# Patient Record
Sex: Male | Born: 1940
Health system: Southern US, Community
[De-identification: ages and names within clinical notes are randomized; demographics above are authoritative.]

## PROBLEM LIST (undated history)

## (undated) DIAGNOSIS — J189 Pneumonia, unspecified organism: Secondary | ICD-10-CM

## (undated) DIAGNOSIS — R270 Ataxia, unspecified: Secondary | ICD-10-CM

## (undated) DIAGNOSIS — K635 Polyp of colon: Secondary | ICD-10-CM

## (undated) DIAGNOSIS — M1990 Unspecified osteoarthritis, unspecified site: Secondary | ICD-10-CM

## (undated) DIAGNOSIS — R42 Dizziness and giddiness: Secondary | ICD-10-CM

## (undated) DIAGNOSIS — M1120 Other chondrocalcinosis, unspecified site: Secondary | ICD-10-CM

## (undated) DIAGNOSIS — K219 Gastro-esophageal reflux disease without esophagitis: Secondary | ICD-10-CM

## (undated) DIAGNOSIS — I2510 Atherosclerotic heart disease of native coronary artery without angina pectoris: Secondary | ICD-10-CM

## (undated) DIAGNOSIS — I10 Essential (primary) hypertension: Secondary | ICD-10-CM

## (undated) DIAGNOSIS — E7800 Pure hypercholesterolemia, unspecified: Secondary | ICD-10-CM

## (undated) DIAGNOSIS — R251 Tremor, unspecified: Secondary | ICD-10-CM

## (undated) DIAGNOSIS — G629 Polyneuropathy, unspecified: Secondary | ICD-10-CM

## (undated) DIAGNOSIS — I208 Other forms of angina pectoris: Secondary | ICD-10-CM

## (undated) DIAGNOSIS — C4491 Basal cell carcinoma of skin, unspecified: Secondary | ICD-10-CM

## (undated) DIAGNOSIS — G4730 Sleep apnea, unspecified: Secondary | ICD-10-CM

## (undated) DIAGNOSIS — I209 Angina pectoris, unspecified: Secondary | ICD-10-CM

## (undated) DIAGNOSIS — Z9289 Personal history of other medical treatment: Secondary | ICD-10-CM

## (undated) DIAGNOSIS — Z8719 Personal history of other diseases of the digestive system: Secondary | ICD-10-CM

## (undated) DIAGNOSIS — E119 Type 2 diabetes mellitus without complications: Secondary | ICD-10-CM

## (undated) HISTORY — PX: BACK SURGERY: SHX140

## (undated) HISTORY — DX: Personal history of other medical treatment: Z92.89

## (undated) HISTORY — PX: ELBOW SURGERY: SHX618

## (undated) HISTORY — PX: CATARACT EXTRACTION W/ INTRAOCULAR LENS IMPLANT: SHX1309

## (undated) HISTORY — PX: TRIGGER FINGER RELEASE: SHX641

## (undated) HISTORY — DX: Dizziness and giddiness: R42

## (undated) HISTORY — PX: CARPAL TUNNEL RELEASE: SHX101

## (undated) HISTORY — PX: EYE SURGERY: SHX253

## (undated) HISTORY — PX: BASAL CELL CARCINOMA EXCISION: SHX1214

## (undated) HISTORY — DX: Ataxia, unspecified: R27.0

## (undated) HISTORY — PX: COLONOSCOPY W/ POLYPECTOMY: SHX1380

---

## 1962-08-09 HISTORY — PX: POSTERIOR LUMBAR FUSION: SHX6036

## 1977-08-09 HISTORY — PX: NASAL SEPTUM SURGERY: SHX37

## 1998-06-23 ENCOUNTER — Ambulatory Visit (HOSPITAL_COMMUNITY): Admission: RE | Admit: 1998-06-23 | Discharge: 1998-06-23 | Payer: Self-pay | Admitting: Gastroenterology

## 1998-10-06 ENCOUNTER — Encounter: Admission: RE | Admit: 1998-10-06 | Discharge: 1999-01-04 | Payer: Self-pay | Admitting: Internal Medicine

## 1999-01-29 ENCOUNTER — Encounter: Admission: RE | Admit: 1999-01-29 | Discharge: 1999-04-29 | Payer: Self-pay | Admitting: Internal Medicine

## 2000-05-31 ENCOUNTER — Encounter: Admission: RE | Admit: 2000-05-31 | Discharge: 2000-07-07 | Payer: Self-pay | Admitting: Orthopedic Surgery

## 2002-08-15 ENCOUNTER — Encounter (HOSPITAL_BASED_OUTPATIENT_CLINIC_OR_DEPARTMENT_OTHER): Admission: RE | Admit: 2002-08-15 | Discharge: 2002-11-13 | Payer: Self-pay | Admitting: Internal Medicine

## 2003-09-04 ENCOUNTER — Ambulatory Visit (HOSPITAL_COMMUNITY): Admission: RE | Admit: 2003-09-04 | Discharge: 2003-09-04 | Payer: Self-pay | Admitting: Gastroenterology

## 2003-09-04 ENCOUNTER — Encounter (INDEPENDENT_AMBULATORY_CARE_PROVIDER_SITE_OTHER): Payer: Self-pay | Admitting: *Deleted

## 2005-02-06 HISTORY — PX: CARDIAC CATHETERIZATION: SHX172

## 2005-03-03 ENCOUNTER — Ambulatory Visit (HOSPITAL_COMMUNITY): Admission: RE | Admit: 2005-03-03 | Discharge: 2005-03-03 | Payer: Self-pay | Admitting: *Deleted

## 2005-06-14 ENCOUNTER — Encounter: Admission: RE | Admit: 2005-06-14 | Discharge: 2005-06-14 | Payer: Self-pay | Admitting: Internal Medicine

## 2006-07-13 ENCOUNTER — Ambulatory Visit: Payer: Self-pay | Admitting: Oncology

## 2006-07-15 ENCOUNTER — Ambulatory Visit: Payer: Self-pay | Admitting: Oncology

## 2006-07-18 LAB — MORPHOLOGY - CHCC SATELLITE: PLT EST ~~LOC~~: ADEQUATE

## 2006-07-18 LAB — CBC WITH DIFFERENTIAL/PLATELET
BASO%: 0.4 % (ref 0.0–2.0)
Basophils Absolute: 0 10*3/uL (ref 0.0–0.1)
EOS%: 1.3 % (ref 0.0–7.0)
Eosinophils Absolute: 0.1 10*3/uL (ref 0.0–0.5)
HCT: 54.3 % — ABNORMAL HIGH (ref 38.7–49.9)
HGB: 17.9 g/dL — ABNORMAL HIGH (ref 13.0–17.1)
LYMPH%: 22.4 % (ref 14.0–48.0)
MCH: 26.8 pg — ABNORMAL LOW (ref 28.0–33.4)
MCHC: 32.9 g/dL (ref 32.0–35.9)
MCV: 81.6 fL (ref 81.6–98.0)
MONO#: 0.5 10*3/uL (ref 0.1–0.9)
MONO%: 8.2 % (ref 0.0–13.0)
NEUT#: 4.4 10*3/uL (ref 1.5–6.5)
NEUT%: 67.7 % (ref 40.0–75.0)
Platelets: 194 10*3/uL (ref 145–400)
RBC: 6.65 10*6/uL — ABNORMAL HIGH (ref 4.20–5.71)
RDW: 17.2 % — ABNORMAL HIGH (ref 11.2–14.6)
WBC: 6.5 10*3/uL (ref 4.0–10.0)
lymph#: 1.4 10*3/uL (ref 0.9–3.3)

## 2006-07-21 ENCOUNTER — Ambulatory Visit (HOSPITAL_COMMUNITY): Admission: RE | Admit: 2006-07-21 | Discharge: 2006-07-21 | Payer: Self-pay | Admitting: Oncology

## 2006-07-22 LAB — COMPREHENSIVE METABOLIC PANEL
ALT: 28 U/L (ref 0–53)
AST: 32 U/L (ref 0–37)
Albumin: 4.7 g/dL (ref 3.5–5.2)
Alkaline Phosphatase: 58 U/L (ref 39–117)
BUN: 15 mg/dL (ref 6–23)
CO2: 21 mEq/L (ref 19–32)
Calcium: 9.6 mg/dL (ref 8.4–10.5)
Chloride: 97 mEq/L (ref 96–112)
Creatinine, Ser: 1.07 mg/dL (ref 0.40–1.50)
Glucose, Bld: 176 mg/dL — ABNORMAL HIGH (ref 70–99)
Potassium: 4.3 mEq/L (ref 3.5–5.3)
Sodium: 134 mEq/L — ABNORMAL LOW (ref 135–145)
Total Bilirubin: 0.8 mg/dL (ref 0.3–1.2)
Total Protein: 8.4 g/dL — ABNORMAL HIGH (ref 6.0–8.3)

## 2006-07-22 LAB — FERRITIN: Ferritin: 23 ng/mL (ref 22–322)

## 2006-07-22 LAB — IRON AND TIBC
%SAT: 20 % (ref 20–55)
Iron: 100 ug/dL (ref 42–165)
TIBC: 504 ug/dL — ABNORMAL HIGH (ref 215–435)
UIBC: 404 ug/dL

## 2006-07-22 LAB — RETICULOCYTES (CHCC)
ABS Retic: 60.5 10*3/uL (ref 19.0–186.0)
RBC.: 6.72 MIL/uL — ABNORMAL HIGH (ref 4.20–5.50)
Retic Ct Pct: 0.9 % (ref 0.4–3.1)

## 2006-07-22 LAB — HEMOCHROMATOSIS DNA-PCR(C282Y,H63D)

## 2006-07-22 LAB — LACTATE DEHYDROGENASE: LDH: 216 U/L (ref 94–250)

## 2006-07-22 LAB — VITAMIN B12: Vitamin B-12: 353 pg/mL (ref 211–911)

## 2006-07-22 LAB — ERYTHROPOIETIN: Erythropoietin: 7.9 m[IU]/mL (ref 2.6–34.0)

## 2006-07-22 LAB — LEUKOCYTE ALKALINE PHOS: Leukocyte Alkaline  Phos Stain: 127 — ABNORMAL HIGH (ref 22–124)

## 2006-07-28 LAB — CBC WITH DIFFERENTIAL (CANCER CENTER ONLY)
BASO#: 0 10*3/uL (ref 0.0–0.2)
BASO%: 0.7 % (ref 0.0–2.0)
EOS%: 2.1 % (ref 0.0–7.0)
Eosinophils Absolute: 0.1 10*3/uL (ref 0.0–0.5)
HCT: 52.9 % — ABNORMAL HIGH (ref 38.7–49.9)
HGB: 17.4 g/dL — ABNORMAL HIGH (ref 13.0–17.1)
LYMPH#: 1.8 10*3/uL (ref 0.9–3.3)
LYMPH%: 31.6 % (ref 14.0–48.0)
MCH: 27.2 pg — ABNORMAL LOW (ref 28.0–33.4)
MCHC: 32.8 g/dL (ref 32.0–35.9)
MCV: 83 fL (ref 82–98)
MONO#: 0.4 10*3/uL (ref 0.1–0.9)
MONO%: 7.2 % (ref 0.0–13.0)
NEUT#: 3.3 10*3/uL (ref 1.5–6.5)
NEUT%: 58.4 % (ref 40.0–80.0)
Platelets: 196 10*3/uL (ref 145–400)
RBC: 6.37 10*6/uL — ABNORMAL HIGH (ref 4.20–5.70)
RDW: 14.5 % (ref 10.5–14.6)
WBC: 5.7 10*3/uL (ref 4.0–10.0)

## 2006-07-29 LAB — HEMOGLOBINOPATHY EVALUATION
Hemoglobin Other: 0 % (ref 0.0–0.0)
Hgb A2 Quant: 2.4 % (ref 2.2–3.2)
Hgb A: 97.6 % (ref 96.8–97.8)
Hgb F Quant: 0 % (ref 0.0–0.5)
Hgb S Quant: 0 % (ref 0.0–0.0)

## 2006-08-30 ENCOUNTER — Ambulatory Visit (HOSPITAL_COMMUNITY): Admission: RE | Admit: 2006-08-30 | Discharge: 2006-08-30 | Payer: Self-pay | Admitting: Oncology

## 2006-08-30 ENCOUNTER — Encounter (INDEPENDENT_AMBULATORY_CARE_PROVIDER_SITE_OTHER): Payer: Self-pay | Admitting: Specialist

## 2006-08-31 ENCOUNTER — Ambulatory Visit: Payer: Self-pay | Admitting: Oncology

## 2006-09-01 LAB — CBC WITH DIFFERENTIAL (CANCER CENTER ONLY)
BASO#: 0.1 10*3/uL (ref 0.0–0.2)
BASO%: 0.9 % (ref 0.0–2.0)
EOS%: 2 % (ref 0.0–7.0)
Eosinophils Absolute: 0.1 10*3/uL (ref 0.0–0.5)
HCT: 50.6 % — ABNORMAL HIGH (ref 38.7–49.9)
HGB: 16.6 g/dL (ref 13.0–17.1)
LYMPH#: 1.7 10*3/uL (ref 0.9–3.3)
LYMPH%: 32.4 % (ref 14.0–48.0)
MCH: 27.5 pg — ABNORMAL LOW (ref 28.0–33.4)
MCHC: 32.7 g/dL (ref 32.0–35.9)
MCV: 84 fL (ref 82–98)
MONO#: 0.4 10*3/uL (ref 0.1–0.9)
MONO%: 8.1 % (ref 0.0–13.0)
NEUT#: 2.9 10*3/uL (ref 1.5–6.5)
NEUT%: 56.6 % (ref 40.0–80.0)
Platelets: 142 10*3/uL — ABNORMAL LOW (ref 145–400)
RBC: 6.02 10*6/uL — ABNORMAL HIGH (ref 4.20–5.70)
RDW: 15.5 % — ABNORMAL HIGH (ref 10.5–14.6)
WBC: 5.2 10*3/uL (ref 4.0–10.0)

## 2006-10-06 LAB — CBC WITH DIFFERENTIAL (CANCER CENTER ONLY)
BASO#: 0 10*3/uL (ref 0.0–0.2)
BASO%: 0.5 % (ref 0.0–2.0)
EOS%: 3.1 % (ref 0.0–7.0)
Eosinophils Absolute: 0.2 10*3/uL (ref 0.0–0.5)
HCT: 49.6 % (ref 38.7–49.9)
HGB: 16.2 g/dL (ref 13.0–17.1)
LYMPH#: 1.6 10*3/uL (ref 0.9–3.3)
LYMPH%: 27.7 % (ref 14.0–48.0)
MCH: 27.3 pg — ABNORMAL LOW (ref 28.0–33.4)
MCHC: 32.7 g/dL (ref 32.0–35.9)
MCV: 83 fL (ref 82–98)
MONO#: 0.4 10*3/uL (ref 0.1–0.9)
MONO%: 7 % (ref 0.0–13.0)
NEUT#: 3.5 10*3/uL (ref 1.5–6.5)
NEUT%: 61.7 % (ref 40.0–80.0)
Platelets: 193 10*3/uL (ref 145–400)
RBC: 5.95 10*6/uL — ABNORMAL HIGH (ref 4.20–5.70)
RDW: 15.5 % — ABNORMAL HIGH (ref 10.5–14.6)
WBC: 5.7 10*3/uL (ref 4.0–10.0)

## 2006-10-06 LAB — IRON AND TIBC
%SAT: 18 % — ABNORMAL LOW (ref 20–55)
Iron: 78 ug/dL (ref 42–165)
TIBC: 438 ug/dL — ABNORMAL HIGH (ref 215–435)
UIBC: 360 ug/dL

## 2006-10-06 LAB — FERRITIN: Ferritin: 104 ng/mL (ref 22–322)

## 2006-11-01 ENCOUNTER — Ambulatory Visit: Payer: Self-pay | Admitting: Oncology

## 2006-11-01 LAB — CBC WITH DIFFERENTIAL (CANCER CENTER ONLY)
BASO#: 0 10*3/uL (ref 0.0–0.2)
BASO%: 0.7 % (ref 0.0–2.0)
EOS%: 2.7 % (ref 0.0–7.0)
Eosinophils Absolute: 0.2 10*3/uL (ref 0.0–0.5)
HCT: 43.2 % (ref 38.7–49.9)
HGB: 14.2 g/dL (ref 13.0–17.1)
LYMPH#: 1.6 10*3/uL (ref 0.9–3.3)
LYMPH%: 25.5 % (ref 14.0–48.0)
MCH: 27.7 pg — ABNORMAL LOW (ref 28.0–33.4)
MCHC: 32.9 g/dL (ref 32.0–35.9)
MCV: 84 fL (ref 82–98)
MONO#: 0.5 10*3/uL (ref 0.1–0.9)
MONO%: 8 % (ref 0.0–13.0)
NEUT#: 3.9 10*3/uL (ref 1.5–6.5)
NEUT%: 63.1 % (ref 40.0–80.0)
Platelets: 210 10*3/uL (ref 145–400)
RBC: 5.12 10*6/uL (ref 4.20–5.70)
RDW: 15.8 % — ABNORMAL HIGH (ref 10.5–14.6)
WBC: 6.1 10*3/uL (ref 4.0–10.0)

## 2006-11-29 LAB — CBC WITH DIFFERENTIAL (CANCER CENTER ONLY)
BASO#: 0 10*3/uL (ref 0.0–0.2)
BASO%: 0.7 % (ref 0.0–2.0)
EOS%: 2.4 % (ref 0.0–7.0)
Eosinophils Absolute: 0.1 10*3/uL (ref 0.0–0.5)
HCT: 49.3 % (ref 38.7–49.9)
HGB: 16.1 g/dL (ref 13.0–17.1)
LYMPH#: 1.6 10*3/uL (ref 0.9–3.3)
LYMPH%: 28.1 % (ref 14.0–48.0)
MCH: 27.4 pg — ABNORMAL LOW (ref 28.0–33.4)
MCHC: 32.6 g/dL (ref 32.0–35.9)
MCV: 84 fL (ref 82–98)
MONO#: 0.5 10*3/uL (ref 0.1–0.9)
MONO%: 8.1 % (ref 0.0–13.0)
NEUT#: 3.4 10*3/uL (ref 1.5–6.5)
NEUT%: 60.7 % (ref 40.0–80.0)
Platelets: 250 10*3/uL (ref 145–400)
RBC: 5.87 10*6/uL — ABNORMAL HIGH (ref 4.20–5.70)
RDW: 14.1 % (ref 10.5–14.6)
WBC: 5.6 10*3/uL (ref 4.0–10.0)

## 2007-01-23 ENCOUNTER — Ambulatory Visit: Payer: Self-pay | Admitting: Oncology

## 2007-01-24 LAB — CBC WITH DIFFERENTIAL (CANCER CENTER ONLY)
BASO#: 0.1 10*3/uL (ref 0.0–0.2)
BASO%: 0.9 % (ref 0.0–2.0)
EOS%: 2.3 % (ref 0.0–7.0)
Eosinophils Absolute: 0.1 10*3/uL (ref 0.0–0.5)
HCT: 48.2 % (ref 38.7–49.9)
HGB: 15.9 g/dL (ref 13.0–17.1)
LYMPH#: 1.5 10*3/uL (ref 0.9–3.3)
LYMPH%: 27.4 % (ref 14.0–48.0)
MCH: 26.3 pg — ABNORMAL LOW (ref 28.0–33.4)
MCHC: 33 g/dL (ref 32.0–35.9)
MCV: 80 fL — ABNORMAL LOW (ref 82–98)
MONO#: 0.5 10*3/uL (ref 0.1–0.9)
MONO%: 8.5 % (ref 0.0–13.0)
NEUT#: 3.4 10*3/uL (ref 1.5–6.5)
NEUT%: 60.9 % (ref 40.0–80.0)
Platelets: 254 10*3/uL (ref 145–400)
RBC: 6.05 10*6/uL — ABNORMAL HIGH (ref 4.20–5.70)
RDW: 13.6 % (ref 10.5–14.6)
WBC: 5.6 10*3/uL (ref 4.0–10.0)

## 2007-01-24 LAB — IRON AND TIBC
%SAT: 14 % — ABNORMAL LOW (ref 20–55)
Iron: 66 ug/dL (ref 42–165)
TIBC: 481 ug/dL — ABNORMAL HIGH (ref 215–435)
UIBC: 415 ug/dL

## 2007-01-24 LAB — FERRITIN: Ferritin: 42 ng/mL (ref 22–322)

## 2007-04-17 ENCOUNTER — Ambulatory Visit: Payer: Self-pay | Admitting: Oncology

## 2007-11-12 ENCOUNTER — Encounter: Admission: RE | Admit: 2007-11-12 | Discharge: 2007-11-12 | Payer: Self-pay | Admitting: Internal Medicine

## 2008-10-14 ENCOUNTER — Encounter (INDEPENDENT_AMBULATORY_CARE_PROVIDER_SITE_OTHER): Payer: Self-pay | Admitting: Internal Medicine

## 2008-10-16 ENCOUNTER — Ambulatory Visit: Payer: Self-pay | Admitting: *Deleted

## 2008-10-16 DIAGNOSIS — IMO0001 Reserved for inherently not codable concepts without codable children: Secondary | ICD-10-CM | POA: Insufficient documentation

## 2008-10-16 DIAGNOSIS — Z794 Long term (current) use of insulin: Secondary | ICD-10-CM

## 2008-10-16 DIAGNOSIS — E119 Type 2 diabetes mellitus without complications: Secondary | ICD-10-CM

## 2008-10-16 LAB — CONVERTED CEMR LAB: Blood Glucose, Home Monitor: 2 mg/dL

## 2008-10-23 ENCOUNTER — Telehealth (INDEPENDENT_AMBULATORY_CARE_PROVIDER_SITE_OTHER): Payer: Self-pay | Admitting: *Deleted

## 2008-11-07 ENCOUNTER — Ambulatory Visit: Payer: Self-pay | Admitting: Internal Medicine

## 2008-11-27 ENCOUNTER — Telehealth (INDEPENDENT_AMBULATORY_CARE_PROVIDER_SITE_OTHER): Payer: Self-pay | Admitting: *Deleted

## 2008-12-05 ENCOUNTER — Telehealth (INDEPENDENT_AMBULATORY_CARE_PROVIDER_SITE_OTHER): Payer: Self-pay | Admitting: *Deleted

## 2009-01-15 ENCOUNTER — Ambulatory Visit: Payer: Self-pay | Admitting: Internal Medicine

## 2009-01-28 ENCOUNTER — Telehealth (INDEPENDENT_AMBULATORY_CARE_PROVIDER_SITE_OTHER): Payer: Self-pay | Admitting: *Deleted

## 2009-02-18 ENCOUNTER — Telehealth (INDEPENDENT_AMBULATORY_CARE_PROVIDER_SITE_OTHER): Payer: Self-pay | Admitting: *Deleted

## 2009-03-12 ENCOUNTER — Telehealth (INDEPENDENT_AMBULATORY_CARE_PROVIDER_SITE_OTHER): Payer: Self-pay | Admitting: *Deleted

## 2009-03-19 ENCOUNTER — Encounter: Payer: Self-pay | Admitting: Internal Medicine

## 2009-03-19 ENCOUNTER — Ambulatory Visit: Payer: Self-pay | Admitting: Internal Medicine

## 2009-03-24 ENCOUNTER — Telehealth (INDEPENDENT_AMBULATORY_CARE_PROVIDER_SITE_OTHER): Payer: Self-pay | Admitting: *Deleted

## 2009-04-21 ENCOUNTER — Telehealth (INDEPENDENT_AMBULATORY_CARE_PROVIDER_SITE_OTHER): Payer: Self-pay | Admitting: *Deleted

## 2009-05-01 ENCOUNTER — Encounter: Payer: Self-pay | Admitting: Internal Medicine

## 2009-09-18 ENCOUNTER — Encounter: Payer: Self-pay | Admitting: Cardiology

## 2009-10-07 ENCOUNTER — Ambulatory Visit (HOSPITAL_BASED_OUTPATIENT_CLINIC_OR_DEPARTMENT_OTHER): Admission: RE | Admit: 2009-10-07 | Discharge: 2009-10-07 | Payer: Self-pay | Admitting: Orthopedic Surgery

## 2009-10-07 HISTORY — PX: CARPAL TUNNEL RELEASE: SHX101

## 2010-10-28 LAB — BASIC METABOLIC PANEL
BUN: 11 mg/dL (ref 6–23)
CO2: 28 mEq/L (ref 19–32)
Calcium: 10.3 mg/dL (ref 8.4–10.5)
Chloride: 100 mEq/L (ref 96–112)
Creatinine, Ser: 1.04 mg/dL (ref 0.4–1.5)
GFR calc Af Amer: >60 mL/min (ref >60)
GFR calc non Af Amer: >60 mL/min (ref >60)
Glucose, Bld: 126 mg/dL — ABNORMAL HIGH (ref 70–99)
Potassium: 5.3 mEq/L — ABNORMAL HIGH (ref 3.5–5.1)
Sodium: 135 mEq/L (ref 135–145)

## 2010-11-02 LAB — GLUCOSE, CAPILLARY
Glucose-Capillary: 115 mg/dL — ABNORMAL HIGH (ref 70–99)
Glucose-Capillary: 138 mg/dL — ABNORMAL HIGH (ref 70–99)

## 2010-11-02 LAB — POCT HEMOGLOBIN-HEMACUE: Hemoglobin: 15.9 g/dL (ref 13.0–17.0)

## 2010-12-25 NOTE — Cardiovascular Report (Signed)
NAMEREASON, HELZER NO.:  [id number]   MEDICAL RECORD NO.:  [id number]          PATIENT TYPE:  OIB   LOCATION:  2899                         FACILITY:  [facility]   PHYSICIAN:  Elmore Guise., M.D.DATE OF BIRTH:  1940-11-24   DATE OF PROCEDURE:  03/03/2005  DATE OF DISCHARGE:                              CARDIAC CATHETERIZATION   INDICATIONS FOR PROCEDURE:  Abnormal stress test.   DESCRIPTION OF PROCEDURE:  The patient was brought to the cardiac cath lab  after appropriate informed consent. He is prepped and draped in sterile  fashion. Approximately 15 mL of 1% lidocaine was used for local anesthesia.  A 6-French sheath was placed in the right femoral artery without difficulty.  Coronary angiography, LV angiography and limited right femoral angiography  were then performed. The patient tolerated procedure well. He did have  appropriate arteriotomy site for AngioSeal closure device. AngioSeal closure  device was then deployed successfully. There was no bleeding.   FINDINGS:  1.  Left main: Normal.  2.  LAD: Proximal 20% stenosis with diffuse mild mid and distal luminal      irregularities.  3.  Left circumflex: Dominant with no significant disease.  4.  High OM-1: Has mild luminal irregularities moderate-to-large size      vessel.  5.  OM-2: Small vessel with mild luminal irregularities.  6.  OM-3, OM-4: Moderate size vessels with no significant disease.  7.  RCA: Nondominant high branching small vessel with moderate to diffuse      disease.  8.  LV: EF 55-60%, no wall motion abnormalities. LVEDP was 10 mmHg.  9.  Limited right femoral angiogram showed no significant femoral disease,      appropriate arteriotomy site for AngioSeal deployment. AngioSeal closure      device was deployed successfully.   IMPRESSION:  1.  Nonobstructive coronary arteries as described above.  2.  Preserved left ventricular systolic function with an ejection fraction      of  55-60%. No wall motion abnormalities.   PLAN:  Aggressive risk factor modification including blood pressure, lipid,  and diabetic control.       TWK/MEDQ  D:  03/03/2005  T:  03/03/2005  Job:  [id number]   cc:   Cassell Clement, M.D.  [address], [address]  [place]  [place] [address]  Fax: [phone]   Geoffry Paradise, M.D.  [address]  [place]  [place] [address]  Fax: [phone]

## 2010-12-25 NOTE — H&P (Signed)
NAMEALUCARD, FEARNOW NO.:  [id number]   MEDICAL RECORD NO.:  [id number]         PATIENT TYPE:  COIB   LOCATION:                               FACILITY:  [facility]   PHYSICIAN:  Elmore Guise., M.D.DATE OF BIRTH:  1941/04/03   DATE OF ADMISSION:  03/03/2005  DATE OF DISCHARGE:                                HISTORY & PHYSICAL   PRIMARY CARE PHYSICIAN:  Dr. Geoffry Paradise   HISTORY OF PRESENT ILLNESS:  The patient is very a pleasant 70 year old  male, past medical history of diabetes mellitus, dyslipidemia, hypertension,  and family history of heart disease who presents for evaluation of abnormal  stress Cardiolite.  Patient has no significant complaints.  He reports  occasional gas-like sensation in his lower chest area that comes and goes,  but no significant angina.  He stays very active with exercising four to  five days per week with walking and swimming.  He denies any significant  shortness of breath or changes with his exertional tolerance.  He underwent  stress Cardiolite on February 25, 2005 exercising via Bruce protocol for 6  minutes and 30 seconds achieving a peak heart rate of 156 beats per minute  corresponding to 91% of age-predicted maximum.  He did have hypertensive  blood pressure response was systolic blood pressure increasing to 210 and  had moderate dyspnea at peak stress.  He had nonspecific ST-T wave changes,  but no definite ischemic changes.  His EF was 48% and he had mild transient  ischemic make dilatation noted between stress and rest images.  There was no  significant evidence of focal ischemia noted.  However, due to the transient  ischemic dilatation and drop in EF from 57% down to 48% patient now presents  for discussion of invasive workup.  Review of systems is positive for mild  weight gain and occasional palpitations that come and go.  He was recently  stopped from his lisinopril secondary to a lightheaded feeling when he would  stand up.  This is now resolved.  He has no orthopnea, no PND, no  presyncope, and no significant lower extremity edema.  All other review of  systems are negative.   CURRENT MEDICATIONS:  1.  Glucophage 2 g b.i.d.  2.  Lantus 30 units q.h.s.  3.  Lipitor 10 mg daily.  4.  Celebrex 200 mg daily.  5.  Avandia 4 mg daily.  6.  Byetta 10 mg daily.  7.  Lumigan one drip daily.  8.  Azopt eye drops one drip b.i.d.  9.  Testosterone injections q.3 weeks.   ALLERGIES:  MORPHINE causing itching and NAPROSYN causing tremors and  hallucination.   FAMILY HISTORY:  Positive for heart disease in his father at age 47.  His  mother had diabetes.  He has four brothers and five sisters all with  hypertension and diabetes.   SOCIAL HISTORY:  He is married.  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  Exercises  four to five times per week either walking on a treadmill or swimming.  He  smoked off-and-on  for 10 years and quit more than 15 years ago.  No alcohol  and approximately two caffeinated beverages daily.   PAST SURGICAL HISTORY:  Back surgery in 1964, right wrist carpal tunnel  surgery, and a nose surgery back 20-25 years ago.   PHYSICAL EXAMINATION:  VITAL SIGNS:  Weight is 271 pounds, blood pressure is  156/90, heart rate is 80 and regular.  GENERAL:  He is a very pleasant, middle-aged male, alert and oriented x4 in  no acute distress.  HEENT:  Normal.  NECK:  Supple.  No lymphadenopathy.  2+ carotids.  No JVD and no bruits.  LUNGS:  Clear.  HEART:  Regular with a normal S1-S2.  No S3 or S4 noted.  ABDOMEN:  Soft, nontender, nondistended.  EXTREMITIES:  Warm with 2+ pulses and no edema.  He has 2+ pulses in his  carotid, brachial, radial, femoral, pedal.   He his EKG done February 25, 2005 showed normal sinus rhythm, normal axis normal  intervals with no significant ST or T-wave changes   IMPRESSION:  1.  Abnormal stress test with transient ischemic dilatation and hypertensive      blood  pressure response.  2.  Multiple cardiac risk factors including hypertension, dyslipidemia,      diabetes.   PLAN:  We discussed plus/minuses of invasive work-up at length.  Patient  agrees to proceed.  We will check CBC, BMP, and coag panel prior to the  procedure.  I have asked him to stop his Glucophage starting 24 hours prior  to the procedure and then holding for 48 hours after the procedure.  He is  to half his Lantus dose the day prior to the procedure.  Procedure was  scheduled for March 04, 2005 as catheterization with possible intervention if  needed.       TWK/MEDQ  D:  03/01/2005  T:  03/01/2005  Job:  [id number]   cc:   Geoffry Paradise, M.D.  [address]  [place]  [place] [address]  Fax: [phone]

## 2010-12-25 NOTE — Consult Note (Signed)
[facility]  Patient:    Seth Young, Seth Young                       MRN: [id number] Adm. Date:  [id number] Attending:  Garlan Fillers CC:         Richard A. Jacky Kindle, M.D.   Consultation Report  REFERRAL SOURCE:  Self.  PRIMARY PHYSICIAN:  Dr. Geoffry Paradise.  HISTORY OF PRESENT ILLNESS:  The patient is a 70 year old black male who was self-referred for evaluation of occasional numbness in the plantar and lateral surfaces of both feet.  He denies a history of foot ulceration.  He has a history of diabetes mellitus type 2 since 1981, which has not been complicated by retinopathy, peripheral neuropathy, peripheral vascular disease, or coronary artery disease.  He also has a history of glaucoma and plantar fasciitis, for which he wears heel cups.  PAST SURGICAL HISTORY: 1. In 1964, lumbar spine disk surgery. 2. Bilateral carpal tunnel release.  ALLERGIES:  NAPROSYN and MORPHINE SULFATE.  MEDICATIONS PRIOR TO ADMISSION: 1. Glucophage 1000 mg p.o. b.i.d. 2. Zestril 5 mg 1/2 tablet p.o. q.a.m. 3. Vioxx 25 mg p.o. q.d. 4. Advil p.r.n. pain. 5. Lantus 30-35 units subcutaneous q.h.s. 6. Xalatan 1 drop in each eye at bedtime. 7. Viagra 100 mg p.o. q.d. p.r.n. 8. Avandia 1 tablet once daily.  PHYSICAL EXAMINATION:  The temperatures of the feet are 87.3 and 87.9 degrees Fahrenheit along the dorsal aspects of the right and left feet respectively. Temperatures are 83.7 and 83.0 degrees along the plantar aspects of the right and left feet respectively.  The pedal pulses are normal bilaterally.  In addition, the overall shape of both feet is normal.  He has intact sensation to 5.07 weight monofilament pressure.  There is no evidence of interdigital pain in his feet.  In addition, there is no evidence of heavy callus formation or ulceration.  IMPRESSION: 1. Bilateral foot numbness without clear evidence of diabetic peripheral    neuropathy. 2. Diabetes  mellitus type 2, controlled. 3. Glaucoma. 4. Plantar fasciitis. 5. Lichen planus. 6. Degenerative disk disease in the lumbar spine.  PLAN: 1. He was given a prescription to obtain custom inserts for both feet to    better distribute pressure and prevent callus formation.  This may also    help with the numbness that he describes. 2. He was advised to continue regular exercise. 3. He was given videotape and written instructions regarding proper diabetic    foot hygiene. 4. He was advised to apply bag balm to both feet daily to maintain good skin    moisturization. 5. He was advised to call for a follow-up appointment in the foot clinic as    needed in the future. DD:  07/06/00 TD:  07/06/00 Job: [id number]

## 2010-12-25 NOTE — Op Note (Signed)
NAME:  Seth Young, Seth Young                          ACCOUNT NO.:  [id number]   MEDICAL RECORD NO.:  [id number]                   PATIENT TYPE:  AMB   LOCATION:  ENDO                                 FACILITY:  [facility]   PHYSICIAN:  Petra Kuba, M.D.                 DATE OF BIRTH:  [birth year]   DATE OF PROCEDURE:  09/04/2003  DATE OF DISCHARGE:                                 OPERATIVE REPORT   PROCEDURE:  Colonoscopy with hot biopsy.   INDICATIONS FOR PROCEDURE:  Patient with a history of adenomatous polyps due  for repeat screening.  Consent was signed after risks, benefits, methods,  and options were thoroughly discussed in the office.   MEDICATIONS USED:  Demerol 70, Versed 7.   PROCEDURE:  Rectal inspection was pertinent for small external hemorrhoids.  Digital exam was negative.  The video colonoscope was inserted and easily  advanced to the proximal level of the splenic flexure.  At that point, a  sessile polyp along the fold was seen.  We did try to snare it but it kept  sliding off, so we elected to advance to the cecum which did require some  abdominal pressure but no position changes and no other abnormality was seen  on insertion.  The cecum was identified by the appendiceal orifice and the  ileocecal valve.  The prep was adequate.  There was some liquid stool that  required washing and suctioning.  The prep on the left was better than the  right and adequate visualization was had with washing and suctioning.  On  slow withdrawal through the colon, the cecum and the ascending were normal.  In the mid transverse, a tiny polyp was seen which was hot biopsied.  The  scope was slowly withdrawn around the left side of the colon. Initially, we  did not find the polyp that was seen on insertion but when we withdrew back  to about 20 cm, we readvanced up the left side and thought we saw a similar  looking trauma that seemed to have a little trauma on it from probably the  snare so we  were comfortable that this was the same polyp.  Three hot  biopsies were obtained and put in a separate container.  The scope was  readvanced to the proximal level of the hepatic flexure.  No additional  findings were seen in the transverse.  The scope was again withdrawn back to  the rectum.  Once back in the rectum, anorectal pull through and  retroflexion confirmed some hemorrhoids.  No additional findings were seen.  The scope was reinserted a short ways up the left side of the colon, air was  suctioned, the scope was removed.  The patient tolerated the procedure well.  There was no obvious complications.   ENDOSCOPIC DIAGNOSIS:  1. Internal and external hemorrhoids.  2. Small splenic flexure sessile polyp unable to  snare on insertion, hot     biopsied on withdrawal.  3. Tiny mid transverse polyp hot biopsied.  4. Otherwise, within normal limits to the cecum.   PLAN:  Await pathology to determine future colonic screening.  Happy to see  back p.r.n..  Otherwise, return to the care of Dr. Fredia Beets for customary  health care maintenance to include yearly rectals and guaiacs.                                               Petra Kuba, M.D.    MEM/MEDQ  D:  09/04/2003  T:  09/04/2003  Job:  [id number]   cc:   Gerlene Burdock A. Eulis Canner.D.

## 2011-05-10 HISTORY — PX: KNEE ARTHROSCOPY: SHX127

## 2011-10-08 ENCOUNTER — Other Ambulatory Visit: Payer: Self-pay | Admitting: Otolaryngology

## 2011-10-11 ENCOUNTER — Encounter (HOSPITAL_COMMUNITY): Payer: Self-pay | Admitting: Pharmacy Technician

## 2011-10-12 ENCOUNTER — Encounter (HOSPITAL_COMMUNITY): Payer: Self-pay

## 2011-10-12 ENCOUNTER — Encounter (HOSPITAL_COMMUNITY)
Admission: RE | Admit: 2011-10-12 | Discharge: 2011-10-12 | Disposition: A | Discharge status: Home / Self Care | Payer: Medicare Other | Source: Ambulatory Visit | Attending: Specialist | Admitting: Specialist

## 2011-10-12 DIAGNOSIS — K635 Polyp of colon: Secondary | ICD-10-CM

## 2011-10-12 HISTORY — DX: Polyp of colon: K63.5

## 2011-10-12 HISTORY — DX: Sleep apnea, unspecified: G47.30

## 2011-10-12 HISTORY — DX: Unspecified osteoarthritis, unspecified site: M19.90

## 2011-10-12 HISTORY — DX: Personal history of other diseases of the digestive system: Z87.19

## 2011-10-12 HISTORY — DX: Essential (primary) hypertension: I10

## 2011-10-12 LAB — BASIC METABOLIC PANEL
CO2: 26 mEq/L (ref 19–32)
Calcium: 10.3 mg/dL (ref 8.4–10.5)
Glucose, Bld: 142 mg/dL — ABNORMAL HIGH (ref 70–99)
Potassium: 4.5 mEq/L (ref 3.5–5.1)
Sodium: 133 mEq/L — ABNORMAL LOW (ref 135–145)

## 2011-10-12 LAB — CBC
HCT: 45.9 % (ref 39.0–52.0)
Hemoglobin: 15.1 g/dL (ref 13.0–17.0)
MCH: 28.8 pg (ref 26.0–34.0)
RBC: 5.24 MIL/uL (ref 4.22–5.81)

## 2011-10-12 LAB — SURGICAL PCR SCREEN: MRSA, PCR: NEGATIVE

## 2011-10-12 MED ORDER — CHLORHEXIDINE GLUCONATE 4 % EX LIQD
60.0000 mL | Freq: Once | CUTANEOUS | Status: DC
  Filled 2011-10-12: qty 60

## 2011-10-12 NOTE — Pre-Procedure Instructions (Signed)
10-12-11- EKG/ CXR 10'12 reports with chart.

## 2011-10-12 NOTE — Patient Instructions (Signed)
20 DEMARKO ZEIMET  10/12/2011   Your procedure is scheduled on: 10-14-11  Report to [facility] at 0900       AM.  Call this number if you have problems the morning of surgery: [phone]   Remember:   Do not eat food:After Midnight.  May have clear liquids:up to 6 Hours before arrival. Nothing after : 0430 AM, unless drop in blood sugar(use 2-4 ounces of clear liquid with tablespoon of sugar)-report in to Short Stay unit.  Clear liquids include soda, tea, black coffee, apple or grape juice, broth.  Take these medicines the morning of surgery with A SIP OF WATER: Amlodipine, Cymbalta, Lipitor. Bringabd use Cosopt eye drops. No Diabetic meds or insulin Am of .-Lantus (1/2) night time dose -25 units Subcutaneous  10-13-11 , then none AM of.   Do not wear jewelry, make-up or nail polish.  Do not wear lotions, powders, or perfumes. You may wear deodorant.  Do not shave 48 hours prior to surgery.(may shave face and neck0  Do not bring valuables to the hospital.  Contacts, dentures or bridgework may not be worn into surgery.  Leave suitcase in the car. After surgery it may be brought to your room.  For patients admitted to the hospital, checkout time is 11:00 AM the day of discharge.   Patients discharged the day of surgery will not be allowed to drive home.  Name and phone number of your driver: [name]-spouse  Special Instructions: CHG Shower Use Special Wash: 1/2 bottle night before surgery and 1/2 bottle morning of surgery.(avoid face and genitals)   Please read over the following fact sheets that you were given: MRSA Information, Incentive Spirometry Instruction.

## 2011-10-14 ENCOUNTER — Encounter (HOSPITAL_COMMUNITY): Payer: Self-pay | Admitting: Anesthesiology

## 2011-10-14 ENCOUNTER — Encounter (HOSPITAL_COMMUNITY): Payer: Self-pay | Admitting: *Deleted

## 2011-10-14 ENCOUNTER — Encounter (HOSPITAL_COMMUNITY)
Admission: RE | Disposition: A | Discharge status: Home / Self Care | Payer: Self-pay | Source: Ambulatory Visit | Attending: Specialist

## 2011-10-14 ENCOUNTER — Ambulatory Visit (HOSPITAL_COMMUNITY): Payer: Medicare Other | Admitting: Anesthesiology

## 2011-10-14 ENCOUNTER — Observation Stay (HOSPITAL_COMMUNITY)
Admission: RE | Admit: 2011-10-14 | Discharge: 2011-10-17 | Disposition: A | Discharge status: Home / Self Care | Payer: Medicare Other | Source: Ambulatory Visit | Attending: Specialist | Admitting: Specialist

## 2011-10-14 DIAGNOSIS — K449 Diaphragmatic hernia without obstruction or gangrene: Secondary | ICD-10-CM | POA: Insufficient documentation

## 2011-10-14 DIAGNOSIS — S43429A Sprain of unspecified rotator cuff capsule, initial encounter: Principal | ICD-10-CM | POA: Insufficient documentation

## 2011-10-14 DIAGNOSIS — X58XXXA Exposure to other specified factors, initial encounter: Secondary | ICD-10-CM | POA: Insufficient documentation

## 2011-10-14 DIAGNOSIS — G4730 Sleep apnea, unspecified: Secondary | ICD-10-CM | POA: Insufficient documentation

## 2011-10-14 DIAGNOSIS — M75100 Unspecified rotator cuff tear or rupture of unspecified shoulder, not specified as traumatic: Secondary | ICD-10-CM

## 2011-10-14 DIAGNOSIS — M25419 Effusion, unspecified shoulder: Secondary | ICD-10-CM | POA: Insufficient documentation

## 2011-10-14 DIAGNOSIS — I10 Essential (primary) hypertension: Secondary | ICD-10-CM | POA: Insufficient documentation

## 2011-10-14 DIAGNOSIS — Z01812 Encounter for preprocedural laboratory examination: Secondary | ICD-10-CM | POA: Insufficient documentation

## 2011-10-14 DIAGNOSIS — E119 Type 2 diabetes mellitus without complications: Secondary | ICD-10-CM | POA: Insufficient documentation

## 2011-10-14 HISTORY — PX: SUBACROMIAL DECOMPRESSION: SHX5174

## 2011-10-14 HISTORY — PX: SHOULDER OPEN ROTATOR CUFF REPAIR: SHX2407

## 2011-10-14 LAB — GLUCOSE, CAPILLARY
Glucose-Capillary: 148 mg/dL — ABNORMAL HIGH (ref 70–99)
Glucose-Capillary: 156 mg/dL — ABNORMAL HIGH (ref 70–99)

## 2011-10-14 SURGERY — REPAIR, ROTATOR CUFF, OPEN
Anesthesia: General | Site: Shoulder | Laterality: Right | Wound class: Clean

## 2011-10-14 MED ORDER — DOCUSATE SODIUM 100 MG PO CAPS
100.0000 mg | ORAL_CAPSULE | Freq: Two times a day (BID) | ORAL | Status: DC
  Administered 2011-10-14 – 2011-10-17 (×6): 100 mg via ORAL
  Filled 2011-10-14 (×5): qty 1

## 2011-10-14 MED ORDER — TOBRAMYCIN 0.3 % OP SOLN
1.0000 [drp] | Freq: Four times a day (QID) | OPHTHALMIC | Status: DC
  Filled 2011-10-14: qty 5

## 2011-10-14 MED ORDER — BUPIVACAINE-EPINEPHRINE 0.5% -1:200000 IJ SOLN
INTRAMUSCULAR | Status: DC | PRN
  Administered 2011-10-14: 25 mL

## 2011-10-14 MED ORDER — L-METHYLFOLATE-B6-B12 3-35-2 MG PO TABS
1.0000 | ORAL_TABLET | Freq: Every day | ORAL | Status: DC
  Administered 2011-10-15 – 2011-10-17 (×3): 1 via ORAL
  Filled 2011-10-14 (×3): qty 1

## 2011-10-14 MED ORDER — ONDANSETRON HCL 4 MG/2ML IJ SOLN
4.0000 mg | Freq: Four times a day (QID) | INTRAMUSCULAR | Status: DC | PRN
  Filled 2011-10-14: qty 2

## 2011-10-14 MED ORDER — FENTANYL CITRATE 0.05 MG/ML IJ SOLN: 25.0000 ug | INTRAMUSCULAR | Status: DC | PRN

## 2011-10-14 MED ORDER — MEPERIDINE HCL 50 MG/ML IJ SOLN: 6.2500 mg | INTRAMUSCULAR | Status: DC | PRN

## 2011-10-14 MED ORDER — ACETAMINOPHEN 10 MG/ML IV SOLN
INTRAVENOUS | Status: AC
  Filled 2011-10-14: qty 100

## 2011-10-14 MED ORDER — ONDANSETRON HCL 4 MG/2ML IJ SOLN
INTRAMUSCULAR | Status: DC | PRN
  Administered 2011-10-14: 4 mg via INTRAVENOUS

## 2011-10-14 MED ORDER — AMLODIPINE BESYLATE 5 MG PO TABS
5.0000 mg | ORAL_TABLET | Freq: Every day | ORAL | Status: DC
  Administered 2011-10-16 – 2011-10-17 (×2): 5 mg via ORAL
  Filled 2011-10-14 (×3): qty 1

## 2011-10-14 MED ORDER — INSULIN ASPART 100 UNIT/ML ~~LOC~~ SOLN: 0.0000 [IU] | Freq: Every day | SUBCUTANEOUS | Status: DC

## 2011-10-14 MED ORDER — ACETAMINOPHEN 10 MG/ML IV SOLN
INTRAVENOUS | Status: DC | PRN
  Administered 2011-10-14: 1000 mg via INTRAVENOUS

## 2011-10-14 MED ORDER — HYDROMORPHONE HCL PF 1 MG/ML IJ SOLN
0.5000 mg | INTRAMUSCULAR | Status: DC | PRN
  Administered 2011-10-14 – 2011-10-15 (×3): 1 mg via INTRAVENOUS
  Administered 2011-10-15: 0.5 mg via INTRAVENOUS
  Administered 2011-10-15: 1 mg via INTRAVENOUS
  Filled 2011-10-14 (×5): qty 1

## 2011-10-14 MED ORDER — MIDAZOLAM HCL 5 MG/5ML IJ SOLN
INTRAMUSCULAR | Status: DC | PRN
  Administered 2011-10-14: 2 mg via INTRAVENOUS

## 2011-10-14 MED ORDER — SODIUM CHLORIDE 0.9 % IV SOLN
INTRAVENOUS | Status: DC
  Administered 2011-10-14 – 2011-10-15 (×2): via INTRAVENOUS

## 2011-10-14 MED ORDER — DULOXETINE HCL 30 MG PO CPEP
30.0000 mg | ORAL_CAPSULE | Freq: Every day | ORAL | Status: DC
  Administered 2011-10-15 – 2011-10-17 (×3): 30 mg via ORAL
  Filled 2011-10-14 (×3): qty 1

## 2011-10-14 MED ORDER — SODIUM CHLORIDE 0.9 % IR SOLN
Status: DC | PRN
  Administered 2011-10-14: 11:00:00

## 2011-10-14 MED ORDER — INSULIN LISPRO 100 UNIT/ML ~~LOC~~ SOLN: 5.0000 [IU] | Freq: Three times a day (TID) | SUBCUTANEOUS | Status: DC

## 2011-10-14 MED ORDER — METFORMIN HCL 500 MG PO TABS
1000.0000 mg | ORAL_TABLET | Freq: Two times a day (BID) | ORAL | Status: DC
  Administered 2011-10-15 – 2011-10-17 (×5): 1000 mg via ORAL
  Filled 2011-10-14 (×5): qty 2

## 2011-10-14 MED ORDER — PHENYLEPHRINE HCL 10 MG/ML IJ SOLN
10.0000 mg | INTRAVENOUS | Status: DC | PRN
  Administered 2011-10-14: 50 ug/min via INTRAVENOUS

## 2011-10-14 MED ORDER — METOCLOPRAMIDE HCL 10 MG PO TABS
5.0000 mg | ORAL_TABLET | Freq: Three times a day (TID) | ORAL | Status: DC | PRN
  Administered 2011-10-16: 10 mg via ORAL
  Filled 2011-10-14: qty 1

## 2011-10-14 MED ORDER — PHENOL 1.4 % MT LIQD
1.0000 | OROMUCOSAL | Status: DC | PRN
  Filled 2011-10-14: qty 177

## 2011-10-14 MED ORDER — GLYCOPYRROLATE 0.2 MG/ML IJ SOLN
INTRAMUSCULAR | Status: DC | PRN
  Administered 2011-10-14: .7 mg via INTRAVENOUS

## 2011-10-14 MED ORDER — PROPOFOL 10 MG/ML IV EMUL
INTRAVENOUS | Status: DC | PRN
  Administered 2011-10-14: 150 mg via INTRAVENOUS

## 2011-10-14 MED ORDER — LACTATED RINGERS IV SOLN
INTRAVENOUS | Status: DC
  Administered 2011-10-14: 13:00:00 via INTRAVENOUS

## 2011-10-14 MED ORDER — PROMETHAZINE HCL 25 MG/ML IJ SOLN: 6.2500 mg | INTRAMUSCULAR | Status: DC | PRN

## 2011-10-14 MED ORDER — METHOCARBAMOL 100 MG/ML IJ SOLN
500.0000 mg | Freq: Four times a day (QID) | INTRAMUSCULAR | Status: DC | PRN
  Filled 2011-10-14: qty 5

## 2011-10-14 MED ORDER — LIRAGLUTIDE 18 MG/3ML ~~LOC~~ SOLN
1.8000 mg | Freq: Every day | SUBCUTANEOUS | Status: DC
  Administered 2011-10-15 – 2011-10-17 (×3): 1.8 mg via SUBCUTANEOUS

## 2011-10-14 MED ORDER — PREGABALIN 50 MG PO CAPS
50.0000 mg | ORAL_CAPSULE | Freq: Every day | ORAL | Status: DC
  Administered 2011-10-14 – 2011-10-16 (×3): 50 mg via ORAL
  Filled 2011-10-14 (×3): qty 1

## 2011-10-14 MED ORDER — MENTHOL 3 MG MT LOZG
1.0000 | LOZENGE | OROMUCOSAL | Status: DC | PRN
Start: 2011-10-14 — End: 2011-10-17
  Filled 2011-10-14: qty 9

## 2011-10-14 MED ORDER — NEOSTIGMINE METHYLSULFATE 1 MG/ML IJ SOLN
INTRAMUSCULAR | Status: DC | PRN
  Administered 2011-10-14: 5 mg via INTRAVENOUS

## 2011-10-14 MED ORDER — SUCCINYLCHOLINE CHLORIDE 20 MG/ML IJ SOLN
INTRAMUSCULAR | Status: DC | PRN
  Administered 2011-10-14: 100 mg via INTRAVENOUS

## 2011-10-14 MED ORDER — CHLORTHALIDONE 25 MG PO TABS
25.0000 mg | ORAL_TABLET | ORAL | Status: DC
  Administered 2011-10-14 – 2011-10-16 (×2): 25 mg via ORAL
  Filled 2011-10-14 (×2): qty 1

## 2011-10-14 MED ORDER — CEFAZOLIN SODIUM-DEXTROSE 2-3 GM-% IV SOLR
2.0000 g | INTRAVENOUS | Status: AC
  Administered 2011-10-14: 2 g via INTRAVENOUS

## 2011-10-14 MED ORDER — INSULIN ASPART 100 UNIT/ML ~~LOC~~ SOLN
0.0000 [IU] | Freq: Three times a day (TID) | SUBCUTANEOUS | Status: DC
  Administered 2011-10-15: 2 [IU] via SUBCUTANEOUS
  Administered 2011-10-15: 5 [IU] via SUBCUTANEOUS
  Administered 2011-10-15 – 2011-10-16 (×2): 3 [IU] via SUBCUTANEOUS
  Administered 2011-10-16 (×2): 2 [IU] via SUBCUTANEOUS
  Administered 2011-10-17 (×2): 3 [IU] via SUBCUTANEOUS
  Filled 2011-10-14: qty 3

## 2011-10-14 MED ORDER — EPHEDRINE SULFATE 50 MG/ML IJ SOLN
INTRAMUSCULAR | Status: DC | PRN
  Administered 2011-10-14 (×2): 5 mg via INTRAVENOUS

## 2011-10-14 MED ORDER — CEFAZOLIN SODIUM-DEXTROSE 2-3 GM-% IV SOLR
2.0000 g | Freq: Four times a day (QID) | INTRAVENOUS | Status: AC
  Administered 2011-10-14 – 2011-10-15 (×3): 2 g via INTRAVENOUS
  Filled 2011-10-14 (×3): qty 50

## 2011-10-14 MED ORDER — LIDOCAINE HCL (CARDIAC) 20 MG/ML IV SOLN
INTRAVENOUS | Status: DC | PRN
  Administered 2011-10-14: 100 mg via INTRAVENOUS

## 2011-10-14 MED ORDER — INSULIN GLARGINE 100 UNIT/ML ~~LOC~~ SOLN
50.0000 [IU] | Freq: Every day | SUBCUTANEOUS | Status: DC
  Administered 2011-10-14 – 2011-10-16 (×3): 50 [IU] via SUBCUTANEOUS
  Filled 2011-10-14: qty 3

## 2011-10-14 MED ORDER — DORZOLAMIDE HCL-TIMOLOL MAL 2-0.5 % OP SOLN
1.0000 [drp] | Freq: Two times a day (BID) | OPHTHALMIC | Status: DC
  Administered 2011-10-14 – 2011-10-17 (×6): 1 [drp] via OPHTHALMIC
  Filled 2011-10-14: qty 10

## 2011-10-14 MED ORDER — ATORVASTATIN CALCIUM 10 MG PO TABS
10.0000 mg | ORAL_TABLET | Freq: Every day | ORAL | Status: DC
  Administered 2011-10-15 – 2011-10-17 (×3): 10 mg via ORAL
  Filled 2011-10-14 (×3): qty 1

## 2011-10-14 MED ORDER — FENTANYL CITRATE 0.05 MG/ML IJ SOLN
INTRAMUSCULAR | Status: DC | PRN
  Administered 2011-10-14 (×2): 50 ug via INTRAVENOUS

## 2011-10-14 MED ORDER — LISINOPRIL 40 MG PO TABS
40.0000 mg | ORAL_TABLET | Freq: Every day | ORAL | Status: DC
  Administered 2011-10-15 – 2011-10-17 (×3): 40 mg via ORAL
  Filled 2011-10-14 (×3): qty 1

## 2011-10-14 MED ORDER — ACETAMINOPHEN 650 MG RE SUPP: 650.0000 mg | Freq: Four times a day (QID) | RECTAL | Status: DC | PRN

## 2011-10-14 MED ORDER — ONDANSETRON HCL 4 MG PO TABS: 4.0000 mg | ORAL_TABLET | Freq: Four times a day (QID) | ORAL | Status: DC | PRN

## 2011-10-14 MED ORDER — ACETAMINOPHEN 325 MG PO TABS: 650.0000 mg | ORAL_TABLET | Freq: Four times a day (QID) | ORAL | Status: DC | PRN

## 2011-10-14 MED ORDER — HYDROCODONE-ACETAMINOPHEN 7.5-325 MG PO TABS
1.0000 | ORAL_TABLET | ORAL | Status: DC | PRN
  Administered 2011-10-14: 1 via ORAL
  Administered 2011-10-15 – 2011-10-16 (×7): 2 via ORAL
  Administered 2011-10-16: 1 via ORAL
  Administered 2011-10-17 (×2): 2 via ORAL
  Filled 2011-10-14 (×7): qty 2
  Filled 2011-10-14: qty 1
  Filled 2011-10-14 (×3): qty 2

## 2011-10-14 MED ORDER — LACTATED RINGERS IV SOLN
INTRAVENOUS | Status: DC
  Administered 2011-10-14: 10:00:00 via INTRAVENOUS

## 2011-10-14 MED ORDER — ROCURONIUM BROMIDE 100 MG/10ML IV SOLN
INTRAVENOUS | Status: DC | PRN
  Administered 2011-10-14: 10 mg via INTRAVENOUS
  Administered 2011-10-14: 30 mg via INTRAVENOUS

## 2011-10-14 MED ORDER — DIPHENHYDRAMINE HCL 12.5 MG/5ML PO ELIX: 12.5000 mg | ORAL_SOLUTION | ORAL | Status: DC | PRN

## 2011-10-14 MED ORDER — BUPIVACAINE-EPINEPHRINE 0.5% -1:200000 IJ SOLN
INTRAMUSCULAR | Status: AC
  Filled 2011-10-14: qty 1

## 2011-10-14 MED ORDER — METHOCARBAMOL 500 MG PO TABS
500.0000 mg | ORAL_TABLET | Freq: Four times a day (QID) | ORAL | Status: DC | PRN
  Administered 2011-10-14 – 2011-10-17 (×5): 500 mg via ORAL
  Filled 2011-10-14 (×5): qty 1

## 2011-10-14 MED ORDER — INSULIN ASPART 100 UNIT/ML ~~LOC~~ SOLN: 5.0000 [IU] | Freq: Three times a day (TID) | SUBCUTANEOUS | Status: DC

## 2011-10-14 MED ORDER — METOCLOPRAMIDE HCL 5 MG/ML IJ SOLN: 5.0000 mg | Freq: Three times a day (TID) | INTRAMUSCULAR | Status: DC | PRN

## 2011-10-14 MED ORDER — CEFAZOLIN SODIUM-DEXTROSE 2-3 GM-% IV SOLR
INTRAVENOUS | Status: AC
  Filled 2011-10-14: qty 50

## 2011-10-14 SURGICAL SUPPLY — 55 items
ANCH SUT 2 CP-2 ROTR CUF (Anchor) ×2 IMPLANT
ANCH SUT PUSHLCK 24X4.5 STRL (Orthopedic Implant) ×1 IMPLANT
ANCHOR ROTATOR CUFF #2 (Anchor) ×4 IMPLANT
APL SKNCLS STERI-STRIP NONHPOA (GAUZE/BANDAGES/DRESSINGS) ×1
BAG SPEC THK2 15X12 ZIP CLS (MISCELLANEOUS) ×1
BAG ZIPLOCK 12X15 (MISCELLANEOUS) ×2 IMPLANT
BENZOIN TINCTURE PRP APPL 2/3 (GAUZE/BANDAGES/DRESSINGS) ×2 IMPLANT
BLADE OSCILLATING/SAGITTAL (BLADE) ×2
BLADE SW THK.38XMED LNG THN (BLADE) ×1 IMPLANT
BNDG ADH 5X4 AIR PERM ELC (GAUZE/BANDAGES/DRESSINGS) ×1
BNDG COHESIVE 4X5 WHT NS (GAUZE/BANDAGES/DRESSINGS) ×2 IMPLANT
BUR OVAL CARBIDE 4.0 (BURR) ×2 IMPLANT
CHLORAPREP W/TINT 26ML (MISCELLANEOUS) IMPLANT
CLEANER TIP ELECTROSURG 2X2 (MISCELLANEOUS) ×2 IMPLANT
CLOSURE STERI STRIP 1/2 X4 (GAUZE/BANDAGES/DRESSINGS) ×2 IMPLANT
CLOTH BEACON ORANGE TIMEOUT ST (SAFETY) ×2 IMPLANT
DECANTER SPIKE VIAL GLASS SM (MISCELLANEOUS) ×2 IMPLANT
DRAPE ORTHO SPLIT 77X108 STRL (DRAPES) ×2
DRAPE POUCH INSTRU U-SHP 10X18 (DRAPES) ×2 IMPLANT
DRAPE SURG ORHT 6 SPLT 77X108 (DRAPES) ×1 IMPLANT
DRSG EMULSION OIL 3X3 NADH (GAUZE/BANDAGES/DRESSINGS) ×2 IMPLANT
DRSG TELFA 4X5 ISLAND ADH (GAUZE/BANDAGES/DRESSINGS) ×2 IMPLANT
DURAPREP 26ML APPLICATOR (WOUND CARE) ×2 IMPLANT
ELECT NEEDLE TIP 2.8 STRL (NEEDLE) ×2 IMPLANT
ELECT REM PT RETURN 9FT ADLT (ELECTROSURGICAL) ×2
ELECTRODE REM PT RTRN 9FT ADLT (ELECTROSURGICAL) ×1 IMPLANT
GLOVE BIOGEL PI IND STRL 8 (GLOVE) ×1 IMPLANT
GLOVE BIOGEL PI INDICATOR 8 (GLOVE) ×1
GLOVE ECLIPSE 6.5 STRL STRAW (GLOVE) ×2 IMPLANT
GLOVE INDICATOR 6.5 STRL GRN (GLOVE) ×2 IMPLANT
GLOVE SURG SS PI 8.0 STRL IVOR (GLOVE) ×4 IMPLANT
GOWN PREVENTION PLUS LG XLONG (DISPOSABLE) ×2 IMPLANT
GOWN STRL REIN XL XLG (GOWN DISPOSABLE) ×4 IMPLANT
KIT BASIN OR (CUSTOM PROCEDURE TRAY) ×2 IMPLANT
MANIFOLD NEPTUNE II (INSTRUMENTS) ×2 IMPLANT
NEEDLE MA TROC 1/2 (NEEDLE) IMPLANT
NEEDLE MA TROC 1/2 CIR (NEEDLE) IMPLANT
PACK SHOULDER CUSTOM OPM052 (CUSTOM PROCEDURE TRAY) ×2 IMPLANT
POSITIONER SURGICAL ARM (MISCELLANEOUS) ×2 IMPLANT
PUSHLOCK PEEK 4.5X24 (Orthopedic Implant) ×2 IMPLANT
SLING ARM IMMOBILIZER LRG (SOFTGOODS) ×2 IMPLANT
SLING ULTRA II L (ORTHOPEDIC SUPPLIES) ×2 IMPLANT
SLING ULTRA II S (ORTHOPEDIC SUPPLIES) ×2 IMPLANT
SPONGE LAP 4X18 X RAY DECT (DISPOSABLE) ×2 IMPLANT
STRIP CLOSURE SKIN 1/2X4 (GAUZE/BANDAGES/DRESSINGS) ×2 IMPLANT
SUT BONE WAX W31G (SUTURE) ×2 IMPLANT
SUT ETHIBOND 0 (SUTURE) ×4 IMPLANT
SUT ETHIBOND 2 OS 4 DA (SUTURE) ×2 IMPLANT
SUT PROLENE 3 0 PS 2 (SUTURE) ×2 IMPLANT
SUT VIC AB 1 CT1 27 (SUTURE) ×4
SUT VIC AB 1 CT1 27XBRD ANTBC (SUTURE) ×2 IMPLANT
SUT VIC AB 1-0 CT2 27 (SUTURE) ×4 IMPLANT
SUT VIC AB 2-0 CT2 27 (SUTURE) ×2 IMPLANT
SUT VICRYL 0 UR6 27IN ABS (SUTURE) ×4 IMPLANT
SUT VICRYL 0-0 OS 2 NEEDLE (SUTURE) IMPLANT

## 2011-10-14 NOTE — Op Note (Signed)
NAMEZYMIER, RODGERS NO.:  [id number]  MEDICAL RECORD NO.:  [id number]  LOCATION:  1614                         FACILITY:  [facility]  PHYSICIAN:  Jene Every, M.D.    DATE OF BIRTH:  08/13/1940  DATE OF PROCEDURE:  10/14/2011 DATE OF DISCHARGE:                              OPERATIVE REPORT   PREOPERATIVE DIAGNOSIS:  Rotator cuff tear, massive, retracted, right shoulder.  POSTOPERATIVE DIAGNOSIS:  Rotator cuff tear, massive, retracted, right shoulder.  PROCEDURE PERFORMED: 1. Mini-open rotator cuff repair. 2. Subacromial decompression. 3. Acromioplasty. 4. Utilization of Mitek suture anchors.  ANESTHESIA:  General.  ASSISTANT:  Roma Schanz, P.A.  HISTORY:  This is a 71 year old, retracted tear of rotator cuff indicated for repair.  Risks and benefits discussed including bleeding, infection, re-tear and need for revision, DVT, PE, anesthetic complications, etc.  TECHNIQUE:  The patient in supine beach chair position after induction of adequate general anesthesia and 2 g Kefzol, the right shoulder and upper extremity were prepped and draped in usual sterile fashion.  Small incision over the anterolateral aspect of the acromion was made, 2 cm in length.  Subcutaneous tissue was dissected by cautery to achieve hemostasis.  Raphe between the anterolateral heads was identified, divided in line with the skin incision, released the CA ligament. Subacromial space entered, the joint fluid evacuated, full-thickness tear supraspinatus, portion of the infraspinatus subscap was intact. Biceps was intact.  We lavaged the glenohumeral joint.  I performed acromioplasty with 3-mm Kerrison and mobilized the supraspinatus posteriorly and the infraspraspinatus, created a bed lateral to the articular surfaces.  We mobilized the supraspinatus.  Initially felt it was unrepairable, but after careful mobilization, we advanced the tear of the infraspinatus to the lateral  portion of the greater tuberosity. I placed Mitek suture anchor in the bed, everted through the infraspinatus tear, advanced it and sutured it with a good surgical knot.  I then pulled that over the greater tuberosity and placed a double anchor double row fixation configuration with a push lock, after installation of an awl, and insertion of the push lock in the appropriate fashion.  Good fixation was noted of the infraspinatus portion posteriorly.  Then with the supraspinatus, advanced that over to the subscap.  There was multiple tearing noted throughout the tendon.  I placed 2 Mitek suture anchor in the anterior footprint of the supraspinatus into the bone with good resistance to pullout.  Threaded it through the supraspinatus and subscap and repaired it with good surgical knot.  Then over sewed with 0 Vicryl interrupted figure-of- eight sutures and the residual, the Ethibond from the Mitek.  We were able to achieve full coverage.  Again in the center portion supraspinatus, it was fairly frayed.  We over sewed it to the residual stump of the greater tuberosity as well.  We had excised the bursa as well.  It was copiously irrigated.  The portion of proximal and central, there was fairly thin and attenuated.  Therefore, copiously irrigated the wound and then repaired the raphe with #1 Vicryl interrupted figure- of-eight sutures, subcu with 2-0 Vicryl simple sutures.  Skin was reapproximated with 4-0 subcuticular Prolene.  Wound reinforced with  Steri-Strips.  Sterile dressing applied, placed in an abduction pillow, extubated without difficulty, and transported to the recovery room in satisfactory condition.  The patient tolerated the procedure well.  No complications.  Minimal blood loss.     Jene Every, M.D.     Cordelia Pen  D:  10/14/2011  T:  10/14/2011  Job:  [id number]

## 2011-10-14 NOTE — Plan of Care (Signed)
Problem: Consults Goal: Skin Care Protocol Initiated - if indicated If consults are not indicated, leave blank or document N/A Right rotator cuff

## 2011-10-14 NOTE — Anesthesia Postprocedure Evaluation (Signed)
  Anesthesia Post-op Note  Patient: Seth Young  Procedure(s) Performed: Procedure(s) (LRB): ROTATOR CUFF REPAIR SHOULDER OPEN (Right) SUBACROMIAL DECOMPRESSION (Right)  Patient Location: PACU  Anesthesia Type: General  Level of Consciousness: awake and alert   Airway and Oxygen Therapy: Patient Spontanous Breathing  Post-op Pain: mild  Post-op Assessment: Post-op Vital signs reviewed, Patient's Cardiovascular Status Stable, Respiratory Function Stable, Patent Airway and No signs of Nausea or vomiting  Post-op Vital Signs: stable  Complications: No apparent anesthesia complications

## 2011-10-14 NOTE — H&P (Signed)
Seth Young is an 71 y.o. male.   Chief Complaint: right shoulder pain HPI: right RCT  Past Medical History  Diagnosis Date  . Diabetes mellitus 10-12-11    diabetes since age 72-Insulin started 10 yrs ago  . Hypertension   . H/O hiatal hernia 10-12-11    no problems  . Cancer 10-12-11    basal cell carcinoma -left temporal area-no residual  . Arthritis 10-12-11    generalized. Rt. 4th(ring finger) -has developed ?trigger finger"  . Neuromuscular disorder 10-12-11    right rotator cuff tear-surgery planned.  . Colon polyps 10-12-11    past hx.  . Cataract 10-12-11    bilateral-not surgical yet  . Sleep apnea 10-12-11    dx. no cpap used    Past Surgical History  Procedure Date  . Back surgery 10-12-11    '64-lumbar lam.  . Nasal septum surgery 10-12-11    '79  . Carpal tunnel release 10-12-11    2 yrs ago -right/ 6 yrs ago -left  . Knee arthroscopy 10-12-11    10'12-left knee  . Eye surgery 10-12-11    right eye retinal vein occlusion-1 yr ago  . Cardiac catheterization 10-12-11    Dr. Halford Decamp yrs ago-negative.    No family history on file. Social History:  reports that he quit smoking about 23 years ago. His smoking use included Cigarettes. He does not have any smokeless tobacco history on file. He reports that he does not drink alcohol or use illicit drugs.  Allergies:  Allergies  Allergen Reactions  . Morphine And Related Itching  . Naproxen Other (See Comments)    Tremors, hallucinates     No current facility-administered medications on file as of .   No current outpatient prescriptions on file as of .    Results for orders placed during the hospital encounter of 10/12/11 (from the past 48 hour(s))  SURGICAL PCR SCREEN     Status: Normal   Collection Time   10/12/11 12:49 PM      Component Value Range Comment   MRSA, PCR NEGATIVE  NEGATIVE     Staphylococcus aureus NEGATIVE  NEGATIVE    CBC     Status: Normal   Collection Time   10/12/11 12:55 PM      Component Value  Range Comment   WBC 6.6  4.0 - 10.5 (K/uL)    RBC 5.24  4.22 - 5.81 (MIL/uL)    Hemoglobin 15.1  13.0 - 17.0 (g/dL)    HCT 16.1  09.6 - 04.5 (%)    MCV 87.6  78.0 - 100.0 (fL)    MCH 28.8  26.0 - 34.0 (pg)    MCHC 32.9  30.0 - 36.0 (g/dL)    RDW 40.9  81.1 - 91.4 (%)    Platelets 277  150 - 400 (K/uL)   BASIC METABOLIC PANEL     Status: Abnormal   Collection Time   10/12/11 12:55 PM      Component Value Range Comment   Sodium 133 (*) 135 - 145 (mEq/L)    Potassium 4.5  3.5 - 5.1 (mEq/L)    Chloride 98  96 - 112 (mEq/L)    CO2 26  19 - 32 (mEq/L)    Glucose, Bld 142 (*) 70 - 99 (mg/dL)    BUN 16  6 - 23 (mg/dL)    Creatinine, Ser 7.82  0.50 - 1.35 (mg/dL)    Calcium 95.6  8.4 - 10.5 (mg/dL)  GFR calc non Af Amer 69 (*) >90 (mL/min)    GFR calc Af Amer 80 (*) >90 (mL/min)    No results found.  Review of Systems  Constitutional: Negative.   Musculoskeletal: Positive for joint pain.  All other systems reviewed and are negative.    There were no vitals taken for this visit. Physical Exam  Constitutional: He is oriented to person, place, and time. He appears well-developed.  HENT:  Head: Normocephalic.  Eyes: Pupils are equal, round, and reactive to light.  Neck: Normal range of motion.  Cardiovascular: Normal rate.   Respiratory: Effort normal.  GI: Soft.  Musculoskeletal: He exhibits tenderness.       Pain with ROM right shoulder. Weak ER  Neurological: He is alert and oriented to person, place, and time.     Assessment/Plan Right RCT. Plan Right RC repair. Risks Discussed.  Geran Haithcock C 10/14/2011, 7:19 AM

## 2011-10-14 NOTE — Transfer of Care (Signed)
Immediate Anesthesia Transfer of Care Note  Patient: Seth Young  Procedure(s) Performed: Procedure(s) (LRB): ROTATOR CUFF REPAIR SHOULDER OPEN (Right) SUBACROMIAL DECOMPRESSION (Right)  Patient Location: PACU  Anesthesia Type: General  Level of Consciousness: sedated, patient cooperative and responds to stimulaton  Airway & Oxygen Therapy: Patient Spontanous Breathing and Patient connected to face mask oxgen  Post-op Assessment: Report given to PACU RN and Post -op Vital signs reviewed and stable  Post vital signs: Reviewed and stable  Complications: No apparent anesthesia complications

## 2011-10-14 NOTE — Brief Op Note (Signed)
10/14/2011  12:09 PM  PATIENT:  Seth Young  71 y.o. male  PRE-OPERATIVE DIAGNOSIS:  Rotator Cuff Tear on the Right  POST-OPERATIVE DIAGNOSIS:  Rotator Cuff Tear on the Right  PROCEDURE:  Procedure(s) (LRB): ROTATOR CUFF REPAIR SHOULDER OPEN (Right) SUBACROMIAL DECOMPRESSION (Right)  SURGEON:  Surgeon(s) and Role:    * Javier Docker, MD - Primary  PHYSICIAN ASSISTANT:   ASSISTANTS: strader   ANESTHESIA:   general  EBL:     BLOOD ADMINISTERED:none  DRAINS: none   LOCAL MEDICATIONS USED:  MARCAINE     SPECIMEN:  No Specimen  DISPOSITION OF SPECIMEN:  N/A  COUNTS:  YES  TOURNIQUET:  * No tourniquets in log *  DICTATION: .Other Dictation: Dictation Number [id number]  PLAN OF CARE: Admit to inpatient   PATIENT DISPOSITION:  PACU - hemodynamically stable.   Delay start of Pharmacological VTE agent (>24hrs) due to surgical blood loss or risk of bleeding: yes

## 2011-10-14 NOTE — Anesthesia Preprocedure Evaluation (Addendum)
Anesthesia Evaluation  Patient identified by MRN, date of birth, ID band Patient awake    Reviewed: Allergy & Precautions, H&P , NPO status , Patient's Chart, lab work & pertinent test results  Airway Mallampati: II TM Distance: >3 FB Neck ROM: Full    Dental No notable dental hx. (+) Edentulous Upper and Edentulous Lower   Pulmonary neg pulmonary ROS, sleep apnea ,  breath sounds clear to auscultation  Pulmonary exam normal       Cardiovascular hypertension, Pt. on medications negative cardio ROS  Rhythm:Regular Rate:Normal     Neuro/Psych negative neurological ROS  negative psych ROS   GI/Hepatic negative GI ROS, Neg liver ROS, hiatal hernia,   Endo/Other  negative endocrine ROSDiabetes mellitus-  Renal/GU negative Renal ROS  negative genitourinary   Musculoskeletal negative musculoskeletal ROS (+)   Abdominal   Peds negative pediatric ROS (+)  Hematology negative hematology ROS (+)   Anesthesia Other Findings   Reproductive/Obstetrics negative OB ROS                          Anesthesia Physical Anesthesia Plan  ASA: II  Anesthesia Plan: General   Post-op Pain Management:    Induction: Intravenous  Airway Management Planned:   Additional Equipment:   Intra-op Plan:   Post-operative Plan: Extubation in OR  Informed Consent: I have reviewed the patients History and Physical, chart, labs and discussed the procedure including the risks, benefits and alternatives for the proposed anesthesia with the patient or authorized representative who has indicated his/her understanding and acceptance.   Dental advisory given  Plan Discussed with: CRNA  Anesthesia Plan Comments:         Anesthesia Quick Evaluation

## 2011-10-15 DIAGNOSIS — M75100 Unspecified rotator cuff tear or rupture of unspecified shoulder, not specified as traumatic: Secondary | ICD-10-CM

## 2011-10-15 LAB — GLUCOSE, CAPILLARY: Glucose-Capillary: 139 mg/dL — ABNORMAL HIGH (ref 70–99)

## 2011-10-15 MED ORDER — METHOCARBAMOL 500 MG PO TABS: 500.0000 mg | ORAL_TABLET | Freq: Four times a day (QID) | ORAL | Status: AC | PRN

## 2011-10-15 MED ORDER — VITAMINS A & D EX OINT
TOPICAL_OINTMENT | CUTANEOUS | Status: AC
  Administered 2011-10-15: 19:00:00
  Filled 2011-10-15: qty 5

## 2011-10-15 MED ORDER — HYDROCODONE-ACETAMINOPHEN 7.5-325 MG PO TABS: 1.0000 | ORAL_TABLET | ORAL | Status: AC | PRN

## 2011-10-15 NOTE — Progress Notes (Signed)
Subjective: 1 Day Post-Op Procedure(s) (LRB): ROTATOR CUFF REPAIR SHOULDER OPEN (Right) SUBACROMIAL DECOMPRESSION (Right) Patient reports pain as moderate.   Denies CP or SOB.  Voiding without difficulty. Positive flatus. Objective: Vital signs in last 24 hours: Temp:  [97.4 F (36.3 C)-99 F (37.2 C)] 98.2 F (36.8 C) (03/08 0733) Pulse Rate:  [77-104] 80  (03/08 0733) Resp:  [10-18] 16  (03/08 0733) BP: (118-180)/(65-98) 138/74 mmHg (03/08 0733) SpO2:  [97 %-100 %] 98 % (03/08 0733) Weight:  [112.946 kg (249 lb)] 112.946 kg (249 lb) (03/07 1425)  Intake/Output from previous day: 03/07 0701 - 03/08 0700 In: 4072.5 [P.O.:1800; I.V.:2222.5; IV Piggyback:50] Out: 2175 [Urine:2175] Intake/Output this shift:     Basename 10/12/11 1255  HGB 15.1    Basename 10/12/11 1255  WBC 6.6  RBC 5.24  HCT 45.9  PLT 277    Basename 10/12/11 1255  NA 133*  K 4.5  CL 98  CO2 26  BUN 16  CREATININE 1.06  GLUCOSE 142*  CALCIUM 10.3   No results found for this basename: LABPT:2,INR:2 in the last 72 hours  Neurologically intact Neurovascular intact Sensation intact distally Incision: dressing C/D/I Compartment soft  Assessment/Plan: 1 Day Post-Op Procedure(s) (LRB): ROTATOR CUFF REPAIR SHOULDER OPEN (Right) SUBACROMIAL DECOMPRESSION (Right) Advance diet Up with therapy Plan for discharge tomorrow  Morgen Linebaugh R. 10/15/2011, 7:41 AM

## 2011-10-15 NOTE — Progress Notes (Signed)
OT Note Eval completed and filed in shadow chart. All education completed. Pt will have necessary level of A upon d/c. Pt presents with no further OT needs in acute. Progress rehab of the shoulder as ordered by MD post f/u appt.  Garrel Ridgel, OTR/L  Pager [phone] 10/15/2011

## 2011-10-15 NOTE — Progress Notes (Signed)
CARE MANAGEMENT NOTE 10/15/2011  Patient:  Seth Young   Account Number:  [id number]  Date Initiated:  10/15/2011  Documentation initiated by:  Colleen Can  Subjective/Objective Assessment:   dx repair of rotator cuff tear     Action/Plan:   cm spoke with patient and spouse.plans are for discharge to home where spouse will be caregiver. there are no hh or dme needs noted   Anticipated DC Date:  10/16/2011   Anticipated DC Plan:  HOME/SELF CARE  In-house referral  NA      DC Planning Services  CM consult      PAC Choice  NA   Choice offered to / List presented to:  NA   DME arranged  NA      DME agency  NA     HH arranged  NA      Status of service:  Completed, signed off Medicare Important Message given?  NA - LOS <3 / Initial given by admissions (If response is "NO", the following Medicare IM given date fields will be blank)

## 2011-10-16 LAB — GLUCOSE, CAPILLARY

## 2011-10-16 MED ORDER — SENNA 8.6 MG PO TABS
2.0000 | ORAL_TABLET | Freq: Two times a day (BID) | ORAL | Status: DC
  Administered 2011-10-16 – 2011-10-17 (×3): 17.2 mg via ORAL
  Filled 2011-10-16: qty 1
  Filled 2011-10-16 (×2): qty 2
  Filled 2011-10-16: qty 1

## 2011-10-16 MED ORDER — MAGNESIUM CITRATE PO SOLN
0.5000 | Freq: Once | ORAL | Status: AC
  Administered 2011-10-16: 13:00:00 via ORAL

## 2011-10-16 MED ORDER — VITAMINS A & D EX OINT
TOPICAL_OINTMENT | CUTANEOUS | Status: AC
  Filled 2011-10-16: qty 5

## 2011-10-16 NOTE — Progress Notes (Signed)
Subjective: 2 Days Post-Op Procedure(s) (LRB): ROTATOR CUFF REPAIR SHOULDER OPEN (Right) SUBACROMIAL DECOMPRESSION (Right) Patient reports pain as mild.  Pt c/o lower rib and upper abdominal pain when coughing.  Denies SOB or CP.  Has not had a BM since day of surgery.  Also c/o R knee pain and swelling.  Hard to walk on when getting out of bed.  Known h/o arthritis of knee but no recent injury.  Denies h/o gout.  Also c/o R arm itching from immobilizer.  Objective: Vital signs in last 24 hours: Temp:  [97.7 F (36.5 C)-98.4 F (36.9 C)] 98.4 F (36.9 C) (03/09 0548) Pulse Rate:  [83-94] 94  (03/09 0548) Resp:  [14-16] 16  (03/09 0548) BP: (138-150)/(72-83) 148/78 mmHg (03/09 0548) SpO2:  [93 %-98 %] 97 % (03/09 0548)  Intake/Output from previous day: 03/08 0701 - 03/09 0700 In: 1320 [P.O.:1320] Out: 325 [Urine:325] Intake/Output this shift:    No results found for this basename: HGB:5 in the last 72 hours No results found for this basename: WBC:2,RBC:2,HCT:2,PLT:2 in the last 72 hours No results found for this basename: NA:2,K:2,CL:2,CO2:2,BUN:2,CREATININE:2,GLUCOSE:2,CALCIUM:2 in the last 72 hours No results found for this basename: LABPT:2,INR:2 in the last 72 hours  PE:  wn wd male in nad.  Resp unlabored.  Abdomen distended and tympanitic.  NTTP at lower ribs.  R knee with large effusion but no erythema.  Slightly warm.  Not particularly tender.  R arm without any skin lesions.  No calf tenderness on either side.  Assessment/Plan: 2 Days Post-Op Procedure(s) (LRB): ROTATOR CUFF REPAIR SHOULDER OPEN (Right) SUBACROMIAL DECOMPRESSION (Right) Plan for discharge tomorrow  At this point, I think the pt's rib / abdominal pain is due to constipation.  He needs to have a BM, so we'll add a laxative and consider mag citrate.  I don't think he's showing signs of PE, though that's certainly a consideration. He's a diabetic, so steriod injection is not advisable for his knee pain.   Aspiration would be temporary.  We'll observe for now. [place]ette for R UE. Hopefully discharge to home tomorrow.  Toni Arthurs 10/16/2011, 8:53 AM

## 2011-10-17 LAB — GLUCOSE, CAPILLARY
Glucose-Capillary: 182 mg/dL — ABNORMAL HIGH (ref 70–99)
Glucose-Capillary: 160 mg/dL — ABNORMAL HIGH (ref 70–99)

## 2011-10-17 MED ORDER — LIDOCAINE HCL (PF) 1 % IJ SOLN
30.0000 mL | Freq: Once | INTRAMUSCULAR | Status: DC
  Filled 2011-10-17: qty 30

## 2011-10-17 MED ORDER — BISACODYL 10 MG RE SUPP
10.0000 mg | Freq: Every day | RECTAL | Status: DC | PRN
  Administered 2011-10-17: 10 mg via RECTAL
  Filled 2011-10-17: qty 1

## 2011-10-17 MED ORDER — FLEET ENEMA 7-19 GM/118ML RE ENEM: 1.0000 | ENEMA | Freq: Once | RECTAL | Status: DC | PRN

## 2011-10-17 MED ORDER — METHYLPREDNISOLONE ACETATE PF 40 MG/ML IJ SUSP
40.0000 mg | Freq: Once | INTRAMUSCULAR | Status: DC
  Filled 2011-10-17: qty 1

## 2011-10-17 NOTE — Progress Notes (Signed)
Discharged To [facility] via w/c. Pt tolerating weight bearing RLE " much better" since MD injected this am. Assessment unchanged except for improved pain R knee.

## 2011-10-17 NOTE — Progress Notes (Signed)
Subjective: 3 Days Post-Op Procedure(s) (LRB): ROTATOR CUFF REPAIR SHOULDER OPEN (Right) SUBACROMIAL DECOMPRESSION (Right)   Patient reports pain as mild. Mild pain of the shoulder, significant pain of the right knee. Previous hx of pseudogout. Pt ready to be discharged home.  Objective:   VITALS:   Filed Vitals:   10/17/11 0530  BP: 133/83  Pulse: 97  Temp: 99.2 F (37.3 C)  Resp: 16    Neurovascular intact Dorsiflexion/Plantar flexion intact Incision: dressing C/D/I No cellulitis present Compartment soft  LABS No new labs  Assessment/Plan: 3 Days Post-Op Procedure(s) (LRB): ROTATOR CUFF REPAIR SHOULDER OPEN (Right) SUBACROMIAL DECOMPRESSION (Right)   Pt right knee aspirated, obtained 60cc normal serous fluid with inflammatory reaction, consistent with history of pseudogout The right knee was then injected with 40cc of depo medrol. Pt knows that this may bump his sugars but wanted to use it to take care of his knee. He has had the same issue in the past and knows to monitor his blood sugars. Discharge home with home health   Anastasio Auerbach. Rooney Gladwin   PAC  10/17/2011, 10:10 AM

## 2011-10-18 NOTE — Discharge Summary (Signed)
Patient ID: Seth Young MRN: [id number] DOB/AGE: 71-Apr-1942 71 y.o.  Admit date: 10/14/2011 Discharge date: 10/18/2011  Admission Diagnoses:  Active Problems:  Rotator cuff tear  Past Medical History  Diagnosis Date  . Diabetes mellitus 10-12-11    diabetes since age 58-Insulin started 10 yrs ago  . Hypertension   . H/O hiatal hernia 10-12-11    no problems  . Cancer 10-12-11    basal cell carcinoma -left temporal area-no residual  . Arthritis 10-12-11    generalized. Rt. 4th(ring finger) -has developed ?trigger finger"  . Neuromuscular disorder 10-12-11    right rotator cuff tear-surgery planned.  . Colon polyps 10-12-11    past hx.  . Cataract 10-12-11    bilateral-not surgical yet  . Sleep apnea 10-12-11    dx. no cpap used   Discharge Diagnoses:  Same s/p RCR Pseudogout right knee    Surgeries: Procedure(s): ROTATOR CUFF REPAIR SHOULDER OPEN SUBACROMIAL DECOMPRESSION on 10/14/2011   Consultants:  PT/OT  Discharged Condition: Improved  Hospital Course: Seth Young is an 71 y.o. male who was admitted 10/14/2011 for operative treatment of rotator cuff tear. Patient has severe unremitting pain that affects sleep, daily activities, and work/hobbies. After pre-op clearance the patient was taken to the operating room on 10/14/2011 and underwent  Procedure(s): ROTATOR CUFF REPAIR SHOULDER OPEN SUBACROMIAL DECOMPRESSION.    Patient was given perioperative antibiotics:  Anti-infectives     Start     Dose/Rate Route Frequency Ordered Stop   10/14/11 1700   ceFAZolin (ANCEF) IVPB 2 g/50 mL premix        2 g 100 mL/hr over 30 Minutes Intravenous Every 6 hours 10/14/11 1446 10/15/11 0630   10/14/11 1114   polymyxin B 500,000 Units, bacitracin 50,000 Units in sodium chloride irrigation 0.9 % 500 mL irrigation  Status:  Discontinued          As needed 10/14/11 1114 10/14/11 1218   10/14/11 0900   ceFAZolin (ANCEF) IVPB 2 g/50 mL premix        2 g 100 mL/hr over 30 Minutes Intravenous 60  min pre-op 10/14/11 0859 10/14/11 1054           Patient was given sequential compression devices, early ambulation, and chemoprophylaxis to prevent DVT. POD #2 he was much better in regard to his shoulder but had developed severe knee pain.  He was treatment with aspiration and injection by Freddie Breech PA.  Patient has previous history of pseudogout.    Patient benefited maximally from hospital stay and there were no complications.     Discharge Medications:   Medication List  As of 10/18/2011 11:51 AM   STOP taking these medications         HYDROcodone-acetaminophen 5-325 MG per tablet         TAKE these medications         amLODipine 5 MG tablet   Commonly known as: NORVASC   Take 5 mg by mouth daily before breakfast.      atorvastatin 10 MG tablet   Commonly known as: LIPITOR   Take 10 mg by mouth daily before breakfast.      AVASTIN IV   Place into the left eye every 30 (thirty) days. Pt is followed by Dr. Allyne Gee [phone]      celecoxib 400 MG capsule   Commonly known as: CELEBREX   Take 400 mg by mouth daily.      chlorthalidone 25 MG tablet  Commonly known as: HYGROTON   Take 25 mg by mouth every other day.      desoximetasone 0.25 % cream   Commonly known as: TOPICORT   Apply 1 application topically daily as needed. Rash      diphenhydrAMINE 25 MG tablet   Commonly known as: BENADRYL   Take 25 mg by mouth at bedtime as needed. Sleep      dorzolamide-timolol 22.3-6.8 MG/ML ophthalmic solution   Commonly known as: COSOPT   Place 1 drop into both eyes 2 (two) times daily.      DULoxetine 30 MG capsule   Commonly known as: CYMBALTA   Take 30 mg by mouth daily before breakfast.      HYDROcodone-acetaminophen 7.5-325 MG per tablet   Commonly known as: NORCO   Take 1-2 tablets by mouth every 4 (four) hours as needed.      insulin glargine 100 UNIT/ML injection   Commonly known as: LANTUS   Inject 50 Units into the skin at bedtime.      insulin  lispro 100 UNIT/ML injection   Commonly known as: HUMALOG   Inject 5-20 Units into the skin 3 (three) times daily before meals. Sliding scale      l-methylfolate-B6-B12 3-35-2 MG Tabs   Commonly known as: METANX   Take 1 tablet by mouth daily.      lisinopril 40 MG tablet   Commonly known as: PRINIVIL,ZESTRIL   Take 40 mg by mouth daily before breakfast.      metFORMIN 1000 MG tablet   Commonly known as: GLUCOPHAGE   Take 1,000 mg by mouth 2 (two) times daily with a meal.      methocarbamol 500 MG tablet   Commonly known as: ROBAXIN   Take 1 tablet (500 mg total) by mouth every 6 (six) hours as needed.      pregabalin 50 MG capsule   Commonly known as: LYRICA   Take 50 mg by mouth every evening.      PRESCRIPTION MEDICATION   Apply 1 application topically daily. TMC 0.1% CR/ Cetaphil cr 1:4 clay - park      tobramycin 0.3 % ophthalmic solution   Commonly known as: TOBREX   Place 1 drop into the left eye 4 (four) times daily. Take 2 days before procedure and 1 day after procedure      VICTOZA 18 MG/3ML Soln   Generic drug: Liraglutide   Inject 1.8 mg into the skin daily before breakfast.            Diagnostic Studies: No results found.  Disposition: 01-Home or Self Care  Discharge Orders    Future Orders Please Complete By Expires   Diet - low sodium heart healthy      Call MD / Call 911      Comments:   If you experience chest pain or shortness of breath, CALL 911 and be transported to the hospital emergency room.  If you develope a fever above 101 F, pus (white drainage) or increased drainage or redness at the wound, or calf pain, call your surgeon's office.   Constipation Prevention      Comments:   Drink plenty of fluids.  Prune juice may be helpful.  You may use a stool softener, such as Colace (over the counter) 100 mg twice a day.  Use MiraLax (over the counter) for constipation as needed.   Increase activity slowly as tolerated      Weight Bearing as  taught in  Physical Therapy      Comments:   Use a walker or crutches as instructed.   Discharge instructions      Comments:   Use sling at all times except when exercising OK to move hand, wrist, and elbow Keep arm elevated on pillow when out of sling Change dressing daily Ok to shower in 72 hours No lifting No driving 2-4 weeks Pendulum exercises as reviewed with PT      Follow-up Information    Follow up with BEANE,JEFFREY C, MD in 2 weeks.   Contact information:   [facility] [address], [address] [place] [address] [phone]           Signed: Liam Graham. 10/18/2011, 11:51 AM

## 2011-11-02 ENCOUNTER — Ambulatory Visit: Payer: Medicare Other | Attending: Specialist

## 2011-11-02 DIAGNOSIS — M25519 Pain in unspecified shoulder: Secondary | ICD-10-CM | POA: Insufficient documentation

## 2011-11-02 DIAGNOSIS — M6281 Muscle weakness (generalized): Secondary | ICD-10-CM | POA: Insufficient documentation

## 2011-11-02 DIAGNOSIS — IMO0001 Reserved for inherently not codable concepts without codable children: Secondary | ICD-10-CM | POA: Insufficient documentation

## 2011-11-03 ENCOUNTER — Encounter (HOSPITAL_COMMUNITY): Payer: Self-pay | Admitting: Specialist

## 2011-11-04 ENCOUNTER — Ambulatory Visit: Payer: Medicare Other

## 2011-11-09 ENCOUNTER — Ambulatory Visit: Payer: Medicare Other | Attending: Specialist

## 2011-11-09 DIAGNOSIS — M6281 Muscle weakness (generalized): Secondary | ICD-10-CM | POA: Insufficient documentation

## 2011-11-09 DIAGNOSIS — M25519 Pain in unspecified shoulder: Secondary | ICD-10-CM | POA: Insufficient documentation

## 2011-11-09 DIAGNOSIS — IMO0001 Reserved for inherently not codable concepts without codable children: Secondary | ICD-10-CM | POA: Insufficient documentation

## 2011-11-10 ENCOUNTER — Ambulatory Visit: Payer: Medicare Other | Admitting: Physical Therapy

## 2011-11-15 ENCOUNTER — Ambulatory Visit: Payer: Medicare Other

## 2011-11-17 ENCOUNTER — Ambulatory Visit: Payer: Medicare Other

## 2011-11-22 ENCOUNTER — Ambulatory Visit: Payer: Medicare Other | Admitting: Physical Therapy

## 2011-11-24 ENCOUNTER — Ambulatory Visit: Payer: Medicare Other | Admitting: Physical Therapy

## 2011-11-29 ENCOUNTER — Ambulatory Visit: Payer: Medicare Other

## 2011-12-01 ENCOUNTER — Encounter: Payer: Medicare Other | Admitting: Physical Therapy

## 2011-12-02 ENCOUNTER — Ambulatory Visit: Payer: Medicare Other | Admitting: Physical Therapy

## 2011-12-06 ENCOUNTER — Ambulatory Visit: Payer: Medicare Other

## 2011-12-08 ENCOUNTER — Ambulatory Visit: Payer: Medicare Other | Attending: Specialist | Admitting: Physical Therapy

## 2011-12-08 DIAGNOSIS — M25519 Pain in unspecified shoulder: Secondary | ICD-10-CM | POA: Insufficient documentation

## 2011-12-08 DIAGNOSIS — IMO0001 Reserved for inherently not codable concepts without codable children: Secondary | ICD-10-CM | POA: Insufficient documentation

## 2011-12-08 DIAGNOSIS — M6281 Muscle weakness (generalized): Secondary | ICD-10-CM | POA: Insufficient documentation

## 2011-12-13 ENCOUNTER — Ambulatory Visit: Payer: Medicare Other

## 2011-12-15 ENCOUNTER — Ambulatory Visit: Payer: Medicare Other

## 2011-12-20 ENCOUNTER — Ambulatory Visit: Payer: Medicare Other | Admitting: Physical Therapy

## 2012-01-10 ENCOUNTER — Encounter: Payer: Medicare Other | Admitting: Physical Therapy

## 2012-01-13 ENCOUNTER — Ambulatory Visit: Payer: Medicare Other | Attending: Specialist | Admitting: Physical Therapy

## 2012-01-13 DIAGNOSIS — M25519 Pain in unspecified shoulder: Secondary | ICD-10-CM | POA: Insufficient documentation

## 2012-01-13 DIAGNOSIS — M6281 Muscle weakness (generalized): Secondary | ICD-10-CM | POA: Insufficient documentation

## 2012-01-13 DIAGNOSIS — IMO0001 Reserved for inherently not codable concepts without codable children: Secondary | ICD-10-CM | POA: Insufficient documentation

## 2012-01-17 ENCOUNTER — Ambulatory Visit: Payer: Medicare Other | Admitting: Physical Therapy

## 2012-01-19 ENCOUNTER — Ambulatory Visit: Payer: Medicare Other | Admitting: Physical Therapy

## 2012-01-26 ENCOUNTER — Ambulatory Visit: Payer: Medicare Other

## 2012-01-31 ENCOUNTER — Ambulatory Visit: Payer: Medicare Other

## 2012-02-02 ENCOUNTER — Encounter: Payer: Medicare Other | Admitting: Physical Therapy

## 2012-02-03 ENCOUNTER — Ambulatory Visit: Payer: Medicare Other | Admitting: Physical Therapy

## 2013-05-07 ENCOUNTER — Encounter: Payer: Self-pay | Admitting: Cardiology

## 2013-05-15 ENCOUNTER — Other Ambulatory Visit: Payer: Self-pay | Admitting: Gastroenterology

## 2013-05-22 ENCOUNTER — Encounter: Payer: Self-pay | Admitting: Cardiology

## 2013-08-09 HISTORY — PX: CATARACT EXTRACTION W/ INTRAOCULAR LENS IMPLANT: SHX1309

## 2013-11-27 ENCOUNTER — Other Ambulatory Visit: Payer: Self-pay | Admitting: Gastroenterology

## 2013-11-27 DIAGNOSIS — R112 Nausea with vomiting, unspecified: Secondary | ICD-10-CM

## 2013-11-30 ENCOUNTER — Ambulatory Visit
Admission: RE | Admit: 2013-11-30 | Discharge: 2013-11-30 | Disposition: A | Discharge status: Home / Self Care | Payer: Medicare Other | Source: Ambulatory Visit | Attending: Gastroenterology | Admitting: Gastroenterology

## 2013-11-30 ENCOUNTER — Encounter (INDEPENDENT_AMBULATORY_CARE_PROVIDER_SITE_OTHER): Payer: Self-pay

## 2013-11-30 DIAGNOSIS — R112 Nausea with vomiting, unspecified: Secondary | ICD-10-CM

## 2014-06-21 ENCOUNTER — Encounter: Payer: Self-pay | Admitting: Cardiology

## 2014-06-21 ENCOUNTER — Ambulatory Visit (INDEPENDENT_AMBULATORY_CARE_PROVIDER_SITE_OTHER): Payer: Medicare Other | Admitting: Cardiology

## 2014-06-21 VITALS — BP 136/70 | HR 84 | Ht 71.5 in | Wt 249.4 lb

## 2014-06-21 DIAGNOSIS — E0842 Diabetes mellitus due to underlying condition with diabetic polyneuropathy: Secondary | ICD-10-CM

## 2014-06-21 DIAGNOSIS — R0789 Other chest pain: Secondary | ICD-10-CM

## 2014-06-21 DIAGNOSIS — E7800 Pure hypercholesterolemia, unspecified: Secondary | ICD-10-CM | POA: Insufficient documentation

## 2014-06-21 DIAGNOSIS — I10 Essential (primary) hypertension: Secondary | ICD-10-CM

## 2014-06-21 DIAGNOSIS — E1140 Type 2 diabetes mellitus with diabetic neuropathy, unspecified: Secondary | ICD-10-CM | POA: Insufficient documentation

## 2014-06-21 DIAGNOSIS — R079 Chest pain, unspecified: Secondary | ICD-10-CM | POA: Insufficient documentation

## 2014-06-21 NOTE — Patient Instructions (Signed)
Your physician recommends that you continue on your current medications as directed. Please refer to the Current Medication list given to you today.  Your physician has requested that you have a lexiscan myoview. For further information please visit [email or web address]. Please follow instruction sheet, as given. WALKING LEXISCAN  FOLLOW UP AS NEEDED  

## 2014-06-21 NOTE — Progress Notes (Signed)
Seth Young Date of Birth:  1940-11-25 [facility] [address] [place], [place]  [address] [phone]        Fax   [phone]   History of Present Illness: This pleasant 73 year old gentleman is seen after a long absence.  He is seen at request of Dr. [name].  He is seen because of some recent chest discomfort.  The patient initially felt that the chest discomfort was related to indigestion.  He has seen Dr. Watt Climes.endoscopy was unremarkable.  He also had a normal gallbladder ultrasound.  The patient does not have any known history of heart disease.  He had a normal heart catheterization many years ago.  Patient has multiple risk factors for coronary disease.  He has been a diabetic for 30 years.  He has elevated blood pressure.  He has a history of high cholesterol.  He is sedentary and overweight. His family history reveals that his father died of a massive heart attack at age 65 without antecedent coronary symptoms His mother died at age 56 of diabetes and old age. The patient himself quit smoking and drinking about 30 years ago. Additional medical history reveals the fact that the patient has arthritis of his knees and feet.  He has had previous arthroscopic surgery of his right knee by Dr. Theda Sers.  He has had surgery on his right shoulder for rotator cuff tear.  He has bilateral carpal tunnel syndrome awaiting surgery.  Current Outpatient Prescriptions  Medication Sig Dispense Refill  . Aflibercept (EYLEA IO) Inject into the eye daily.    Marland Kitchen amLODipine (NORVASC) 5 MG tablet Take 5 mg by mouth daily before breakfast.    . atorvastatin (LIPITOR) 10 MG tablet Take 10 mg by mouth daily before breakfast.    . Canagliflozin (INVOKANA PO) Take 1 tablet by mouth daily.    . celecoxib (CELEBREX) 400 MG capsule Take 400 mg by mouth daily.    . chlorthalidone (HYGROTON) 25 MG tablet Take 25 mg by mouth every other day.    . colchicine 0.6 MG tablet Take 0.6 mg by  mouth as needed (gout).    Marland Kitchen desoximetasone (TOPICORT) 0.25 % cream Apply 1 application topically as needed (licanplantusa).    . dicyclomine (BENTYL) 10 MG capsule Take 10 mg by mouth 2 (two) times daily.    . insulin glargine (LANTUS) 100 UNIT/ML injection Inject 70 Units into the skin at bedtime.     . insulin lispro (HUMALOG) 100 UNIT/ML injection Inject into the skin as needed for high blood sugar (sliding scale). Sliding scale      . lisinopril (PRINIVIL,ZESTRIL) 40 MG tablet Take 40 mg by mouth daily before breakfast.    . metFORMIN (GLUCOPHAGE) 1000 MG tablet Take 1,000 mg by mouth 2 (two) times daily with a meal.    . metoCLOPramide (REGLAN) 10 MG/10ML SOLN Take 10 mg by mouth daily after supper.    . NON FORMULARY once a week. Bydureon  shot once a week..    . omeprazole (PRILOSEC) 40 MG capsule Take 40 mg by mouth 2 (two) times daily.    . pregabalin (LYRICA) 50 MG capsule Take 50 mg by mouth every evening.     No current facility-administered medications for this visit.    Allergies  Allergen Reactions  . Morphine And Related Itching    Itching   . Naproxen Other (See Comments)    Tremors, hallucinates     Patient Active Problem List  Diagnosis Date Noted  . Chest pain of uncertain etiology 48/27/0786  . Diabetic neuropathy 06/21/2014  . Essential hypertension 06/21/2014  . Hypercholesterolemia 06/21/2014  . Rotator cuff tear 10/15/2011  . DM 10/16/2008    History  Smoking status  . Former Smoker  . Types: Cigarettes  . Quit date: 10/11/1988  Smokeless tobacco  . Not on file    History  Alcohol Use No    Comment: Quit -20 yrs ago    History reviewed. No pertinent family history.  Review of Systems: Constitutional: no fever chills diaphoresis or fatigue or change in weight.  Head and neck: no hearing loss, no epistaxis, no photophobia or visual disturbance. Respiratory: No cough, shortness of breath or wheezing. Cardiovascular: No  peripheral  edema, palpitations.Positive for chest pain which is atypical Gastrointestinal: No abdominal distention, no abdominal pain, no change in bowel habits hematochezia or melena. Genitourinary: No dysuria, no frequency, no urgency, no nocturia. Musculoskeletal:No arthralgias, no back pain, no gait disturbance or myalgias.positive for arthritis of the knees and for bilateral carpal tunnel syndrome. Neurological: No dizziness, no headaches, no numbness, no seizures, no syncope, no weakness, no tremors. Hematologic: No lymphadenopathy, no easy bruising. Psychiatric: No confusion, no hallucinations, no sleep disturbance.    Physical Exam: Filed Vitals:   06/21/14 1452  BP: 136/70  Pulse: 84  The patient appears to be in no distress.  Head and neck exam reveals that the pupils are equal and reactive.  The extraocular movements are full.  There is no scleral icterus.  Mouth and pharynx are benign.  No lymphadenopathy.  No carotid bruits.  The jugular venous pressure is normal.  Thyroid is not enlarged or tender.  Chest is clear to percussion and auscultation.  No rales or rhonchi.  Expansion of the chest is symmetrical.  Heart reveals no abnormal lift or heave.  First and second heart sounds are normal.  There is no murmur gallop rub or click.  The abdomen is soft and nontender.  Bowel sounds are normoactive.  There is no hepatosplenomegaly or mass.  There are no abdominal bruits.  Extremities reveal no phlebitis or edema.  Pedal pulses are good.  There is no cyanosis or clubbing.  Neurologic exam is normal strength and no lateralizing weakness.  No sensory deficits.  Integument reveals no rash  EKG today shows normal sinus rhythm.  When compared to previous tracing of 09/16/09, the T wave in aVL is now slightly inverted.  Assessment / Plan: 1.atypical chest pain.  Recent GI workup unremarkable. 2.  Hypercholesterolemia 3.  Diabetes mellitus with diabetic neuropathy 4.  Essential hypertension  without heart failure 5.  Osteoarthritis of knees 6.  Family history of ischemic heart disease  Disposition: We will have the patient return for a walking Lexiscan Myoview stress test. Many thanks for the opportunity to see this pleasant gentleman with you.  I will be in touch regarding the results of his stress test.  Return here as necessary

## 2014-06-25 ENCOUNTER — Ambulatory Visit (INDEPENDENT_AMBULATORY_CARE_PROVIDER_SITE_OTHER): Payer: Medicare Other | Admitting: Cardiovascular Disease

## 2014-06-25 ENCOUNTER — Encounter: Payer: Self-pay | Admitting: *Deleted

## 2014-06-25 ENCOUNTER — Ambulatory Visit (HOSPITAL_BASED_OUTPATIENT_CLINIC_OR_DEPARTMENT_OTHER): Payer: Medicare Other | Admitting: Radiology

## 2014-06-25 VITALS — BP 150/70 | HR 88 | Resp 12

## 2014-06-25 DIAGNOSIS — E780 Pure hypercholesterolemia: Secondary | ICD-10-CM | POA: Diagnosis not present

## 2014-06-25 DIAGNOSIS — R079 Chest pain, unspecified: Secondary | ICD-10-CM

## 2014-06-25 DIAGNOSIS — I10 Essential (primary) hypertension: Secondary | ICD-10-CM | POA: Diagnosis not present

## 2014-06-25 DIAGNOSIS — Z01812 Encounter for preprocedural laboratory examination: Secondary | ICD-10-CM

## 2014-06-25 DIAGNOSIS — E1169 Type 2 diabetes mellitus with other specified complication: Secondary | ICD-10-CM | POA: Diagnosis not present

## 2014-06-25 DIAGNOSIS — E119 Type 2 diabetes mellitus without complications: Secondary | ICD-10-CM

## 2014-06-25 DIAGNOSIS — I25119 Atherosclerotic heart disease of native coronary artery with unspecified angina pectoris: Secondary | ICD-10-CM | POA: Diagnosis not present

## 2014-06-25 DIAGNOSIS — Z794 Long term (current) use of insulin: Secondary | ICD-10-CM

## 2014-06-25 DIAGNOSIS — R0789 Other chest pain: Secondary | ICD-10-CM

## 2014-06-25 DIAGNOSIS — R9439 Abnormal result of other cardiovascular function study: Secondary | ICD-10-CM | POA: Diagnosis present

## 2014-06-25 DIAGNOSIS — I209 Angina pectoris, unspecified: Secondary | ICD-10-CM | POA: Diagnosis present

## 2014-06-25 LAB — CBC WITH DIFFERENTIAL/PLATELET
BASOS PCT: 0.3 % (ref 0.0–3.0)
Basophils Absolute: 0 10*3/uL (ref 0.0–0.1)
EOS PCT: 3.6 % (ref 0.0–5.0)
Eosinophils Absolute: 0.2 10*3/uL (ref 0.0–0.7)
HEMATOCRIT: 49.3 % (ref 39.0–52.0)
HEMOGLOBIN: 16 g/dL (ref 13.0–17.0)
Lymphocytes Relative: 19.2 % (ref 12.0–46.0)
Lymphs Abs: 1.3 10*3/uL (ref 0.7–4.0)
MCHC: 32.4 g/dL (ref 30.0–36.0)
MCV: 88.2 fl (ref 78.0–100.0)
MONO ABS: 0.5 10*3/uL (ref 0.1–1.0)
Monocytes Relative: 7.5 % (ref 3.0–12.0)
NEUTROS ABS: 4.5 10*3/uL (ref 1.4–7.7)
NEUTROS PCT: 69.4 % (ref 43.0–77.0)
Platelets: 204 10*3/uL (ref 150.0–400.0)
RBC: 5.6 Mil/uL (ref 4.22–5.81)
RDW: 15.9 % — ABNORMAL HIGH (ref 11.5–15.5)
WBC: 6.5 10*3/uL (ref 4.0–10.5)

## 2014-06-25 LAB — PROTIME-INR
INR: 1.1 ratio — ABNORMAL HIGH (ref 0.8–1.0)
Prothrombin Time: 12.5 s (ref 9.6–13.1)

## 2014-06-25 MED ORDER — TECHNETIUM TC 99M SESTAMIBI GENERIC - CARDIOLITE
33.0000 | Freq: Once | INTRAVENOUS | Status: AC | PRN
  Administered 2014-06-25: 33 via INTRAVENOUS

## 2014-06-25 MED ORDER — TECHNETIUM TC 99M SESTAMIBI GENERIC - CARDIOLITE
11.0000 | Freq: Once | INTRAVENOUS | Status: AC | PRN
  Administered 2014-06-25: 11 via INTRAVENOUS

## 2014-06-25 MED ORDER — REGADENOSON 0.4 MG/5ML IV SOLN
0.4000 mg | Freq: Once | INTRAVENOUS | Status: AC
  Administered 2014-06-25: 0.4 mg via INTRAVENOUS

## 2014-06-25 NOTE — Assessment & Plan Note (Signed)
Take 1/2 normal lantus dose tonight hold metformin

## 2014-06-25 NOTE — Progress Notes (Addendum)
[facility] [place], [place] [address] [phone]    Cardiology Nuclear Med Study  Seth Young is a 73 y.o. male     MRN : [id number]     DOB: Nov 06, 1940  Procedure Date: 06/25/2014  Nuclear Med Background Indication for Stress Test:  Evaluation for Ischemia History:  09/2009 MPI: EF: 65% NL  Cardiac Risk Factors: Hypertension, Lipids and IDDM  Symptoms:  Chest Pain and SOB   Nuclear Pre-Procedure Caffeine/Decaff Intake:  7:30pm NPO After: 7:30pm   Lungs:  clear O2 Sat: 95% on room air. IV 0.9% NS with Angio Cath:  22g  IV Site: R Hand  IV Started by:  Matilde Haymaker, RN  Chest Size (in):  52 Cup Size: n/a  Height: 5\' 11"  (1.803 m)  Weight:  242 lb (109.77 kg)  BMI:  Body mass index is 33.77 kg/(m^2). Tech Comments:  n/a    Nuclear Med Study 1 or 2 day study: 1 day  Stress Test Type:  Treadmill/Lexiscan  Reading MD: n/a  Order Authorizing Provider:  Henrene Dodge  Resting Radionuclide: Technetium 42m Sestamibi  Resting Radionuclide Dose: 11.0 mCi   Stress Radionuclide:  Technetium 64m Sestamibi  Stress Radionuclide Dose: 33.0 mCi           Stress Protocol Rest HR: 88 Stress HR: 122  Rest BP: 129/65 Stress BP: 160/72  Exercise Time (min): n/a METS: n/a   Predicted Max HR: 147 bpm % Max HR: 82.99 bpm Rate Pressure Product: 19520   Dose of Adenosine (mg):  n/a Dose of Lexiscan: 0.4 mg  Dose of Atropine (mg): n/a Dose of Dobutamine: n/a mcg/kg/min (at max HR)  Stress Test Technologist: Perrin Maltese, EMT-P  Nuclear Technologist:  Earl Many, CNMT     Rest Procedure:  Myocardial perfusion imaging was performed at rest 45 minutes following the intravenous administration of Technetium 42m Sestamibi. Rest ECG: NSR - Normal EKG  Stress Procedure:  The patient received IV Lexiscan 0.4 mg over 15-seconds.  Technetium 67m Sestamibi injected at 30-seconds.This patient had chest pressure with the Lexiscan  injection. Quantitative spect images were obtained after a 45 minute delay. Stress ECG: There was 13mm of horizontal ST segment depression in the inferior leads and 70mm of ST segment elevation in aVR  QPS Raw Data Images:  Diaphragmatic attenuation is noted; normal heart/lung ratio. Stress Images:  Large sized, moderate in intesity defect in the entire inferolateral wall and anterolateral wall. Rest Images:  Medium sized, moderate intensity defect in the mid and basal anterolateral wall Subtraction (SDS):  Large sized, moderate intensity, partially reversible defect in the anterolateral wall and reversible defect in the inferolateral wall. Transient Ischemic Dilatation (Normal <1.22):  1.01 Lung/Heart Ratio (Normal <0.45):  0.29  Quantitative Gated Spect Images QGS EDV:  104 ml QGS ESV:  47 ml  Impression Exercise Capacity:  Low level Lexiscan. BP Response:  Normal blood pressure response. Clinical Symptoms:  Chest pain was noted. ECG Impression:  55mm of horizontal ST segment depression in the inferior leads and 32mm of ST elevation in aVR. Comparison with Prior Nuclear Study: No images to compare  Overall Impression:  High risk stress nuclear study with a large sized, moderate in intensity defect involving the entire inferolateral and anterolateral walls.  This is consistent with inducible ischemia.  LV Ejection Fraction: 55%.  LV Wall Motion:  NL LV Function; NL Wall Motion    Signed: Fransico Him, MD [facility] 06/25/2014

## 2014-06-25 NOTE — Assessment & Plan Note (Signed)
Cholesterol is at goal.  Continue current dose of statin and diet Rx.  No myalgias or side effects.  F/U  LFT's in 6 months. No results found for: [facility]         Labs with Dr [name]

## 2014-06-25 NOTE — Assessment & Plan Note (Signed)
Well controlled.  Continue current medications and low sodium Dash type diet.    

## 2014-06-25 NOTE — Patient Instructions (Signed)
Your physician has requested that you have a cardiac catheterization. Cardiac catheterization is used to diagnose and/or treat various heart conditions. Doctors may recommend this procedure for a number of different reasons. The most common reason is to evaluate chest pain. Chest pain can be a symptom of coronary artery disease (CAD), and cardiac catheterization can show whether plaque is narrowing or blocking your heart's arteries. This procedure is also used to evaluate the valves, as well as measure the blood flow and oxygen levels in different parts of your heart. For further information please visit [email or web address]. Please follow instruction sheet, as given.   Your physician recommends that you return for lab work in: TODAY   BMET  CBC  INR

## 2014-06-25 NOTE — Assessment & Plan Note (Signed)
Markedly positive myovue with anterolateral ischemia apex to base EF 55%  Cath arranged with [facility] tomorrow  Orders written , labs to be done lab called Also discussed with wife

## 2014-06-25 NOTE — Progress Notes (Signed)
Patient ID: Seth Young, male   DOB: [birth year], 73 y.o.   MRN: [id number]  73 yo patient seen by Dr Mare Ferrari 4 days ago  Primary [name].  Had ? Atypical SSCP with negative GI w/u Pain is epigastric worse in recumbancy.  But also has exertional epigastric pains.  CRF HTN, DM and elevated lipids on Rx.  Had myovue today Lexiscan with chest pain  ST depression with infusion .  Images show marked anterolateral wall ischemia from apex to base.  EF 55%  Discussed findings with patient  Recommended cath in am  SL nitro to be called in.  Risks including MI, stroke , bleeding and CABG discussed willing to proceed.     ROS: Denies fever, malais, weight loss, blurry vision, decreased visual acuity, cough, sputum, SOB, hemoptysis, pleuritic pain, palpitaitons, heartburn, abdominal pain, melena, lower extremity edema, claudication, or rash.  All other systems reviewed and negative  General: Affect appropriate Healthy:  appears stated age 29: normal Neck supple with no adenopathy JVP normal no bruits no thyromegaly Lungs clear with no wheezing and good diaphragmatic motion Heart:  S1/S2 no murmur, no rub, gallop or click PMI normal Abdomen: benighn, BS positve, no tenderness, no AAA no bruit.  No HSM or HJR Distal pulses intact with no bruits No edema Neuro non-focal Skin warm and dry No muscular weakness   Current Outpatient Prescriptions  Medication Sig Dispense Refill  . Aflibercept (EYLEA IO) Inject into the eye daily.    Marland Kitchen amLODipine (NORVASC) 5 MG tablet Take 5 mg by mouth daily before breakfast.    . atorvastatin (LIPITOR) 10 MG tablet Take 10 mg by mouth daily before breakfast.    . Canagliflozin (INVOKANA PO) Take 1 tablet by mouth daily.    . celecoxib (CELEBREX) 400 MG capsule Take 400 mg by mouth daily.    . chlorthalidone (HYGROTON) 25 MG tablet Take 25 mg by mouth every other day.    . colchicine 0.6 MG tablet Take 0.6 mg by mouth as needed (gout).    Marland Kitchen desoximetasone  (TOPICORT) 0.25 % cream Apply 1 application topically as needed (licanplantusa).    . dicyclomine (BENTYL) 10 MG capsule Take 10 mg by mouth 2 (two) times daily.    . insulin glargine (LANTUS) 100 UNIT/ML injection Inject 70 Units into the skin at bedtime.     . insulin lispro (HUMALOG) 100 UNIT/ML injection Inject into the skin as needed for high blood sugar (sliding scale). Sliding scale      . lisinopril (PRINIVIL,ZESTRIL) 40 MG tablet Take 40 mg by mouth daily before breakfast.    . metFORMIN (GLUCOPHAGE) 1000 MG tablet Take 1,000 mg by mouth 2 (two) times daily with a meal.    . metoCLOPramide (REGLAN) 10 MG/10ML SOLN Take 10 mg by mouth daily after supper.    . NON FORMULARY once a week. Bydureon  shot once a week..    . omeprazole (PRILOSEC) 40 MG capsule Take 40 mg by mouth 2 (two) times daily.    . pregabalin (LYRICA) 50 MG capsule Take 50 mg by mouth every evening.     No current facility-administered medications for this visit.    Allergies  Morphine and related and Naproxen  Electrocardiogram:  NSR normal during lexiscan ST depression laterally  Assessment and Plan

## 2014-06-26 ENCOUNTER — Encounter (HOSPITAL_COMMUNITY)
Admission: RE | Disposition: A | Discharge status: Home / Self Care | Payer: Medicare Other | Source: Ambulatory Visit | Attending: Cardiovascular Disease

## 2014-06-26 ENCOUNTER — Encounter (HOSPITAL_COMMUNITY): Payer: Self-pay | Admitting: Cardiovascular Disease

## 2014-06-26 ENCOUNTER — Ambulatory Visit (HOSPITAL_COMMUNITY)
Admission: RE | Admit: 2014-06-26 | Discharge: 2014-06-27 | Disposition: A | Discharge status: Home / Self Care | Payer: Medicare Other | Source: Ambulatory Visit | Attending: Cardiovascular Disease | Admitting: Cardiovascular Disease

## 2014-06-26 DIAGNOSIS — E119 Type 2 diabetes mellitus without complications: Secondary | ICD-10-CM

## 2014-06-26 DIAGNOSIS — R9439 Abnormal result of other cardiovascular function study: Secondary | ICD-10-CM | POA: Diagnosis not present

## 2014-06-26 DIAGNOSIS — I2089 Other forms of angina pectoris: Secondary | ICD-10-CM

## 2014-06-26 DIAGNOSIS — I25119 Atherosclerotic heart disease of native coronary artery with unspecified angina pectoris: Secondary | ICD-10-CM | POA: Insufficient documentation

## 2014-06-26 DIAGNOSIS — I10 Essential (primary) hypertension: Secondary | ICD-10-CM | POA: Diagnosis not present

## 2014-06-26 DIAGNOSIS — I208 Other forms of angina pectoris: Secondary | ICD-10-CM | POA: Diagnosis present

## 2014-06-26 DIAGNOSIS — I25118 Atherosclerotic heart disease of native coronary artery with other forms of angina pectoris: Secondary | ICD-10-CM

## 2014-06-26 DIAGNOSIS — E1169 Type 2 diabetes mellitus with other specified complication: Secondary | ICD-10-CM | POA: Insufficient documentation

## 2014-06-26 DIAGNOSIS — IMO0001 Reserved for inherently not codable concepts without codable children: Secondary | ICD-10-CM

## 2014-06-26 DIAGNOSIS — Z955 Presence of coronary angioplasty implant and graft: Secondary | ICD-10-CM

## 2014-06-26 DIAGNOSIS — Z794 Long term (current) use of insulin: Secondary | ICD-10-CM | POA: Insufficient documentation

## 2014-06-26 DIAGNOSIS — E7800 Pure hypercholesterolemia, unspecified: Secondary | ICD-10-CM

## 2014-06-26 HISTORY — DX: Pure hypercholesterolemia, unspecified: E78.00

## 2014-06-26 HISTORY — PX: CORONARY ANGIOPLASTY WITH STENT PLACEMENT: SHX49

## 2014-06-26 HISTORY — PX: LEFT HEART CATHETERIZATION WITH CORONARY ANGIOGRAM: SHX5451

## 2014-06-26 HISTORY — DX: Pneumonia, unspecified organism: J18.9

## 2014-06-26 HISTORY — DX: Type 2 diabetes mellitus without complications: E11.9

## 2014-06-26 HISTORY — DX: Other forms of angina pectoris: I20.8

## 2014-06-26 HISTORY — DX: Basal cell carcinoma of skin, unspecified: C44.91

## 2014-06-26 HISTORY — DX: Gastro-esophageal reflux disease without esophagitis: K21.9

## 2014-06-26 HISTORY — DX: Other forms of angina pectoris: I20.89

## 2014-06-26 HISTORY — DX: Other chondrocalcinosis, unspecified site: M11.20

## 2014-06-26 LAB — GLUCOSE, CAPILLARY
GLUCOSE-CAPILLARY: 163 mg/dL — AB (ref 70–99)
GLUCOSE-CAPILLARY: 134 mg/dL — AB (ref 70–99)
GLUCOSE-CAPILLARY: 189 mg/dL — AB (ref 70–99)
Glucose-Capillary: 185 mg/dL — ABNORMAL HIGH (ref 70–99)

## 2014-06-26 LAB — CK TOTAL AND CKMB (NOT AT ARMC)
CK, MB: 5.2 ng/mL — ABNORMAL HIGH (ref 0.3–4.0)
Relative Index: 2.1 (ref 0.0–2.5)
Total CK: 253 U/L — ABNORMAL HIGH (ref 7–232)

## 2014-06-26 LAB — BASIC METABOLIC PANEL
BUN: 17 mg/dL (ref 6–23)
CHLORIDE: 101 meq/L (ref 96–112)
CO2: 26 mEq/L (ref 19–32)
CREATININE: 1.3 mg/dL (ref 0.4–1.5)
Calcium: 9.6 mg/dL (ref 8.4–10.5)
GFR: 72.71 mL/min (ref >60.00)
GLUCOSE: 205 mg/dL — AB (ref 70–99)
POTASSIUM: 4.7 meq/L (ref 3.5–5.1)
Sodium: 134 mEq/L — ABNORMAL LOW (ref 135–145)

## 2014-06-26 LAB — POCT ACTIVATED CLOTTING TIME: Activated Clotting Time: 495 seconds

## 2014-06-26 SURGERY — LEFT HEART CATHETERIZATION WITH CORONARY ANGIOGRAM
Anesthesia: LOCAL

## 2014-06-26 MED ORDER — ASPIRIN 81 MG PO CHEW
81.0000 mg | CHEWABLE_TABLET | Freq: Every day | ORAL | Status: DC
  Administered 2014-06-27: 81 mg via ORAL
  Filled 2014-06-26: qty 1

## 2014-06-26 MED ORDER — SODIUM CHLORIDE 0.9 % IJ SOLN: 3.0000 mL | Freq: Two times a day (BID) | INTRAMUSCULAR | Status: DC

## 2014-06-26 MED ORDER — ASPIRIN 81 MG PO CHEW
CHEWABLE_TABLET | ORAL | Status: AC
  Filled 2014-06-26: qty 1

## 2014-06-26 MED ORDER — METOCLOPRAMIDE HCL 10 MG PO TABS
10.0000 mg | ORAL_TABLET | Freq: Every day | ORAL | Status: DC
  Filled 2014-06-26: qty 1

## 2014-06-26 MED ORDER — SODIUM CHLORIDE 0.9 % IV SOLN: 1.0000 mL/kg/h | INTRAVENOUS | Status: AC

## 2014-06-26 MED ORDER — SODIUM CHLORIDE 0.9 % IJ SOLN: 3.0000 mL | INTRAMUSCULAR | Status: DC | PRN

## 2014-06-26 MED ORDER — MIDAZOLAM HCL 2 MG/2ML IJ SOLN
INTRAMUSCULAR | Status: AC
  Filled 2014-06-26: qty 2

## 2014-06-26 MED ORDER — FENTANYL CITRATE 0.05 MG/ML IJ SOLN
INTRAMUSCULAR | Status: AC
  Filled 2014-06-26: qty 2

## 2014-06-26 MED ORDER — VERAPAMIL HCL 2.5 MG/ML IV SOLN
INTRAVENOUS | Status: AC
  Filled 2014-06-26: qty 2

## 2014-06-26 MED ORDER — PRASUGREL HCL 10 MG PO TABS
ORAL_TABLET | ORAL | Status: AC
  Filled 2014-06-26: qty 6

## 2014-06-26 MED ORDER — AMLODIPINE BESYLATE 5 MG PO TABS
5.0000 mg | ORAL_TABLET | Freq: Every day | ORAL | Status: DC
  Administered 2014-06-27: 5 mg via ORAL
  Filled 2014-06-26 (×2): qty 1

## 2014-06-26 MED ORDER — DIPHENHYDRAMINE HCL 25 MG PO CAPS
25.0000 mg | ORAL_CAPSULE | Freq: Every evening | ORAL | Status: DC | PRN
  Administered 2014-06-26: 22:00:00 25 mg via ORAL
  Filled 2014-06-26: qty 1

## 2014-06-26 MED ORDER — HEPARIN SODIUM (PORCINE) 1000 UNIT/ML IJ SOLN
INTRAMUSCULAR | Status: AC
  Filled 2014-06-26: qty 1

## 2014-06-26 MED ORDER — PANTOPRAZOLE SODIUM 40 MG PO TBEC
40.0000 mg | DELAYED_RELEASE_TABLET | Freq: Every day | ORAL | Status: DC
  Administered 2014-06-27: 40 mg via ORAL
  Filled 2014-06-26: qty 1

## 2014-06-26 MED ORDER — ZOLPIDEM TARTRATE 5 MG PO TABS: 5.0000 mg | ORAL_TABLET | Freq: Every evening | ORAL | Status: DC | PRN

## 2014-06-26 MED ORDER — INSULIN GLARGINE 100 UNIT/ML ~~LOC~~ SOLN
70.0000 [IU] | Freq: Every day | SUBCUTANEOUS | Status: DC
  Administered 2014-06-26: 22:00:00 70 [IU] via SUBCUTANEOUS
  Filled 2014-06-26 (×2): qty 0.7

## 2014-06-26 MED ORDER — ATORVASTATIN CALCIUM 10 MG PO TABS
10.0000 mg | ORAL_TABLET | Freq: Every day | ORAL | Status: DC
  Administered 2014-06-27: 07:00:00 10 mg via ORAL
  Filled 2014-06-26 (×2): qty 1

## 2014-06-26 MED ORDER — LIDOCAINE HCL (PF) 1 % IJ SOLN
INTRAMUSCULAR | Status: AC
  Filled 2014-06-26: qty 30

## 2014-06-26 MED ORDER — BIVALIRUDIN 250 MG IV SOLR
INTRAVENOUS | Status: AC
  Filled 2014-06-26: qty 250

## 2014-06-26 MED ORDER — SODIUM CHLORIDE 0.9 % IV SOLN: 250.0000 mL | INTRAVENOUS | Status: DC | PRN

## 2014-06-26 MED ORDER — PRASUGREL HCL 10 MG PO TABS
10.0000 mg | ORAL_TABLET | Freq: Every day | ORAL | Status: DC
  Administered 2014-06-27: 10 mg via ORAL
  Filled 2014-06-26: qty 1

## 2014-06-26 MED ORDER — COLCHICINE 0.6 MG PO TABS
0.6000 mg | ORAL_TABLET | ORAL | Status: DC | PRN
  Filled 2014-06-26: qty 1

## 2014-06-26 MED ORDER — PREGABALIN 25 MG PO CAPS
50.0000 mg | ORAL_CAPSULE | Freq: Every evening | ORAL | Status: DC
  Administered 2014-06-26: 18:00:00 50 mg via ORAL
  Filled 2014-06-26: qty 2

## 2014-06-26 MED ORDER — CHLORTHALIDONE 25 MG PO TABS
25.0000 mg | ORAL_TABLET | Freq: Every day | ORAL | Status: DC
  Administered 2014-06-27: 09:00:00 25 mg via ORAL
  Filled 2014-06-26: qty 1

## 2014-06-26 MED ORDER — ASPIRIN 81 MG PO CHEW
81.0000 mg | CHEWABLE_TABLET | ORAL | Status: AC
  Administered 2014-06-26: 81 mg via ORAL

## 2014-06-26 MED ORDER — LISINOPRIL 40 MG PO TABS
40.0000 mg | ORAL_TABLET | Freq: Every day | ORAL | Status: DC
  Administered 2014-06-27: 40 mg via ORAL
  Filled 2014-06-26 (×2): qty 1

## 2014-06-26 MED ORDER — ACETAMINOPHEN 325 MG PO TABS
650.0000 mg | ORAL_TABLET | ORAL | Status: DC | PRN
  Administered 2014-06-26: 22:00:00 650 mg via ORAL
  Filled 2014-06-26: qty 2

## 2014-06-26 MED ORDER — HEPARIN (PORCINE) IN NACL 2-0.9 UNIT/ML-% IJ SOLN
INTRAMUSCULAR | Status: AC
  Filled 2014-06-26: qty 1500

## 2014-06-26 MED ORDER — DIPHENHYDRAMINE HCL 25 MG PO CAPS: 25.0000 mg | ORAL_CAPSULE | Freq: Every evening | ORAL | Status: DC | PRN

## 2014-06-26 MED ORDER — CANAGLIFLOZIN 100 MG PO TABS
100.0000 mg | ORAL_TABLET | Freq: Every day | ORAL | Status: DC
  Administered 2014-06-27: 100 mg via ORAL
  Filled 2014-06-26: qty 1

## 2014-06-26 MED ORDER — DICYCLOMINE HCL 10 MG PO CAPS
10.0000 mg | ORAL_CAPSULE | Freq: Two times a day (BID) | ORAL | Status: DC
  Filled 2014-06-26 (×3): qty 1

## 2014-06-26 MED ORDER — ONDANSETRON HCL 4 MG/2ML IJ SOLN: 4.0000 mg | Freq: Four times a day (QID) | INTRAMUSCULAR | Status: DC | PRN

## 2014-06-26 MED ORDER — SODIUM CHLORIDE 0.9 % IV SOLN
INTRAVENOUS | Status: DC
  Administered 2014-06-26: 10:00:00 via INTRAVENOUS

## 2014-06-26 MED ORDER — NITROGLYCERIN 1 MG/10 ML FOR IR/CATH LAB
INTRA_ARTERIAL | Status: AC
  Filled 2014-06-26: qty 10

## 2014-06-26 NOTE — H&P (View-Only) (Signed)
Patient ID: Seth Young, male   DOB: 1941/04/10, 73 y.o.   MRN: [id number]  73 yo patient seen by Dr Mare Ferrari 4 days ago  Primary Seth Young.  Had ? Atypical SSCP with negative GI w/u Pain is epigastric worse in recumbancy.  But also has exertional epigastric pains.  CRF HTN, DM and elevated lipids on Rx.  Had myovue today Lexiscan with chest pain  ST depression with infusion .  Images show marked anterolateral wall ischemia from apex to base.  EF 55%  Discussed findings with patient  Recommended cath in am  SL nitro to be called in.  Risks including MI, stroke , bleeding and CABG discussed willing to proceed.     ROS: Denies fever, malais, weight loss, blurry vision, decreased visual acuity, cough, sputum, SOB, hemoptysis, pleuritic pain, palpitaitons, heartburn, abdominal pain, melena, lower extremity edema, claudication, or rash.  All other systems reviewed and negative  General: Affect appropriate Healthy:  appears stated age 12: normal Neck supple with no adenopathy JVP normal no bruits no thyromegaly Lungs clear with no wheezing and good diaphragmatic motion Heart:  S1/S2 no murmur, no rub, gallop or click PMI normal Abdomen: benighn, BS positve, no tenderness, no AAA no bruit.  No HSM or HJR Distal pulses intact with no bruits No edema Neuro non-focal Skin warm and dry No muscular weakness   Current Outpatient Prescriptions  Medication Sig Dispense Refill  . Aflibercept (EYLEA IO) Inject into the eye daily.    Marland Kitchen amLODipine (NORVASC) 5 MG tablet Take 5 mg by mouth daily before breakfast.    . atorvastatin (LIPITOR) 10 MG tablet Take 10 mg by mouth daily before breakfast.    . Canagliflozin (INVOKANA PO) Take 1 tablet by mouth daily.    . celecoxib (CELEBREX) 400 MG capsule Take 400 mg by mouth daily.    . chlorthalidone (HYGROTON) 25 MG tablet Take 25 mg by mouth every other day.    . colchicine 0.6 MG tablet Take 0.6 mg by mouth as needed (gout).    Marland Kitchen desoximetasone  (TOPICORT) 0.25 % cream Apply 1 application topically as needed (licanplantusa).    . dicyclomine (BENTYL) 10 MG capsule Take 10 mg by mouth 2 (two) times daily.    . insulin glargine (LANTUS) 100 UNIT/ML injection Inject 70 Units into the skin at bedtime.     . insulin lispro (HUMALOG) 100 UNIT/ML injection Inject into the skin as needed for high blood sugar (sliding scale). Sliding scale      . lisinopril (PRINIVIL,ZESTRIL) 40 MG tablet Take 40 mg by mouth daily before breakfast.    . metFORMIN (GLUCOPHAGE) 1000 MG tablet Take 1,000 mg by mouth 2 (two) times daily with a meal.    . metoCLOPramide (REGLAN) 10 MG/10ML SOLN Take 10 mg by mouth daily after supper.    . NON FORMULARY once a week. Bydureon  shot once a week..    . omeprazole (PRILOSEC) 40 MG capsule Take 40 mg by mouth 2 (two) times daily.    . pregabalin (LYRICA) 50 MG capsule Take 50 mg by mouth every evening.     No current facility-administered medications for this visit.    Allergies  Morphine and related and Naproxen  Electrocardiogram:  NSR normal during lexiscan ST depression laterally  Assessment and Plan

## 2014-06-26 NOTE — CV Procedure (Signed)
Cardiac Catheterization Procedure Note  Name: Seth Young MRN: [id number] DOB: [birth year]  Procedure: Left Heart Cath, Selective Coronary Angiography, PTCA and stenting of the first OM  Indication: Progressive now CCS Class 3-4 angina, High-risk Myoview. The patient was consented to the Bioflow Study (Orsirus bioabsorbable DES polymer versus Xience DES).   Procedural Details:  The right wrist was prepped, draped, and anesthetized with 1% lidocaine. Using the modified Seldinger technique, a 5/6 French Slender sheath was introduced into the right radial artery. 3 mg of verapamil was administered through the sheath, weight-based unfractionated heparin was administered intravenously. Standard Judkins catheters were used for selective coronary angiography, LV pressure was recorded with a pigtail catheter. Catheter exchanges were performed over an exchange length guidewire.  PROCEDURAL FINDINGS Hemodynamics: AO 108/59 LV 112/11   Coronary angiography: Coronary dominance: right  Left mainstem: The left main is widely patent without obstruction. It divides into the LAD and left circumflex.  Left anterior descending (LAD): The LAD has diffuse plaquing throughout the proximal, mid, and distal vessel. There is 50% proximal LAD stenosis, 25-30% mid LAD stenosis, and 50-60% diffuse apical LAD stenosis. The first diagonal branch is severely diseased in diffuse fashion with 80% ostial and 70% mid vessel stenosis.  Left circumflex (LCx): The left circumflex is diffusely diseased. It is a large caliber vessel. The proximal circumflex has 50% stenosis. The mid circumflex has scattered 30% stenoses. The first obtuse marginal is subtotally occluded with 99% stenosis. The second obtuse marginal is very small with 70% ostial stenosis. The left posterior lateral branches are large in caliber without significant disease.  Right coronary artery (RCA): The right coronary artery is dominant. The vessel has  very severe distal stenosis in a diabetic pattern with diffuse disease noted distally. The proximal vessel has diffuse irregularity. The entire mid vessel has 80% stenosis. The PDA branch has 95% stenosis and it is extremely small in caliber, visually less than 1 mm.  Left ventriculography: Deferred, normal LV function by Nuclear Scan (LVEF 55%)  PCI Note:  Following the diagnostic procedure, the decision was made to proceed with PCI of the OM1. The patient has severe diffuse CAD in a distal vessel pattern. Without significant proximal LAD stenosis, and with very poor targets for CABG, I felt he would be best treated with culprit vessel PCI. He was loaded with Effient 60 mg in the setting of diabetes and elevated CKMB. Weight-based bivalirudin was given for anticoagulation. Once a therapeutic ACT was achieved, a 6 [place] EBU guide catheter was inserted.  A Whisper coronary guidewire was used to cross the lesion.  The lesion was predilated with a 2.0x15 mm balloon.  The lesion was then stented with a 3.0x18 mm Study Stent (Orsirus).  The stent was postdilated with a 3.25 mm noncompliant balloon to 14 atm.  Following PCI, there was 0% residual stenosis and TIMI-3 flow. Final angiography confirmed an excellent result. The patient tolerated the procedure well. There were no immediate procedural complications. A TR band was used for radial hemostasis. The patient was transferred to the post catheterization recovery area for further monitoring.  PCI Data: Vessel - OM1/Segment - mid Percent Stenosis (pre)  99 TIMI-flow 3 Stent 3.0x18 mm Orsirus DES (study stent) Percent Stenosis (post) 0 TIMI-flow (post) 3  Estimated Blood Loss: minimal  Final Conclusions:   1. Severe 3 vessel CAD in a diffuse, distal vessel, diabetic pattern 2. Severe OM1 stenosis (likely culprit lesion), treated successfully with PCI using a single DES per  the Bioflow protocol 3. Known normal LV function    Recommendations:    ASA/Effient x 12 months, then consider long-term DAPT considering diabetes and severe diffuse CAD pattern. Pt has very poor targets for grafting, but could consider surgical referral down the road if he develops proximal LAD stenosis. Will need aggressive med management for risk reduction and treatment of angina.   Sherren Mocha MD, [facility] 06/26/2014, 2:32 PM

## 2014-06-26 NOTE — Progress Notes (Signed)
TR BAND REMOVAL  LOCATION:    right radial  DEFLATED PER PROTOCOL:    Yes.    TIME BAND OFF / DRESSING APPLIED:    1700   SITE UPON ARRIVAL:    Level 0  SITE AFTER BAND REMOVAL:    Level 0  REVERSE ALLEN'S TEST:     positive  CIRCULATION SENSATION AND MOVEMENT:    Within Normal Limits   Yes.    COMMENTS:   Tolerated procedure well 

## 2014-06-26 NOTE — Interval H&P Note (Signed)
History and Physical Interval Note:  06/26/2014 11:50 AM  Seth Young  has presented today for surgery, with the diagnosis of abnormal mioview  The various methods of treatment have been discussed with the patient and family. After consideration of risks, benefits and other options for treatment, the patient has consented to  Procedure(s): LEFT HEART CATHETERIZATION WITH CORONARY ANGIOGRAM (N/A) as a surgical intervention .  The patient's history has been reviewed, patient examined, no change in status, stable for surgery.  I have reviewed the patient's chart and labs.  Questions were answered to the patient's satisfaction.    Cath Lab Visit (complete for each Cath Lab visit)  Clinical Evaluation Leading to the Procedure:   ACS: No.  Non-ACS:    Anginal Classification: CCS III  Anti-ischemic medical therapy: Minimal Therapy (1 class of medications)  Non-Invasive Test Results: High-risk stress test findings: cardiac mortality >3%/year  Prior CABG: No previous CABG       Seth Young

## 2014-06-26 NOTE — Research (Signed)
BIOFLOW-V Research Study Informed Consent   Subject Name: Seth Young  Subject met inclusion and exclusion criteria.  The informed consent form, study requirements and expectations were reviewed with the subject and questions and concerns were addressed prior to the signing of the consent form.  The subject verbalized understanding of the trail requirements.  The subject agreed to participate in the BIOFLOW-V trial and signed the informed consent at 1140 on 06/26/2014.  The informed consent was obtained prior to performance of any protocol-specific procedures for the subject.  A copy of the signed informed consent was given to the subject and a copy was placed in the subject's medical record.  Blossom Hoops 06/26/2014, 3:24 PM

## 2014-06-27 ENCOUNTER — Encounter (HOSPITAL_COMMUNITY): Payer: Self-pay | Admitting: Physician Assistant

## 2014-06-27 DIAGNOSIS — I208 Other forms of angina pectoris: Secondary | ICD-10-CM

## 2014-06-27 DIAGNOSIS — R9439 Abnormal result of other cardiovascular function study: Secondary | ICD-10-CM | POA: Diagnosis not present

## 2014-06-27 DIAGNOSIS — I25119 Atherosclerotic heart disease of native coronary artery with unspecified angina pectoris: Secondary | ICD-10-CM | POA: Diagnosis not present

## 2014-06-27 DIAGNOSIS — I10 Essential (primary) hypertension: Secondary | ICD-10-CM | POA: Diagnosis not present

## 2014-06-27 DIAGNOSIS — E1169 Type 2 diabetes mellitus with other specified complication: Secondary | ICD-10-CM | POA: Diagnosis not present

## 2014-06-27 LAB — CBC
HCT: 45.3 % (ref 39.0–52.0)
Hemoglobin: 14.7 g/dL (ref 13.0–17.0)
MCH: 28.4 pg (ref 26.0–34.0)
MCHC: 32.5 g/dL (ref 30.0–36.0)
MCV: 87.6 fL (ref 78.0–100.0)
Platelets: 184 10*3/uL (ref 150–400)
RBC: 5.17 MIL/uL (ref 4.22–5.81)
RDW: 15.3 % (ref 11.5–15.5)
WBC: 5.5 10*3/uL (ref 4.0–10.5)

## 2014-06-27 LAB — BASIC METABOLIC PANEL
Anion gap: 15 (ref 5–15)
BUN: 17 mg/dL (ref 6–23)
CALCIUM: 9.5 mg/dL (ref 8.4–10.5)
CHLORIDE: 101 meq/L (ref 96–112)
CO2: 21 mEq/L (ref 19–32)
Creatinine, Ser: 1.14 mg/dL (ref 0.50–1.35)
GFR calc Af Amer: 72 mL/min — ABNORMAL LOW (ref >90)
GFR calc non Af Amer: 62 mL/min — ABNORMAL LOW (ref >90)
Glucose, Bld: 141 mg/dL — ABNORMAL HIGH (ref 70–99)
Potassium: 4.2 mEq/L (ref 3.7–5.3)
Sodium: 137 mEq/L (ref 137–147)

## 2014-06-27 LAB — CK TOTAL AND CKMB (NOT AT ARMC)
CK, MB: 4.9 ng/mL — ABNORMAL HIGH (ref 0.3–4.0)
RELATIVE INDEX: 2.3 (ref 0.0–2.5)
Total CK: 215 U/L (ref 7–232)

## 2014-06-27 LAB — GLUCOSE, CAPILLARY: Glucose-Capillary: 149 mg/dL — ABNORMAL HIGH (ref 70–99)

## 2014-06-27 MED ORDER — PRASUGREL HCL 10 MG PO TABS: 10.0000 mg | ORAL_TABLET | Freq: Every day | ORAL | Status: DC

## 2014-06-27 MED ORDER — METOPROLOL TARTRATE 25 MG PO TABS: 25.0000 mg | ORAL_TABLET | Freq: Two times a day (BID) | ORAL | Status: DC

## 2014-06-27 MED ORDER — METFORMIN HCL 1000 MG PO TABS: 1000.0000 mg | ORAL_TABLET | Freq: Two times a day (BID) | ORAL | Status: DC

## 2014-06-27 MED ORDER — ASPIRIN 81 MG PO TABS: 81.0000 mg | ORAL_TABLET | Freq: Every day | ORAL | Status: AC

## 2014-06-27 MED ORDER — ATORVASTATIN CALCIUM 40 MG PO TABS: 40.0000 mg | ORAL_TABLET | Freq: Every day | ORAL | Status: DC

## 2014-06-27 MED FILL — Sodium Chloride IV Soln 0.9%: INTRAVENOUS | Qty: 50 | Status: AC

## 2014-06-27 NOTE — Care Management Note (Signed)
    Page 1 of 1   06/27/2014     10:02:45 AM CARE MANAGEMENT NOTE 06/27/2014  Patient:  Seth Young, Seth Young   Account Number:  [id number]  Date Initiated:  06/27/2014  Documentation initiated by:  Damire Remedios  Subjective/Objective Assessment:   Pt s/p PCI with DES on 06/26/14.  PTA, pt independent, lives with spouse.     Action/Plan:   Pt started on Effient.  Will check coverage for med.  Pt given Effient booklet and 30 day free trial card.   Anticipated DC Date:  06/27/2014   Anticipated DC Plan:  [place]  CM consult  Medication Assistance      Choice offered to / List presented to:             Status of service:  Completed, signed off Medicare Important Message given?  NA - LOS <3 / Initial given by admissions (If response is "NO", the following Medicare IM given date fields will be blank) Date Medicare IM given:   Medicare IM given by:   Date Additional Medicare IM given:   Additional Medicare IM given by:    Discharge Disposition:  HOME/SELF CARE  Per UR Regulation:  Reviewed for med. necessity/level of care/duration of stay  If discussed at [place] of Stay Meetings, dates discussed:    Comments:  06/27/14 Ellan Lambert, RN, BSN [phone]  PT COPAY WILL BE $6.60- [place] IS NOT REQUIRED

## 2014-06-27 NOTE — Discharge Summary (Signed)
CARDIOLOGY DISCHARGE SUMMARY   Patient ID: Seth Young MRN: [id number] DOB/AGE: 1941/07/01 73 y.o.  Admit date: 06/26/2014 Discharge date: 06/27/2014  PCP: Geoffery Lyons, MD Primary Cardiologist: Dr. Mare Ferrari  Primary Discharge Diagnosis: Exertional angina Secondary Discharge Diagnosis:    IDDM (insulin dependent diabetes mellitus)   Essential hypertension   Hypercholesterolemia  Procedures: Left Heart Cath, Selective Coronary Angiography, PTCA and stenting of the first OM - Bioflow Study (Orsirus bioabsorbable DES polymer versus Xience DES).    Hospital Course: Seth Young is a 73 y.o. male with no history of CAD. He had chest pain was evaluated with stress test which was abnormal. Cardiac catheterization was recommended and he came to the hospital on 11/18 for the procedure.  Cardiac catheterization results are below. He had diffuse severe diabetic disease, but the OM1 was felt to be the culprit vessel and was treated with the DES per the Bio flow protocol. His EF is preserved. He tolerated the procedure well.  On 11/19, he was seen by Dr. Burt Knack and all data were reviewed. He was placed on Effient and baby aspirin for dual antiplatelet therapy. Dr. Burt Knack recommended a beta blocker and 1 was added to his medication regimen. Because of the severity of his disease, his statin was increased to 40 mg of Lipitor daily.  He was seen by cardiac rehabilitation and educated on stat restrictions, exercise guidelines and heart-healthy lifestyle modifications. He is encouraged to follow-up with cardiac rehabilitation as an outpatient.  Seth Young was ambulating without chest pain or shortness of breath and considered stable for discharge, to follow up as an outpatient  Labs:   Lab Results  Component Value Date   WBC 5.5 06/27/2014   HGB 14.7 06/27/2014   HCT 45.3 06/27/2014   MCV 87.6 06/27/2014   PLT 184 06/27/2014     Recent Labs Lab 06/27/14 0406  NA 137  K  4.2  CL 101  CO2 21  BUN 17  CREATININE 1.14  CALCIUM 9.5  GLUCOSE 141*    Recent Labs  06/26/14 0950 06/27/14 0406  CKTOTAL 253* 215  CKMB 5.2* 4.9*    Recent Labs  06/25/14 1257  INR 1.1*    Cardiac Cath: 06/26/2014 Left mainstem: The left main is widely patent without obstruction. It divides into the LAD and left circumflex. Left anterior descending (LAD): The LAD has diffuse plaquing throughout the proximal, mid, and distal vessel. There is 50% proximal LAD stenosis, 25-30% mid LAD stenosis, and 50-60% diffuse apical LAD stenosis. The first diagonal branch is severely diseased in diffuse fashion with 80% ostial and 70% mid vessel stenosis. Left circumflex (LCx): The left circumflex is diffusely diseased. It is a large caliber vessel. The proximal circumflex has 50% stenosis. The mid circumflex has scattered 30% stenoses. The first obtuse marginal is subtotally occluded with 99% stenosis. The second obtuse marginal is very small with 70% ostial stenosis. The left posterior lateral branches are large in caliber without significant disease. Right coronary artery (RCA): The right coronary artery is dominant. The vessel has very severe distal stenosis in a diabetic pattern with diffuse disease noted distally. The proximal vessel has diffuse irregularity. The entire mid vessel has 80% stenosis. The PDA branch has 95% stenosis and it is extremely small in caliber, visually less than 1 mm. Left ventriculography: Deferred, normal LV function by Nuclear Scan (LVEF 55%) PCI Data: Vessel - OM1/Segment - mid Percent Stenosis (pre) 99 TIMI-flow 3 Stent 3.0x18 mm Orsirus DES (study  stent) Percent Stenosis (post) 0 TIMI-flow (post) 3 Estimated Blood Loss: minimal Final Conclusions:  1. Severe 3 vessel CAD in a diffuse, distal vessel, diabetic pattern 2. Severe OM1 stenosis (likely culprit lesion), treated successfully with PCI using a single DES per the Bioflow protocol 3. Known normal  LV function   Recommendations:  ASA/Effient x 12 months, then consider long-term DAPT considering diabetes and severe diffuse CAD pattern. Pt has very poor targets for grafting, but could consider surgical referral down the road if he develops proximal LAD stenosis. Will need aggressive med management for risk reduction and treatment of angina.   EKG: SR, no acute ischemic changes  FOLLOW UP PLANS AND APPOINTMENTS Allergies  Allergen Reactions  . Morphine And Related Itching    Itching   . Naproxen Other (See Comments)    Tremors, hallucinates      Medication List    TAKE these medications        amLODipine 5 MG tablet  Commonly known as:  NORVASC  Take 5 mg by mouth daily before breakfast.     aspirin 81 MG tablet  Take 1 tablet (81 mg total) by mouth daily.  Notes to Patient:  81 mg taken today     atorvastatin 40 MG tablet  Commonly known as:  LIPITOR  Take 1 tablet (40 mg total) by mouth daily before breakfast.  Notes to Patient:  NEW MED     celecoxib 400 MG capsule  Commonly known as:  CELEBREX  Take 400 mg by mouth daily.     chlorthalidone 25 MG tablet  Commonly known as:  HYGROTON  Take 25 mg by mouth daily.     colchicine 0.6 MG tablet  Take 0.6 mg by mouth as needed (gout).     desoximetasone 0.25 % cream  Commonly known as:  TOPICORT  Apply 1 application topically as needed (licanplantusa).     dicyclomine 10 MG capsule  Commonly known as:  BENTYL  Take 10 mg by mouth 2 (two) times daily.     EYLEA IO  Inject into the eye every 5 (five) weeks.     insulin glargine 100 UNIT/ML injection  Commonly known as:  LANTUS  Inject 70 Units into the skin at bedtime.     insulin lispro 100 UNIT/ML injection  Commonly known as:  HUMALOG  - Inject 30 Units into the skin as needed for high blood sugar (sliding scale). Sliding scale  -   -      INVOKANA PO  Take 100 mg by mouth daily.     lisinopril 40 MG tablet  Commonly known as:   PRINIVIL,ZESTRIL  Take 40 mg by mouth daily before breakfast.     metFORMIN 1000 MG tablet  Commonly known as:  GLUCOPHAGE  Take 1 tablet (1,000 mg total) by mouth 2 (two) times daily with a meal. HOLD for 48 hours, restart on 11/21.     metoCLOPramide 10 MG tablet  Commonly known as:  REGLAN  Take 10 mg by mouth daily before supper.     metoprolol tartrate 25 MG tablet  Commonly known as:  LOPRESSOR  Take 1 tablet (25 mg total) by mouth 2 (two) times daily.  Notes to Patient:  NEW MED     NON FORMULARY  - once a week. Bydureon  -  shot once a week.Marland Kitchen     omeprazole 40 MG capsule  Commonly known as:  PRILOSEC  Take 40 mg by mouth 2 (two) times  daily.     prasugrel 10 MG Tabs tablet  Commonly known as:  EFFIENT  Take 1 tablet (10 mg total) by mouth daily.  Notes to Patient:  NEW MED     pregabalin 50 MG capsule  Commonly known as:  LYRICA  Take 50 mg by mouth every evening.        Discharge Instructions    Amb Referral to Cardiac Rehabilitation    Complete by:  As directed      Diet - low sodium heart healthy    Complete by:  As directed      Diet Carb Modified    Complete by:  As directed      Increase activity slowly    Complete by:  As directed           Follow-up Information    Follow up with Darlin Coco, MD.   Specialty:  Cardiology   Why:  The office will call   Contact information:   [address] [place] [address] [phone]       Follow up with Geoffery Lyons, MD.   Specialty:  Internal Medicine   Contact information:   [address] [place] [address] [phone]       BRING [place]  Time spent with patient to include physician time: 43 min Signed: Rosaria Ferries, PA-C 06/27/2014, 3:55 PM Co-Sign MD

## 2014-06-27 NOTE — Progress Notes (Signed)
CARDIAC REHAB PHASE I   PRE:  Rate/Rhythm: 100 SR  BP:  Supine: 131/64  Sitting:   Standing:    SaO2:   MODE:  Ambulation: 700 ft   POST:  Rate/Rhythm: 111 ST  BP:  Supine:   Sitting: 148/66  Standing:    SaO2:  2111-7356 Pt walked 700 ft with steady gait. No CP. Tolerated well. Education completed with pt who voiced understanding. Discussed CRP 2 and pt gave permission for referral to [facility] program. Has effient booklet and stressed importance of taking with DES. Gave pt heart healthy and diabetic diets and reviewed carb counting. Discussed NTG use.   Graylon Good, RN BSN  06/27/2014 8:49 AM

## 2014-06-27 NOTE — Progress Notes (Signed)
Patient Name: Seth Young Date of Encounter: 06/27/2014  Principal Problem:   Exertional angina Active Problems:   IDDM (insulin dependent diabetes mellitus)   Essential hypertension   Hypercholesterolemia   Primary Cardiologist:  Dr. Mare Ferrari Patient Profile: 73 yo male w/ no previous hx CAD was seen for chest pain, abnl MV, cath 11/18 w/ DES OM.  SUBJECTIVE: No chest pain, no shortness of breath overnight. Wants to make sure it's okay for him to travel on Thanksgiving day, 5 hours by car but he does not have to do all the driving.  OBJECTIVE Filed Vitals:   06/26/14 1401 06/26/14 1700 06/26/14 1935 06/27/14 0001  BP: 151/68 123/75 122/75 139/67  Pulse: 98 85  86  Temp: 97.7 F (36.5 C) 98 F (36.7 C) 98.5 F (36.9 C) 97.6 F (36.4 C)  TempSrc: Oral Oral Oral Oral  Resp: 18 18 20 20   Height:      Weight:    244 lb 14.9 oz (111.1 kg)  SpO2: 98% 98%  98%    Intake/Output Summary (Last 24 hours) at 06/27/14 0631 Last data filed at 06/27/14 0200  Gross per 24 hour  Intake 959.63 ml  Output   2500 ml  Net -1540.37 ml   Filed Weights   06/26/14 0923 06/27/14 0001  Weight: 244 lb (110.678 kg) 244 lb 14.9 oz (111.1 kg)    PHYSICAL EXAM General: Well developed, well nourished, male in no acute distress. Head: Normocephalic, atraumatic.  Neck: Supple without bruits, JVD not elevated. Lungs:  Resp regular and unlabored, CTA. Heart: RRR, S1, S2, no S3, S4, or murmur; no rub. Abdomen: Soft, non-tender, non-distended, BS + x 4.  Extremities: No clubbing, cyanosis, edema. Right radial cath site without ecchymosis or hematoma  Neuro: Alert and oriented X 3. Moves all extremities spontaneously. Psych: Normal affect.  LABS: CBC:  Recent Labs  06/25/14 1257 06/27/14 0406  WBC 6.5 5.5  NEUTROABS 4.5  --   HGB 16.0 14.7  HCT 49.3 45.3  MCV 88.2 87.6  PLT 204.0 184   INR:  Recent Labs  06/25/14 1257  INR 1.1*   Basic Metabolic Panel:  Recent Labs  06/25/14 1257 06/27/14 0406  NA 134* 137  K 4.7 4.2  CL 101 101  CO2 26 21  GLUCOSE 205* 141*  BUN 17 17  CREATININE 1.3 1.14  CALCIUM 9.6 9.5   Cardiac Enzymes:  Recent Labs  06/26/14 0950 06/27/14 0406  CKTOTAL 253* 215  CKMB 5.2* 4.9*    TELE: Sinus rhythm, no significant ectopy        Current Medications:  . amLODipine  5 mg Oral QAC breakfast  . aspirin  81 mg Oral Daily  . atorvastatin  10 mg Oral QAC breakfast  . canagliflozin  100 mg Oral Daily  . chlorthalidone  25 mg Oral Daily  . dicyclomine  10 mg Oral BID  . insulin glargine  70 Units Subcutaneous QHS  . lisinopril  40 mg Oral QAC breakfast  . metoCLOPramide  10 mg Oral QAC supper  . pantoprazole  40 mg Oral Daily  . prasugrel  10 mg Oral Daily  . pregabalin  50 mg Oral QPM  . sodium chloride  3 mL Intravenous Q12H      ASSESSMENT AND PLAN: Principal Problem:   Exertional angina: s/p cath with: Final Conclusions:  1. Severe 3 vessel CAD in a diffuse, distal vessel, diabetic pattern 2. Severe OM1 stenosis (likely culprit lesion), treated  successfully with PCI using a single DES per the Bioflow protocol 3. Known normal LV function   Recommendations:  ASA/Effient x 12 months, then consider long-term DAPT considering diabetes and severe diffuse CAD pattern. Pt has very poor targets for grafting, but could consider surgical referral down the road if he develops proximal LAD stenosis. Will need aggressive med management for risk reduction and treatment of angina.   CKMB slightly elevated, index is normal. On ASA, statin, ACE, Effient. M.D. advise on adding a beta blocker.    essential Hypertension: Systolic blood pressure 233I through 170s since admission, heart rate 66 are better. On amlodipine 5 mg daily and lisinopril 40 mg daily. Consider beta blocker  Hyperlipidemia: He is on his home dose of Lipitor, 10 mg daily.  Lipid profile recently checked by Dr. Joya Salm, results not currently available.    Diabetes: He is on his home dose of insulin but not currently on sliding scale.   plan: Cardiac rehabilitation to see. Discharge this a.m. Continue to hold Glucophage for 48 hours. Follow up as outpatient.  Jonetta Speak , PA-C 6:31 AM 06/27/2014

## 2014-06-27 NOTE — Discharge Instructions (Signed)
PLEASE REMEMBER TO BRING ALL OF YOUR MEDICATIONS TO EACH OF YOUR FOLLOW-UP OFFICE VISITS. ° °PLEASE ATTEND ALL SCHEDULED FOLLOW-UP APPOINTMENTS.  ° °Activity: Increase activity slowly as tolerated. You may shower, but no soaking baths (or swimming) for 1 week. No driving for 2 days. No lifting over 5 lbs for 1 week. No sexual activity for 1 week.  ° °You May Return to Work: in 1 week (if applicable) ° °Wound Care: You may wash cath site gently with soap and water. Keep cath site clean and dry. If you notice pain, swelling, bleeding or pus at your cath site, please call [phone]. ° ° ° °Cardiac Cath Site Care °Refer to this sheet in the next few weeks. These instructions provide you with information on caring for yourself after your procedure. Your caregiver may also give you more specific instructions. Your treatment has been planned according to current medical practices, but problems sometimes occur. Call your caregiver if you have any problems or questions after your procedure. °HOME CARE INSTRUCTIONS °· You may shower 24 hours after the procedure. Remove the bandage (dressing) and gently wash the site with plain soap and water. Gently pat the site dry.  °· Do not apply powder or lotion to the site.  °· Do not sit in a bathtub, swimming pool, or whirlpool for 5 to 7 days.  °· No bending, squatting, or lifting anything over 10 pounds (4.5 kg) as directed by your caregiver.  °· Inspect the site at least twice daily.  °· Do not drive home if you are discharged the same day of the procedure. Have someone else drive you.  °· You may drive 24 hours after the procedure unless otherwise instructed by your caregiver.  °What to expect: °· Any bruising will usually fade within 1 to 2 weeks.  °· Blood that collects in the tissue (hematoma) may be painful to the touch. It should usually decrease in size and tenderness within 1 to 2 weeks.  °SEEK IMMEDIATE MEDICAL CARE IF: °· You have unusual pain at the site or down the  affected limb.  °· You have redness, warmth, swelling, or pain at the site.  °· You have drainage (other than a small amount of blood on the dressing).  °· You have chills.  °· You have a fever or persistent symptoms for more than 72 hours.  °· You have a fever and your symptoms suddenly get worse.  °· Your leg becomes pale, cool, tingly, or numb.  °· You have heavy bleeding from the site. Hold pressure on the site.  °Document Released: 08/28/2010 Document Revised: 07/15/2011 Document Reviewed: 08/28/2010 °ExitCare® Patient Information ©2012 [employer]. ° °

## 2014-07-10 ENCOUNTER — Telehealth: Payer: Self-pay | Admitting: *Deleted

## 2014-07-10 MED ORDER — NITROGLYCERIN 0.4 MG SL SUBL: 0.4000 mg | SUBLINGUAL_TABLET | SUBLINGUAL | Status: DC | PRN

## 2014-07-10 NOTE — Telephone Encounter (Signed)
New Message  Pt had stint placed with Dr. Burt Knack last month and After experiencing similar symptoms today Pt wondered if an appt before 12/4 was necessary, please call and discuss

## 2014-07-10 NOTE — Telephone Encounter (Signed)
Spoke with patient and he is having stomach pain after eating yesterday like he had prior to recent stent.  Patient denies chest pain Exercised yesterday without any pain Discussed with  Dr. Mare Ferrari and ok to try NTG and keep appointment Friday Advised patient, verbalized understanding

## 2014-07-12 ENCOUNTER — Ambulatory Visit (INDEPENDENT_AMBULATORY_CARE_PROVIDER_SITE_OTHER): Payer: Medicare Other | Admitting: Cardiology

## 2014-07-12 ENCOUNTER — Encounter: Payer: Self-pay | Admitting: Cardiology

## 2014-07-12 VITALS — BP 122/68 | HR 84 | Ht 71.5 in | Wt 246.1 lb

## 2014-07-12 DIAGNOSIS — I10 Essential (primary) hypertension: Secondary | ICD-10-CM

## 2014-07-12 DIAGNOSIS — R079 Chest pain, unspecified: Secondary | ICD-10-CM

## 2014-07-12 DIAGNOSIS — Z01812 Encounter for preprocedural laboratory examination: Secondary | ICD-10-CM

## 2014-07-12 NOTE — Patient Instructions (Signed)
Your physician recommends that you continue on your current medications as directed. Please refer to the Current Medication list given to you today.   Your physician recommends that you schedule a follow-up appointment in:  WITH DR BRACKBILL IN 3 MONTHS     GOAL FOR LDL TO BE LESS THAN 70   GOAL FOR HGAIC TO BE LESS THAN 7   GOAL BLOOD PRESSURE RANGES TO BE  130 /80 'S

## 2014-07-12 NOTE — Progress Notes (Signed)
  07/12/2014 Seth Young   04/24/1941  [id number]  Primary Physician: ARONSON,RICHARD A, MD Primary Cardiologist: Dr. Brackbill  HPI:  The patient is a 73-year-old male, followed by Dr. Brackbill, with a prior history of insulin-dependent diabetes mellitus, hypertension and hyperlipidemia and newly diagnosed CAD, who presents to clinic for post hospital follow-up.   He was admitted to [facility] on 06/26/2014 to undergo selective left heart catheterization in the setting of recent chest pain and an abnormal nuclear stress test. He was found to have diffuse severe diabetic disease, with 50% proximal LAD stenosis, 25-30% mid LAD stenosis, and 50-60% diffuse apical LAD stenosis. The first diagonal branch is severely diseased in diffuse fashion with 80% ostial and 70% mid vessel stenosis. The left circumflex is diffusely diseased. It is a large caliber vessel. The proximal circumflex has 50% stenosis. The mid circumflex has scattered 30% stenoses. The first obtuse marginal was observed to be subtotally occluded with 99% stenosis. The second obtuse marginal is very small with 70% ostial stenosis. The left posterior lateral branches are large in caliber without significant disease. The right coronary artery is dominant. The vessel has very severe distal stenosis in a diabetic pattern with diffuse disease noted distally. The proximal vessel has diffuse irregularity. The entire mid vessel has 80% stenosis. The PDA branch has 95% stenosis and it is extremely small in caliber, visually less than 1 mm. Left ventriculography revealed normal LV function with EF of 55%. The culprit vessel was felt to be the OM1. This was successfully treated with PCI utilizing a drug-eluting stent. He tolerated the procedure well and was placed on dual antiplatelet therapy with aspirin plus Effient. He was also placed on a beta blocker and his Lipitor was increased to 40 mg daily.  Today in clinic, he reports that he has  been doing well. He denies any recurrent anginal symptoms. Since discharge, he has restarted routine exercise. He uses an exercise bike, on average 30-60 minutes at a time. He has had no exertional chest discomfort. He reports full medication compliance with dual antiplatelet therapy as well as medication compliance with his antihypertensives, diabetes medications as well as statin therapy. He denies any abnormal bleeding with dual antiplatelet therapy. No dyspnea, orthopnea, PND or lower extremity edema.   Current Outpatient Prescriptions  Medication Sig Dispense Refill  . Aflibercept (EYLEA IO) Inject into the eye every 5 (five) weeks.     . amLODipine (NORVASC) 5 MG tablet Take 5 mg by mouth daily before breakfast.    . aspirin 81 MG tablet Take 1 tablet (81 mg total) by mouth daily.    . atorvastatin (LIPITOR) 40 MG tablet Take 1 tablet (40 mg total) by mouth daily before breakfast. 30 tablet 11  . Canagliflozin (INVOKANA PO) Take 100 mg by mouth daily.     . celecoxib (CELEBREX) 400 MG capsule Take 400 mg by mouth daily.    . chlorthalidone (HYGROTON) 25 MG tablet Take 25 mg by mouth daily.     . colchicine 0.6 MG tablet Take 0.6 mg by mouth as needed (gout).    . desoximetasone (TOPICORT) 0.25 % cream Apply 1 application topically as needed (licanplantusa).    . dicyclomine (BENTYL) 10 MG capsule Take 10 mg by mouth 2 (two) times daily.    . insulin glargine (LANTUS) 100 UNIT/ML injection Inject 70 Units into the skin at bedtime.     . insulin lispro (HUMALOG) 100 UNIT/ML injection Inject 30 Units into the skin   as needed for high blood sugar (sliding scale). Sliding scale      . lisinopril (PRINIVIL,ZESTRIL) 40 MG tablet Take 40 mg by mouth daily before breakfast.    . metFORMIN (GLUCOPHAGE) 1000 MG tablet Take 1 tablet (1,000 mg total) by mouth 2 (two) times daily with a meal. HOLD for 48 hours, restart on 11/21.    . metoCLOPramide (REGLAN) 10 MG tablet Take 10 mg by mouth daily before  supper.    . metoprolol tartrate (LOPRESSOR) 25 MG tablet Take 1 tablet (25 mg total) by mouth 2 (two) times daily. 60 tablet 11  . nitroGLYCERIN (NITROSTAT) 0.4 MG SL tablet Place 1 tablet (0.4 mg total) under the tongue every 5 (five) minutes as needed for chest pain. 25 tablet 3  . omeprazole (PRILOSEC) 40 MG capsule Take 40 mg by mouth 2 (two) times daily.    . prasugrel (EFFIENT) 10 MG TABS tablet Take 1 tablet (10 mg total) by mouth daily. 30 tablet 11  . pregabalin (LYRICA) 50 MG capsule Take 50 mg by mouth every evening.     No current facility-administered medications for this visit.    Allergies  Allergen Reactions  . Morphine And Related Itching    Itching   . Naproxen Other (See Comments)    Tremors, hallucinates     History   Social History  . Marital Status: Married    Spouse Name: N/A    Number of Children: N/A  . Years of Education: N/A   Occupational History  . Not on file.   Social History Main Topics  . Smoking status: Former Smoker -- 0.50 packs/day for 20 years    Types: Cigarettes    Quit date: 10/11/1988  . Smokeless tobacco: Never Used  . Alcohol Use: Yes     Comment: Quit drinking in 1990  . Drug Use: No  . Sexual Activity: No   Other Topics Concern  . Not on file   Social History Narrative     Review of Systems: General: negative for chills, fever, night sweats or weight changes.  Cardiovascular: negative for chest pain, dyspnea on exertion, edema, orthopnea, palpitations, paroxysmal nocturnal dyspnea or shortness of breath Dermatological: negative for rash Respiratory: negative for cough or wheezing Urologic: negative for hematuria Abdominal: negative for nausea, vomiting, diarrhea, bright red blood per rectum, melena, or hematemesis Neurologic: negative for visual changes, syncope, or dizziness All other systems reviewed and are otherwise negative except as noted above.    Blood pressure 122/68, pulse 84, height 5' 11.5" (1.816 m),  weight 246 lb 1.9 oz (111.639 kg).  General appearance: alert, cooperative and no distress Neck: no carotid bruit and no JVD Lungs: clear to auscultation bilaterally Heart: regular rate and rhythm, S1, S2 normal, no murmur, click, rub or gallop Extremities: no LEE Pulses: 2+ and symmetric Skin: warm and dry Neurologic: Grossly normal  EKG sinus rhythm with first-degree AV block. Otherwise normal EKG without ischemic changes. Heart rate 84 bpm.  ASSESSMENT AND PLAN:   1. CAD: Pt with newly diagnosed CAD with diffuse severe diabetic disease. Status post PCI plus drug-eluting stenting to the OM1 vessel. He has had no recurrent anginal symptoms. His EKG is without any ischemic abnormalities. Continue dual antiplatelet therapy with aspirin plus Effient as well as Metroprolol and Lipitor. Given drug-eluting stent, he will need to continue dual antiplatelet therapy for a minimum of 12 months. It was noted in his cath report that he has very poor targets for grafting, but   he could be considered for surgical referral down the road if he develops proximal LAD stenosis (now at 50%). We will continue to follow him on a routine basis.  2. Post cardiac catheterization: Status post right radial cath. He denies any complications. He has a 2+ radial pulse on exam.   3. Hypertension: Well-controlled at 122/68. His goal in the setting of diabetes is less than 130/80. Continue current medical regimen.  4. Hyperlipidemia: Continue statin therapy with Lipitor. His PCP will follow this. Goal LDL <70.   5. Insulin-dependent diabetes mellitus: Followed and managed by PCP    PLAN  Pt appears to be doing well from a cardiac standpoint. As outlined in detail above, continue dual antiplatelet therapy for a minimum of 1 year as well as aggressive medical management for risk reduction. Patient instructed to follow-up with Dr. Brackbill in 3 months for reevaluation.  Oluwadamilare Tobler, BRITTAINYPA-C 07/12/2014 8:51 AM  

## 2014-07-18 ENCOUNTER — Encounter (HOSPITAL_COMMUNITY): Payer: Self-pay | Admitting: Cardiovascular Disease

## 2014-07-22 ENCOUNTER — Telehealth: Payer: Self-pay | Admitting: *Deleted

## 2014-07-22 DIAGNOSIS — R079 Chest pain, unspecified: Secondary | ICD-10-CM

## 2014-07-22 NOTE — Telephone Encounter (Signed)
Dr. Mare Ferrari spoke with Dr [name] today regarding patient Patient continues to have same chest discomfort as he did prior to having his stent Will arrange another outpatient myoview Left message to call back

## 2014-07-23 NOTE — Telephone Encounter (Signed)
Spoke with patient and he advised Sent to scheduling

## 2014-07-23 NOTE — Telephone Encounter (Signed)
F/U   Returning call for [facility].

## 2014-07-23 NOTE — Telephone Encounter (Signed)
Follow up ° ° ° ° °Returning Melinda's call °

## 2014-07-30 ENCOUNTER — Ambulatory Visit (HOSPITAL_COMMUNITY): Payer: Medicare Other | Attending: Cardiology | Admitting: Radiology

## 2014-07-30 ENCOUNTER — Encounter: Payer: Self-pay | Admitting: Cardiovascular Disease

## 2014-07-30 DIAGNOSIS — Z794 Long term (current) use of insulin: Secondary | ICD-10-CM | POA: Diagnosis not present

## 2014-07-30 DIAGNOSIS — I10 Essential (primary) hypertension: Secondary | ICD-10-CM | POA: Insufficient documentation

## 2014-07-30 DIAGNOSIS — R079 Chest pain, unspecified: Secondary | ICD-10-CM | POA: Insufficient documentation

## 2014-07-30 DIAGNOSIS — R0609 Other forms of dyspnea: Secondary | ICD-10-CM | POA: Insufficient documentation

## 2014-07-30 DIAGNOSIS — R42 Dizziness and giddiness: Secondary | ICD-10-CM | POA: Diagnosis not present

## 2014-07-30 DIAGNOSIS — E109 Type 1 diabetes mellitus without complications: Secondary | ICD-10-CM | POA: Insufficient documentation

## 2014-07-30 DIAGNOSIS — E119 Type 2 diabetes mellitus without complications: Secondary | ICD-10-CM

## 2014-07-30 DIAGNOSIS — R002 Palpitations: Secondary | ICD-10-CM | POA: Diagnosis not present

## 2014-07-30 DIAGNOSIS — E785 Hyperlipidemia, unspecified: Secondary | ICD-10-CM | POA: Diagnosis not present

## 2014-07-30 MED ORDER — TECHNETIUM TC 99M SESTAMIBI GENERIC - CARDIOLITE
30.0000 | Freq: Once | INTRAVENOUS | Status: AC | PRN
  Administered 2014-07-30: 30 via INTRAVENOUS

## 2014-07-30 MED ORDER — REGADENOSON 0.4 MG/5ML IV SOLN
0.4000 mg | Freq: Once | INTRAVENOUS | Status: AC
  Administered 2014-07-30: 0.4 mg via INTRAVENOUS

## 2014-07-30 MED ORDER — TECHNETIUM TC 99M SESTAMIBI GENERIC - CARDIOLITE
10.0000 | Freq: Once | INTRAVENOUS | Status: AC | PRN
  Administered 2014-07-30: 10 via INTRAVENOUS

## 2014-07-30 NOTE — Progress Notes (Signed)
[facility] [address] [place], [place] [address] [phone]    Cardiology Nuclear Med Study  Seth Young is a 73 y.o. male     MRN : [id number]     DOB: Apr 21, 1941  Procedure Date: 07/30/2014  Nuclear Med Background Indication for Stress Test:  Evaluation for Ischemia and Stent Patency History:  CAD-Stent, 06/25/14 MPI: Ischemia EF: 55% Cardiac Risk Factors: Hypertension, IDDM Type 1 and Lipids  Symptoms:  Chest Pain, Dizziness, DOE and Palpitations   Nuclear Pre-Procedure Caffeine/Decaff Intake:  None NPO After: 7:00pm   Lungs:  clear O2 Sat: 98% on room air. IV 0.9% NS with Angio Cath:  20g  IV Site: R Antecubital  IV Started by:  Perrin Maltese, EMT-P  Chest Size (in):  48 Cup Size: n/a  Height: 6' (1.829 m)  Weight:  239 lb (108.41 kg)  BMI:  Body mass index is 32.41 kg/(m^2). Tech Comments:  CBG 114 mg/dl No diabetic Rx this am    Nuclear Med Study 1 or 2 day study: 1 day  Stress Test Type:  Treadmill/Lexiscan  Reading MD: Jenkins Rouge, MD  Order Authorizing Provider:  T.Brackbill MD  Resting Radionuclide: Technetium 25m Sestamibi  Resting Radionuclide Dose: 11.0 mCi   Stress Radionuclide:  Technetium 35m Sestamibi  Stress Radionuclide Dose: 33.0 mCi           Stress Protocol Rest HR: 78 Stress HR: 107  Rest BP: 130/72 Stress BP: 170/72  Exercise Time (min): n/a METS: n/a   Predicted Max HR: 147 bpm % Max HR: 72.79 bpm Rate Pressure Product: 18297   Dose of Adenosine (mg):  n/a Dose of Lexiscan: 0.4 mg  Dose of Atropine (mg): n/a Dose of Dobutamine: n/a mcg/kg/min (at max HR)  Stress Test Technologist: Glade Lloyd, BS-ES  Nuclear Technologist:  Earl Many, CNMT     Rest Procedure:  Myocardial perfusion imaging was performed at rest 45 minutes following the intravenous administration of Technetium 1m Sestamibi. Rest ECG: NSR - Normal EKG  Stress Procedure:  The patient received IV Lexiscan 0.4 mg over  15-seconds with concurrent low level exercise and then Technetium 11m Sestamibi was injected at 30-seconds while the patient continued walking one more minute.  Quantitative spect images were obtained after a 45-minute delay.  During the infusion of Lexiscan the patient complained of SOB and chest pressure that resolved in recovery.  Stress ECG: No significant change from baseline ECG  QPS Raw Data Images:  Normal; no motion artifact; normal heart/lung ratio. Stress Images:  Abnormal Rest Images:  Abnormal Subtraction (SDS):  Multivessel disease Transient Ischemic Dilatation (Normal <1.22):  0.96 Lung/Heart Ratio (Normal <0.45):  0.35  Quantitative Gated Spect Images QGS EDV:  102 ml QGS ESV:  44 ml  Impression Exercise Capacity:  Lexiscan with low level exercise. BP Response:  Normal blood pressure response. Clinical Symptoms:  There is dyspnea. ECG Impression:  No significant ST segment change suggestive of ischemia. Comparison with Prior Nuclear Study: No images to compare  Overall Impression:  High risk stress nuclear study Moderate area of anterior and apical wall ischemiaas well as inferior wall ischemia from apex to base.  LV Ejection Fraction: 56%.  LV Wall Motion:  NL LV Function; NL Wall Motion  Jenkins Rouge

## 2014-07-31 NOTE — Telephone Encounter (Signed)
Spoke with pt, he is looking for the results of his stress test done yesterday. Made aware those results are not available yet. Patient voiced understanding

## 2014-07-31 NOTE — Telephone Encounter (Signed)
New message  ° ° °Patient calling for test results.   °

## 2014-08-01 ENCOUNTER — Telehealth: Payer: Self-pay | Admitting: Cardiology

## 2014-08-01 NOTE — Telephone Encounter (Signed)
New message  Pt wants to speak with Rn about Stress test results. Please call back and discuss.

## 2014-08-01 NOTE — Telephone Encounter (Signed)
Calling requesting results of stress test done earlier this week.  Advised that Dr. Mare Ferrari hasn't reviewed.  States he was leaving to go out of town today but will wait until he hears results.  Advised that would send message to Dr. Mare Ferrari for him to review on Monday and will call with results.

## 2014-08-05 ENCOUNTER — Other Ambulatory Visit: Payer: Self-pay | Admitting: Cardiology

## 2014-08-05 ENCOUNTER — Encounter: Payer: Self-pay | Admitting: *Deleted

## 2014-08-05 ENCOUNTER — Telehealth: Payer: Self-pay | Admitting: *Deleted

## 2014-08-05 ENCOUNTER — Other Ambulatory Visit: Payer: Self-pay | Admitting: *Deleted

## 2014-08-05 DIAGNOSIS — R079 Chest pain, unspecified: Secondary | ICD-10-CM

## 2014-08-05 DIAGNOSIS — Z01812 Encounter for preprocedural laboratory examination: Secondary | ICD-10-CM

## 2014-08-05 DIAGNOSIS — I209 Angina pectoris, unspecified: Secondary | ICD-10-CM

## 2014-08-05 LAB — CBC WITH DIFFERENTIAL/PLATELET
BASOS: 0 %
Basophils Absolute: 0 10*3/uL (ref 0.0–0.2)
Eos: 4 %
Eosinophils Absolute: 0.2 10*3/uL (ref 0.0–0.4)
HCT: 45.6 % (ref 37.5–51.0)
HEMOGLOBIN: 15.6 g/dL (ref 12.6–17.7)
LYMPHS ABS: 1.4 10*3/uL (ref 0.7–3.1)
Lymphs: 23 %
MCH: 29.3 pg (ref 26.6–33.0)
MCHC: 34.2 g/dL (ref 31.5–35.7)
MCV: 86 fL (ref 79–97)
MONOCYTES: 11 %
Monocytes Absolute: 0.6 10*3/uL (ref 0.1–0.9)
NEUTROS ABS: 3.8 10*3/uL (ref 1.4–7.0)
NEUTROS PCT: 62 %
RBC: 5.33 x10E6/uL (ref 4.14–5.80)
RDW: 15.4 % (ref 12.3–15.4)
WBC: 6 10*3/uL (ref 3.4–10.8)

## 2014-08-05 LAB — BASIC METABOLIC PANEL
BUN / CREAT RATIO: 19 (ref 10–22)
BUN: 28 mg/dL — AB (ref 8–27)
CO2: 25 mmol/L (ref 18–29)
Calcium: 10.3 mg/dL — ABNORMAL HIGH (ref 8.6–10.2)
Chloride: 98 mmol/L (ref 96–108)
Creatinine, Ser: 1.44 mg/dL — ABNORMAL HIGH (ref 0.76–1.27)
GFR calc Af Amer: 55 mL/min/{1.73_m2} — ABNORMAL LOW (ref >59)
GFR calc non Af Amer: 48 mL/min/{1.73_m2} — ABNORMAL LOW (ref >59)
GLUCOSE: 118 mg/dL — AB (ref 65–99)
Potassium: 5.2 mmol/L (ref 3.5–5.2)
Sodium: 136 mmol/L (ref 134–144)

## 2014-08-05 LAB — PROTIME-INR
INR: 1.1 (ref 0.8–1.2)
Prothrombin Time: 11.4 s (ref 9.1–12.0)

## 2014-08-05 LAB — APTT: aPTT: 29 s (ref 24–33)

## 2014-08-05 NOTE — Telephone Encounter (Signed)
Dr. Mare Ferrari reviewed labs for upcomming cardiac cath, recommendations as follows:  Hold Lisinopril tomorrow and Wednesday  Do not take Metformin in the morning as originally instructed (will hold after tonights dose until 48 hours after cath)  Ok to take Chlorthalidone tomorrow but none on Wednesday   Increase your water intake  Advised patient, verbalized understanding

## 2014-08-05 NOTE — Addendum Note (Signed)
Addended by: Alvina Filbert B on: 08/05/2014 10:36 AM   Modules accepted: Orders

## 2014-08-07 ENCOUNTER — Encounter (HOSPITAL_COMMUNITY)
Admission: RE | Disposition: A | Discharge status: Home / Self Care | Payer: Self-pay | Source: Ambulatory Visit | Attending: Cardiovascular Disease

## 2014-08-07 ENCOUNTER — Encounter (HOSPITAL_COMMUNITY): Payer: Self-pay | Admitting: Cardiovascular Disease

## 2014-08-07 ENCOUNTER — Ambulatory Visit (HOSPITAL_COMMUNITY)
Admission: RE | Admit: 2014-08-07 | Discharge: 2014-08-07 | Disposition: A | Discharge status: Home / Self Care | Payer: Medicare Other | Source: Ambulatory Visit | Attending: Cardiovascular Disease | Admitting: Cardiovascular Disease

## 2014-08-07 DIAGNOSIS — I10 Essential (primary) hypertension: Secondary | ICD-10-CM | POA: Insufficient documentation

## 2014-08-07 DIAGNOSIS — Z7982 Long term (current) use of aspirin: Secondary | ICD-10-CM | POA: Insufficient documentation

## 2014-08-07 DIAGNOSIS — Z87891 Personal history of nicotine dependence: Secondary | ICD-10-CM | POA: Diagnosis not present

## 2014-08-07 DIAGNOSIS — I209 Angina pectoris, unspecified: Secondary | ICD-10-CM

## 2014-08-07 DIAGNOSIS — I25118 Atherosclerotic heart disease of native coronary artery with other forms of angina pectoris: Secondary | ICD-10-CM

## 2014-08-07 DIAGNOSIS — Z79899 Other long term (current) drug therapy: Secondary | ICD-10-CM | POA: Diagnosis not present

## 2014-08-07 DIAGNOSIS — E785 Hyperlipidemia, unspecified: Secondary | ICD-10-CM | POA: Diagnosis not present

## 2014-08-07 DIAGNOSIS — E119 Type 2 diabetes mellitus without complications: Secondary | ICD-10-CM | POA: Insufficient documentation

## 2014-08-07 DIAGNOSIS — I25119 Atherosclerotic heart disease of native coronary artery with unspecified angina pectoris: Secondary | ICD-10-CM | POA: Insufficient documentation

## 2014-08-07 DIAGNOSIS — Z794 Long term (current) use of insulin: Secondary | ICD-10-CM | POA: Insufficient documentation

## 2014-08-07 HISTORY — DX: Angina pectoris, unspecified: I20.9

## 2014-08-07 HISTORY — PX: LEFT HEART CATHETERIZATION WITH CORONARY ANGIOGRAM: SHX5451

## 2014-08-07 LAB — BASIC METABOLIC PANEL
Anion gap: 10 (ref 5–15)
BUN: 26 mg/dL — ABNORMAL HIGH (ref 6–23)
CALCIUM: 9.4 mg/dL (ref 8.4–10.5)
CHLORIDE: 99 meq/L (ref 96–112)
CO2: 22 mmol/L (ref 19–32)
Creatinine, Ser: 1.47 mg/dL — ABNORMAL HIGH (ref 0.50–1.35)
GFR calc Af Amer: 53 mL/min — ABNORMAL LOW (ref >90)
GFR calc non Af Amer: 46 mL/min — ABNORMAL LOW (ref >90)
GLUCOSE: 181 mg/dL — AB (ref 70–99)
Potassium: 4.2 mmol/L (ref 3.5–5.1)
Sodium: 131 mmol/L — ABNORMAL LOW (ref 135–145)

## 2014-08-07 LAB — GLUCOSE, CAPILLARY
GLUCOSE-CAPILLARY: 196 mg/dL — AB (ref 70–99)
Glucose-Capillary: 93 mg/dL (ref 70–99)

## 2014-08-07 LAB — PLATELET COUNT: Platelets: 220 10*3/uL (ref 150–400)

## 2014-08-07 SURGERY — LEFT HEART CATHETERIZATION WITH CORONARY ANGIOGRAM
Anesthesia: LOCAL

## 2014-08-07 MED ORDER — SODIUM CHLORIDE 0.9 % IJ SOLN: 3.0000 mL | INTRAMUSCULAR | Status: DC | PRN

## 2014-08-07 MED ORDER — ONDANSETRON HCL 4 MG/2ML IJ SOLN: 4.0000 mg | Freq: Four times a day (QID) | INTRAMUSCULAR | Status: DC | PRN

## 2014-08-07 MED ORDER — MIDAZOLAM HCL 2 MG/2ML IJ SOLN
INTRAMUSCULAR | Status: AC
  Filled 2014-08-07: qty 2

## 2014-08-07 MED ORDER — ISOSORBIDE MONONITRATE ER 30 MG PO TB24: 15.0000 mg | ORAL_TABLET | Freq: Every day | ORAL | Status: DC

## 2014-08-07 MED ORDER — ACETAMINOPHEN 325 MG PO TABS: 650.0000 mg | ORAL_TABLET | ORAL | Status: DC | PRN

## 2014-08-07 MED ORDER — FENTANYL CITRATE 0.05 MG/ML IJ SOLN
INTRAMUSCULAR | Status: AC
  Filled 2014-08-07: qty 2

## 2014-08-07 MED ORDER — SODIUM CHLORIDE 0.9 % IJ SOLN: 3.0000 mL | Freq: Two times a day (BID) | INTRAMUSCULAR | Status: DC

## 2014-08-07 MED ORDER — SODIUM CHLORIDE 0.9 % IV SOLN: INTRAVENOUS | Status: DC

## 2014-08-07 MED ORDER — SODIUM CHLORIDE 0.9 % IV SOLN: 1.0000 mL/kg/h | INTRAVENOUS | Status: DC

## 2014-08-07 MED ORDER — ASPIRIN 81 MG PO CHEW: 81.0000 mg | CHEWABLE_TABLET | ORAL | Status: DC

## 2014-08-07 MED ORDER — HEPARIN (PORCINE) IN NACL 2-0.9 UNIT/ML-% IJ SOLN
INTRAMUSCULAR | Status: AC
  Filled 2014-08-07: qty 1500

## 2014-08-07 MED ORDER — HEPARIN SODIUM (PORCINE) 1000 UNIT/ML IJ SOLN
INTRAMUSCULAR | Status: AC
  Filled 2014-08-07: qty 1

## 2014-08-07 MED ORDER — LIDOCAINE HCL (PF) 1 % IJ SOLN
INTRAMUSCULAR | Status: AC
  Filled 2014-08-07: qty 30

## 2014-08-07 MED ORDER — SODIUM CHLORIDE 0.9 % IV SOLN: 250.0000 mL | INTRAVENOUS | Status: DC | PRN

## 2014-08-07 MED ORDER — NITROGLYCERIN 1 MG/10 ML FOR IR/CATH LAB
INTRA_ARTERIAL | Status: AC
  Filled 2014-08-07: qty 10

## 2014-08-07 NOTE — CV Procedure (Signed)
    Cardiac Catheterization Procedure Note  Name: Seth Young MRN: [id number] DOB: 03-Nov-1940  Procedure: Catheter placement for coronary angiography, Selective Coronary Angiography  Indication: CCS Class 2 angina, abnormal (high-risk) Myoview scan with anteroapical and inferior ischemia.   Procedural Details: The right wrist was prepped, draped, and anesthetized with 1% lidocaine. Using the modified Seldinger technique, a 5/6 French Slender sheath was introduced into the right radial artery. 3 mg of verapamil was administered through the sheath, weight-based unfractionated heparin was administered intravenously. Standard Judkins catheters were used for selective coronary angiography. Intracoronary NTG was administered. Catheter exchanges were performed over an exchange length guidewire. There were no immediate procedural complications. A TR band was used for radial hemostasis at the completion of the procedure.  The patient was transferred to the post catheterization recovery area for further monitoring.  Procedural Findings: Hemodynamics: AO 111/61  Coronary angiography: Coronary dominance: right  Left mainstem: The left main is patent with no obstruction. It divides into the LAD and left circumflex.  Left anterior descending (LAD): The LAD has diffuse atherosclerosis. There is diffuse 20-30% proximal stenosis. At the junction of the proximal to mid vessel, there is 40-50% stenosis at most. The mid and distal vessel are patent with diffuse irregularity noted. The LAD wraps around the LV apex. The first of a perforator is widely patent. The first diagonal has 30-40% ostial stenosis and 60-70% mid vessel stenosis.  Left circumflex (LCx): The left circumflex is patent. There is a large first obtuse marginal branch with a widely patent stent. There is no significant in-stent restenosis noted. This vessel divides into 20 branches. The AV circumflex has diffuse nonobstructive stenosis. There  are scattered 40-50% stenoses at the ostium and proximal vessel. The mid vessel is dilated and somewhat ectatic. The posterolateral branches are widely patent without significant stenosis.  Right coronary artery (RCA): The RCA is a dominant vessel. The proximal vessel has 50% stenosis the mid vessel is diffusely tapered with 70-80% stenosis. The distal vessel and the PDA has subtotal occlusion with 95% tandem stenoses leading into a tiny PDA branch with to and fro flow. The acute marginal branch is patent with mild to moderate diffuse stenosis of about 50-60%.  Left ventriculography: Defered. LV function normal by nuclear scan.  Estimated Blood Loss: minimal  Final Conclusions:   1. Widely patent OM stent 2. Diffuse nonobstructive LAD and Left Circumflex stenoses 3. Severe RCA stenosis involving the distal vessel and PDA branches  Recommendations: The patient's CAD is stable. His recently implanted stent is widely patent. His LAD stenosis is actually improved from the previous study (likely because of IC NTG). I do not think his right PDA is graftable or treatable with PCI and would favor ongoing medical therapy. Will add low-dose isosorbide and arrange follow-up with Dr Mare Ferrari.  Sherren Mocha MD, [facility] 08/07/2014, 1:56 PM

## 2014-08-07 NOTE — Discharge Instructions (Signed)
Transradial Angiography Radial Site Care Refer to this sheet in the next few weeks. These instructions provide you with information on caring for yourself after your procedure. Your caregiver may also give you more specific instructions. Your treatment has been planned according to current medical practices, but problems sometimes occur. Call your caregiver if you have any problems or questions after your procedure. HOME CARE INSTRUCTIONS  You may shower the day after the procedure.Remove the bandage (dressing) and gently wash the site with plain soap and water.Gently pat the site dry.  Do not apply powder or lotion to the site.  Do not submerge the affected site in water for 3 to 5 days.  Inspect the site at least twice daily.  Do not flex or bend the affected arm for 24 hours.  No lifting over 5 pounds (2.3 kg) for 5 days after your procedure.  Do not drive home if you are discharged the same day of the procedure. Have someone else drive you.  You may drive 24 hours after the procedure unless otherwise instructed by your caregiver.  Do not operate machinery or power tools for 24 hours.  A responsible adult should be with you for the first 24 hours after you arrive home. What to expect:  Any bruising will usually fade within 1 to 2 weeks.  Blood that collects in the tissue (hematoma) may be painful to the touch. It should usually decrease in size and tenderness within 1 to 2 weeks. SEEK IMMEDIATE MEDICAL CARE IF:  You have unusual pain at the radial site.  You have redness, warmth, swelling, or pain at the radial site.  You have drainage (other than a small amount of blood on the dressing).  You have chills.  You have a fever or persistent symptoms for more than 72 hours.  You have a fever and your symptoms suddenly get worse.  Your arm becomes pale, cool, tingly, or numb.  You have heavy bleeding from the site. Hold pressure on the site. Call 911 Document Released:  08/28/2010 Document Revised: 10/18/2011 Document Reviewed: 08/28/2010 [facility] Patient Information 2015 [place], [place]. This information is not intended to replace advice given to you by your health care provider. Make sure you discuss any questions you have with your health care provider.

## 2014-08-07 NOTE — H&P (View-Only) (Signed)
07/12/2014 Seth Young   [birth year]  [id number]  Primary Physician: Geoffery Lyons, MD Primary Cardiologist: Dr. Mare Ferrari  HPI:  The patient is a 73 year old male, followed by Dr. Mare Ferrari, with a prior history of insulin-dependent diabetes mellitus, hypertension and hyperlipidemia and newly diagnosed CAD, who presents to clinic for post hospital follow-up.   He was admitted to [facility] on 06/26/2014 to undergo selective left heart catheterization in the setting of recent chest pain and an abnormal nuclear stress test. He was found to have diffuse severe diabetic disease, with 50% proximal LAD stenosis, 25-30% mid LAD stenosis, and 50-60% diffuse apical LAD stenosis. The first diagonal branch is severely diseased in diffuse fashion with 80% ostial and 70% mid vessel stenosis. The left circumflex is diffusely diseased. It is a large caliber vessel. The proximal circumflex has 50% stenosis. The mid circumflex has scattered 30% stenoses. The first obtuse marginal was observed to be subtotally occluded with 99% stenosis. The second obtuse marginal is very small with 70% ostial stenosis. The left posterior lateral branches are large in caliber without significant disease. The right coronary artery is dominant. The vessel has very severe distal stenosis in a diabetic pattern with diffuse disease noted distally. The proximal vessel has diffuse irregularity. The entire mid vessel has 80% stenosis. The PDA branch has 95% stenosis and it is extremely small in caliber, visually less than 1 mm. Left ventriculography revealed normal LV function with EF of 55%. The culprit vessel was felt to be the OM1. This was successfully treated with PCI utilizing a drug-eluting stent. He tolerated the procedure well and was placed on dual antiplatelet therapy with aspirin plus Effient. He was also placed on a beta blocker and his Lipitor was increased to 40 mg daily.  Today in clinic, he reports that he has  been doing well. He denies any recurrent anginal symptoms. Since discharge, he has restarted routine exercise. He uses an exercise bike, on average 30-60 minutes at a time. He has had no exertional chest discomfort. He reports full medication compliance with dual antiplatelet therapy as well as medication compliance with his antihypertensives, diabetes medications as well as statin therapy. He denies any abnormal bleeding with dual antiplatelet therapy. No dyspnea, orthopnea, PND or lower extremity edema.   Current Outpatient Prescriptions  Medication Sig Dispense Refill  . Aflibercept (EYLEA IO) Inject into the eye every 5 (five) weeks.     Marland Kitchen amLODipine (NORVASC) 5 MG tablet Take 5 mg by mouth daily before breakfast.    . aspirin 81 MG tablet Take 1 tablet (81 mg total) by mouth daily.    Marland Kitchen atorvastatin (LIPITOR) 40 MG tablet Take 1 tablet (40 mg total) by mouth daily before breakfast. 30 tablet 11  . Canagliflozin (INVOKANA PO) Take 100 mg by mouth daily.     . celecoxib (CELEBREX) 400 MG capsule Take 400 mg by mouth daily.    . chlorthalidone (HYGROTON) 25 MG tablet Take 25 mg by mouth daily.     . colchicine 0.6 MG tablet Take 0.6 mg by mouth as needed (gout).    Marland Kitchen desoximetasone (TOPICORT) 0.25 % cream Apply 1 application topically as needed (licanplantusa).    . dicyclomine (BENTYL) 10 MG capsule Take 10 mg by mouth 2 (two) times daily.    . insulin glargine (LANTUS) 100 UNIT/ML injection Inject 70 Units into the skin at bedtime.     . insulin lispro (HUMALOG) 100 UNIT/ML injection Inject 30 Units into the skin  as needed for high blood sugar (sliding scale). Sliding scale      . lisinopril (PRINIVIL,ZESTRIL) 40 MG tablet Take 40 mg by mouth daily before breakfast.    . metFORMIN (GLUCOPHAGE) 1000 MG tablet Take 1 tablet (1,000 mg total) by mouth 2 (two) times daily with a meal. HOLD for 48 hours, restart on 11/21.    Marland Kitchen metoCLOPramide (REGLAN) 10 MG tablet Take 10 mg by mouth daily before  supper.    . metoprolol tartrate (LOPRESSOR) 25 MG tablet Take 1 tablet (25 mg total) by mouth 2 (two) times daily. 60 tablet 11  . nitroGLYCERIN (NITROSTAT) 0.4 MG SL tablet Place 1 tablet (0.4 mg total) under the tongue every 5 (five) minutes as needed for chest pain. 25 tablet 3  . omeprazole (PRILOSEC) 40 MG capsule Take 40 mg by mouth 2 (two) times daily.    . prasugrel (EFFIENT) 10 MG TABS tablet Take 1 tablet (10 mg total) by mouth daily. 30 tablet 11  . pregabalin (LYRICA) 50 MG capsule Take 50 mg by mouth every evening.     No current facility-administered medications for this visit.    Allergies  Allergen Reactions  . Morphine And Related Itching    Itching   . Naproxen Other (See Comments)    Tremors, hallucinates     History   Social History  . Marital Status: Married    Spouse Name: N/A    Number of Children: N/A  . Years of Education: N/A   Occupational History  . Not on file.   Social History Main Topics  . Smoking status: Former Smoker -- 0.50 packs/day for 20 years    Types: Cigarettes    Quit date: 10/11/1988  . Smokeless tobacco: Never Used  . Alcohol Use: Yes     Comment: Quit drinking in 1990  . Drug Use: No  . Sexual Activity: No   Other Topics Concern  . Not on file   Social History Narrative     Review of Systems: General: negative for chills, fever, night sweats or weight changes.  Cardiovascular: negative for chest pain, dyspnea on exertion, edema, orthopnea, palpitations, paroxysmal nocturnal dyspnea or shortness of breath Dermatological: negative for rash Respiratory: negative for cough or wheezing Urologic: negative for hematuria Abdominal: negative for nausea, vomiting, diarrhea, bright red blood per rectum, melena, or hematemesis Neurologic: negative for visual changes, syncope, or dizziness All other systems reviewed and are otherwise negative except as noted above.    Blood pressure 122/68, pulse 84, height 5' 11.5" (1.816 m),  weight 246 lb 1.9 oz (111.639 kg).  General appearance: alert, cooperative and no distress Neck: no carotid bruit and no JVD Lungs: clear to auscultation bilaterally Heart: regular rate and rhythm, S1, S2 normal, no murmur, click, rub or gallop Extremities: no LEE Pulses: 2+ and symmetric Skin: warm and dry Neurologic: Grossly normal  EKG sinus rhythm with first-degree AV block. Otherwise normal EKG without ischemic changes. Heart rate 84 bpm.  ASSESSMENT AND PLAN:   1. CAD: Pt with newly diagnosed CAD with diffuse severe diabetic disease. Status post PCI plus drug-eluting stenting to the OM1 vessel. He has had no recurrent anginal symptoms. His EKG is without any ischemic abnormalities. Continue dual antiplatelet therapy with aspirin plus Effient as well as Metroprolol and Lipitor. Given drug-eluting stent, he will need to continue dual antiplatelet therapy for a minimum of 12 months. It was noted in his cath report that he has very poor targets for grafting, but  he could be considered for surgical referral down the road if he develops proximal LAD stenosis (now at 50%). We will continue to follow him on a routine basis.  2. Post cardiac catheterization: Status post right radial cath. He denies any complications. He has a 2+ radial pulse on exam.   3. Hypertension: Well-controlled at 122/68. His goal in the setting of diabetes is less than 130/80. Continue current medical regimen.  4. Hyperlipidemia: Continue statin therapy with Lipitor. His PCP will follow this. Goal LDL <70.   5. Insulin-dependent diabetes mellitus: Followed and managed by PCP    PLAN  Pt appears to be doing well from a cardiac standpoint. As outlined in detail above, continue dual antiplatelet therapy for a minimum of 1 year as well as aggressive medical management for risk reduction. Patient instructed to follow-up with Dr. Mare Ferrari in 3 months for reevaluation.  Ruffus Kamaka, BRITTAINYPA-C 07/12/2014 8:51 AM

## 2014-08-07 NOTE — Interval H&P Note (Signed)
History and Physical Interval Note:  08/07/2014 1:15 PM  Seth Young  has presented today for surgery, with the diagnosis of angina  The various methods of treatment have been discussed with the patient and family. After consideration of risks, benefits and other options for treatment, the patient has consented to  Procedure(s): LEFT HEART CATHETERIZATION WITH CORONARY ANGIOGRAM (N/A) as a surgical intervention .  The patient's history has been reviewed, patient examined, no change in status, stable for surgery.  I have reviewed the patient's chart and labs.  Questions were answered to the patient's satisfaction.    Cath Lab Visit (complete for each Cath Lab visit)  Clinical Evaluation Leading to the Procedure:   ACS: No.  Non-ACS:    Anginal Classification: CCS II  Anti-ischemic medical therapy: Minimal Therapy (1 class of medications)  Non-Invasive Test Results: High-risk stress test findings: cardiac mortality >3%/year  Prior CABG: No previous CABG       Sherren Mocha

## 2014-08-12 ENCOUNTER — Telehealth: Payer: Self-pay | Admitting: Cardiology

## 2014-08-12 NOTE — Telephone Encounter (Signed)
Agree with plan 

## 2014-08-12 NOTE — Telephone Encounter (Signed)
New message    Pt C/O of Chest Pain: STAT if CP now or developed within 24 hours  1. Are you having CP right now? When sitting straight up  .discomfort .  . around right side of heart.  At the office now  2. Are you experiencing any other symptoms (ex. SOB, nausea, vomiting, sweating)? no  3. How long have you been experiencing CP? Discomfort starting today   4. Is your CP continuous or coming and going? Started today   5. Have you taken Nitroglycerin? No, new medication isosorbide 30 mg  ?

## 2014-08-12 NOTE — Telephone Encounter (Signed)
Patient reports cath done  last week showed that his stent was working fine. Had some slight right sided chest discomfort when standing up today. Likens it to a "gas bubble". Not the same pain as before the cath. He is going to try an antacid to see if that helps. Taking Isosorbide, so doesn't think he needs Nitro.

## 2014-08-12 NOTE — Telephone Encounter (Signed)
Left message to call back  

## 2014-10-08 ENCOUNTER — Ambulatory Visit (INDEPENDENT_AMBULATORY_CARE_PROVIDER_SITE_OTHER): Payer: Medicare Other | Admitting: Cardiology

## 2014-10-08 ENCOUNTER — Encounter: Payer: Self-pay | Admitting: Cardiology

## 2014-10-08 VITALS — BP 114/56 | HR 73 | Ht 71.0 in | Wt 236.0 lb

## 2014-10-08 DIAGNOSIS — I209 Angina pectoris, unspecified: Secondary | ICD-10-CM

## 2014-10-08 DIAGNOSIS — I10 Essential (primary) hypertension: Secondary | ICD-10-CM

## 2014-10-08 DIAGNOSIS — E0842 Diabetes mellitus due to underlying condition with diabetic polyneuropathy: Secondary | ICD-10-CM

## 2014-10-08 MED ORDER — LISINOPRIL 10 MG PO TABS: 10.0000 mg | ORAL_TABLET | Freq: Every day | ORAL | Status: DC

## 2014-10-08 NOTE — Progress Notes (Signed)
Cardiology Office Note   Date:  10/08/2014   ID:  Seth Young, DOB 15-May-1941, MRN [id number]  PCP:  Geoffery Lyons, MD  Cardiologist:   Darlin Coco, MD   No chief complaint on file.     History of Present Illness: Seth Young is a 74 y.o. male who presents for 3 month follow-up office visit  HPI: The patient is a 74 year old male with a prior history of insulin-dependent diabetes mellitus, hypertension and hyperlipidemia and newly diagnosed CAD, who is seen for a scheduled follow-up office visit.  He was admitted to [facility] on 06/26/2014 to undergo selective left heart catheterization in the setting of recent chest pain and an abnormal nuclear stress test. He was found to have diffuse severe diabetic disease, with 50% proximal LAD stenosis, 25-30% mid LAD stenosis, and 50-60% diffuse apical LAD stenosis. The first diagonal branch is severely diseased in diffuse fashion with 80% ostial and 70% mid vessel stenosis. The left circumflex is diffusely diseased. It is a large caliber vessel. The proximal circumflex has 50% stenosis. The mid circumflex has scattered 30% stenoses. The first obtuse marginal was observed to be subtotally occluded with 99% stenosis. The second obtuse marginal is very small with 70% ostial stenosis. The left posterior lateral branches are large in caliber without significant disease. The right coronary artery is dominant. The vessel has very severe distal stenosis in a diabetic pattern with diffuse disease noted distally. The proximal vessel has diffuse irregularity. The entire mid vessel has 80% stenosis. The PDA branch has 95% stenosis and it is extremely small in caliber, visually less than 1 mm. Left ventriculography revealed Seth LV function with EF of 55%. The culprit vessel was felt to be the OM1. This was successfully treated with PCI utilizing a drug-eluting stent. He tolerated the procedure well and was placed on dual antiplatelet  therapy with aspirin plus Effient. He was also placed on a beta blocker and his Lipitor was increased to 40 mg daily. Since last visit he has been doing well.  He has lost 10 pounds intentionally.  He has not been having any angina pectoris.  Since he has lost weight and his blood pressure has been running low Seth and at times he feels slightly lightheaded and dizzy.  He has not had syncope.  He remains on dual antiplatelet therapy.  He has not had any evidence of GI bleeding clinically.    Past Medical History  Diagnosis Date  . Hypertension   . Colon polyps 10-12-11    past hx.  . Exertional angina 06/26/2014    Cath with DES to the OM, Bioflow protocol   . High cholesterol   . Pneumonia 1940's X 2  . Sleep apnea     no cpap used  . Type II diabetes mellitus dx'd 1982    nsulin started ~ 2000  . H/O hiatal hernia     no problems  . GERD (gastroesophageal reflux disease)   . Arthritis     "knees, toes, hands" (06/26/2014)  . Pseudogout     "it moves around"  . Basal cell carcinoma     left temporal area-no residual  . Ischemic chest pain 08/07/2014    Past Surgical History  Procedure Laterality Date  . Back surgery    . Nasal septum surgery  1979  . Carpal tunnel release Left 10/2009  . Knee arthroscopy Left 05/2011    10'12-left knee  . Shoulder open rotator cuff repair  10/14/2011    Procedure: ROTATOR CUFF REPAIR SHOULDER OPEN;  Surgeon: Johnn Hai, MD;  Location: [facility];  Service: Orthopedics;  Laterality: Right;  . Subacromial decompression  10/14/2011    Procedure: SUBACROMIAL DECOMPRESSION;  Surgeon: Johnn Hai, MD;  Location: [facility];  Service: Orthopedics;  Laterality: Right;  . Cardiac catheterization  02/2005    Dr. Mare Ferrari; negative.  . Coronary angioplasty with stent placement  06/26/2014    "1"  . Posterior lumbar fusion  1964  . Carpal tunnel release Right ~ 2013  . Cataract extraction w/ intraocular lens implant Left 2000's  . Cataract extraction  w/ intraocular lens implant Right 2015  . Elbow surgery Bilateral ~ 2014    "for blockages; Dr. Amedeo Plenty"  . Trigger finger release Right ~ 2013-2014    "3rd & 4th digits"  . Basal cell carcinoma excision Left ~ 2012    face  . Coronary angioplasty with stent placement  06/26/2014    pLAD 50%, d LAD 60%, oD1 80%,  mD1  70%, CFX 50%, OM 2 70%,  RCA 80%, PDA 95% (<82mm), OM1 99%-0% with Bio flow stent       . Left heart catheterization with coronary angiogram N/A 06/26/2014    Procedure: LEFT HEART CATHETERIZATION WITH CORONARY ANGIOGRAM;  Surgeon: Blane Ohara, MD;  Location: [facility] CATH LAB;  Service: Cardiovascular;  Laterality: N/A;  . Left heart catheterization with coronary angiogram N/A 08/07/2014    Procedure: LEFT HEART CATHETERIZATION WITH CORONARY ANGIOGRAM;  Surgeon: Blane Ohara, MD;  Location: [facility] CATH LAB;  Service: Cardiovascular;  Laterality: N/A;     Current Outpatient Prescriptions  Medication Sig Dispense Refill  . acetaminophen (TYLENOL) 325 MG tablet Take 650 mg by mouth at bedtime as needed (sleep).    . Aflibercept (EYLEA IO) Inject 1 each into the eye every 6 (six) weeks.     Marland Kitchen aspirin 81 MG tablet Take 1 tablet (81 mg total) by mouth daily.    Marland Kitchen atorvastatin (LIPITOR) 40 MG tablet Take 1 tablet (40 mg total) by mouth daily before breakfast. 30 tablet 11  . canagliflozin (INVOKANA) 100 MG TABS tablet Take 100 mg by mouth daily.    . colchicine 0.6 MG tablet Take 0.6 mg by mouth as needed (gout).    Marland Kitchen desoximetasone (TOPICORT) 0.25 % cream Apply 1 application topically as needed (licanplantusa).    . diphenhydrAMINE (BENADRYL) 25 MG tablet Take 25 mg by mouth at bedtime as needed for sleep.    . dorzolamide-timolol (COSOPT) 22.3-6.8 MG/ML ophthalmic solution Place 1 drop into both eyes 2 (two) times daily.    . insulin glargine (LANTUS) 100 UNIT/ML injection Inject 60 Units into the skin at bedtime.     . insulin lispro (HUMALOG) 100 UNIT/ML injection Inject 0-30  Units into the skin daily as needed for high blood sugar (sliding scale). Sliding scale      . isosorbide mononitrate (IMDUR) 30 MG 24 hr tablet Take 0.5 tablets (15 mg total) by mouth daily. 15 tablet 11  . lisinopril (PRINIVIL,ZESTRIL) 10 MG tablet Take 1 tablet (10 mg total) by mouth daily before breakfast. 90 tablet 1  . metFORMIN (GLUCOPHAGE) 1000 MG tablet Take 1 tablet (1,000 mg total) by mouth 2 (two) times daily with a meal. HOLD for 48 hours, restart on 11/21.    Marland Kitchen metoCLOPramide (REGLAN) 10 MG tablet Take 10 mg by mouth daily before supper.    . metoprolol tartrate (LOPRESSOR) 25 MG tablet Take  1 tablet (25 mg total) by mouth 2 (two) times daily. 60 tablet 11  . Multiple Vitamins-Minerals (ZINC PO) Take 1 tablet by mouth daily.    . nitroGLYCERIN (NITROSTAT) 0.4 MG SL tablet Place 1 tablet (0.4 mg total) under the tongue every 5 (five) minutes as needed for chest pain. 25 tablet 3  . Omega-3 Fatty Acids (FISH OIL PO) Take 1 capsule by mouth daily.    Marland Kitchen omeprazole (PRILOSEC) 40 MG capsule Take 40 mg by mouth 2 (two) times daily.    . prasugrel (EFFIENT) 10 MG TABS tablet Take 1 tablet (10 mg total) by mouth daily. 30 tablet 11  . pregabalin (LYRICA) 50 MG capsule Take 50 mg by mouth every evening.    . Probiotic Product (PROBIOTIC PO) Take 1 tablet by mouth daily.     No current facility-administered medications for this visit.    Allergies:   Morphine and related and Naproxen    Social History:  The patient  reports that he quit smoking about 26 years ago. His smoking use included Cigarettes. He has a 10 pack-year smoking history. He has never used smokeless tobacco. He reports that he drinks alcohol. He reports that he does not use illicit drugs.   Family History:  The patient's family history is not on file.    ROS:  Please see the history of present illness.   Otherwise, review of systems are positive for none.   All other systems are reviewed and negative.    PHYSICAL  EXAM: VS:  BP 114/56 mmHg  Pulse 73  Ht 5\' 11"  (1.803 m)  Wt 236 lb (107.049 kg)  BMI 32.93 kg/m2 , BMI Body mass index is 32.93 kg/(m^2). GEN: Well nourished, well developed, in no acute distress HEENT: Seth Neck: no JVD, carotid bruits, or masses Cardiac: RRR; no murmurs, rubs, or gallops,no edema  Respiratory:  clear to auscultation bilaterally, Seth work of breathing GI: soft, nontender, nondistended, + BS MS: no deformity or atrophy Skin: warm and dry, no rash Neuro:  Strength and sensation are intact Psych: euthymic mood, full affect   EKG:  EKG is not ordered today.    Recent Labs: 08/05/2014: Hemoglobin 15.6 08/07/2014: BUN 26*; Creatinine 1.47*; Platelets 220; Potassium 4.2; Sodium 131*    Lipid Panel No results found for: CHOL, TRIG, HDL, CHOLHDL, VLDL, LDLCALC, LDLDIRECT    Wt Readings from Last 3 Encounters:  10/08/14 236 lb (107.049 kg)  08/07/14 239 lb (108.41 kg)  07/30/14 239 lb (108.41 kg)        ASSESSMENT AND PLAN:  1. CAD: Pt with newly diagnosed CAD with diffuse severe diabetic disease. Status post PCI plus drug-eluting stenting to the OM1 vessel. He has had no recurrent anginal symptoms. Continue dual antiplatelet therapy with aspirin plus Effient as well as Metroprolol and Lipitor. Given drug-eluting stent, he will need to continue dual antiplatelet therapy for a minimum of 12 months. It was noted in his cath report that he has very poor targets for grafting, but he could be considered for surgical referral down the road if he develops proximal LAD stenosis (now at 50%). We will continue to follow him on a routine basis.    2. Hypertension: Well-controlled at 122/68.  Blood pressure is now running low 114/56 and he is feeling mildly lightheaded and dizzy.  He is presently on lisinopril 20 mg daily.  We will reduce his dose to 10 mg daily.  Continue current dose of beta blocker 4. Hyperlipidemia: Continue  statin therapy with Lipitor. His PCP  will follow this. Goal LDL <70.   5. Insulin-dependent diabetes mellitus: Followed and managed by PCP       Current medicines are reviewed at length with the patient today.  The patient has concerns regarding medicines.  He feels that he is on too much blood pressure medicine.  The following changes have been made:  Lisinopril was reduced to 10 mg daily   No orders of the defined types were placed in this encounter.     Disposition:   FU with Dr. Mare Ferrari in 3 months for office visit and EKG   Signed, Darlin Coco, MD  10/08/2014 1:08 PM    [facility] [place], [place],   [address] Phone: [phone]; Fax: [phone]

## 2014-10-08 NOTE — Patient Instructions (Signed)
DECREASE YOUR LISINOPRIL TO 10 MG DAILY  Your physician recommends that you schedule a follow-up appointment in: [place]

## 2014-11-05 ENCOUNTER — Telehealth: Payer: Self-pay | Admitting: Cardiology

## 2014-11-05 NOTE — Telephone Encounter (Signed)
Will forward to  Dr. Brackbill for review 

## 2014-11-05 NOTE — Telephone Encounter (Signed)
New message   1. What dental office are you calling from? [facility] [place] [facility]   2. What is your office phone and fax number? [phone] / fax [phone]  3. What type of procedure is the patient having performed? exrataction tooth / implament    4. What date is procedure scheduled?  4.5.2016 @ 9:45 am   5. What is your question (ex. Antibiotics prior to procedure, holding medication-we need to know how long dentist wants pt to hold med)? effient  & baby asa.

## 2014-11-06 NOTE — Telephone Encounter (Signed)
Faxed and confirmed received.

## 2014-11-06 NOTE — Telephone Encounter (Signed)
He does not need SBE prophylaxis. Keep taking ASA Ok to stop Effient briefly if dentist needs it stopped. Stop up to 5 days ahead, or less days if dentist allows. He had a DES in November 2015.

## 2015-01-08 ENCOUNTER — Encounter: Payer: Self-pay | Admitting: Cardiology

## 2015-01-08 ENCOUNTER — Ambulatory Visit (INDEPENDENT_AMBULATORY_CARE_PROVIDER_SITE_OTHER): Payer: Medicare Other | Admitting: Cardiology

## 2015-01-08 VITALS — BP 118/60 | HR 71 | Ht 71.5 in | Wt 234.2 lb

## 2015-01-08 DIAGNOSIS — I10 Essential (primary) hypertension: Secondary | ICD-10-CM | POA: Diagnosis not present

## 2015-01-08 DIAGNOSIS — E0842 Diabetes mellitus due to underlying condition with diabetic polyneuropathy: Secondary | ICD-10-CM | POA: Diagnosis not present

## 2015-01-08 DIAGNOSIS — R079 Chest pain, unspecified: Secondary | ICD-10-CM | POA: Diagnosis not present

## 2015-01-08 NOTE — Progress Notes (Signed)
Cardiology Office Note   Date:  01/08/2015   ID:  Seth Young, DOB May 10, 1941, MRN [id number]  PCP:  Geoffery Lyons, MD  Cardiologist: Darlin Coco MD  No chief complaint on file.     History of Present Illness: Seth Young is a 74 y.o. male who presents for cardiology follow-up HPI: The patient is a 74 year old male with a prior history of insulin-dependent diabetes mellitus, hypertension and hyperlipidemia and  CAD, who is seen for a scheduled follow-up office visit.  He was admitted to [facility] on 06/26/2014 to undergo selective left heart catheterization in the setting of recent chest pain and an abnormal nuclear stress test. He was found to have diffuse severe diabetic disease, with 50% proximal LAD stenosis, 25-30% mid LAD stenosis, and 50-60% diffuse apical LAD stenosis. The first diagonal branch is severely diseased in diffuse fashion with 80% ostial and 70% mid vessel stenosis. The left circumflex is diffusely diseased. It is a large caliber vessel. The proximal circumflex has 50% stenosis. The mid circumflex has scattered 30% stenoses. The first obtuse marginal was observed to be subtotally occluded with 99% stenosis. The second obtuse marginal is very small with 70% ostial stenosis. The left posterior lateral branches are large in caliber without significant disease. The right coronary artery is dominant. The vessel has very severe distal stenosis in a diabetic pattern with diffuse disease noted distally. The proximal vessel has diffuse irregularity. The entire mid vessel has 80% stenosis. The PDA branch has 95% stenosis and it is extremely small in caliber, visually less than 1 mm. Left ventriculography revealed normal LV function with EF of 55%. The culprit vessel was felt to be the OM1. This was successfully treated with PCI utilizing a drug-eluting stent. He tolerated the procedure well and was placed on dual antiplatelet therapy with aspirin plus Effient.  He was also placed on a beta blocker and his Lipitor was increased to 40 mg daily. Since last visit he has been doing well.He has had rare episodes of chest discomfort which have not required sublingual nitroglycerin.  He thinks that the discomfort may be related to gas from GI issues.  He remains on dual antiplatelet therapy. He has not had any evidence of GI bleeding clinically. He has a history of peripheral neuropathy which is quite painful at times.  He takes a combination of Lyrica, Benadryl, and Tylenol for pain relief.  Past Medical History  Diagnosis Date  . Hypertension   . Colon polyps 10-12-11    past hx.  . Exertional angina 06/26/2014    Cath with DES to the OM, Bioflow protocol   . High cholesterol   . Pneumonia 1940's X 2  . Sleep apnea     no cpap used  . Type II diabetes mellitus dx'd 1982    nsulin started ~ 2000  . H/O hiatal hernia     no problems  . GERD (gastroesophageal reflux disease)   . Arthritis     "knees, toes, hands" (06/26/2014)  . Pseudogout     "it moves around"  . Basal cell carcinoma     left temporal area-no residual  . Ischemic chest pain 08/07/2014    Past Surgical History  Procedure Laterality Date  . Back surgery    . Nasal septum surgery  1979  . Carpal tunnel release Left 10/2009  . Knee arthroscopy Left 05/2011    10'12-left knee  . Shoulder open rotator cuff repair  10/14/2011  Procedure: ROTATOR CUFF REPAIR SHOULDER OPEN;  Surgeon: Johnn Hai, MD;  Location: [facility];  Service: Orthopedics;  Laterality: Right;  . Subacromial decompression  10/14/2011    Procedure: SUBACROMIAL DECOMPRESSION;  Surgeon: Johnn Hai, MD;  Location: [facility];  Service: Orthopedics;  Laterality: Right;  . Cardiac catheterization  02/2005    Dr. Mare Ferrari; negative.  . Coronary angioplasty with stent placement  06/26/2014    "1"  . Posterior lumbar fusion  1964  . Carpal tunnel release Right ~ 2013  . Cataract extraction w/ intraocular lens implant  Left 2000's  . Cataract extraction w/ intraocular lens implant Right 2015  . Elbow surgery Bilateral ~ 2014    "for blockages; Dr. Amedeo Plenty"  . Trigger finger release Right ~ 2013-2014    "3rd & 4th digits"  . Basal cell carcinoma excision Left ~ 2012    face  . Coronary angioplasty with stent placement  06/26/2014    pLAD 50%, d LAD 60%, oD1 80%,  mD1  70%, CFX 50%, OM 2 70%,  RCA 80%, PDA 95% (<67mm), OM1 99%-0% with Bio flow stent       . Left heart catheterization with coronary angiogram N/A 06/26/2014    Procedure: LEFT HEART CATHETERIZATION WITH CORONARY ANGIOGRAM;  Surgeon: Blane Ohara, MD;  Location: [facility] CATH LAB;  Service: Cardiovascular;  Laterality: N/A;  . Left heart catheterization with coronary angiogram N/A 08/07/2014    Procedure: LEFT HEART CATHETERIZATION WITH CORONARY ANGIOGRAM;  Surgeon: Blane Ohara, MD;  Location: [facility] CATH LAB;  Service: Cardiovascular;  Laterality: N/A;     Current Outpatient Prescriptions  Medication Sig Dispense Refill  . acetaminophen (TYLENOL) 325 MG tablet Take 650 mg by mouth at bedtime as needed (sleep).    . Aflibercept (EYLEA IO) Inject 1 each into the eye every 6 (six) weeks. LEFT eye    . aspirin 81 MG tablet Take 1 tablet (81 mg total) by mouth daily.    Marland Kitchen atorvastatin (LIPITOR) 40 MG tablet Take 1 tablet (40 mg total) by mouth daily before breakfast. 30 tablet 11  . canagliflozin (INVOKANA) 100 MG TABS tablet Take 100 mg by mouth daily.    . colchicine 0.6 MG tablet Take 0.6 mg by mouth as needed (gout).    Marland Kitchen desoximetasone (TOPICORT) 0.25 % cream Apply 1 application topically as needed (licanplantusa).    . diphenhydrAMINE (BENADRYL) 25 MG tablet Take 25 mg by mouth at bedtime as needed for sleep.    . dorzolamide-timolol (COSOPT) 22.3-6.8 MG/ML ophthalmic solution Place 1 drop into both eyes 2 (two) times daily.    . insulin glargine (LANTUS) 100 UNIT/ML injection Inject 50 Units into the skin at bedtime.     . insulin lispro  (HUMALOG) 100 UNIT/ML injection Inject 0-30 Units into the skin daily as needed for high blood sugar (sliding scale). Sliding scale      . isosorbide mononitrate (IMDUR) 30 MG 24 hr tablet Take 0.5 tablets (15 mg total) by mouth daily. 15 tablet 11  . lisinopril (PRINIVIL,ZESTRIL) 10 MG tablet Take 1 tablet (10 mg total) by mouth daily before breakfast. 90 tablet 1  . metFORMIN (GLUCOPHAGE) 1000 MG tablet Take 1 tablet (1,000 mg total) by mouth 2 (two) times daily with a meal. HOLD for 48 hours, restart on 11/21.    Marland Kitchen metoCLOPramide (REGLAN) 10 MG tablet Take 10 mg by mouth daily before supper.    . metoprolol tartrate (LOPRESSOR) 25 MG tablet Take 1 tablet (25  mg total) by mouth 2 (two) times daily. 60 tablet 11  . Multiple Vitamins-Minerals (ZINC PO) Take 1 tablet by mouth daily.    . nitroGLYCERIN (NITROSTAT) 0.4 MG SL tablet Place 1 tablet (0.4 mg total) under the tongue every 5 (five) minutes as needed for chest pain. 25 tablet 3  . Omega-3 Fatty Acids (FISH OIL PO) Take 1 capsule by mouth daily.    Marland Kitchen omeprazole (PRILOSEC) 40 MG capsule Take 40 mg by mouth 2 (two) times daily.    . prasugrel (EFFIENT) 10 MG TABS tablet Take 1 tablet (10 mg total) by mouth daily. 30 tablet 11  . pregabalin (LYRICA) 50 MG capsule Take 50 mg by mouth every evening.    . Probiotic Product (PROBIOTIC PO) Take 1 tablet by mouth daily.     No current facility-administered medications for this visit.    Allergies:   Gabapentin; Morphine and related; and Naproxen    Social History:  The patient  reports that he quit smoking about 26 years ago. His smoking use included Cigarettes. He has a 10 pack-year smoking history. He has never used smokeless tobacco. He reports that he drinks alcohol. He reports that he does not use illicit drugs.   Family History:  The patient's family history includes Diabetes in his mother; Heart attack (age of onset: 98) in his father.    ROS:  Please see the history of present  illness.   Otherwise, review of systems are positive for none.   All other systems are reviewed and negative.    PHYSICAL EXAM: VS:  BP 118/60 mmHg  Pulse 71  Ht 5' 11.5" (1.816 m)  Wt 234 lb 3.2 oz (106.232 kg)  BMI 32.21 kg/m2 , BMI Body mass index is 32.21 kg/(m^2). GEN: Well nourished, well developed, in no acute distress HEENT: normal Neck: no JVD, carotid bruits, or masses Cardiac: RRR; no murmurs, rubs, or gallops,no edema  Respiratory:  clear to auscultation bilaterally, normal work of breathing GI: soft, nontender, nondistended, + BS MS: no deformity or atrophy Skin: warm and dry, no rash Neuro:  Strength and sensation are intact Psych: euthymic mood, full affect   EKG:  EKG is not ordered today.    Recent Labs: 08/05/2014: Hemoglobin 15.6 08/07/2014: BUN 26*; Creatinine 1.47*; Platelets 220; Potassium 4.2; Sodium 131*    Lipid Panel No results found for: CHOL, TRIG, HDL, CHOLHDL, VLDL, LDLCALC, LDLDIRECT    Wt Readings from Last 3 Encounters:  01/08/15 234 lb 3.2 oz (106.232 kg)  10/08/14 236 lb (107.049 kg)  08/07/14 239 lb (108.41 kg)        ASSESSMENT AND PLAN:   1. CAD: Pt with newly diagnosed CAD with diffuse severe diabetic disease. Status post PCI plus drug-eluting stenting to the OM1 vessel. He has had no recurrent anginal symptoms. Continue dual antiplatelet therapy with aspirin plus Effient as well as Metroprolol and Lipitor. Given drug-eluting stent, he will need to continue dual antiplatelet therapy for a minimum of 12 months. It was noted in his cath report that he has very poor targets for grafting, but he could be considered for surgical referral down the road if he develops proximal LAD stenosis (now at 50%). We will continue to follow him on a routine basis.    2. Hypertension: Well-controlled .  Continue isosorbide mononitrate and lisinopril 4. Hyperlipidemia: Continue statin therapy with Lipitor. His PCP will follow this. Goal LDL <70.   He is on atorvastatin  5. Insulin-dependent diabetes mellitus: Followed  and managed by PCP   Current medicines are reviewed at length with the patient today.  The patient does not have concerns regarding medicines.  The following changes have been made:  no change  Labs/ tests ordered today include:  No orders of the defined types were placed in this encounter.     Disposition:   Continue current cardiac medications.  Recheck in 4 months for office visit and EKG  Signed, Darlin Coco MD 01/08/2015 1:19 PM    [address], [place], [place]  [address] Phone: [phone]; Fax: [phone]

## 2015-01-08 NOTE — Patient Instructions (Signed)
Medication Instructions:  Your physician recommends that you continue on your current medications as directed. Please refer to the Current Medication list given to you today.  Labwork: NONE  Testing/Procedures: NONE  Follow-Up: Your physician wants you to follow-up in: [place] will receive a reminder letter in the mail two months in advance. If you don't receive a letter, please call our office to schedule the follow-up appointment.

## 2015-03-16 ENCOUNTER — Other Ambulatory Visit: Payer: Self-pay | Admitting: Physician Assistant

## 2015-03-19 LAB — HM DIABETES EYE EXAM

## 2015-03-31 ENCOUNTER — Other Ambulatory Visit: Payer: Self-pay | Admitting: *Deleted

## 2015-03-31 DIAGNOSIS — I6522 Occlusion and stenosis of left carotid artery: Secondary | ICD-10-CM

## 2015-04-08 ENCOUNTER — Encounter: Payer: Self-pay | Admitting: Vascular Surgery

## 2015-04-08 ENCOUNTER — Other Ambulatory Visit: Payer: Self-pay | Admitting: Cardiology

## 2015-04-09 ENCOUNTER — Encounter: Payer: Self-pay | Admitting: Vascular Surgery

## 2015-04-09 ENCOUNTER — Other Ambulatory Visit: Payer: Self-pay

## 2015-04-09 ENCOUNTER — Ambulatory Visit (HOSPITAL_COMMUNITY)
Admission: RE | Admit: 2015-04-09 | Discharge: 2015-04-09 | Disposition: A | Discharge status: Home / Self Care | Payer: Medicare Other | Source: Ambulatory Visit | Attending: Vascular Surgery | Admitting: Vascular Surgery

## 2015-04-09 ENCOUNTER — Ambulatory Visit (INDEPENDENT_AMBULATORY_CARE_PROVIDER_SITE_OTHER): Payer: Medicare Other | Admitting: Vascular Surgery

## 2015-04-09 VITALS — BP 125/73 | HR 74 | Temp 97.1°F | Ht 71.5 in | Wt 233.0 lb

## 2015-04-09 DIAGNOSIS — I6523 Occlusion and stenosis of bilateral carotid arteries: Secondary | ICD-10-CM | POA: Diagnosis not present

## 2015-04-09 DIAGNOSIS — E119 Type 2 diabetes mellitus without complications: Secondary | ICD-10-CM | POA: Diagnosis not present

## 2015-04-09 DIAGNOSIS — I779 Disorder of arteries and arterioles, unspecified: Secondary | ICD-10-CM | POA: Insufficient documentation

## 2015-04-09 DIAGNOSIS — I10 Essential (primary) hypertension: Secondary | ICD-10-CM | POA: Insufficient documentation

## 2015-04-09 DIAGNOSIS — I739 Peripheral vascular disease, unspecified: Secondary | ICD-10-CM

## 2015-04-09 DIAGNOSIS — I6522 Occlusion and stenosis of left carotid artery: Secondary | ICD-10-CM | POA: Insufficient documentation

## 2015-04-09 NOTE — Progress Notes (Signed)
New Carotid Patient  Referred by:  Burnard Bunting, MD [address] [place], [place] [address]  Reason for referral: abnormal left carotid stenosis  History of Present Illness  Seth Young is a 74 y.o. (December 02, 1940) male who presents with chief complaint: left visual disturbances.  Patient notes he was previously getting "injections in his left eye."  Recently he started developing central visual field loss, which his Opth. felt might be due to carotid disease.  He was evaluated by his PCP who ordered a B carotid duplex.  Previous carotid studies demonstrated: RICA <84% stenosis, LICA >53% stenosis.  The patient has never had amaurosis fugax or monocular blindness rather he had the central vision disturbance.  The patient has never had facial drooping or hemiplegia.  The patient has never had receptive or expressive aphasia.   The patient's previous neurologic deficits have mostly resolved.  The patient's risks factors for carotid disease include: HTN, IDDM, and prior smoking  Past Medical History  Diagnosis Date  . Hypertension   . Colon polyps 10-12-11    past hx.  . Exertional angina 06/26/2014    Cath with DES to the OM, Bioflow protocol   . High cholesterol   . Pneumonia 1940's X 2  . Sleep apnea     no cpap used  . Type II diabetes mellitus dx'd 1982    nsulin started ~ 2000  . H/O hiatal hernia     no problems  . GERD (gastroesophageal reflux disease)   . Arthritis     "knees, toes, hands" (06/26/2014)  . Pseudogout     "it moves around"  . Basal cell carcinoma     left temporal area-no residual  . Ischemic chest pain 08/07/2014    Past Surgical History  Procedure Laterality Date  . Back surgery    . Nasal septum surgery  1979  . Carpal tunnel release Left 10/2009  . Knee arthroscopy Left 05/2011    10'12-left knee  . Shoulder open rotator cuff repair  10/14/2011    Procedure: ROTATOR CUFF REPAIR SHOULDER OPEN;  Surgeon: Johnn Hai, MD;  Location: [facility];   Service: Orthopedics;  Laterality: Right;  . Subacromial decompression  10/14/2011    Procedure: SUBACROMIAL DECOMPRESSION;  Surgeon: Johnn Hai, MD;  Location: [facility];  Service: Orthopedics;  Laterality: Right;  . Cardiac catheterization  02/2005    Dr. Mare Ferrari; negative.  . Coronary angioplasty with stent placement  06/26/2014    "1"  . Posterior lumbar fusion  1964  . Carpal tunnel release Right ~ 2013  . Cataract extraction w/ intraocular lens implant Left 2000's  . Cataract extraction w/ intraocular lens implant Right 2015  . Elbow surgery Bilateral ~ 2014    "for blockages; Dr. Amedeo Plenty"  . Trigger finger release Right ~ 2013-2014    "3rd & 4th digits"  . Basal cell carcinoma excision Left ~ 2012    face  . Coronary angioplasty with stent placement  06/26/2014    pLAD 50%, d LAD 60%, oD1 80%,  mD1  70%, CFX 50%, OM 2 70%,  RCA 80%, PDA 95% (<69mm), OM1 99%-0% with Bio flow stent       . Left heart catheterization with coronary angiogram N/A 06/26/2014    Procedure: LEFT HEART CATHETERIZATION WITH CORONARY ANGIOGRAM;  Surgeon: Blane Ohara, MD;  Location: [facility] CATH LAB;  Service: Cardiovascular;  Laterality: N/A;  . Left heart catheterization with coronary angiogram N/A 08/07/2014  Procedure: LEFT HEART CATHETERIZATION WITH CORONARY ANGIOGRAM;  Surgeon: Blane Ohara, MD;  Location: [facility] CATH LAB;  Service: Cardiovascular;  Laterality: N/A;    Social History   Social History  . Marital Status: Married    Spouse Name: N/A  . Number of Children: N/A  . Years of Education: N/A   Occupational History  . Not on file.   Social History Main Topics  . Smoking status: Former Smoker -- 0.50 packs/day for 20 years    Types: Cigarettes    Quit date: 10/11/1988  . Smokeless tobacco: Never Used  . Alcohol Use: 0.0 oz/week    0 Standard drinks or equivalent per week     Comment: Quit drinking in 1990  . Drug Use: No  . Sexual Activity: No   Other Topics Concern  . Not on  file   Social History Narrative    Family History  Problem Relation Age of Onset  . Diabetes Mother   . Varicose Veins Mother   . Heart attack Father 56    "massive heart attack"  . Cancer Sister   . Diabetes Sister   . Hypertension Sister   . Varicose Veins Sister   . Diabetes Brother   . Hypertension Brother     Current Outpatient Prescriptions on File Prior to Visit  Medication Sig Dispense Refill  . acetaminophen (TYLENOL) 325 MG tablet Take 650 mg by mouth at bedtime as needed (sleep).    . Aflibercept (EYLEA IO) Inject 1 each into the eye every 6 (six) weeks. LEFT eye    . aspirin 81 MG tablet Take 1 tablet (81 mg total) by mouth daily.    Marland Kitchen atorvastatin (LIPITOR) 40 MG tablet Take 1 tablet (40 mg total) by mouth daily before breakfast. 30 tablet 11  . canagliflozin (INVOKANA) 100 MG TABS tablet Take 100 mg by mouth daily.    Marland Kitchen desoximetasone (TOPICORT) 0.25 % cream Apply 1 application topically as needed (licanplantusa).    . diphenhydrAMINE (BENADRYL) 25 MG tablet Take 25 mg by mouth at bedtime as needed for sleep.    . dorzolamide-timolol (COSOPT) 22.3-6.8 MG/ML ophthalmic solution Place 1 drop into both eyes 2 (two) times daily.    . insulin glargine (LANTUS) 100 UNIT/ML injection Inject 50 Units into the skin at bedtime.     . insulin lispro (HUMALOG) 100 UNIT/ML injection Inject 0-30 Units into the skin daily as needed for high blood sugar (sliding scale). Sliding scale      . isosorbide mononitrate (IMDUR) 30 MG 24 hr tablet Take 0.5 tablets (15 mg total) by mouth daily. 15 tablet 11  . lisinopril (PRINIVIL,ZESTRIL) 10 MG tablet TAKE ONE TABLET BY MOUTH ONCE DAILY BEFORE  BREAKFAST. 90 tablet 0  . metFORMIN (GLUCOPHAGE) 1000 MG tablet TAKE ONE TABLET BY MOUTH TWICE DAILY WITH A MEAL 60 tablet 0  . metoprolol tartrate (LOPRESSOR) 25 MG tablet Take 1 tablet (25 mg total) by mouth 2 (two) times daily. 60 tablet 11  . nitroGLYCERIN (NITROSTAT) 0.4 MG SL tablet Place 1  tablet (0.4 mg total) under the tongue every 5 (five) minutes as needed for chest pain. 25 tablet 3  . Omega-3 Fatty Acids (FISH OIL PO) Take 1 capsule by mouth daily.    Marland Kitchen omeprazole (PRILOSEC) 40 MG capsule Take 40 mg by mouth 2 (two) times daily.    . prasugrel (EFFIENT) 10 MG TABS tablet Take 1 tablet (10 mg total) by mouth daily. 30 tablet 11  . pregabalin (  LYRICA) 50 MG capsule Take 50 mg by mouth every evening.    . Probiotic Product (PROBIOTIC PO) Take 1 tablet by mouth daily.    . colchicine 0.6 MG tablet Take 0.6 mg by mouth as needed (gout).    Marland Kitchen metoCLOPramide (REGLAN) 10 MG tablet Take 10 mg by mouth daily before supper.    . Multiple Vitamins-Minerals (ZINC PO) Take 1 tablet by mouth daily.     No current facility-administered medications on file prior to visit.    Allergies  Allergen Reactions  . Gabapentin     Ataxia  . Morphine And Related Itching    Itching   . Naproxen Other (See Comments)    Tremors, hallucinates     REVIEW OF SYSTEMS:  (Positives checked otherwise negative)  CARDIOVASCULAR:  []  chest pain, []  chest pressure, []  palpitations, []  shortness of breath when laying flat, []  shortness of breath with exertion,  []  pain in feet when walking, []  pain in feet when laying flat, []  history of blood clot in veins (DVT), []  history of phlebitis, []  swelling in legs, []  varicose veins  PULMONARY:  []  productive cough, []  asthma, []  wheezing  NEUROLOGIC:  []  weakness in arms or legs, []  numbness in arms or legs, []  difficulty speaking or slurred speech, []  temporary loss of vision in one eye, []  dizziness  HEMATOLOGIC:  []  bleeding problems, []  problems with blood clotting too easily  MUSCULOSKEL:  []  joint pain, []  joint swelling  GASTROINTEST:  []  vomiting blood, []  blood in stool     GENITOURINARY:  []  burning with urination, []  blood in urine  PSYCHIATRIC:  []  history of major depression  INTEGUMENTARY:  []  rashes, []  ulcers  CONSTITUTIONAL:  []   fever, []  chills  For VQI Use Only  PRE-ADM LIVING: Home  AMB STATUS: Ambulatory  CAD Sx: History of MI, but no symptoms No MI within 6 months  PRIOR CHF: None  STRESS TEST: [x]  No, [ ]  Normal, [ ]  + ischemia, [ ]  + MI, [ ]  Both   Physical Examination  Filed Vitals:   04/09/15 1321 04/09/15 1324  BP: 122/69 125/73  Pulse: 74 74  Temp: 97.1 F (36.2 C)   Height: 5' 11.5" (1.816 m)   Weight: 233 lb (105.688 kg)   SpO2: 98%    Body mass index is 32.05 kg/(m^2).  General: A&O x 3, WDWN  Head: [place]/AT  Ear/Nose/Throat: Hearing grossly intact, nares w/o erythema or drainage, oropharynx w/o Erythema/Exudate, Mallampati score: 3  Eyes: PERRLA, EOMI  Neck: Supple, no nuchal rigidity, no palpable LAD  Pulmonary: Sym exp, good air movt, CTAB, no rales, rhonchi, & wheezing  Cardiac: RRR, Nl S1, S2, no Murmurs, rubs or gallops  Vascular: Vessel Right Left  Radial Palpable Palpable  Brachial Palpable Palpable  Carotid Palpable, without bruit Palpable, without bruit  Aorta  Not palpable N/A  Femoral Palpable Palpable  Popliteal Not palpable Not palpable  PT Palpable Palpable  DP Palpable Palpable   Gastrointestinal: soft, NTND, -G/R, - HSM, - masses, - CVAT B  Musculoskeletal: M/S 5/5 throughout , Extremities without ischemic changes , BLE CVI skin changes with varicosities  Neurologic: CN 2-12 intact , Pain and light touch intact in extremities , Motor exam as listed above  Psychiatric: Judgment intact, Mood & affect appropriate for pt's clinical situation  Dermatologic: See M/S exam for extremity exam, no rashes otherwise noted  Lymph : No Cervical, Axillary, or Inguinal lymphadenopathy    Non-Invasive Vascular Imaging  L CAROTID DUPLEX (Date: 04/09/2015):   R ICA stenosis: 80-99% (near occluded)  R VA: patent and antegrade   Outside Studies/Documentation 4 pages of outside documents were reviewed including: outpatient PCP chart, outside carotid  duplex.   Medical Decision Making  UNKNOWN SCHLEYER is a 74 y.o. male who presents with: sx L ICA stenosis >90%., asx R ICA stenosis <50%   Based on the patient's vascular studies and examination, I have offered the patient: L CEA. I discussed with the patient the risks, benefits, and alternatives to carotid endarterectomy.   The patient is may be a candidate for carotid artery stenting.  I discussed the procedural details of carotid endarterectomy with the patient.   The patient is aware that the risks of carotid endarterectomy include but are not limited to: bleeding, infection, stroke, myocardial infarction, death, cranial nerve injuries both temporary and permanent, neck hematoma, possible airway compromise, labile blood pressure post-operatively, cerebral hyperperfusion syndrome, and possible need for additional interventions in the future.  The patient is aware of the risks and agrees to proceed forward with the procedure.  Patient has significant cardiac history, but recently was seen by Dr. Mare Ferrari.  Will see if he clears the patient to proceed with L CEA.  I discussed in depth with the patient the nature of atherosclerosis, and emphasized the importance of maximal medical management including strict control of blood pressure, blood glucose, and lipid levels, obtaining regular exercise, antiplatelet agents, and cessation of smoking.   The patient is currently on a statin: Lipitor.                                                 The patient is currently on an anti-platelet: ASA..    The patient is aware that without maximal medical management the underlying atherosclerotic disease process will progress, limiting the benefit of any interventions.  Thank you for allowing [place] to participate in this patient's care.   Adele Barthel, MD [facility] Office: [phone] Pager: [phone]  04/09/2015, 5:19 PM

## 2015-04-10 ENCOUNTER — Telehealth: Payer: Self-pay | Admitting: Cardiology

## 2015-04-10 ENCOUNTER — Encounter: Payer: Self-pay | Admitting: Internal Medicine

## 2015-04-10 NOTE — Telephone Encounter (Signed)
Will forward to  Dr. Brackbill for review 

## 2015-04-10 NOTE — Telephone Encounter (Signed)
The patient is cleared for surgery.  We can use his last office visit for his surgical clearance.

## 2015-04-10 NOTE — Telephone Encounter (Signed)
Request for surgical clearance:  1. What type of surgery is being performed? Left CEA  2. When is this surgery scheduled? SEPT 13, 2016  3. Are there any medications that need to be held prior to surgery and how long?   NO  4. Name of physician performing surgery? Dr. Rondel Jumbo  5. What is your office phone and fax number? [place] wants to know if the last office visit is okay to use for Surgical Clearance?

## 2015-04-11 ENCOUNTER — Telehealth: Payer: Self-pay | Admitting: Cardiology

## 2015-04-11 NOTE — Telephone Encounter (Signed)
Will fax to Dr Lianne Moris office

## 2015-04-11 NOTE — Telephone Encounter (Signed)
He had a drug-eluting stent placed 06/26/14 so he is not yet at the one year mark, but he is close. For elective surgery it is preferable to wait a year before stopping Effient. For urgent surgery it is permissable to hold the Effient for one week while continuing the baby aspirin but he needs to understand that there is a risk of stent thrombosis while he is off the Effient.  The risk would be higher in the first 6 months following the stent.

## 2015-04-11 NOTE — Telephone Encounter (Signed)
Will forward to  Dr. Brackbill for review 

## 2015-04-11 NOTE — Telephone Encounter (Signed)
New message    Office got surgical clearance but they need to know if pt can stop effient 7 days prior to surgery Please call to discuss

## 2015-04-11 NOTE — Telephone Encounter (Signed)
Spoke with Colletta Maryland and she will discuss with Dr Bridgett Larsson and call back with plan

## 2015-04-15 ENCOUNTER — Other Ambulatory Visit: Payer: Self-pay

## 2015-04-15 DIAGNOSIS — Z955 Presence of coronary angioplasty implant and graft: Secondary | ICD-10-CM

## 2015-04-15 MED ORDER — CLOPIDOGREL BISULFATE 75 MG PO TABS: 75.0000 mg | ORAL_TABLET | Freq: Every day | ORAL | Status: DC

## 2015-04-15 NOTE — Telephone Encounter (Signed)
Okay 

## 2015-04-15 NOTE — Telephone Encounter (Signed)
Spoke with Colletta Maryland and Dr Lianne Moris instructions for patient are as follows:  Last dose of Effient today  Start Plavix tomorrow   Procedure date 04/23/15  Colletta Maryland spoke to patient today regarding medication changes  Did read instructions back to [place] to ensure correct

## 2015-04-17 NOTE — Pre-Procedure Instructions (Signed)
Seth Young  04/17/2015     Your procedure is scheduled on : Wednesday April 23, 2015 at 8:30 AM.  Report to [facility] Admitting at 6:30 A.M.  Call this number if you have problems the morning of surgery: [phone]    Remember:  Do not eat food or drink liquids after midnight.  Take these medicines the morning of surgery with A SIP OF WATER : Isosorbide (Imdur), Lyrica, Metoprolol (Lopressor), Omeprazole (Prilosec)   Please follow your physicians instructions regarding Plavix   Do NOT take any diabetic pills the morning of your surgery (NO Invokana, Metformin/Glucophage)   Stop taking any vitamins, herbal medications, Fish oil, Advil, Motrin, Aleve, etc   How to Manage Your Diabetes Before Surgery   Why is it important to control my blood sugar before and after surgery?   Improving blood sugar levels before and after surgery helps healing and can limit problems.  A way of improving blood sugar control is eating a healthy diet by:  - Eating less sugar and carbohydrates  - Increasing activity/exercise  - Talk with your doctor about reaching your blood sugar goals  High blood sugars (greater than 180 mg/dL) can raise your risk of infections and slow down your recovery so you will need to focus on controlling your diabetes during the weeks before surgery.  Make sure that the doctor who takes care of your diabetes knows about your planned surgery including the date and location.  How do I manage my blood sugars before surgery?   Check your blood sugar at least 4 times a day, 2 days before surgery to make sure that they are not too high or low.   Check your blood sugar the morning of your surgery when you wake up and every 2 hours until you get to the Short-Stay unit.  If your blood sugar is less than 70 mg/dL, you will need to treat for low blood sugar by:  Treat a low blood sugar (less than 70 mg/dL) with 1/2 cup of clear juice (cranberry or apple), 4  glucose tablets, OR glucose gel.  Recheck blood sugar in 15 minutes after treatment (to make sure it is greater than 70 mg/dL).  If blood sugar is not greater than 70 mg/dL on re-check, call [phone] for further instructions.   Report your blood sugar to the Short-Stay nurse when you get to Short-Stay.  References:  [facility], 2007 "How to Manage your Diabetes Before and After Surgery".  What do I do about my diabetes medications?   Do not take oral diabetes medicines (pills) the morning of surgery.   THE NIGHT BEFORE SURGERY, take 44 units of Lantus Insulin.    THE MORNING OF SURGERY, If your CBG is greater than 220 mg/dL, you may take 1/2 (half) of your sliding scale (correction) Humulog insulin. (0-15 Units)    Do not wear jewelry.  Do not wear lotions, powders, or cologne.    Men may shave face and neck.  Do not bring valuables to the hospital.  [facility] is not responsible for any belongings or valuables.  Contacts, dentures or bridgework may not be worn into surgery.  Leave your suitcase in the car.  After surgery it may be brought to your room.  For patients admitted to the hospital, discharge time will be determined by your treatment team.  Patients discharged the day of surgery will not be allowed to drive home.   Name and phone number  of your driver:    Special instructions:  Shower using CHG soap the night before and the morning of your surgery  Please read over the following fact sheets that you were given. Pain Booklet, Coughing and Deep Breathing, MRSA Information and Surgical Site Infection Prevention

## 2015-04-18 ENCOUNTER — Encounter (HOSPITAL_COMMUNITY)
Admission: RE | Admit: 2015-04-18 | Discharge: 2015-04-18 | Disposition: A | Discharge status: Home / Self Care | Payer: Medicare Other | Source: Ambulatory Visit | Attending: Vascular Surgery | Admitting: Vascular Surgery

## 2015-04-18 ENCOUNTER — Encounter (HOSPITAL_COMMUNITY): Payer: Self-pay

## 2015-04-18 DIAGNOSIS — Z0183 Encounter for blood typing: Secondary | ICD-10-CM | POA: Insufficient documentation

## 2015-04-18 DIAGNOSIS — Z01812 Encounter for preprocedural laboratory examination: Secondary | ICD-10-CM | POA: Diagnosis not present

## 2015-04-18 DIAGNOSIS — I6522 Occlusion and stenosis of left carotid artery: Secondary | ICD-10-CM | POA: Diagnosis not present

## 2015-04-18 HISTORY — DX: Tremor, unspecified: R25.1

## 2015-04-18 HISTORY — DX: Polyneuropathy, unspecified: G62.9

## 2015-04-18 LAB — COMPREHENSIVE METABOLIC PANEL
ALK PHOS: 69 U/L (ref 38–126)
ALT: 27 U/L (ref 17–63)
ANION GAP: 9 (ref 5–15)
AST: 32 U/L (ref 15–41)
Albumin: 4.2 g/dL (ref 3.5–5.0)
BUN: 11 mg/dL (ref 6–20)
CALCIUM: 9.4 mg/dL (ref 8.9–10.3)
CO2: 24 mmol/L (ref 22–32)
Chloride: 104 mmol/L (ref 101–111)
Creatinine, Ser: 1.17 mg/dL (ref 0.61–1.24)
GFR, EST NON AFRICAN AMERICAN: 60 mL/min — AB (ref >60)
Glucose, Bld: 147 mg/dL — ABNORMAL HIGH (ref 65–99)
Potassium: 4.7 mmol/L (ref 3.5–5.1)
SODIUM: 137 mmol/L (ref 135–145)
Total Bilirubin: 0.7 mg/dL (ref 0.3–1.2)
Total Protein: 7.6 g/dL (ref 6.5–8.1)

## 2015-04-18 LAB — URINALYSIS, ROUTINE W REFLEX MICROSCOPIC
Bilirubin Urine: NEGATIVE
HGB URINE DIPSTICK: NEGATIVE
Ketones, ur: NEGATIVE mg/dL
Leukocytes, UA: NEGATIVE
Nitrite: NEGATIVE
PH: 6.5 (ref 5.0–8.0)
PROTEIN: NEGATIVE mg/dL
SPECIFIC GRAVITY, URINE: 1.029 (ref 1.005–1.030)
Urobilinogen, UA: 0.2 mg/dL (ref 0.0–1.0)

## 2015-04-18 LAB — TYPE AND SCREEN
ABO/RH(D): O POS
Antibody Screen: NEGATIVE

## 2015-04-18 LAB — CBC
HCT: 48.5 % (ref 39.0–52.0)
HEMOGLOBIN: 15.3 g/dL (ref 13.0–17.0)
MCH: 25.9 pg — AB (ref 26.0–34.0)
MCHC: 31.5 g/dL (ref 30.0–36.0)
MCV: 82.1 fL (ref 78.0–100.0)
PLATELETS: 201 10*3/uL (ref 150–400)
RBC: 5.91 MIL/uL — ABNORMAL HIGH (ref 4.22–5.81)
RDW: 17.2 % — ABNORMAL HIGH (ref 11.5–15.5)
WBC: 5.7 10*3/uL (ref 4.0–10.5)

## 2015-04-18 LAB — GLUCOSE, CAPILLARY: Glucose-Capillary: 146 mg/dL — ABNORMAL HIGH (ref 65–99)

## 2015-04-18 LAB — URINE MICROSCOPIC-ADD ON

## 2015-04-18 LAB — SURGICAL PCR SCREEN
MRSA, PCR: NEGATIVE
Staphylococcus aureus: NEGATIVE

## 2015-04-18 LAB — ABO/RH: ABO/RH(D): O POS

## 2015-04-18 LAB — PROTIME-INR
INR: 1.25 (ref 0.00–1.49)
PROTHROMBIN TIME: 15.8 s — AB (ref 11.6–15.2)

## 2015-04-18 LAB — APTT: aPTT: 31 seconds (ref 24–37)

## 2015-04-18 NOTE — Progress Notes (Signed)
   04/18/15 0912  OBSTRUCTIVE SLEEP APNEA  Have you ever been diagnosed with sleep apnea through a sleep study? Yes  If yes, do you have and use a CPAP or BPAP machine every night? 0  Do you snore loudly (loud enough to be heard through closed doors)?  0  Do you often feel tired, fatigued, or sleepy during the daytime (such as falling asleep during driving or talking to someone)? 0  Has anyone observed you stop breathing during your sleep? 1  Do you have, or are you being treated for high blood pressure? 1  BMI more than 35 kg/m2? 0  Age > 50 (1-yes) 1  Neck circumference greater than:Male 16 inches or larger, Male 17inches or larger? 1  Male Gender (Yes=1) 1  Obstructive Sleep Apnea Score 5  Score 5 or greater  Results sent to PCP   This patient has screened at risk for sleep apnea using the STOP bang tool used during a pre-surgical visit. A score of 4 or greater is at risk for sleep apnea.

## 2015-04-18 NOTE — Progress Notes (Signed)
PCP is Kirkland Hun  Cardiologist is Ezzie Dural  Patient denied having any acute cardiac issues. Patient informed Nurse that he was instructed to stop taking Effient on 04/15/15, to start Plavix on 04/16/15, and to continue taking aspirin. During PAT visit patient asked Nurse if he should take Plavix DOS. Nurse called Colletta Maryland at [facility] and left a voicemail regarding patients concern. Colletta Maryland returned called at [phone] and stated that patient was to take Plavix DOS. Nurse instructed patient of this, and patient verbalized understanding.  Nurse inquired about blood glucose and patient stated the highest his blood sugar has been was 200 and the lowest was 55. Last A1C was 7 per patient. Blood glucose was 146 on arrival to PAT. Patient stated he only drank one glass of milk for breakfast.

## 2015-04-19 LAB — HEMOGLOBIN A1C
HEMOGLOBIN A1C: 8.3 % — AB (ref 4.8–5.6)
MEAN PLASMA GLUCOSE: 192 mg/dL

## 2015-04-23 ENCOUNTER — Inpatient Hospital Stay (HOSPITAL_COMMUNITY): Payer: Medicare Other | Admitting: Anesthesiology

## 2015-04-23 ENCOUNTER — Encounter (HOSPITAL_COMMUNITY)
Admission: RE | Disposition: A | Discharge status: Home / Self Care | Payer: Self-pay | Source: Ambulatory Visit | Attending: Vascular Surgery

## 2015-04-23 ENCOUNTER — Inpatient Hospital Stay (HOSPITAL_COMMUNITY)
Admission: RE | Admit: 2015-04-23 | Discharge: 2015-04-24 | DRG: 039 | Disposition: A | Discharge status: Home / Self Care | Payer: Medicare Other | Source: Ambulatory Visit | Attending: Vascular Surgery | Admitting: Vascular Surgery

## 2015-04-23 ENCOUNTER — Encounter (HOSPITAL_COMMUNITY): Payer: Self-pay | Admitting: *Deleted

## 2015-04-23 DIAGNOSIS — Z8249 Family history of ischemic heart disease and other diseases of the circulatory system: Secondary | ICD-10-CM

## 2015-04-23 DIAGNOSIS — Z833 Family history of diabetes mellitus: Secondary | ICD-10-CM

## 2015-04-23 DIAGNOSIS — I6522 Occlusion and stenosis of left carotid artery: Secondary | ICD-10-CM | POA: Diagnosis present

## 2015-04-23 DIAGNOSIS — G4730 Sleep apnea, unspecified: Secondary | ICD-10-CM | POA: Diagnosis present

## 2015-04-23 DIAGNOSIS — K219 Gastro-esophageal reflux disease without esophagitis: Secondary | ICD-10-CM | POA: Diagnosis present

## 2015-04-23 DIAGNOSIS — I779 Disorder of arteries and arterioles, unspecified: Secondary | ICD-10-CM | POA: Diagnosis present

## 2015-04-23 DIAGNOSIS — Z888 Allergy status to other drugs, medicaments and biological substances status: Secondary | ICD-10-CM

## 2015-04-23 DIAGNOSIS — Z886 Allergy status to analgesic agent status: Secondary | ICD-10-CM

## 2015-04-23 DIAGNOSIS — Z7982 Long term (current) use of aspirin: Secondary | ICD-10-CM | POA: Diagnosis not present

## 2015-04-23 DIAGNOSIS — Z885 Allergy status to narcotic agent status: Secondary | ICD-10-CM

## 2015-04-23 DIAGNOSIS — E780 Pure hypercholesterolemia: Secondary | ICD-10-CM | POA: Diagnosis present

## 2015-04-23 DIAGNOSIS — I10 Essential (primary) hypertension: Secondary | ICD-10-CM | POA: Diagnosis present

## 2015-04-23 DIAGNOSIS — Z794 Long term (current) use of insulin: Secondary | ICD-10-CM | POA: Diagnosis not present

## 2015-04-23 DIAGNOSIS — Z87891 Personal history of nicotine dependence: Secondary | ICD-10-CM | POA: Diagnosis not present

## 2015-04-23 DIAGNOSIS — Z955 Presence of coronary angioplasty implant and graft: Secondary | ICD-10-CM | POA: Diagnosis not present

## 2015-04-23 DIAGNOSIS — E1142 Type 2 diabetes mellitus with diabetic polyneuropathy: Secondary | ICD-10-CM | POA: Diagnosis present

## 2015-04-23 DIAGNOSIS — Z7902 Long term (current) use of antithrombotics/antiplatelets: Secondary | ICD-10-CM | POA: Diagnosis not present

## 2015-04-23 DIAGNOSIS — I739 Peripheral vascular disease, unspecified: Secondary | ICD-10-CM

## 2015-04-23 DIAGNOSIS — E1151 Type 2 diabetes mellitus with diabetic peripheral angiopathy without gangrene: Secondary | ICD-10-CM | POA: Diagnosis present

## 2015-04-23 HISTORY — PX: PATCH ANGIOPLASTY: SHX6230

## 2015-04-23 HISTORY — PX: ENDARTERECTOMY: SHX5162

## 2015-04-23 LAB — CBC
HCT: 43.9 % (ref 39.0–52.0)
Hemoglobin: 13.6 g/dL (ref 13.0–17.0)
MCH: 25.1 pg — ABNORMAL LOW (ref 26.0–34.0)
MCHC: 31 g/dL (ref 30.0–36.0)
MCV: 81.1 fL (ref 78.0–100.0)
PLATELETS: 171 10*3/uL (ref 150–400)
RBC: 5.41 MIL/uL (ref 4.22–5.81)
RDW: 17.2 % — AB (ref 11.5–15.5)
WBC: 8.4 10*3/uL (ref 4.0–10.5)

## 2015-04-23 LAB — GLUCOSE, CAPILLARY
GLUCOSE-CAPILLARY: 139 mg/dL — AB (ref 65–99)
Glucose-Capillary: 165 mg/dL — ABNORMAL HIGH (ref 65–99)
Glucose-Capillary: 147 mg/dL — ABNORMAL HIGH (ref 65–99)
Glucose-Capillary: 136 mg/dL — ABNORMAL HIGH (ref 65–99)

## 2015-04-23 LAB — CREATININE, SERUM
Creatinine, Ser: 1.14 mg/dL (ref 0.61–1.24)
GFR calc non Af Amer: >60 mL/min (ref >60)

## 2015-04-23 SURGERY — ENDARTERECTOMY, CAROTID
Anesthesia: General | Site: Neck | Laterality: Left

## 2015-04-23 MED ORDER — HYDROMORPHONE HCL 1 MG/ML IJ SOLN: 0.5000 mg | INTRAMUSCULAR | Status: DC | PRN

## 2015-04-23 MED ORDER — PROMETHAZINE HCL 25 MG/ML IJ SOLN: 6.2500 mg | INTRAMUSCULAR | Status: DC | PRN

## 2015-04-23 MED ORDER — LACTATED RINGERS IV SOLN
INTRAVENOUS | Status: DC | PRN
  Administered 2015-04-23 (×2): via INTRAVENOUS

## 2015-04-23 MED ORDER — INSULIN GLARGINE 100 UNIT/ML ~~LOC~~ SOLN
56.0000 [IU] | Freq: Every day | SUBCUTANEOUS | Status: DC
  Administered 2015-04-23: 56 [IU] via SUBCUTANEOUS
  Filled 2015-04-23 (×2): qty 0.56

## 2015-04-23 MED ORDER — PHENOL 1.4 % MT LIQD: 1.0000 | OROMUCOSAL | Status: DC | PRN

## 2015-04-23 MED ORDER — SODIUM CHLORIDE 0.9 % IJ SOLN
INTRAMUSCULAR | Status: AC
  Filled 2015-04-23: qty 10

## 2015-04-23 MED ORDER — DEXTROSE 5 % IV SOLN
20.0000 mg | INTRAVENOUS | Status: DC | PRN
  Administered 2015-04-23: 20 ug/min via INTRAVENOUS

## 2015-04-23 MED ORDER — ONDANSETRON HCL 4 MG/2ML IJ SOLN
INTRAMUSCULAR | Status: DC | PRN
Start: 2015-04-23 — End: 2015-04-23
  Administered 2015-04-23: 4 mg via INTRAVENOUS

## 2015-04-23 MED ORDER — ENOXAPARIN SODIUM 40 MG/0.4ML ~~LOC~~ SOLN
40.0000 mg | SUBCUTANEOUS | Status: DC
  Administered 2015-04-24: 40 mg via SUBCUTANEOUS
  Filled 2015-04-23 (×2): qty 0.4

## 2015-04-23 MED ORDER — ASPIRIN 81 MG PO TABS: 81.0000 mg | ORAL_TABLET | Freq: Every day | ORAL | Status: DC

## 2015-04-23 MED ORDER — HYDROMORPHONE HCL 1 MG/ML IJ SOLN
0.2500 mg | INTRAMUSCULAR | Status: DC | PRN
  Administered 2015-04-23 (×2): 0.5 mg via INTRAVENOUS

## 2015-04-23 MED ORDER — LIDOCAINE HCL (CARDIAC) 20 MG/ML IV SOLN
INTRAVENOUS | Status: AC
  Filled 2015-04-23: qty 5

## 2015-04-23 MED ORDER — LABETALOL HCL 5 MG/ML IV SOLN: 10.0000 mg | INTRAVENOUS | Status: DC | PRN

## 2015-04-23 MED ORDER — ATORVASTATIN CALCIUM 40 MG PO TABS
40.0000 mg | ORAL_TABLET | Freq: Every day | ORAL | Status: DC
  Administered 2015-04-24: 40 mg via ORAL
  Filled 2015-04-23 (×2): qty 1

## 2015-04-23 MED ORDER — FENTANYL CITRATE (PF) 250 MCG/5ML IJ SOLN
INTRAMUSCULAR | Status: DC | PRN
  Administered 2015-04-23: 100 ug via INTRAVENOUS

## 2015-04-23 MED ORDER — PREGABALIN 100 MG PO CAPS
100.0000 mg | ORAL_CAPSULE | ORAL | Status: DC
  Administered 2015-04-24: 100 mg via ORAL
  Filled 2015-04-23: qty 1

## 2015-04-23 MED ORDER — HEPARIN SODIUM (PORCINE) 5000 UNIT/ML IJ SOLN
INTRAMUSCULAR | Status: DC | PRN
  Administered 2015-04-23: 500 mL

## 2015-04-23 MED ORDER — BISACODYL 10 MG RE SUPP: 10.0000 mg | Freq: Every day | RECTAL | Status: DC | PRN

## 2015-04-23 MED ORDER — HEPARIN SODIUM (PORCINE) 1000 UNIT/ML IJ SOLN
INTRAMUSCULAR | Status: AC
  Filled 2015-04-23: qty 1

## 2015-04-23 MED ORDER — METFORMIN HCL 500 MG PO TABS
1000.0000 mg | ORAL_TABLET | Freq: Two times a day (BID) | ORAL | Status: DC
  Administered 2015-04-24: 1000 mg via ORAL
  Filled 2015-04-23 (×4): qty 2

## 2015-04-23 MED ORDER — HEPARIN SODIUM (PORCINE) 1000 UNIT/ML IJ SOLN
INTRAMUSCULAR | Status: DC | PRN
  Administered 2015-04-23: 11000 [IU] via INTRAVENOUS

## 2015-04-23 MED ORDER — NITROGLYCERIN 0.4 MG SL SUBL: 0.4000 mg | SUBLINGUAL_TABLET | SUBLINGUAL | Status: DC | PRN

## 2015-04-23 MED ORDER — CHLORHEXIDINE GLUCONATE CLOTH 2 % EX PADS: 6.0000 | MEDICATED_PAD | Freq: Once | CUTANEOUS | Status: DC

## 2015-04-23 MED ORDER — PHENYLEPHRINE 40 MCG/ML (10ML) SYRINGE FOR IV PUSH (FOR BLOOD PRESSURE SUPPORT)
PREFILLED_SYRINGE | INTRAVENOUS | Status: AC
  Filled 2015-04-23: qty 10

## 2015-04-23 MED ORDER — POTASSIUM CHLORIDE CRYS ER 20 MEQ PO TBCR: 20.0000 meq | EXTENDED_RELEASE_TABLET | Freq: Every day | ORAL | Status: DC | PRN

## 2015-04-23 MED ORDER — LIDOCAINE HCL (CARDIAC) 20 MG/ML IV SOLN
INTRAVENOUS | Status: DC | PRN
  Administered 2015-04-23 (×2): 60 mg via INTRAVENOUS

## 2015-04-23 MED ORDER — CLOPIDOGREL BISULFATE 75 MG PO TABS
75.0000 mg | ORAL_TABLET | Freq: Every day | ORAL | Status: DC
  Filled 2015-04-23: qty 1

## 2015-04-23 MED ORDER — THROMBIN 20000 UNITS EX SOLR
CUTANEOUS | Status: AC
  Filled 2015-04-23: qty 20000

## 2015-04-23 MED ORDER — SODIUM CHLORIDE 0.9 % IV SOLN: INTRAVENOUS | Status: DC

## 2015-04-23 MED ORDER — 0.9 % SODIUM CHLORIDE (POUR BTL) OPTIME
TOPICAL | Status: DC | PRN
  Administered 2015-04-23: 4000 mL

## 2015-04-23 MED ORDER — DEXTROSE 5 % IV SOLN
1.5000 g | INTRAVENOUS | Status: AC
  Administered 2015-04-23: 1.5 g via INTRAVENOUS

## 2015-04-23 MED ORDER — GUAIFENESIN-DM 100-10 MG/5ML PO SYRP: 15.0000 mL | ORAL_SOLUTION | ORAL | Status: DC | PRN

## 2015-04-23 MED ORDER — DOCUSATE SODIUM 100 MG PO CAPS
100.0000 mg | ORAL_CAPSULE | Freq: Every day | ORAL | Status: DC
  Administered 2015-04-24: 100 mg via ORAL
  Filled 2015-04-23: qty 1

## 2015-04-23 MED ORDER — ONDANSETRON HCL 4 MG/2ML IJ SOLN: 4.0000 mg | Freq: Four times a day (QID) | INTRAMUSCULAR | Status: DC | PRN

## 2015-04-23 MED ORDER — ACETAMINOPHEN 325 MG PO TABS
650.0000 mg | ORAL_TABLET | Freq: Every evening | ORAL | Status: DC | PRN
  Administered 2015-04-24: 650 mg via ORAL
  Filled 2015-04-23: qty 2

## 2015-04-23 MED ORDER — CANAGLIFLOZIN 100 MG PO TABS
100.0000 mg | ORAL_TABLET | Freq: Every day | ORAL | Status: DC
  Administered 2015-04-24: 100 mg via ORAL
  Filled 2015-04-23 (×2): qty 1

## 2015-04-23 MED ORDER — HYDROMORPHONE HCL 1 MG/ML IJ SOLN
INTRAMUSCULAR | Status: AC
  Filled 2015-04-23: qty 1

## 2015-04-23 MED ORDER — LIDOCAINE HCL (PF) 1 % IJ SOLN
INTRAMUSCULAR | Status: AC
  Filled 2015-04-23: qty 30

## 2015-04-23 MED ORDER — PANTOPRAZOLE SODIUM 40 MG PO TBEC
40.0000 mg | DELAYED_RELEASE_TABLET | Freq: Every day | ORAL | Status: DC
  Administered 2015-04-23 – 2015-04-24 (×2): 40 mg via ORAL
  Filled 2015-04-23 (×2): qty 1

## 2015-04-23 MED ORDER — PROTAMINE SULFATE 10 MG/ML IV SOLN
INTRAVENOUS | Status: DC | PRN
  Administered 2015-04-23 (×3): 10 mg via INTRAVENOUS

## 2015-04-23 MED ORDER — DEXTROSE 5 % IV SOLN
1.5000 g | Freq: Two times a day (BID) | INTRAVENOUS | Status: AC
  Administered 2015-04-23 – 2015-04-24 (×2): 1.5 g via INTRAVENOUS
  Filled 2015-04-23 (×2): qty 1.5

## 2015-04-23 MED ORDER — DEXTRAN 40 IN SALINE 10-0.9 % IV SOLN
INTRAVENOUS | Status: AC
  Filled 2015-04-23: qty 500

## 2015-04-23 MED ORDER — COLCHICINE 0.6 MG PO TABS
0.6000 mg | ORAL_TABLET | ORAL | Status: DC | PRN
  Filled 2015-04-23: qty 1

## 2015-04-23 MED ORDER — DORZOLAMIDE HCL-TIMOLOL MAL 2-0.5 % OP SOLN
1.0000 [drp] | Freq: Two times a day (BID) | OPHTHALMIC | Status: DC
  Administered 2015-04-23 – 2015-04-24 (×2): 1 [drp] via OPHTHALMIC
  Filled 2015-04-23: qty 10

## 2015-04-23 MED ORDER — SUCCINYLCHOLINE CHLORIDE 20 MG/ML IJ SOLN
INTRAMUSCULAR | Status: AC
  Filled 2015-04-23: qty 1

## 2015-04-23 MED ORDER — REMIFENTANIL HCL 1 MG IV SOLR
INTRAVENOUS | Status: DC | PRN
  Administered 2015-04-23: .2 ug/kg/min via INTRAVENOUS

## 2015-04-23 MED ORDER — FENTANYL CITRATE (PF) 250 MCG/5ML IJ SOLN
INTRAMUSCULAR | Status: AC
  Filled 2015-04-23: qty 5

## 2015-04-23 MED ORDER — ASPIRIN EC 81 MG PO TBEC
81.0000 mg | DELAYED_RELEASE_TABLET | Freq: Every day | ORAL | Status: DC
  Administered 2015-04-24: 81 mg via ORAL
  Filled 2015-04-23: qty 1

## 2015-04-23 MED ORDER — METOPROLOL TARTRATE 25 MG PO TABS
25.0000 mg | ORAL_TABLET | Freq: Two times a day (BID) | ORAL | Status: DC
  Administered 2015-04-24: 25 mg via ORAL
  Filled 2015-04-23 (×3): qty 1

## 2015-04-23 MED ORDER — LISINOPRIL 10 MG PO TABS
10.0000 mg | ORAL_TABLET | Freq: Every day | ORAL | Status: DC
  Administered 2015-04-24: 10 mg via ORAL
  Filled 2015-04-23 (×2): qty 1

## 2015-04-23 MED ORDER — ISOSORBIDE MONONITRATE 15 MG HALF TABLET
15.0000 mg | ORAL_TABLET | Freq: Every day | ORAL | Status: DC
  Administered 2015-04-23 – 2015-04-24 (×2): 15 mg via ORAL
  Filled 2015-04-23 (×2): qty 1

## 2015-04-23 MED ORDER — ROCURONIUM BROMIDE 100 MG/10ML IV SOLN
INTRAVENOUS | Status: DC | PRN
  Administered 2015-04-23: 50 mg via INTRAVENOUS

## 2015-04-23 MED ORDER — ALUM & MAG HYDROXIDE-SIMETH 200-200-20 MG/5ML PO SUSP
15.0000 mL | ORAL | Status: DC | PRN
Start: 2015-04-23 — End: 2015-04-24

## 2015-04-23 MED ORDER — DIPHENHYDRAMINE HCL 25 MG PO CAPS
25.0000 mg | ORAL_CAPSULE | Freq: Every evening | ORAL | Status: DC | PRN
  Administered 2015-04-24: 25 mg via ORAL
  Filled 2015-04-23 (×3): qty 1

## 2015-04-23 MED ORDER — SODIUM CHLORIDE 0.9 % IV SOLN
INTRAVENOUS | Status: DC
  Administered 2015-04-23 (×2): via INTRAVENOUS

## 2015-04-23 MED ORDER — METOPROLOL TARTRATE 1 MG/ML IV SOLN: 2.0000 mg | INTRAVENOUS | Status: DC | PRN

## 2015-04-23 MED ORDER — MAGNESIUM SULFATE 2 GM/50ML IV SOLN: 2.0000 g | Freq: Every day | INTRAVENOUS | Status: DC | PRN

## 2015-04-23 MED ORDER — INSULIN ASPART 100 UNIT/ML ~~LOC~~ SOLN
0.0000 [IU] | Freq: Three times a day (TID) | SUBCUTANEOUS | Status: DC
  Administered 2015-04-24: 2 [IU] via SUBCUTANEOUS

## 2015-04-23 MED ORDER — DEXTROSE 5 % IV SOLN
INTRAVENOUS | Status: AC
  Filled 2015-04-23: qty 1.5

## 2015-04-23 MED ORDER — OXYCODONE-ACETAMINOPHEN 5-325 MG PO TABS
1.0000 | ORAL_TABLET | ORAL | Status: DC | PRN
  Administered 2015-04-23 (×2): 2 via ORAL
  Filled 2015-04-23 (×2): qty 2

## 2015-04-23 MED ORDER — SENNOSIDES-DOCUSATE SODIUM 8.6-50 MG PO TABS
1.0000 | ORAL_TABLET | Freq: Every evening | ORAL | Status: DC | PRN
  Filled 2015-04-23: qty 1

## 2015-04-23 MED ORDER — ONDANSETRON HCL 4 MG/2ML IJ SOLN
INTRAMUSCULAR | Status: AC
  Filled 2015-04-23: qty 2

## 2015-04-23 MED ORDER — EPHEDRINE SULFATE 50 MG/ML IJ SOLN
INTRAMUSCULAR | Status: AC
  Filled 2015-04-23: qty 1

## 2015-04-23 MED ORDER — PROPOFOL 10 MG/ML IV BOLUS
INTRAVENOUS | Status: DC | PRN
  Administered 2015-04-23: 180 mg via INTRAVENOUS

## 2015-04-23 MED ORDER — DEXTRAN 40 IN SALINE 10-0.9 % IV SOLN
INTRAVENOUS | Status: DC | PRN
  Administered 2015-04-23: 500 mL

## 2015-04-23 MED ORDER — THROMBIN 20000 UNITS EX SOLR
CUTANEOUS | Status: DC | PRN
  Administered 2015-04-23: 20 mL via TOPICAL

## 2015-04-23 MED ORDER — PROPOFOL 10 MG/ML IV BOLUS
INTRAVENOUS | Status: AC
  Filled 2015-04-23: qty 20

## 2015-04-23 MED ORDER — SODIUM CHLORIDE 0.9 % IV SOLN
500.0000 mL | Freq: Once | INTRAVENOUS | Status: AC | PRN
  Administered 2015-04-23: 500 mL via INTRAVENOUS

## 2015-04-23 MED ORDER — ROCURONIUM BROMIDE 50 MG/5ML IV SOLN
INTRAVENOUS | Status: AC
  Filled 2015-04-23: qty 1

## 2015-04-23 MED ORDER — HYDRALAZINE HCL 20 MG/ML IJ SOLN: 5.0000 mg | INTRAMUSCULAR | Status: DC | PRN

## 2015-04-23 SURGICAL SUPPLY — 56 items
ADPR TBG 2 MALE LL ART (MISCELLANEOUS)
BAG DECANTER FOR FLEXI CONT (MISCELLANEOUS) ×3 IMPLANT
CANISTER SUCTION 2500CC (MISCELLANEOUS) ×3 IMPLANT
CATH ROBINSON RED A/P 18FR (CATHETERS) ×3 IMPLANT
CATH SUCT 10FR WHISTLE TIP (CATHETERS) IMPLANT
CLIP TI MEDIUM 6 (CLIP) ×3 IMPLANT
CLIP TI WIDE RED SMALL 6 (CLIP) ×3 IMPLANT
COVER PROBE W GEL 5X96 (DRAPES) IMPLANT
COVER TRANSDUCER ULTRASND GEL (DRAPE) ×3 IMPLANT
CRADLE DONUT ADULT HEAD (MISCELLANEOUS) ×3 IMPLANT
ELECT REM PT RETURN 9FT ADLT (ELECTROSURGICAL) ×3
ELECTRODE REM PT RTRN 9FT ADLT (ELECTROSURGICAL) ×2 IMPLANT
GAUZE SPONGE 4X4 12PLY STRL (GAUZE/BANDAGES/DRESSINGS) ×6 IMPLANT
GLOVE BIO SURGEON STRL SZ7 (GLOVE) ×3 IMPLANT
GLOVE BIOGEL PI IND STRL 7.0 (GLOVE) ×2 IMPLANT
GLOVE BIOGEL PI IND STRL 7.5 (GLOVE) ×4 IMPLANT
GLOVE BIOGEL PI IND STRL 8 (GLOVE) ×2 IMPLANT
GLOVE BIOGEL PI INDICATOR 7.0 (GLOVE) ×1
GLOVE BIOGEL PI INDICATOR 7.5 (GLOVE) ×2
GLOVE BIOGEL PI INDICATOR 8 (GLOVE) ×1
GLOVE ECLIPSE 6.5 STRL STRAW (GLOVE) ×3 IMPLANT
GLOVE ECLIPSE 7.0 STRL STRAW (GLOVE) ×3 IMPLANT
GOWN STRL REUS W/ TWL LRG LVL3 (GOWN DISPOSABLE) ×6 IMPLANT
GOWN STRL REUS W/TWL LRG LVL3 (GOWN DISPOSABLE) ×9
IV ADAPTER SYR DOUBLE MALE LL (MISCELLANEOUS) IMPLANT
KIT BASIN OR (CUSTOM PROCEDURE TRAY) ×3 IMPLANT
KIT ROOM TURNOVER OR (KITS) ×3 IMPLANT
KIT SHUNT ARGYLE CAROTID ART 6 (VASCULAR PRODUCTS) ×3 IMPLANT
LIQUID BAND (GAUZE/BANDAGES/DRESSINGS) ×3 IMPLANT
NS IRRIG 1000ML POUR BTL (IV SOLUTION) ×12 IMPLANT
PACK CAROTID (CUSTOM PROCEDURE TRAY) ×3 IMPLANT
PAD ARMBOARD 7.5X6 YLW CONV (MISCELLANEOUS) ×6 IMPLANT
PATCH VASC XENOSURE 1CMX6CM (Vascular Products) ×2 IMPLANT
PATCH VASC XENOSURE 1X6 (Vascular Products) ×2 IMPLANT
SET COLLECT BLD 21X3/4 12 PB (MISCELLANEOUS) IMPLANT
SHUNT CAROTID BYPASS 10 (VASCULAR PRODUCTS) IMPLANT
SHUNT CAROTID BYPASS 12FRX15.5 (VASCULAR PRODUCTS) IMPLANT
SPONGE INTESTINAL PEANUT (DISPOSABLE) ×3 IMPLANT
SPONGE SURGIFOAM ABS GEL 100 (HEMOSTASIS) IMPLANT
STOPCOCK 4 WAY LG BORE MALE ST (IV SETS) IMPLANT
SUT ETHILON 3 0 PS 1 (SUTURE) IMPLANT
SUT MNCRL AB 4-0 PS2 18 (SUTURE) ×3 IMPLANT
SUT PROLENE 6 0 BV (SUTURE) ×9 IMPLANT
SUT PROLENE 7 0 BV 1 (SUTURE) ×3 IMPLANT
SUT SILK 3 0 (SUTURE) ×3
SUT SILK 3 0 TIES 17X18 (SUTURE)
SUT SILK 3-0 18XBRD TIE 12 (SUTURE) ×2 IMPLANT
SUT SILK 3-0 18XBRD TIE BLK (SUTURE) IMPLANT
SUT VIC AB 3-0 SH 27 (SUTURE) ×3
SUT VIC AB 3-0 SH 27X BRD (SUTURE) ×2 IMPLANT
SYR TB 1ML LUER SLIP (SYRINGE) IMPLANT
SYRINGE 20CC LL (MISCELLANEOUS) ×3 IMPLANT
SYSTEM CHEST DRAIN TLS 7FR (DRAIN) IMPLANT
TUBING ART PRESS 48 MALE/FEM (TUBING) IMPLANT
TUBING EXTENTION W/L.L. (IV SETS) IMPLANT
WATER STERILE IRR 1000ML POUR (IV SOLUTION) ×3 IMPLANT

## 2015-04-23 NOTE — Progress Notes (Signed)
Pt with symmetrical smile and frown, tongue midline with equal movement side to side.

## 2015-04-23 NOTE — Progress Notes (Signed)
UR COMPLETED  

## 2015-04-23 NOTE — Progress Notes (Signed)
Pt continues with tongue midline, equal movement side to side. Symmetrical smile and frown. Strong bilat hand grips and plantar and dorsiflexion.

## 2015-04-23 NOTE — Transfer of Care (Signed)
Immediate Anesthesia Transfer of Care Note  Patient: Seth Young  Procedure(s) Performed: Procedure(s): ENDARTERECTOMY CAROTID (Left) PATCH ANGIOPLASTY USING 1CM X 6CM XENOSURE BIOLOGIC PATCH (Left)  Patient Location: PACU  Anesthesia Type:General  Level of Consciousness: awake, alert , oriented and patient cooperative  Airway & Oxygen Therapy: Patient Spontanous Breathing and Patient connected to nasal cannula oxygen  Post-op Assessment: Report given to RN, Post -op Vital signs reviewed and stable and Patient moving all extremities  Post vital signs: Reviewed and stable  Last Vitals:  Filed Vitals:   04/23/15 1136  BP:   Pulse:   Temp: 37.1 C  Resp:     Complications: No apparent anesthesia complications

## 2015-04-23 NOTE — Op Note (Signed)
OPERATIVE NOTE  PROCEDURE:   1.  left carotid endarterectomy with bovine patch angioplasty 2.  left intraoperative carotid ultrasound  PRE-OPERATIVE DIAGNOSIS: left symptomatic carotid stenosis  POST-OPERATIVE DIAGNOSIS: same as above   SURGEON: Adele Barthel, MD  ASSISTANT(S): Gerri Lins, PAC   ANESTHESIA: general  ESTIMATED BLOOD LOSS: 50 cc  FINDING(S): 1.  Continuous Doppler audible flow signatures are appropriate for each carotid artery. 2.  No evidence of intimal flap visualized on transverse or longitudinal ultrasonography. 3.  Necrotic core carotid plaque which nearly occludes lumen of left internal carotid artery   SPECIMEN(S):  Carotid plaque (sent to Pathology)  INDICATIONS:   Seth Young is a 74 y.o. male who presents with left symptomatic carotid stenosis >90%.  I discussed with the patient the risks, benefits, and alternatives to carotid endarterectomy.  The patient is not a candidate for carotid artery stenting. I discussed the procedural details of carotid endarterectomy with the patient.  The patient is aware that the risks of carotid endarterectomy include but are not limited to: bleeding, infection, stroke, myocardial infarction, death, cranial nerve injuries both temporary and permanent, neck hematoma, possible airway compromise, labile blood pressure post-operatively, cerebral hyperperfusion syndrome, and possible need for additional interventions in the future. The patient is aware of the risks and agrees to proceed forward with the procedure.  DESCRIPTION: After full informed written consent was obtained from the patient, the patient was brought back to the operating room and placed supine upon the operating table.  Prior to induction, the patient received IV antibiotics.  After obtaining adequate anesthesia, the patient was placed into semi-Fowler position with a shoulder roll in place and the patient's neck slightly hyperextended and rotated away from  the surgical site.  The patient was prepped in the standard fashion for a left carotid endarterectomy.  I made an incision anterior to the sternocleidomastoid muscle and dissected down through the subcutaneous tissue.  The platysmas was opened with electrocautery.  Then I dissected down to the internal jugular vein.  This was dissected posteriorly until I obtained visualization of the common carotid artery.  This was dissected out and then an umbilical tape was placed around the common carotid artery and I loosely applied a Rumel tourniquet.  I then dissected in a periadventitial fashion along the common carotid artery up to the bifurcation.  I then identified the external carotid artery and the superior thyroid artery.  A 2-0 silk tie was looped around the superior thyroid artery, and I also dissected out the external carotid artery and placed a vessel loop around it.  In continuing the dissection to the internal carotid artery, I identified the facial vein.  This was ligated and then transected, giving me improved exposure of the internal carotid artery.  In the process of this dissection, the hypoglossal nerve was identified.  I then dissected out the internal carotid artery until I identified an area of soft tissue in the internal carotid artery.  I dissected slightly distal to this area, and placed an umbilical tape around the artery and loosely applied a Rumel tourniquet.  At this point, we gave the patient a therapeutic bolus of Heparin intravenously (roughly 80 units/kg).  After waiting 3 minutes, then I clamped the internal carotid artery, external carotid artery and then the common carotid artery.  I then made an arteriotomy in the common carotid artery with a 11 blade, and extended the arteriotomy with a Potts scissor down into the common carotid artery, then I  carried the arteriotomy through the bifurcation into the internal carotid artery until I reached an area that was not diseased.  At this point, I  took the 10 shunt that previously been prepared and I inserted it into the internal carotid artery.  The Rumel tourniquet was then applied to this end of the shunt.  I unclamped the shunt to verify retrograde blood flow in the internal carotid artery.  I then placed the other end of the shunt into the common carotid artery after unclamping the artery.  The Rumel was tightened down around the shunt.  At this point, I verified blood flow in the shunt with a continuous doppler.  At this point, I started the endarterectomy in the common carotid artery with a Technical brewer and carried this dissection down into the common carotid artery circumferentially.  Then I transected the plaque at a segment where it was adherent.  I then carried this dissection up into the external carotid artery.  The plaque was extracted by unclamping the external carotid artery and everting the artery.  The dissection was then carried into the internal carotid artery, extracting the remaining portion of the carotid plaque.  I passed the plaque off the field as a specimen.  I then spent the next 30 minutes removing intimal flaps and loose debris.  Eventually I reached the point where the residual plaque was densely adherent and any further dissection would compromise the integrity of the wall.  After verifying that there was no more loose intimal flaps or debris, I re-interrogated the entirety of this carotid artery.  At this point, I was satisfied that the minimal remaining disease was densely adherent to the wall and wall integrity was intact.  At this point, I then fashioned a bovine pericardial patch for the geometry of this artery and sewed it in place with two running stitch of 6-0 Prolene, one from each end.  Prior to completing this patch angioplasty, I removed the shunt first from the internal carotid artery, from which there was excellent backbleeding, and clamped it.  Then I removed the shunt from the common carotid artery, from  which there was excellent antegrade bleeding, and then clamped it.  At this point, I allowed the external carotid artery to backbleed, which was excellent.  Then I instilled heparinized saline in this patched artery and then completed the patch angioplasty in the usual fashion.  First, I released the clamp on the external carotid artery, then I released it on the common carotid artery.  After waiting a few seconds, I then released it on the internal carotid artery.  I then interrogated this patient's arteries with the continuous Doppler.  The audible waveforms in each artery were consistent with the expected characteristics for each artery.  The Sonosite probe was then sterilely draped and used to interrogate the carotid artery in both longitudinal and transverse views.  At this point, I washed out the wound, and placed thrombin and Gelfoam throughout.  I also gave the patient 30 mg of protamine to reverse his anticoagulation.   After waiting a few minutes, I removed the thrombin and Gelfoam and washed out the wound.  There was no more active bleeding in the surgical site.   I then reapproximated the platysma muscle with a running stitch of 3-0 Vicryl.  The skin was then reapproximated with a running subcuticular 4-0 Monocryl stitch.  The skin was then cleaned, dried and Dermabond was used to reinforce the skin closure.  The patient woke  without any problems, neurologically intact.      COMPLICATIONS: none  CONDITION: stable   Adele Barthel, MD [facility] Office: [phone] Pager: [phone]  04/23/2015, 11:11 AM

## 2015-04-23 NOTE — Interval H&P Note (Signed)
History and Physical Interval Note:  04/23/2015 7:43 AM  Seth Young  has presented today for surgery, with the diagnosis of Left carotid artery stenosis I65.22  The various methods of treatment have been discussed with the patient and family. After consideration of risks, benefits and other options for treatment, the patient has consented to  Procedure(s): ENDARTERECTOMY CAROTID (Left) as a surgical intervention .  The patient's history has been reviewed, patient examined, no change in status, stable for surgery.  I have reviewed the patient's chart and labs.  Questions were answered to the patient's satisfaction.     Adele Barthel

## 2015-04-23 NOTE — Anesthesia Postprocedure Evaluation (Signed)
Anesthesia Post Note  Patient: Seth Young  Procedure(s) Performed: Procedure(s) (LRB): ENDARTERECTOMY CAROTID (Left) PATCH ANGIOPLASTY USING 1CM X 6CM XENOSURE BIOLOGIC PATCH (Left)  Anesthesia type: general  Patient location: PACU  Post pain: Pain level controlled  Post assessment: Patient's Cardiovascular Status Stable  Last Vitals:  Filed Vitals:   04/23/15 1236  BP: 127/67  Pulse: 72  Temp:   Resp: 14    Post vital signs: Reviewed and stable  Level of consciousness: sedated  Complications: No apparent anesthesia complications

## 2015-04-23 NOTE — Plan of Care (Signed)
Problem: Phase I Progression Outcomes Goal: Vascular site scale level 0 - I Vascular Site Scale Level 0: No bruising/bleeding/hematoma Level I (Mild): Bruising/Ecchymosis, minimal bleeding/ooozing, palpable hematoma < 3 cm Level II (Moderate): Bleeding not affecting hemodynamic parameters, pseudoaneurysm, palpable hematoma > 3 cm Level III (Severe) Bleeding which affects hemodynamic parameters or retroperitoneal hemorrhage  Outcome: Completed/Met Date Met:  04/23/15 Level zero  Comments:  Pt without bruising or swelling to left neck.

## 2015-04-23 NOTE — Progress Notes (Signed)
  Day of Surgery Note    Subjective:  No complaints - wants to know if he can travel to [facility] before his follow up appt.  Filed Vitals:   04/23/15 1507  BP: 99/59  Pulse: 67  Temp: 98.6 F (37 C)  Resp: 14    Incisions:   C/d/i without hematoma Extremities:  5/5 BUE & BLE  Lungs:  Non labored Neuro:  In tact; tongue is midline  Assessment/Plan:  This is a 74 y.o. male who is s/p left carotid endarterectomy  -pt doing well this afternoon -did receive a 500cc bolus for BP, which has improved -anticipate discharge tomorrow if he does well this evening -will d/w Dr. Bridgett Larsson about pt wanting to travel   Leontine Locket, PA-C 04/23/2015 3:28 PM

## 2015-04-23 NOTE — Anesthesia Preprocedure Evaluation (Addendum)
Anesthesia Evaluation  Patient identified by MRN, date of birth, ID band Patient awake    Reviewed: Allergy & Precautions, NPO status , Patient's Chart, lab work & pertinent test results  History of Anesthesia Complications Negative for: history of anesthetic complications  Airway Mallampati: II  TM Distance: >3 FB Neck ROM: Full    Dental  (+) Edentulous Upper, Edentulous Lower, Dental Advisory Given   Pulmonary sleep apnea , former smoker,    Pulmonary exam normal        Cardiovascular hypertension, + angina + Peripheral Vascular Disease  Normal cardiovascular exam     Neuro/Psych  Neuromuscular disease negative psych ROS   GI/Hepatic Neg liver ROS, hiatal hernia, GERD  ,  Endo/Other  diabetes  Renal/GU negative Renal ROS     Musculoskeletal   Abdominal   Peds  Hematology   Anesthesia Other Findings   Reproductive/Obstetrics                            Anesthesia Physical Anesthesia Plan  ASA: III  Anesthesia Plan: General   Post-op Pain Management:    Induction: Intravenous  Airway Management Planned: Oral ETT  Additional Equipment: Arterial line  Intra-op Plan:   Post-operative Plan: Extubation in OR  Informed Consent: I have reviewed the patients History and Physical, chart, labs and discussed the procedure including the risks, benefits and alternatives for the proposed anesthesia with the patient or authorized representative who has indicated his/her understanding and acceptance.   Dental advisory given  Plan Discussed with: CRNA, Anesthesiologist and Surgeon  Anesthesia Plan Comments:        Anesthesia Quick Evaluation

## 2015-04-23 NOTE — Anesthesia Procedure Notes (Signed)
Procedure Name: Intubation Date/Time: 04/23/2015 8:39 AM Performed by: Julian Reil Pre-anesthesia Checklist: Patient identified, Emergency Drugs available, Suction available and Patient being monitored Patient Re-evaluated:Patient Re-evaluated prior to inductionOxygen Delivery Method: Circle system utilized Preoxygenation: Pre-oxygenation with 100% oxygen Intubation Type: IV induction Ventilation: Mask ventilation without difficulty Laryngoscope Size: Mac and 4 Grade View: Grade I Tube type: Oral Tube size: 7.5 mm Number of attempts: 1 Airway Equipment and Method: Stylet and LTA kit utilized Placement Confirmation: ETT inserted through vocal cords under direct vision,  positive ETCO2 and breath sounds checked- equal and bilateral Secured at: 22 cm Tube secured with: Tape Dental Injury: Teeth and Oropharynx as per pre-operative assessment

## 2015-04-23 NOTE — H&P (View-Only) (Signed)
New Carotid Patient  Referred by:  Burnard Bunting, MD [address] [place], [place] [address]  Reason for referral: abnormal left carotid stenosis  History of Present Illness  Seth Young is a 74 y.o. (10/05/1940) male who presents with chief complaint: left visual disturbances.  Patient notes he was previously getting "injections in his left eye."  Recently he started developing central visual field loss, which his Opth. felt might be due to carotid disease.  He was evaluated by his PCP who ordered a B carotid duplex.  Previous carotid studies demonstrated: RICA <09% stenosis, LICA >32% stenosis.  The patient has never had amaurosis fugax or monocular blindness rather he had the central vision disturbance.  The patient has never had facial drooping or hemiplegia.  The patient has never had receptive or expressive aphasia.   The patient's previous neurologic deficits have mostly resolved.  The patient's risks factors for carotid disease include: HTN, IDDM, and prior smoking  Past Medical History  Diagnosis Date  . Hypertension   . Colon polyps 10-12-11    past hx.  . Exertional angina 06/26/2014    Cath with DES to the OM, Bioflow protocol   . High cholesterol   . Pneumonia 1940's X 2  . Sleep apnea     no cpap used  . Type II diabetes mellitus dx'd 1982    nsulin started ~ 2000  . H/O hiatal hernia     no problems  . GERD (gastroesophageal reflux disease)   . Arthritis     "knees, toes, hands" (06/26/2014)  . Pseudogout     "it moves around"  . Basal cell carcinoma     left temporal area-no residual  . Ischemic chest pain 08/07/2014    Past Surgical History  Procedure Laterality Date  . Back surgery    . Nasal septum surgery  1979  . Carpal tunnel release Left 10/2009  . Knee arthroscopy Left 05/2011    10'12-left knee  . Shoulder open rotator cuff repair  10/14/2011    Procedure: ROTATOR CUFF REPAIR SHOULDER OPEN;  Surgeon: Johnn Hai, MD;  Location: [facility];   Service: Orthopedics;  Laterality: Right;  . Subacromial decompression  10/14/2011    Procedure: SUBACROMIAL DECOMPRESSION;  Surgeon: Johnn Hai, MD;  Location: [facility];  Service: Orthopedics;  Laterality: Right;  . Cardiac catheterization  02/2005    Dr. Mare Ferrari; negative.  . Coronary angioplasty with stent placement  06/26/2014    "1"  . Posterior lumbar fusion  1964  . Carpal tunnel release Right ~ 2013  . Cataract extraction w/ intraocular lens implant Left 2000's  . Cataract extraction w/ intraocular lens implant Right 2015  . Elbow surgery Bilateral ~ 2014    "for blockages; Dr. Amedeo Plenty"  . Trigger finger release Right ~ 2013-2014    "3rd & 4th digits"  . Basal cell carcinoma excision Left ~ 2012    face  . Coronary angioplasty with stent placement  06/26/2014    pLAD 50%, d LAD 60%, oD1 80%,  mD1  70%, CFX 50%, OM 2 70%,  RCA 80%, PDA 95% (<62mm), OM1 99%-0% with Bio flow stent       . Left heart catheterization with coronary angiogram N/A 06/26/2014    Procedure: LEFT HEART CATHETERIZATION WITH CORONARY ANGIOGRAM;  Surgeon: Blane Ohara, MD;  Location: [facility] CATH LAB;  Service: Cardiovascular;  Laterality: N/A;  . Left heart catheterization with coronary angiogram N/A 08/07/2014  Procedure: LEFT HEART CATHETERIZATION WITH CORONARY ANGIOGRAM;  Surgeon: Blane Ohara, MD;  Location: [facility] CATH LAB;  Service: Cardiovascular;  Laterality: N/A;    Social History   Social History  . Marital Status: Married    Spouse Name: N/A  . Number of Children: N/A  . Years of Education: N/A   Occupational History  . Not on file.   Social History Main Topics  . Smoking status: Former Smoker -- 0.50 packs/day for 20 years    Types: Cigarettes    Quit date: 10/11/1988  . Smokeless tobacco: Never Used  . Alcohol Use: 0.0 oz/week    0 Standard drinks or equivalent per week     Comment: Quit drinking in 1990  . Drug Use: No  . Sexual Activity: No   Other Topics Concern  . Not on  file   Social History Narrative    Family History  Problem Relation Age of Onset  . Diabetes Mother   . Varicose Veins Mother   . Heart attack Father 74    "massive heart attack"  . Cancer Sister   . Diabetes Sister   . Hypertension Sister   . Varicose Veins Sister   . Diabetes Brother   . Hypertension Brother     Current Outpatient Prescriptions on File Prior to Visit  Medication Sig Dispense Refill  . acetaminophen (TYLENOL) 325 MG tablet Take 650 mg by mouth at bedtime as needed (sleep).    . Aflibercept (EYLEA IO) Inject 1 each into the eye every 6 (six) weeks. LEFT eye    . aspirin 81 MG tablet Take 1 tablet (81 mg total) by mouth daily.    Marland Kitchen atorvastatin (LIPITOR) 40 MG tablet Take 1 tablet (40 mg total) by mouth daily before breakfast. 30 tablet 11  . canagliflozin (INVOKANA) 100 MG TABS tablet Take 100 mg by mouth daily.    Marland Kitchen desoximetasone (TOPICORT) 0.25 % cream Apply 1 application topically as needed (licanplantusa).    . diphenhydrAMINE (BENADRYL) 25 MG tablet Take 25 mg by mouth at bedtime as needed for sleep.    . dorzolamide-timolol (COSOPT) 22.3-6.8 MG/ML ophthalmic solution Place 1 drop into both eyes 2 (two) times daily.    . insulin glargine (LANTUS) 100 UNIT/ML injection Inject 50 Units into the skin at bedtime.     . insulin lispro (HUMALOG) 100 UNIT/ML injection Inject 0-30 Units into the skin daily as needed for high blood sugar (sliding scale). Sliding scale      . isosorbide mononitrate (IMDUR) 30 MG 24 hr tablet Take 0.5 tablets (15 mg total) by mouth daily. 15 tablet 11  . lisinopril (PRINIVIL,ZESTRIL) 10 MG tablet TAKE ONE TABLET BY MOUTH ONCE DAILY BEFORE  BREAKFAST. 90 tablet 0  . metFORMIN (GLUCOPHAGE) 1000 MG tablet TAKE ONE TABLET BY MOUTH TWICE DAILY WITH A MEAL 60 tablet 0  . metoprolol tartrate (LOPRESSOR) 25 MG tablet Take 1 tablet (25 mg total) by mouth 2 (two) times daily. 60 tablet 11  . nitroGLYCERIN (NITROSTAT) 0.4 MG SL tablet Place 1  tablet (0.4 mg total) under the tongue every 5 (five) minutes as needed for chest pain. 25 tablet 3  . Omega-3 Fatty Acids (FISH OIL PO) Take 1 capsule by mouth daily.    Marland Kitchen omeprazole (PRILOSEC) 40 MG capsule Take 40 mg by mouth 2 (two) times daily.    . prasugrel (EFFIENT) 10 MG TABS tablet Take 1 tablet (10 mg total) by mouth daily. 30 tablet 11  . pregabalin (  LYRICA) 50 MG capsule Take 50 mg by mouth every evening.    . Probiotic Product (PROBIOTIC PO) Take 1 tablet by mouth daily.    . colchicine 0.6 MG tablet Take 0.6 mg by mouth as needed (gout).    Marland Kitchen metoCLOPramide (REGLAN) 10 MG tablet Take 10 mg by mouth daily before supper.    . Multiple Vitamins-Minerals (ZINC PO) Take 1 tablet by mouth daily.     No current facility-administered medications on file prior to visit.    Allergies  Allergen Reactions  . Gabapentin     Ataxia  . Morphine And Related Itching    Itching   . Naproxen Other (See Comments)    Tremors, hallucinates     REVIEW OF SYSTEMS:  (Positives checked otherwise negative)  CARDIOVASCULAR:  []  chest pain, []  chest pressure, []  palpitations, []  shortness of breath when laying flat, []  shortness of breath with exertion,  []  pain in feet when walking, []  pain in feet when laying flat, []  history of blood clot in veins (DVT), []  history of phlebitis, []  swelling in legs, []  varicose veins  PULMONARY:  []  productive cough, []  asthma, []  wheezing  NEUROLOGIC:  []  weakness in arms or legs, []  numbness in arms or legs, []  difficulty speaking or slurred speech, []  temporary loss of vision in one eye, []  dizziness  HEMATOLOGIC:  []  bleeding problems, []  problems with blood clotting too easily  MUSCULOSKEL:  []  joint pain, []  joint swelling  GASTROINTEST:  []  vomiting blood, []  blood in stool     GENITOURINARY:  []  burning with urination, []  blood in urine  PSYCHIATRIC:  []  history of major depression  INTEGUMENTARY:  []  rashes, []  ulcers  CONSTITUTIONAL:  []   fever, []  chills  For VQI Use Only  PRE-ADM LIVING: Home  AMB STATUS: Ambulatory  CAD Sx: History of MI, but no symptoms No MI within 6 months  PRIOR CHF: None  STRESS TEST: [x]  No, [ ]  Normal, [ ]  + ischemia, [ ]  + MI, [ ]  Both   Physical Examination  Filed Vitals:   04/09/15 1321 04/09/15 1324  BP: 122/69 125/73  Pulse: 74 74  Temp: 97.1 F (36.2 C)   Height: 5' 11.5" (1.816 m)   Weight: 233 lb (105.688 kg)   SpO2: 98%    Body mass index is 32.05 kg/(m^2).  General: A&O x 3, WDWN  Head: [place]/AT  Ear/Nose/Throat: Hearing grossly intact, nares w/o erythema or drainage, oropharynx w/o Erythema/Exudate, Mallampati score: 3  Eyes: PERRLA, EOMI  Neck: Supple, no nuchal rigidity, no palpable LAD  Pulmonary: Sym exp, good air movt, CTAB, no rales, rhonchi, & wheezing  Cardiac: RRR, Nl S1, S2, no Murmurs, rubs or gallops  Vascular: Vessel Right Left  Radial Palpable Palpable  Brachial Palpable Palpable  Carotid Palpable, without bruit Palpable, without bruit  Aorta  Not palpable N/A  Femoral Palpable Palpable  Popliteal Not palpable Not palpable  PT Palpable Palpable  DP Palpable Palpable   Gastrointestinal: soft, NTND, -G/R, - HSM, - masses, - CVAT B  Musculoskeletal: M/S 5/5 throughout , Extremities without ischemic changes , BLE CVI skin changes with varicosities  Neurologic: CN 2-12 intact , Pain and light touch intact in extremities , Motor exam as listed above  Psychiatric: Judgment intact, Mood & affect appropriate for pt's clinical situation  Dermatologic: See M/S exam for extremity exam, no rashes otherwise noted  Lymph : No Cervical, Axillary, or Inguinal lymphadenopathy    Non-Invasive Vascular Imaging  L CAROTID DUPLEX (Date: 04/09/2015):   R ICA stenosis: 80-99% (near occluded)  R VA: patent and antegrade   Outside Studies/Documentation 4 pages of outside documents were reviewed including: outpatient PCP chart, outside carotid  duplex.   Medical Decision Making  Seth Young is a 74 y.o. male who presents with: sx L ICA stenosis >90%., asx R ICA stenosis <50%   Based on the patient's vascular studies and examination, I have offered the patient: L CEA. I discussed with the patient the risks, benefits, and alternatives to carotid endarterectomy.   The patient is may be a candidate for carotid artery stenting.  I discussed the procedural details of carotid endarterectomy with the patient.   The patient is aware that the risks of carotid endarterectomy include but are not limited to: bleeding, infection, stroke, myocardial infarction, death, cranial nerve injuries both temporary and permanent, neck hematoma, possible airway compromise, labile blood pressure post-operatively, cerebral hyperperfusion syndrome, and possible need for additional interventions in the future.  The patient is aware of the risks and agrees to proceed forward with the procedure.  Patient has significant cardiac history, but recently was seen by Dr. Mare Ferrari.  Will see if he clears the patient to proceed with L CEA.  I discussed in depth with the patient the nature of atherosclerosis, and emphasized the importance of maximal medical management including strict control of blood pressure, blood glucose, and lipid levels, obtaining regular exercise, antiplatelet agents, and cessation of smoking.   The patient is currently on a statin: Lipitor.                                                 The patient is currently on an anti-platelet: ASA..    The patient is aware that without maximal medical management the underlying atherosclerotic disease process will progress, limiting the benefit of any interventions.  Thank you for allowing [place] to participate in this patient's care.   Adele Barthel, MD [facility] [place] Office: [phone] Pager: [phone]  04/09/2015, 5:19 PM

## 2015-04-23 NOTE — Progress Notes (Signed)
Pt performed standing void with RN assist x 1.

## 2015-04-23 NOTE — Progress Notes (Signed)
Pt continues with midline tongue with equal lateral movement; symmetrical smile and frown.  Strong bilat hand grips and plantar/dorsiflexion.

## 2015-04-24 ENCOUNTER — Encounter (HOSPITAL_COMMUNITY): Payer: Self-pay | Admitting: Vascular Surgery

## 2015-04-24 ENCOUNTER — Telehealth: Payer: Self-pay | Admitting: Vascular Surgery

## 2015-04-24 LAB — BASIC METABOLIC PANEL
ANION GAP: 10 (ref 5–15)
BUN: 13 mg/dL (ref 6–20)
CALCIUM: 8.6 mg/dL — AB (ref 8.9–10.3)
CO2: 20 mmol/L — AB (ref 22–32)
CREATININE: 1.16 mg/dL (ref 0.61–1.24)
Chloride: 103 mmol/L (ref 101–111)
Glucose, Bld: 186 mg/dL — ABNORMAL HIGH (ref 65–99)
Potassium: 4.5 mmol/L (ref 3.5–5.1)
SODIUM: 133 mmol/L — AB (ref 135–145)

## 2015-04-24 LAB — CBC
HCT: 43.9 % (ref 39.0–52.0)
Hemoglobin: 13.5 g/dL (ref 13.0–17.0)
MCH: 25.1 pg — ABNORMAL LOW (ref 26.0–34.0)
MCHC: 30.8 g/dL (ref 30.0–36.0)
MCV: 81.6 fL (ref 78.0–100.0)
PLATELETS: 181 10*3/uL (ref 150–400)
RBC: 5.38 MIL/uL (ref 4.22–5.81)
RDW: 17.2 % — AB (ref 11.5–15.5)
WBC: 7.5 10*3/uL (ref 4.0–10.5)

## 2015-04-24 LAB — GLUCOSE, CAPILLARY: GLUCOSE-CAPILLARY: 165 mg/dL — AB (ref 65–99)

## 2015-04-24 LAB — HEMOGLOBIN A1C
HEMOGLOBIN A1C: 8.5 % — AB (ref 4.8–5.6)
MEAN PLASMA GLUCOSE: 197 mg/dL

## 2015-04-24 MED ORDER — OXYCODONE-ACETAMINOPHEN 5-325 MG PO TABS: 1.0000 | ORAL_TABLET | Freq: Four times a day (QID) | ORAL | Status: DC | PRN

## 2015-04-24 MED ORDER — PRASUGREL HCL 10 MG PO TABS
10.0000 mg | ORAL_TABLET | Freq: Every day | ORAL | Status: DC
  Administered 2015-04-24: 10 mg via ORAL
  Filled 2015-04-24: qty 1

## 2015-04-24 NOTE — Telephone Encounter (Addendum)
-----   Message from Gabriel Earing, [place] sent at 04/23/2015  3:31 PM EDT ----- S/p left CEA 04/23/15.  F/u with Dr. Bridgett Larsson in 2 weeks.  Thanks, Samantha  notified patient of post op appt. on 05-09-15 at 1pm

## 2015-04-24 NOTE — Discharge Summary (Signed)
[facility] Discharge Summary   Patient ID:  Seth Young MRN: [id number] DOB/AGE: January 20, 1941 74 y.o.  Admit date: 04/23/2015 Discharge date: 04/24/2015 Date of Surgery: 04/23/2015 Surgeon: Surgeon(s): Conrad Casselman, MD  Admission Diagnosis: Left carotid artery stenosis I65.22  Discharge Diagnoses:  Left carotid artery stenosis I65.22  Secondary Diagnoses: Past Medical History  Diagnosis Date  . Hypertension   . Colon polyps 10-12-11    past hx.  . Exertional angina 06/26/2014    Cath with DES to the OM, Bioflow protocol   . High cholesterol   . Pneumonia 1940's X 2  . Type II diabetes mellitus dx'd 1982    nsulin started ~ 2000  . H/O hiatal hernia     no problems  . GERD (gastroesophageal reflux disease)   . Arthritis     "knees, toes, hands" (06/26/2014)  . Pseudogout     "it moves around"  . Ischemic chest pain 08/07/2014  . Sleep apnea     no cpap used  . Basal cell carcinoma     left temporal area-no residual  . Peripheral neuropathy   . Occasional tremors     Bilateral hands    Procedure(s): ENDARTERECTOMY CAROTID PATCH ANGIOPLASTY USING 1CM X 6CM XENOSURE BIOLOGIC PATCH  Discharged Condition: good  HPI: Seth Young is a 74 y.o. (Nov 08, 1940) male who presents with chief complaint: left visual disturbances. Patient notes he was previously getting "injections in his left eye." Recently he started developing central visual field loss, which his Opth. felt might be due to carotid disease. He was evaluated by his PCP who ordered a B carotid duplex. Previous carotid studies demonstrated: RICA <15% stenosis, LICA >17% stenosis. The patient has never had amaurosis fugax or monocular blindness rather he had the central vision disturbance. The patient has never had facial drooping or hemiplegia. The patient has never had receptive or expressive aphasia. The patient's previous neurologic deficits have mostly resolved. The patient's risks  factors for carotid disease include: HTN, IDDM, and prior smoking   Hospital Course:  Seth Young is a 74 y.o. male is S/P Procedure(s): ENDARTERECTOMY CAROTID PATCH ANGIOPLASTY USING 1CM X 6CM [place] POD#1 Uneventful stay over night. No hematoma. Neuro intact. Midline tongue. Tolerating diet.  Discharge in stable condition.    Significant Diagnostic Studies: CBC Lab Results  Component Value Date   WBC 7.5 04/24/2015   HGB 13.5 04/24/2015   HCT 43.9 04/24/2015   MCV 81.6 04/24/2015   PLT 181 04/24/2015    BMET    Component Value Date/Time   NA 133* 04/24/2015 0325   NA 136 08/05/2014 1121   K 4.5 04/24/2015 0325   CL 103 04/24/2015 0325   CO2 20* 04/24/2015 0325   GLUCOSE 186* 04/24/2015 0325   GLUCOSE 118* 08/05/2014 1121   BUN 13 04/24/2015 0325   BUN 28* 08/05/2014 1121   CREATININE 1.16 04/24/2015 0325   CALCIUM 8.6* 04/24/2015 0325   GFRNONAA >60 04/24/2015 0325   GFRAA >60 04/24/2015 0325   COAG Lab Results  Component Value Date   INR 1.25 04/18/2015   INR 1.1 08/05/2014   INR 1.1* 06/25/2014     Disposition:  Discharge to :Home Discharge Instructions    Call MD for:  redness, tenderness, or signs of infection (pain, swelling, bleeding, redness, odor or green/yellow discharge around incision site)    Complete by:  As directed      Call MD for:  severe or increased  pain, loss or decreased feeling  in affected limb(s)    Complete by:  As directed      Call MD for:  temperature >100.5    Complete by:  As directed      Discharge instructions    Complete by:  As directed   You may shower this evening, restarted the Effient today.  Walk for exercise slowly increase your tolerance.     Driving Restrictions    Complete by:  As directed   No driving for 1 weeks     Increase activity slowly    Complete by:  As directed   Walk with assistance use walker or cane as needed     Lifting restrictions    Complete by:  As directed   No  heavy lifting for 6 weeks.  About 5-10 lbs.     Resume previous diet    Complete by:  As directed             Medication List    STOP taking these medications        clopidogrel 75 MG tablet  Commonly known as:  PLAVIX      TAKE these medications        acetaminophen 325 MG tablet  Commonly known as:  TYLENOL  Take 650 mg by mouth at bedtime as needed (sleep).     aspirin 81 MG tablet  Take 1 tablet (81 mg total) by mouth daily.     atorvastatin 40 MG tablet  Commonly known as:  LIPITOR  Take 1 tablet (40 mg total) by mouth daily before breakfast.     colchicine 0.6 MG tablet  Take 0.6 mg by mouth as needed (gout).     diphenhydrAMINE 25 MG tablet  Commonly known as:  BENADRYL  Take 25 mg by mouth at bedtime as needed for sleep.     dorzolamide-timolol 22.3-6.8 MG/ML ophthalmic solution  Commonly known as:  COSOPT  Place 1 drop into both eyes 2 (two) times daily.     FISH OIL PO  Take 1 capsule by mouth daily.     insulin glargine 100 UNIT/ML injection  Commonly known as:  LANTUS  Inject 56 Units into the skin at bedtime.     insulin lispro 100 UNIT/ML injection  Commonly known as:  HUMALOG  Inject 0-30 Units into the skin daily as needed for high blood sugar (sliding scale). Sliding scale     INVOKANA 100 MG Tabs tablet  Generic drug:  canagliflozin  Take 100 mg by mouth daily.     isosorbide mononitrate 30 MG 24 hr tablet  Commonly known as:  IMDUR  Take 0.5 tablets (15 mg total) by mouth daily.     lisinopril 10 MG tablet  Commonly known as:  PRINIVIL,ZESTRIL  TAKE ONE TABLET BY MOUTH ONCE DAILY BEFORE  BREAKFAST.     LYRICA 100 MG capsule  Generic drug:  pregabalin  Take 100 mg by mouth every other day.     metFORMIN 1000 MG tablet  Commonly known as:  GLUCOPHAGE  TAKE ONE TABLET BY MOUTH TWICE DAILY WITH A MEAL     metoprolol tartrate 25 MG tablet  Commonly known as:  LOPRESSOR  Take 1 tablet (25 mg total) by mouth 2 (two) times daily.      nitroGLYCERIN 0.4 MG SL tablet  Commonly known as:  NITROSTAT  Place 1 tablet (0.4 mg total) under the tongue every 5 (five) minutes as needed for chest pain.  omeprazole 40 MG capsule  Commonly known as:  PRILOSEC  Take 40 mg by mouth 2 (two) times daily.     oxyCODONE-acetaminophen 5-325 MG per tablet  Commonly known as:  PERCOCET/ROXICET  Take 1 tablet by mouth every 6 (six) hours as needed for moderate pain.     prasugrel 10 MG Tabs tablet  Commonly known as:  EFFIENT  Take 1 tablet (10 mg total) by mouth daily.     PROBIOTIC PO  Take 1 tablet by mouth daily.     ZINC PO  Take 1 tablet by mouth daily.       Verbal and written Discharge instructions given to the patient. Wound care per Discharge AVS     Follow-up Information    Follow up with Adele Barthel, MD In 2 weeks.   Specialties:  Vascular Surgery, Cardiology   Why:  Office will call you to arrange your appt (sent)   Contact information:   [place] [address] [phone]       Signed: Laurence Slate [facility] 04/24/2015, 8:16 PM   Addendum  I have independently interviewed and examined the patient, and I agree with the physician assistant's discharge summary.  This patient had an uneventful left carotid endarterectomy.  He had a stable post-operative period with intact neurologic function, no neck hematoma, and intact swallow function.  He will follow up in the office in 2 weeks.  Adele Barthel, MD [facility] [place] Office: [phone] Pager: [phone]  04/25/2015, 1:14 AM   --- For VQI Registry use --- Instructions: Press F2 to tab through selections.  Delete question if not applicable.   Modified Rankin score at D/C (0-6): Rankin Score=0  IV medication needed for:  1. Hypertension: No 2. Hypotension: No  Post-op Complications: No  1. Post-op CVA or TIA: No  If yes: Event classification (right eye, left eye, right cortical, left cortical,  verterobasilar, other):   If yes: Timing of event (intra-op, <6 hrs post-op, >=6 hrs post-op, unknown):   2. CN injury: No  If yes: CN  injuried   3. Myocardial infarction: No  If yes: Dx by (EKG or clinical, Troponin):   4.  CHF: No  5.  Dysrhythmia (new): No  6. Wound infection: No  7. Reperfusion symptoms: No  8. Return to OR: No  If yes: return to OR for (bleeding, neurologic, other CEA incision, other):   Discharge medications: Statin use:  Yes ASA use:  Yes Beta blocker use:  Yes ACE-Inhibitor use:  No  for medical reason   P2Y12 Antagonist use: [ ]  None, [ ]  Plavix, [x ] Plasugrel, [ ]  Ticlopinine, [ ]  Ticagrelor, [ ]  Other, [ ]  No for medical reason, [ ]  Non-compliant, [ ]  Not-indicated Anti-coagulant use:  [x ] None, [ ]  Warfarin, [ ]  Rivaroxaban, [ ]  Dabigatran, [ ]  Other, [ ]  No for medical reason, [ ]  Non-compliant, [ ]  Not-indicated

## 2015-04-24 NOTE — Progress Notes (Addendum)
D/C instructions reviewed with patient and spouse.  Printed instructions provided as well as verbal instructions for s/s of stroke.  Pt and spouse verbalized their understanding of all info given.  Printed script for pain med given. Pt to be transferred out via wheelchair in custody of spouse.  Pt to home with full dentures in mouth, glasses on, phone in possession.

## 2015-04-24 NOTE — Progress Notes (Addendum)
[facility] [place]  Subjective  - Doing well ambulating taking PO well and voided   Objective 112/53 64 98.4 F (36.9 C) (Oral) 13 99%  Intake/Output Summary (Last 24 hours) at 04/24/15 0734 Last data filed at 04/24/15 0600  Gross per 24 hour  Intake 4262.67 ml  Output   2600 ml  Net 1662.67 ml    Smile is symmetric, no tongue deviation  Grip 5/5 bilateral UE Incision soft without hematoma Heart RRR Lungs non labored breathing  Assessment/Planning: POD #Right CEA  Discharge home in stable condition Restart Effient today stop plavix F/U in 2 weeks  Laurence Slate [facility] 04/24/2015 7:34 AM --  Laboratory Lab Results:  Recent Labs  04/23/15 1621 04/24/15 0325  WBC 8.4 7.5  HGB 13.6 13.5  HCT 43.9 43.9  PLT 171 181   BMET  Recent Labs  04/23/15 1621 04/24/15 0325  NA  --  133*  K  --  4.5  CL  --  103  CO2  --  20*  GLUCOSE  --  186*  BUN  --  13  CREATININE 1.14 1.16  CALCIUM  --  8.6*    COAG Lab Results  Component Value Date   INR 1.25 04/18/2015   INR 1.1 08/05/2014   INR 1.1* 06/25/2014   No results found for: PTT   Addendum  I have independently interviewed and examined the patient, and I agree with the physician assistant's findings.  Ok to D/C.  No hematoma.  Neuro intact.  Midline tongue.  Tolerating diet  Adele Barthel, MD [facility] [place] Office: [phone] Pager: [phone]  04/24/2015, 9:00 AM

## 2015-04-24 NOTE — Progress Notes (Signed)
Pt up in chair, wife at bedside.  Pt with symmetrical smile and frown.  Tongue midline with equal lateral movement right and left.  Pt states left neck is "uncomfortable" and does not need anything for pain at this time.

## 2015-04-24 NOTE — Care Management Note (Signed)
Case Management Note  Patient Details  Name: TRAVION KE MRN: [id number] Date of Birth: 23-Feb-1941  Subjective/Objective:                 Admitted with L carotid stenosis, s/p L carotid endarterectomy 04/23/15   Action/Plan:  Return to home when medically stable. CM to f/u with d/c disposition. Expected Discharge Date:  04/24/15               Expected Discharge Plan:  Home/Self Care  In-House Referral:     Discharge planning Services  CM Consult  Post Acute Care Choice:    Choice offered to:     DME Arranged:    DME Agency:     HH Arranged:    HH Agency:     Status of Service:  Completed, signed off  Medicare Important Message Given:    Date Medicare IM Given:    Medicare IM give by:    Date Additional Medicare IM Given:    Additional Medicare Important Message give by:     If discussed at [place] of Stay Meetings, dates discussed:    Additional Comments:  Sharin Mons, RN 04/24/2015, 10:48 AM

## 2015-05-07 ENCOUNTER — Encounter: Payer: Self-pay | Admitting: Vascular Surgery

## 2015-05-09 ENCOUNTER — Encounter: Payer: Self-pay | Admitting: Vascular Surgery

## 2015-05-09 ENCOUNTER — Ambulatory Visit (INDEPENDENT_AMBULATORY_CARE_PROVIDER_SITE_OTHER): Payer: Self-pay | Admitting: Vascular Surgery

## 2015-05-09 VITALS — BP 135/65 | HR 78 | Temp 98.1°F | Resp 16 | Ht 71.5 in | Wt 236.0 lb

## 2015-05-09 DIAGNOSIS — I6522 Occlusion and stenosis of left carotid artery: Secondary | ICD-10-CM

## 2015-05-09 NOTE — Addendum Note (Signed)
Addended by: Dorthula Rue L on: 05/09/2015 03:11 PM   Modules accepted: Orders

## 2015-05-09 NOTE — Progress Notes (Signed)
POST OPERATIVE OFFICE NOTE    CC:  F/u for surgery  HPI:  This is a 74 y.o. male who is s/p Left CEA.  He reports he is back to his base line activity.  He has had no fever, chills or swallowing difficulties.  No weakness or vision changes since surgery.  Allergies  Allergen Reactions  . Gabapentin     Ataxia  . Morphine And Related Itching    Itching   . Naproxen Other (See Comments)    Tremors, hallucinates     Current Outpatient Prescriptions  Medication Sig Dispense Refill  . acetaminophen (TYLENOL) 325 MG tablet Take 650 mg by mouth at bedtime as needed (sleep).    Marland Kitchen aspirin 81 MG tablet Take 1 tablet (81 mg total) by mouth daily.    Marland Kitchen atorvastatin (LIPITOR) 40 MG tablet Take 1 tablet (40 mg total) by mouth daily before breakfast. 30 tablet 11  . canagliflozin (INVOKANA) 100 MG TABS tablet Take 100 mg by mouth daily.    . colchicine 0.6 MG tablet Take 0.6 mg by mouth as needed (gout).    Marland Kitchen diphenhydrAMINE (BENADRYL) 25 MG tablet Take 25 mg by mouth at bedtime as needed for sleep.    . dorzolamide-timolol (COSOPT) 22.3-6.8 MG/ML ophthalmic solution Place 1 drop into both eyes 2 (two) times daily.    . insulin glargine (LANTUS) 100 UNIT/ML injection Inject 56 Units into the skin at bedtime.     . insulin lispro (HUMALOG) 100 UNIT/ML injection Inject 0-30 Units into the skin daily as needed for high blood sugar (sliding scale). Sliding scale      . isosorbide mononitrate (IMDUR) 30 MG 24 hr tablet Take 0.5 tablets (15 mg total) by mouth daily. 15 tablet 11  . lisinopril (PRINIVIL,ZESTRIL) 10 MG tablet TAKE ONE TABLET BY MOUTH ONCE DAILY BEFORE  BREAKFAST. 90 tablet 0  . LYRICA 100 MG capsule Take 100 mg by mouth every other day.     . metFORMIN (GLUCOPHAGE) 1000 MG tablet TAKE ONE TABLET BY MOUTH TWICE DAILY WITH A MEAL 60 tablet 0  . metoprolol tartrate (LOPRESSOR) 25 MG tablet Take 1 tablet (25 mg total) by mouth 2 (two) times daily. 60 tablet 11  . nitroGLYCERIN  (NITROSTAT) 0.4 MG SL tablet Place 1 tablet (0.4 mg total) under the tongue every 5 (five) minutes as needed for chest pain. 25 tablet 3  . Omega-3 Fatty Acids (FISH OIL PO) Take 1 capsule by mouth daily.    Marland Kitchen omeprazole (PRILOSEC) 40 MG capsule Take 40 mg by mouth 2 (two) times daily.    . prasugrel (EFFIENT) 10 MG TABS tablet Take 1 tablet (10 mg total) by mouth daily. 30 tablet 11  . Probiotic Product (PROBIOTIC PO) Take 1 tablet by mouth daily.    . Multiple Vitamins-Minerals (ZINC PO) Take 1 tablet by mouth daily.    Marland Kitchen oxyCODONE-acetaminophen (PERCOCET/ROXICET) 5-325 MG per tablet Take 1 tablet by mouth every 6 (six) hours as needed for moderate pain. (Patient not taking: Reported on 05/09/2015) 15 tablet 0   No current facility-administered medications for this visit.     ROS:  See HPI  Physical Exam:  Filed Vitals:   05/09/15 1301  BP: 135/65  Pulse: 78  Temp: 98.1 F (36.7 C)  Resp: 16    Incision:  Well healed Extremities:  Grip 5/5 equal bil, no tongue deviation and smile is symmetric Neuro: speech clear  Assessment/Plan:  This is a 74 y.o. male who is  s/p: Left CEA  He will f/u in 9 months for repeat carotid duplex bilaterally.  The right ICA was < 50% stenosis pre-op.   Theda Sers, EMMA MAUREEN PA-C [facility]   Clinic MD:  Pt seen and examined with Dr. Bridgett Larsson  Addendum  I have independently interviewed and examined the patient, and I agree with the physician assistant's findings.    Adele Barthel, MD [facility] [place] Office: [phone] Pager: [phone]  05/09/2015, 1:46 PM

## 2015-05-19 ENCOUNTER — Encounter: Payer: Self-pay | Admitting: Cardiology

## 2015-05-19 ENCOUNTER — Ambulatory Visit (INDEPENDENT_AMBULATORY_CARE_PROVIDER_SITE_OTHER): Payer: Medicare Other | Admitting: Cardiology

## 2015-05-19 VITALS — BP 128/70 | HR 81 | Ht 71.0 in | Wt 237.0 lb

## 2015-05-19 DIAGNOSIS — R079 Chest pain, unspecified: Secondary | ICD-10-CM | POA: Diagnosis not present

## 2015-05-19 DIAGNOSIS — I10 Essential (primary) hypertension: Secondary | ICD-10-CM | POA: Diagnosis not present

## 2015-05-19 DIAGNOSIS — E7800 Pure hypercholesterolemia, unspecified: Secondary | ICD-10-CM

## 2015-05-19 DIAGNOSIS — E0842 Diabetes mellitus due to underlying condition with diabetic polyneuropathy: Secondary | ICD-10-CM

## 2015-05-19 NOTE — Patient Instructions (Signed)
Medication Instructions:  Your physician recommends that you continue on your current medications as directed. Please refer to the Current Medication list given to you today.  Labwork: none  Testing/Procedures: none  Follow-Up: Your physician wants you to follow-up in: 4 month ov with Dr Dawna Part will receive a reminder letter in the mail two months in advance. If you don't receive a letter, please call our office to schedule the follow-up appointment.

## 2015-05-19 NOTE — Progress Notes (Signed)
Cardiology Office Note   Date:  05/19/2015   ID:  Favor, Hackler 07/12/1941, MRN [id number]  PCP:  Geoffery Lyons, MD  Cardiologist: Darlin Coco MD  No chief complaint on file.     History of Present Illness: Seth Young is a 74 y.o. male who presents for scheduled four-month follow-up visit   HPI: The patient is a 74 year old male with a prior history of insulin-dependent diabetes mellitus, hypertension and hyperlipidemia and CAD, who is seen for a scheduled follow-up office visit.  He was admitted to [facility] on 06/26/2014 to undergo selective left heart catheterization in the setting of recent chest pain and an abnormal nuclear stress test. He was found to have diffuse severe diabetic disease, with 50% proximal LAD stenosis, 25-30% mid LAD stenosis, and 50-60% diffuse apical LAD stenosis. The first diagonal branch is severely diseased in diffuse fashion with 80% ostial and 70% mid vessel stenosis. The left circumflex is diffusely diseased. It is a large caliber vessel. The proximal circumflex has 50% stenosis. The mid circumflex has scattered 30% stenoses. The first obtuse marginal was observed to be subtotally occluded with 99% stenosis. The second obtuse marginal is very small with 70% ostial stenosis. The left posterior lateral branches are large in caliber without significant disease. The right coronary artery is dominant. The vessel has very severe distal stenosis in a diabetic pattern with diffuse disease noted distally. The proximal vessel has diffuse irregularity. The entire mid vessel has 80% stenosis. The PDA branch has 95% stenosis and it is extremely small in caliber, visually less than 1 mm. Left ventriculography revealed normal LV function with EF of 55%. The culprit vessel was felt to be the OM1. This was successfully treated with PCI utilizing a drug-eluting stent. He tolerated the procedure well and was placed on dual antiplatelet therapy with  aspirin plus Effient. He was also placed on a beta blocker and his Lipitor was increased to 40 mg daily. Since last visit he has been doing well.He has had rare episodes of chest discomfort which have not required sublingual nitroglycerin. He thinks that the discomfort may be related to gas from GI issues. He remains on dual antiplatelet therapy. He has not had any evidence of GI bleeding clinically. He has a history of peripheral neuropathy which is quite painful at times. He takes a combination of Lyrica, Benadryl, and Tylenol for pain relief. Since last visit the patient underwent successful left carotid endarterectomy on 04/23/15 by Dr. Bridgett Larsson. The patient exercises in the summer by swimming.  In the winter he uses an exercise bicycle.  He also has a treadmill at home but does not use it much because of problems with his knees. The patient has not had to take any sublingual nitroglycerin since last visit.  Past Medical History  Diagnosis Date  . Hypertension   . Colon polyps 10-12-11    past hx.  . Exertional angina ([place]) 06/26/2014    Cath with DES to the OM, Bioflow protocol   . High cholesterol   . Pneumonia 1940's X 2  . Type II diabetes mellitus ([place]) dx'd 1982    nsulin started ~ 2000  . H/O hiatal hernia     no problems  . GERD (gastroesophageal reflux disease)   . Arthritis     "knees, toes, hands" (06/26/2014)  . Pseudogout     "it moves around"  . Ischemic chest pain ([place]) 08/07/2014  . Sleep apnea  no cpap used  . Basal cell carcinoma     left temporal area-no residual  . Peripheral neuropathy ([place])   . Occasional tremors     Bilateral hands    Past Surgical History  Procedure Laterality Date  . Back surgery    . Nasal septum surgery  1979  . Carpal tunnel release Left 10/2009  . Knee arthroscopy Left 05/2011    10'12-left knee  . Shoulder open rotator cuff repair  10/14/2011    Procedure: ROTATOR CUFF REPAIR SHOULDER OPEN;  Surgeon: Johnn Hai, MD;   Location: [facility];  Service: Orthopedics;  Laterality: Right;  . Subacromial decompression  10/14/2011    Procedure: SUBACROMIAL DECOMPRESSION;  Surgeon: Johnn Hai, MD;  Location: [facility];  Service: Orthopedics;  Laterality: Right;  . Cardiac catheterization  02/2005    Dr. Mare Ferrari; negative.  . Coronary angioplasty with stent placement  06/26/2014    "1"  . Posterior lumbar fusion  1964  . Carpal tunnel release Right ~ 2013  . Cataract extraction w/ intraocular lens implant Left 2000's  . Cataract extraction w/ intraocular lens implant Right 2015  . Elbow surgery Bilateral ~ 2014    "for blockages; Dr. Amedeo Plenty"  . Trigger finger release Right ~ 2013-2014    "3rd & 4th digits"  . Basal cell carcinoma excision Left ~ 2012    face  . Coronary angioplasty with stent placement  06/26/2014    pLAD 50%, d LAD 60%, oD1 80%,  mD1  70%, CFX 50%, OM 2 70%,  RCA 80%, PDA 95% (<35mm), OM1 99%-0% with Bio flow stent       . Left heart catheterization with coronary angiogram N/A 06/26/2014    Procedure: LEFT HEART CATHETERIZATION WITH CORONARY ANGIOGRAM;  Surgeon: Blane Ohara, MD;  Location: [facility] CATH LAB;  Service: Cardiovascular;  Laterality: N/A;  . Left heart catheterization with coronary angiogram N/A 08/07/2014    Procedure: LEFT HEART CATHETERIZATION WITH CORONARY ANGIOGRAM;  Surgeon: Blane Ohara, MD;  Location: [facility] C[facility] LAB;  Service: Cardiovascular;  Laterality: N/A;  . Colonoscopy w/ polypectomy    . Endarterectomy Left 04/23/2015    Procedure: ENDARTERECTOMY CAROTID;  Surgeon: Conrad [place], MD;  Location: [place];  Service: Vascular;  Laterality: Left;  . Patch angioplasty Left 04/23/2015    Procedure: PATCH ANGIOPLASTY USING 1CM X 6CM XENOSURE BIOLOGIC PATCH;  Surgeon: Conrad [place], MD;  Location: [place];  Service: Vascular;  Laterality: Left;     Current Outpatient Prescriptions  Medication Sig Dispense Refill  . acetaminophen (TYLENOL) 325 MG tablet Take 650 mg by mouth at bedtime  as needed (sleep).    Marland Kitchen aspirin 81 MG tablet Take 1 tablet (81 mg total) by mouth daily.    Marland Kitchen atorvastatin (LIPITOR) 40 MG tablet Take 1 tablet (40 mg total) by mouth daily before breakfast. 30 tablet 11  . canagliflozin (INVOKANA) 100 MG TABS tablet Take 100 mg by mouth daily.    . colchicine 0.6 MG tablet Take 0.6 mg by mouth as needed (gout).    Marland Kitchen diphenhydrAMINE (BENADRYL) 25 MG tablet Take 25 mg by mouth at bedtime as needed for sleep.    . dorzolamide-timolol (COSOPT) 22.3-6.8 MG/ML ophthalmic solution Place 1 drop into both eyes 2 (two) times daily.    . insulin glargine (LANTUS) 100 UNIT/ML injection Inject 56 Units into the skin at bedtime.     . insulin lispro (HUMALOG) 100 UNIT/ML injection Inject 0-30 Units into the  skin daily as needed for high blood sugar (sliding scale). Sliding scale      . isosorbide mononitrate (IMDUR) 30 MG 24 hr tablet Take 0.5 tablets (15 mg total) by mouth daily. 15 tablet 11  . lisinopril (PRINIVIL,ZESTRIL) 10 MG tablet TAKE ONE TABLET BY MOUTH ONCE DAILY BEFORE  BREAKFAST. 90 tablet 0  . LYRICA 100 MG capsule Take 100 mg by mouth every other day.     . metFORMIN (GLUCOPHAGE) 1000 MG tablet TAKE ONE TABLET BY MOUTH TWICE DAILY WITH A MEAL 60 tablet 0  . metoprolol tartrate (LOPRESSOR) 25 MG tablet Take 1 tablet (25 mg total) by mouth 2 (two) times daily. 60 tablet 11  . Multiple Vitamins-Minerals (ZINC PO) Take 1 tablet by mouth daily.    . nitroGLYCERIN (NITROSTAT) 0.4 MG SL tablet Place 1 tablet (0.4 mg total) under the tongue every 5 (five) minutes as needed for chest pain. 25 tablet 3  . Omega-3 Fatty Acids (FISH OIL PO) Take 1 capsule by mouth daily.    Marland Kitchen omeprazole (PRILOSEC) 40 MG capsule Take 40 mg by mouth 2 (two) times daily.    . prasugrel (EFFIENT) 10 MG TABS tablet Take 10 mg by mouth daily.    . Probiotic Product (PROBIOTIC PO) Take 1 tablet by mouth daily.     No current facility-administered medications for this visit.    Allergies:    Gabapentin; Morphine and related; and Naproxen    Social History:  The patient  reports that he quit smoking about 26 years ago. His smoking use included Cigarettes. He has a 10 pack-year smoking history. He has never used smokeless tobacco. He reports that he drinks alcohol. He reports that he does not use illicit drugs.   Family History:  The patient's family history includes Cancer in his sister; Diabetes in his brother, mother, and sister; Heart attack (age of onset: 89) in his father; Hypertension in his brother and sister; Varicose Veins in his mother and sister.    ROS:  Please see the history of present illness.   Otherwise, review of systems are positive for none.   All other systems are reviewed and negative.    PHYSICAL EXAM: VS:  BP 128/70 mmHg  Pulse 81  Ht 5\' 11"  (1.803 m)  Wt 237 lb (107.502 kg)  BMI 33.07 kg/m2 , BMI Body mass index is 33.07 kg/(m^2). GEN: Well nourished, well developed, in no acute distress HEENT: normal Neck: no JVD, carotid bruits, or masses Cardiac: RRR; no murmurs, rubs, or gallops,no edema  Respiratory:  clear to auscultation bilaterally, normal work of breathing GI: soft, nontender, nondistended, + BS MS: no deformity or atrophy Skin: warm and dry, no rash Neuro:  Strength and sensation are intact Psych: euthymic mood, full affect   EKG:  EKG is ordered today. The ekg ordered today demonstrates normal sinus rhythm.  Within normal limits.   Recent Labs: 04/18/2015: ALT 27 04/24/2015: BUN 13; Creatinine, Ser 1.16; Hemoglobin 13.5; Platelets 181; Potassium 4.5; Sodium 133*    Lipid Panel No results found for: CHOL, TRIG, HDL, CHOLHDL, VLDL, LDLCALC, LDLDIRECT    Wt Readings from Last 3 Encounters:  05/19/15 237 lb (107.502 kg)  05/09/15 236 lb (107.049 kg)  04/23/15 239 lb 6.7 oz (108.6 kg)         ASSESSMENT AND PLAN:  1. CAD: Pt with CAD with diffuse severe diabetic disease. Status post PCI plus drug-eluting stenting to the OM1  vessel. He has had no recurrent anginal  symptoms. Continue dual antiplatelet therapy with aspirin plus Effient as well as Metroprolol and Lipitor. Given drug-eluting stent, he will need to continue dual antiplatelet therapy for a minimum of 12 months. It was noted in his cath report that he has very poor targets for grafting, but he could be considered for surgical referral down the road if he develops proximal LAD stenosis (now at 50%). We will continue to follow him on a routine basis.  2. Hypertension: Well-controlled . Continue isosorbide mononitrate and lisinopril. 3.  Carotid artery disease.  Patient underwent left carotid endarterectomy 04/23/15.  No TIA symptoms. 4. Hyperlipidemia: Continue statin therapy with Lipitor. His PCP will follow this. Goal LDL <70. He is on atorvastatin  5. Insulin-dependent diabetes mellitus: Followed and managed by PCP.  Most recent hemoglobin A1c was 7.0 Current medicines are reviewed at length with the patient today.  The patient does not have concerns regarding medicines.  The following changes have been made:  no change  Labs/ tests ordered today include:   Orders Placed This Encounter  Procedures  . EKG 12-Lead    Disposition: The patient is to continue current medication.  Encouraged him to continue to watch diet carefully and encouraged weight loss.  Continue regular aerobic exercise.  He will return in 4 months for a follow-up office visit with Dr. Marlou Porch.  Berna Spare MD 05/19/2015 8:26 AM    [place] [address], [place], [place]  [address] Phone: [phone]; Fax: [phone]

## 2015-06-18 ENCOUNTER — Other Ambulatory Visit: Payer: Self-pay | Admitting: Physician Assistant

## 2015-06-25 ENCOUNTER — Other Ambulatory Visit: Payer: Self-pay | Admitting: Cardiology

## 2015-06-26 ENCOUNTER — Telehealth: Payer: Self-pay | Admitting: Cardiology

## 2015-06-26 NOTE — Telephone Encounter (Signed)
New message     Talk to the nurse about "heartburn" like symptoms.  He has noticed this only at night when he gets up to go to the bathroom.  Pt states he has had acid reflux problems in the past and he has a stent.  Please advise

## 2015-06-27 ENCOUNTER — Other Ambulatory Visit: Payer: Self-pay | Admitting: Cardiovascular Disease

## 2015-06-27 NOTE — Telephone Encounter (Signed)
Spoke with patient yesterday morning regarding chest discomfort  He stated that for about the past week or more when he wakes up between 3-5 am to go to the restroom he has a discomfort in his sternum, resolves within 15 minutes Has been taking his Prilosec twice a day and uses additional antacids at bedtime He has had NTG once since last ov but does not feel that this discomfort has been bad enough for NTG Stated he was just concerned with his history  Discussed with  Dr. Mare Ferrari and he reviewed patients chart. Per  Dr. Mare Ferrari will increase his Imdur to 30 mg daily  Advised patient and is to call back if no improvement

## 2015-07-18 ENCOUNTER — Ambulatory Visit (HOSPITAL_COMMUNITY)
Admission: RE | Admit: 2015-07-18 | Discharge: 2015-07-18 | Disposition: A | Discharge status: Home / Self Care | Payer: Medicare Other | Source: Ambulatory Visit | Attending: Vascular Surgery | Admitting: Vascular Surgery

## 2015-07-18 ENCOUNTER — Telehealth: Payer: Self-pay

## 2015-07-18 ENCOUNTER — Encounter: Payer: Self-pay | Admitting: Vascular Surgery

## 2015-07-18 ENCOUNTER — Ambulatory Visit (INDEPENDENT_AMBULATORY_CARE_PROVIDER_SITE_OTHER): Payer: Medicare Other | Admitting: Vascular Surgery

## 2015-07-18 VITALS — BP 131/76 | HR 78 | Temp 98.0°F | Resp 16 | Ht 71.5 in | Wt 238.0 lb

## 2015-07-18 DIAGNOSIS — G453 Amaurosis fugax: Secondary | ICD-10-CM | POA: Diagnosis not present

## 2015-07-18 DIAGNOSIS — Z9889 Other specified postprocedural states: Secondary | ICD-10-CM

## 2015-07-18 DIAGNOSIS — G459 Transient cerebral ischemic attack, unspecified: Secondary | ICD-10-CM

## 2015-07-18 NOTE — Telephone Encounter (Signed)
Phone call from pt.  Reported episode of (L) eye flickering and vision through left eye appearing a little darker than the right eye.  Stated the episode occurred in the morning, when he first woke up, and lasted throughout the day.   At 4:45 PM 12/8, reported that the vision still appeared about the same in the left eye.  Denied any other neurological complaints; denied any speech difficulties, balance problems, unilateral weakness, disorientation or any other problems. Discussed pt's symptoms with Dr. Bridgett Larsson; recommended to schedule pt. for left Carotid duplex today.  Notified pt; gave pt. appt.fFor 1:30 PM.  Agreed with plan.

## 2015-07-18 NOTE — Progress Notes (Signed)
Postoperative Visit   History of Present Illness  Seth Young is a 74 y.o. male who presents for postoperative follow-up for: L CEA (Date: 0000000) for Sx LICA [id number].  The patient's neck incision is healed.  The patient has had no stroke or TIA symptoms until yesterday.  This patient noted some L eye visual disturbance: "peeling away part of the vision in left eye".  This has resolved since then.  For VQI Use Only  PRE-ADM LIVING: Home  AMB STATUS: Ambulatory  Social History   Social History  . Marital Status: Married    Spouse Name: N/A  . Number of Children: N/A  . Years of Education: N/A   Occupational History  . Not on file.   Social History Main Topics  . Smoking status: Former Smoker -- 0.50 packs/day for 20 years    Types: Cigarettes    Quit date: 10/11/1988  . Smokeless tobacco: Never Used  . Alcohol Use: 0.0 oz/week    0 Standard drinks or equivalent per week     Comment: Quit drinking in 1990  . Drug Use: No  . Sexual Activity: No   Other Topics Concern  . Not on file   Social History Narrative    Current Outpatient Prescriptions on File Prior to Visit  Medication Sig Dispense Refill  . acetaminophen (TYLENOL) 325 MG tablet Take 650 mg by mouth at bedtime as needed (sleep).    Marland Kitchen aspirin 81 MG tablet Take 1 tablet (81 mg total) by mouth daily.    Marland Kitchen atorvastatin (LIPITOR) 40 MG tablet Take 1 tablet (40 mg total) by mouth daily before breakfast. 30 tablet 11  . canagliflozin (INVOKANA) 100 MG TABS tablet Take 100 mg by mouth daily.    . colchicine 0.6 MG tablet Take 0.6 mg by mouth as needed (gout).    Marland Kitchen diphenhydrAMINE (BENADRYL) 25 MG tablet Take 25 mg by mouth at bedtime as needed for sleep.    . dorzolamide-timolol (COSOPT) 22.3-6.8 MG/ML ophthalmic solution Place 1 drop into both eyes 2 (two) times daily.    . insulin glargine (LANTUS) 100 UNIT/ML injection Inject 56 Units into the skin at bedtime.     . insulin lispro (HUMALOG) 100  UNIT/ML injection Inject 0-30 Units into the skin daily as needed for high blood sugar (sliding scale). Sliding scale      . isosorbide mononitrate (IMDUR) 30 MG 24 hr tablet Take 30 mg by mouth daily.    . isosorbide mononitrate (IMDUR) 30 MG 24 hr tablet Take 1 tablet (30 mg total) by mouth daily. 30 tablet 5  . lisinopril (PRINIVIL,ZESTRIL) 10 MG tablet TAKE ONE TABLET BY MOUTH ONCE DAILY BEFORE BREAKFAST 90 tablet 2  . LYRICA 100 MG capsule Take 100 mg by mouth every other day.     . metFORMIN (GLUCOPHAGE) 1000 MG tablet TAKE ONE TABLET BY MOUTH TWICE DAILY WITH A MEAL 60 tablet 0  . metoprolol tartrate (LOPRESSOR) 25 MG tablet TAKE ONE TABLET BY MOUTH TWICE DAILY 60 tablet 5  . Multiple Vitamins-Minerals (ZINC PO) Take 1 tablet by mouth daily.    . nitroGLYCERIN (NITROSTAT) 0.4 MG SL tablet Place 1 tablet (0.4 mg total) under the tongue every 5 (five) minutes as needed for chest pain. 25 tablet 3  . Omega-3 Fatty Acids (FISH OIL PO) Take 1 capsule by mouth daily.    Marland Kitchen omeprazole (PRILOSEC) 40 MG capsule Take 40 mg by mouth 2 (two) times daily.    Marland Kitchen  prasugrel (EFFIENT) 10 MG TABS tablet Take 10 mg by mouth daily.    . Probiotic Product (PROBIOTIC PO) Take 1 tablet by mouth daily.     No current facility-administered medications on file prior to visit.    Physical Examination  Filed Vitals:   07/18/15 1457 07/18/15 1502  BP: 136/79 131/76  Pulse: 78 78  Temp: 98 F (36.7 C)   Resp: 16     L Neck: Incision is healed Neuro: CN 2-12 are intact , Motor strength is 5/5  bilaterally, sensation is grossly intact  Non-Invasive Vascular Imaging  L Carotid Duplex (Date: 07/18/2015):   L ICA stenosis: no evidence of hyperplasia or restenosis  L VA: patent and antegrade   Medical Decision Making  Seth Young is a 74 y.o. male who presents s/p L CEA for sx LICA Stenosis [id number], new L eye visual disturbance   Unclear to me etiology of this event.    I would make no assumption so  will press forward with work-up with assumption possible TIA, though the carotid duplex demonstrates no evidence recurrent disease.  CTA Neck with aortic arch to evaluate for any plaque possibly responsible.  This will also reimage the endarterectomized artery.  Echocardiogram to look for intracardiac thrombus.  Pt is also going to call his Opthalmologist to get his eye re-evaluate given the concern for possible retinal etiology for his sx.  He will follow up this coming week once these studies are obtained.  Thank you for allowing [place] to participate in this patient's care.  Adele Barthel, MD [facility] [place] Office: [phone] Pager: [phone]

## 2015-07-21 ENCOUNTER — Other Ambulatory Visit: Payer: Self-pay

## 2015-07-21 ENCOUNTER — Emergency Department (HOSPITAL_COMMUNITY): Payer: Medicare Other

## 2015-07-21 ENCOUNTER — Encounter: Payer: Self-pay | Admitting: Vascular Surgery

## 2015-07-21 ENCOUNTER — Ambulatory Visit (HOSPITAL_BASED_OUTPATIENT_CLINIC_OR_DEPARTMENT_OTHER): Payer: Medicare Other

## 2015-07-21 ENCOUNTER — Emergency Department (HOSPITAL_COMMUNITY)
Admission: EM | Admit: 2015-07-21 | Discharge: 2015-07-21 | Disposition: A | Discharge status: Home / Self Care | Payer: Medicare Other | Attending: Emergency Medicine | Admitting: Emergency Medicine

## 2015-07-21 ENCOUNTER — Encounter (HOSPITAL_COMMUNITY): Payer: Self-pay | Admitting: Family Medicine

## 2015-07-21 DIAGNOSIS — Z8669 Personal history of other diseases of the nervous system and sense organs: Secondary | ICD-10-CM | POA: Insufficient documentation

## 2015-07-21 DIAGNOSIS — R42 Dizziness and giddiness: Secondary | ICD-10-CM | POA: Diagnosis not present

## 2015-07-21 DIAGNOSIS — Z87891 Personal history of nicotine dependence: Secondary | ICD-10-CM | POA: Insufficient documentation

## 2015-07-21 DIAGNOSIS — I208 Other forms of angina pectoris: Secondary | ICD-10-CM | POA: Diagnosis not present

## 2015-07-21 DIAGNOSIS — Z794 Long term (current) use of insulin: Secondary | ICD-10-CM | POA: Insufficient documentation

## 2015-07-21 DIAGNOSIS — Z955 Presence of coronary angioplasty implant and graft: Secondary | ICD-10-CM | POA: Insufficient documentation

## 2015-07-21 DIAGNOSIS — R269 Unspecified abnormalities of gait and mobility: Secondary | ICD-10-CM | POA: Diagnosis not present

## 2015-07-21 DIAGNOSIS — I10 Essential (primary) hypertension: Secondary | ICD-10-CM | POA: Insufficient documentation

## 2015-07-21 DIAGNOSIS — I340 Nonrheumatic mitral (valve) insufficiency: Secondary | ICD-10-CM | POA: Diagnosis not present

## 2015-07-21 DIAGNOSIS — Z86010 Personal history of colonic polyps: Secondary | ICD-10-CM | POA: Diagnosis not present

## 2015-07-21 DIAGNOSIS — E119 Type 2 diabetes mellitus without complications: Secondary | ICD-10-CM | POA: Insufficient documentation

## 2015-07-21 DIAGNOSIS — R251 Tremor, unspecified: Secondary | ICD-10-CM | POA: Diagnosis not present

## 2015-07-21 DIAGNOSIS — K219 Gastro-esophageal reflux disease without esophagitis: Secondary | ICD-10-CM | POA: Insufficient documentation

## 2015-07-21 DIAGNOSIS — R69 Illness, unspecified: Secondary | ICD-10-CM | POA: Diagnosis not present

## 2015-07-21 DIAGNOSIS — Z7982 Long term (current) use of aspirin: Secondary | ICD-10-CM | POA: Diagnosis not present

## 2015-07-21 DIAGNOSIS — I351 Nonrheumatic aortic (valve) insufficiency: Secondary | ICD-10-CM | POA: Diagnosis not present

## 2015-07-21 DIAGNOSIS — Z9889 Other specified postprocedural states: Secondary | ICD-10-CM | POA: Insufficient documentation

## 2015-07-21 DIAGNOSIS — M1990 Unspecified osteoarthritis, unspecified site: Secondary | ICD-10-CM | POA: Diagnosis not present

## 2015-07-21 DIAGNOSIS — I071 Rheumatic tricuspid insufficiency: Secondary | ICD-10-CM | POA: Diagnosis not present

## 2015-07-21 DIAGNOSIS — M47892 Other spondylosis, cervical region: Secondary | ICD-10-CM | POA: Diagnosis not present

## 2015-07-21 DIAGNOSIS — Z7984 Long term (current) use of oral hypoglycemic drugs: Secondary | ICD-10-CM | POA: Insufficient documentation

## 2015-07-21 DIAGNOSIS — G459 Transient cerebral ischemic attack, unspecified: Secondary | ICD-10-CM | POA: Diagnosis not present

## 2015-07-21 DIAGNOSIS — Z79899 Other long term (current) drug therapy: Secondary | ICD-10-CM | POA: Diagnosis not present

## 2015-07-21 DIAGNOSIS — Z8701 Personal history of pneumonia (recurrent): Secondary | ICD-10-CM | POA: Insufficient documentation

## 2015-07-21 DIAGNOSIS — M4802 Spinal stenosis, cervical region: Secondary | ICD-10-CM | POA: Diagnosis not present

## 2015-07-21 DIAGNOSIS — Z85828 Personal history of other malignant neoplasm of skin: Secondary | ICD-10-CM | POA: Diagnosis not present

## 2015-07-21 DIAGNOSIS — E785 Hyperlipidemia, unspecified: Secondary | ICD-10-CM | POA: Diagnosis not present

## 2015-07-21 DIAGNOSIS — I517 Cardiomegaly: Secondary | ICD-10-CM | POA: Diagnosis not present

## 2015-07-21 DIAGNOSIS — E782 Mixed hyperlipidemia: Secondary | ICD-10-CM | POA: Insufficient documentation

## 2015-07-21 LAB — URINE MICROSCOPIC-ADD ON: Bacteria, UA: NONE SEEN

## 2015-07-21 LAB — URINALYSIS, ROUTINE W REFLEX MICROSCOPIC
Bilirubin Urine: NEGATIVE
Hgb urine dipstick: NEGATIVE
Ketones, ur: NEGATIVE mg/dL
LEUKOCYTES UA: NEGATIVE
Nitrite: NEGATIVE
PH: 6.5 (ref 5.0–8.0)
Protein, ur: NEGATIVE mg/dL
Specific Gravity, Urine: 1.024 (ref 1.005–1.030)

## 2015-07-21 LAB — BASIC METABOLIC PANEL
Anion gap: 7 (ref 5–15)
BUN: 13 mg/dL (ref 6–20)
CO2: 26 mmol/L (ref 22–32)
Calcium: 9.5 mg/dL (ref 8.9–10.3)
Chloride: 103 mmol/L (ref 101–111)
Creatinine, Ser: 1.16 mg/dL (ref 0.61–1.24)
GFR calc Af Amer: >60 mL/min (ref >60)
GFR calc non Af Amer: >60 mL/min (ref >60)
Glucose, Bld: 137 mg/dL — ABNORMAL HIGH (ref 65–99)
Potassium: 4.3 mmol/L (ref 3.5–5.1)
SODIUM: 136 mmol/L (ref 135–145)

## 2015-07-21 LAB — CBC
HEMATOCRIT: 46.8 % (ref 39.0–52.0)
Hemoglobin: 14.6 g/dL (ref 13.0–17.0)
MCH: 24.9 pg — ABNORMAL LOW (ref 26.0–34.0)
MCHC: 31.2 g/dL (ref 30.0–36.0)
MCV: 79.7 fL (ref 78.0–100.0)
Platelets: 182 10*3/uL (ref 150–400)
RBC: 5.87 MIL/uL — AB (ref 4.22–5.81)
RDW: 17.3 % — AB (ref 11.5–15.5)
WBC: 6.1 10*3/uL (ref 4.0–10.5)

## 2015-07-21 NOTE — ED Notes (Signed)
Pt here for unsteady gait. sts that he has been battling vertigo, which has resolved and vision problems. sts he has been receiving injections for the eye problem. Sent here by his doctor for MRI.

## 2015-07-21 NOTE — ED Notes (Signed)
Pt ambulated to bathroom with no difficulty 

## 2015-07-21 NOTE — Discharge Instructions (Signed)
As discussed, your evaluation today has been largely reassuring.  But, it is important that you monitor your condition carefully, and do not hesitate to return to the ED if you develop new, or concerning changes in your condition.  Please be sure to keep your upcoming appointments, and to call your physician tomorrow to discuss today's results.

## 2015-07-21 NOTE — ED Provider Notes (Signed)
CSN: [id number]     Arrival date & time 07/21/15  1254 History   First MD Initiated Contact with Patient 07/21/15 1540     Chief Complaint  Patient presents with  . Gait Problem     (Consider location/radiation/quality/duration/timing/severity/associated sxs/prior Treatment) HPI Patient presents with concern of new gait difficulty. Patient has history of insulin dependent diabetes, hypertension, but has been doing generally well. Over the past days, the patient has developed new dizziness, gait instability. Prior to the onset of this illness, the patient has had recent carotid artery procedure, uncomplicated according to him. This illness initially was mild lightheadedness, and for which the patient have his typical beta blocker dose. Subsequent, the patient developed palpitations. Beta blocker was increased back to typical dose, with resolution of palpitations. Mild lightheadedness, dizziness has persisted, become worse over the past few days. During this illness, the patient has had echocardiogram, earlier today, and is currently scheduled for carotid angiography. Today, the patient presented to his physician, and with concern for persistent dizziness, he was referred here.  Patient notes that his typical dizziness, described as a room spinning sensation, has now changed to difficulty with ambulation, specifically difficulty with lower extremity coordination. He denies new incontinence, upper extremity weakness or changes. No confusion, disorientation. No chest pain, dyspnea. No other new medication changes.  Past Medical History  Diagnosis Date  . Hypertension   . Colon polyps 10-12-11    past hx.  . Exertional angina ([place]) 06/26/2014    Cath with DES to the OM, Bioflow protocol   . High cholesterol   . Pneumonia 1940's X 2  . Type II diabetes mellitus ([place]) dx'd 1982    nsulin started ~ 2000  . H/O hiatal hernia     no problems  . GERD (gastroesophageal reflux disease)    . Arthritis     "knees, toes, hands" (06/26/2014)  . Pseudogout     "it moves around"  . Ischemic chest pain ([place]) 08/07/2014  . Sleep apnea     no cpap used  . Basal cell carcinoma     left temporal area-no residual  . Peripheral neuropathy ([place])   . Occasional tremors     Bilateral hands   Past Surgical History  Procedure Laterality Date  . Back surgery    . Nasal septum surgery  1979  . Carpal tunnel release Left 10/2009  . Knee arthroscopy Left 05/2011    10'12-left knee  . Shoulder open rotator cuff repair  10/14/2011    Procedure: ROTATOR CUFF REPAIR SHOULDER OPEN;  Surgeon: Johnn Hai, MD;  Location: [facility];  Service: Orthopedics;  Laterality: Right;  . Subacromial decompression  10/14/2011    Procedure: SUBACROMIAL DECOMPRESSION;  Surgeon: Johnn Hai, MD;  Location: [facility];  Service: Orthopedics;  Laterality: Right;  . Cardiac catheterization  02/2005    Dr. Mare Ferrari; negative.  . Coronary angioplasty with stent placement  06/26/2014    "1"  . Posterior lumbar fusion  1964  . Carpal tunnel release Right ~ 2013  . Cataract extraction w/ intraocular lens implant Left 2000's  . Cataract extraction w/ intraocular lens implant Right 2015  . Elbow surgery Bilateral ~ 2014    "for blockages; Dr. Amedeo Plenty"  . Trigger finger release Right ~ 2013-2014    "3rd & 4th digits"  . Basal cell carcinoma excision Left ~ 2012    face  . Coronary angioplasty with stent placement  06/26/2014    pLAD 50%, d LAD  60%, oD1 80%,  mD1  70%, CFX 50%, OM 2 70%,  RCA 80%, PDA 95% (<54mm), OM1 99%-0% with Bio flow stent       . Left heart catheterization with coronary angiogram N/A 06/26/2014    Procedure: LEFT HEART CATHETERIZATION WITH CORONARY ANGIOGRAM;  Surgeon: Blane Ohara, MD;  Location: [facility] C[facility] LAB;  Service: Cardiovascular;  Laterality: N/A;  . Left heart catheterization with coronary angiogram N/A 08/07/2014    Procedure: LEFT HEART CATHETERIZATION WITH CORONARY ANGIOGRAM;   Surgeon: Blane Ohara, MD;  Location: [facility];  Service: Cardiovascular;  Laterality: N/A;  . Colonoscopy w/ polypectomy    . Endarterectomy Left 04/23/2015    Procedure: ENDARTERECTOMY CAROTID;  Surgeon: Conrad Culbertson, MD;  Location: [place];  Service: Vascular;  Laterality: Left;  . Patch angioplasty Left 04/23/2015    Procedure: PATCH ANGIOPLASTY USING 1CM X 6CM XENOSURE BIOLOGIC PATCH;  Surgeon: Conrad Hillsboro, MD;  Location: [place];  Service: Vascular;  Laterality: Left;  . Carotid endarterectomy Left Ssept. 14, 2016    CEA   Family History  Problem Relation Age of Onset  . Diabetes Mother   . Varicose Veins Mother   . Heart attack Father 74    "massive heart attack"  . Cancer Sister   . Diabetes Sister   . Hypertension Sister   . Varicose Veins Sister   . Diabetes Brother   . Hypertension Brother    Social History  Substance Use Topics  . Smoking status: Former Smoker -- 0.50 packs/day for 20 years    Types: Cigarettes    Quit date: 10/11/1988  . Smokeless tobacco: Never Used  . Alcohol Use: 0.0 oz/week    0 Standard drinks or equivalent per week     Comment: Quit drinking in 1990    Review of Systems  Constitutional:       Per HPI, otherwise negative  HENT:       Per HPI, otherwise negative  Respiratory:       Per HPI, otherwise negative  Cardiovascular:       Per HPI, otherwise negative  Gastrointestinal: Negative for vomiting.  Endocrine:       Negative aside from HPI  Genitourinary:       Neg aside from HPI   Musculoskeletal:       Per HPI, otherwise negative  Skin: Negative.   Neurological: Positive for dizziness, tremors (Baseline tremor right upper extremity, unchanged) and light-headedness. Negative for seizures, syncope, facial asymmetry, speech difficulty and numbness.      Allergies  Gabapentin; Morphine and related; and Naproxen  Home Medications   Prior to Admission medications   Medication Sig Start Date End Date Taking? Authorizing  Provider  acetaminophen (TYLENOL) 325 MG tablet Take 650 mg by mouth at bedtime as needed (sleep).    Historical Provider, MD  aspirin 81 MG tablet Take 1 tablet (81 mg total) by mouth daily. 06/27/14   Rhonda G Barrett, PA-C  atorvastatin (LIPITOR) 40 MG tablet Take 1 tablet (40 mg total) by mouth daily before breakfast. 06/27/14   Evelene Croon Barrett, PA-C  canagliflozin (INVOKANA) 100 MG TABS tablet Take 100 mg by mouth daily.    Historical Provider, MD  colchicine 0.6 MG tablet Take 0.6 mg by mouth as needed (gout).    Historical Provider, MD  diphenhydrAMINE (BENADRYL) 25 MG tablet Take 25 mg by mouth at bedtime as needed for sleep.    Historical Provider, MD  dorzolamide-timolol (COSOPT)  22.3-6.8 MG/ML ophthalmic solution Place 1 drop into both eyes 2 (two) times daily.    Historical Provider, MD  insulin glargine (LANTUS) 100 UNIT/ML injection Inject 56 Units into the skin at bedtime.     Historical Provider, MD  insulin lispro (HUMALOG) 100 UNIT/ML injection Inject 0-30 Units into the skin daily as needed for high blood sugar (sliding scale). Sliding scale      Historical Provider, MD  isosorbide mononitrate (IMDUR) 30 MG 24 hr tablet Take 30 mg by mouth daily.    Historical Provider, MD  isosorbide mononitrate (IMDUR) 30 MG 24 hr tablet Take 1 tablet (30 mg total) by mouth daily. 06/27/15   Darlin Coco, MD  lisinopril (PRINIVIL,ZESTRIL) 10 MG tablet TAKE ONE TABLET BY MOUTH ONCE DAILY BEFORE BREAKFAST 06/26/15   Darlin Coco, MD  LYRICA 100 MG capsule Take 100 mg by mouth every other day.  04/03/15   Historical Provider, MD  metFORMIN (GLUCOPHAGE) 1000 MG tablet TAKE ONE TABLET BY MOUTH TWICE DAILY WITH A MEAL 03/21/15   Darlin Coco, MD  metoprolol tartrate (LOPRESSOR) 25 MG tablet TAKE ONE TABLET BY MOUTH TWICE DAILY 06/18/15   Darlin Coco, MD  Multiple Vitamins-Minerals (ZINC PO) Take 1 tablet by mouth daily.    Historical Provider, MD  nitroGLYCERIN (NITROSTAT) 0.4 MG SL  tablet Place 1 tablet (0.4 mg total) under the tongue every 5 (five) minutes as needed for chest pain. 07/10/14   Darlin Coco, MD  Omega-3 Fatty Acids (FISH OIL PO) Take 1 capsule by mouth daily.    Historical Provider, MD  omeprazole (PRILOSEC) 40 MG capsule Take 40 mg by mouth 2 (two) times daily.    Historical Provider, MD  prasugrel (EFFIENT) 10 MG TABS tablet Take 10 mg by mouth daily.    Historical Provider, MD  Probiotic Product (PROBIOTIC PO) Take 1 tablet by mouth daily.    Historical Provider, MD   BP 132/65 mmHg  Pulse 74  Temp(Src) 98.2 F (36.8 C) (Oral)  Resp 16  SpO2 100% Physical Exam  Constitutional: He is oriented to person, place, and time. He appears well-developed. No distress.  HENT:  Head: Normocephalic and atraumatic.  Eyes: Conjunctivae and EOM are normal.  Cardiovascular: Normal rate and regular rhythm.   Pulmonary/Chest: Effort normal. No stridor. No respiratory distress.  Abdominal: He exhibits no distension.  Musculoskeletal: He exhibits no edema.  Neurological: He is alert and oriented to person, place, and time. He displays no atrophy and no tremor. No cranial nerve deficit or sensory deficit. He exhibits normal muscle tone. He displays no seizure activity.  No appreciable tremor, no appreciable objective deficiencies  Skin: Skin is warm and dry.  Psychiatric: He has a normal mood and affect.  Nursing note and vitals reviewed.   ED Course  Procedures (including critical care time) Labs Review Labs Reviewed  BASIC METABOLIC PANEL - Abnormal; Notable for the following:    Glucose, Bld 137 (*)    All other components within normal limits  CBC - Abnormal; Notable for the following:    RBC 5.87 (*)    MCH 24.9 (*)    RDW 17.3 (*)    All other components within normal limits  URINALYSIS, ROUTINE W REFLEX MICROSCOPIC (NOT AT [facility]) - Abnormal; Notable for the following:    Glucose, UA >1000 (*)    All other components within normal limits  URINE  MICROSCOPIC-ADD ON - Abnormal; Notable for the following:    Squamous Epithelial / LPF 0-5 (*)  All other components within normal limits  CBG MONITORING, ED    Imaging Review Mr Brain Wo Contrast  07/21/2015  CLINICAL DATA:  Unsteady gait.  Vertigo.  Visual disturbance. EXAM: MRI HEAD WITHOUT CONTRAST TECHNIQUE: Multiplanar, multiecho pulse sequences of the brain and surrounding structures were obtained without intravenous contrast. COMPARISON:  11/12/2007 FINDINGS: Diffusion imaging does not show any acute or subacute infarction. The brainstem and cerebellum are normal. The cerebral hemispheres show mild age related volume loss without old or acute small or large vessel infarction. No mass lesion, hemorrhage, hydrocephalus or extra-axial collection. No pituitary mass. No inflammatory sinus disease. No skull or skullbase lesion. Major vessels at the base of the brain show flow. IMPRESSION: Age related atrophy. No evidence of old or acute focal insult. No specific cause of the presenting symptoms is identified. Electronically Signed   By: Nelson Chimes M.D.   On: 07/21/2015 17:02   I have personally reviewed and evaluated these images and lab results as part of my medical decision-making.  I reviewed the patient's forms from his 73 office.   ECHO earlier today Study Conclusions  - Left ventricle: The cavity size was normal. There was severe   focal basal and mild concentric hypertrophy of the left   ventricle. Systolic function was vigorous. The estimated ejection   fraction was in the range of 65% to 70%. Wall motion was normal;   there were no regional wall motion abnormalities. Doppler   parameters are consistent with abnormal left ventricular   relaxation (grade 1 diastolic dysfunction). Doppler parameters   are consistent with elevated ventricular end-diastolic filling   pressure. - Aortic valve: Trileaflet; normal thickness leaflets. There was   mild to moderate  regurgitation. - Aortic root: The aortic root was normal in size. - Ascending aorta: The ascending aorta was normal in size. - Mitral valve: Structurally normal valve. There was trivial   regurgitation. - Left atrium: The atrium was normal in size. - Right ventricle: The cavity size was normal. Wall thickness was   normal. - Right atrium: The atrium was normal in size. - Tricuspid valve: There was mild regurgitation. - Pulmonic valve: There was no regurgitation. - Pulmonary arteries: Systolic pressure was within the normal   range. - Inferior vena cava: The vessel was normal in size. - Pericardium, extracardiac: There was no pericardial effusion.  Transthoracic echocardiography.  M-mode, complete 2D, spectral Doppler, and color Doppler.  Birthdate:  Patient birthdate: [birth year].  Age:  Patient is 74 yr old.  Sex:  Gender: male. BMI: 33.2 kg/m^2.  Blood pressure:     128/70  Patient status: Outpatient.  Study date:  Study date: 07/21/2015. Study time: 08:35 AM.  Location:  [facility]   EMR also notable for demonstration of CAD, particularly LAD disease>50%.  6:55 PM On repeat exam the patient is awake, alert, ambulatory, states that he feels better. We reviewed all findings, and the patient will follow up with his physician, and with radiology for additional studies tomorrow.   MDM  Patient presents with ongoing dizziness.  Pneumonia, patient is awake, alert, in no distress. Patient has no obvious neurologic deficits, and improved here. With reassuring MRI, echocardiogram, performed earlier today, and close outpatient follow-up, including tomorrow, the patient was prepared for discharge with further evaluation to occur as an outpatient. The low suspicion for occult stroke, no evidence for ongoing infection. Patient is a have mild glucosuria, but glucose is close to normal, with no evidence for substantial acidosis,  DKA. Aware of return precautions, follow-up  instructions.  Carmin Muskrat, MD 07/21/15 [phone]

## 2015-07-21 NOTE — ED Notes (Signed)
Patient transported to MRI 

## 2015-07-22 ENCOUNTER — Encounter (HOSPITAL_COMMUNITY): Payer: Self-pay

## 2015-07-22 ENCOUNTER — Ambulatory Visit (HOSPITAL_COMMUNITY)
Admission: RE | Admit: 2015-07-22 | Discharge: 2015-07-22 | Disposition: A | Discharge status: Home / Self Care | Payer: Medicare Other | Source: Ambulatory Visit | Attending: Vascular Surgery | Admitting: Vascular Surgery

## 2015-07-22 DIAGNOSIS — M47892 Other spondylosis, cervical region: Secondary | ICD-10-CM | POA: Insufficient documentation

## 2015-07-22 DIAGNOSIS — R42 Dizziness and giddiness: Secondary | ICD-10-CM | POA: Insufficient documentation

## 2015-07-22 DIAGNOSIS — Z87891 Personal history of nicotine dependence: Secondary | ICD-10-CM | POA: Insufficient documentation

## 2015-07-22 DIAGNOSIS — I517 Cardiomegaly: Secondary | ICD-10-CM | POA: Insufficient documentation

## 2015-07-22 DIAGNOSIS — M4802 Spinal stenosis, cervical region: Secondary | ICD-10-CM | POA: Insufficient documentation

## 2015-07-22 DIAGNOSIS — E785 Hyperlipidemia, unspecified: Secondary | ICD-10-CM | POA: Insufficient documentation

## 2015-07-22 DIAGNOSIS — I071 Rheumatic tricuspid insufficiency: Secondary | ICD-10-CM | POA: Insufficient documentation

## 2015-07-22 DIAGNOSIS — G459 Transient cerebral ischemic attack, unspecified: Secondary | ICD-10-CM | POA: Diagnosis not present

## 2015-07-22 DIAGNOSIS — I10 Essential (primary) hypertension: Secondary | ICD-10-CM | POA: Insufficient documentation

## 2015-07-22 DIAGNOSIS — I351 Nonrheumatic aortic (valve) insufficiency: Secondary | ICD-10-CM | POA: Insufficient documentation

## 2015-07-22 DIAGNOSIS — E119 Type 2 diabetes mellitus without complications: Secondary | ICD-10-CM | POA: Insufficient documentation

## 2015-07-22 DIAGNOSIS — I340 Nonrheumatic mitral (valve) insufficiency: Secondary | ICD-10-CM | POA: Insufficient documentation

## 2015-07-22 MED ORDER — IOHEXOL 350 MG/ML SOLN
50.0000 mL | Freq: Once | INTRAVENOUS | Status: AC | PRN
  Administered 2015-07-22: 50 mL via INTRAVENOUS

## 2015-07-23 ENCOUNTER — Ambulatory Visit (INDEPENDENT_AMBULATORY_CARE_PROVIDER_SITE_OTHER): Payer: Medicare Other | Admitting: Vascular Surgery

## 2015-07-23 VITALS — BP 131/73 | HR 70 | Temp 97.2°F | Resp 18 | Ht 71.5 in | Wt 240.0 lb

## 2015-07-23 DIAGNOSIS — I739 Peripheral vascular disease, unspecified: Principal | ICD-10-CM

## 2015-07-23 DIAGNOSIS — I779 Disorder of arteries and arterioles, unspecified: Secondary | ICD-10-CM

## 2015-07-23 NOTE — Progress Notes (Signed)
Postoperative Visit   History of Present Illness  Seth Young is a 74 y.o. male who presents for postoperative follow-up for: L CEA (Date: 04/23/15).  Pt has not had any further eye sx, but has had vertigo severely affecting his ambulation.  He returns for results of his TTE and CTA Neck.  For VQI Use Only  PRE-ADM LIVING: Home  AMB STATUS: Ambulatory  Social History   Social History  . Marital Status: Married    Spouse Name: N/A  . Number of Children: N/A  . Years of Education: N/A   Occupational History  . Not on file.   Social History Main Topics  . Smoking status: Former Smoker -- 0.50 packs/day for 20 years    Types: Cigarettes    Quit date: 10/11/1988  . Smokeless tobacco: Never Used  . Alcohol Use: 0.0 oz/week    0 Standard drinks or equivalent per week     Comment: Quit drinking in 1990  . Drug Use: No  . Sexual Activity: No   Other Topics Concern  . Not on file   Social History Narrative    Current Outpatient Prescriptions on File Prior to Visit  Medication Sig Dispense Refill  . acetaminophen (TYLENOL) 325 MG tablet Take 650 mg by mouth at bedtime as needed (sleep).    Marland Kitchen aspirin 81 MG tablet Take 1 tablet (81 mg total) by mouth daily.    Marland Kitchen atorvastatin (LIPITOR) 40 MG tablet Take 1 tablet (40 mg total) by mouth daily before breakfast. 30 tablet 11  . canagliflozin (INVOKANA) 100 MG TABS tablet Take 100 mg by mouth daily.    . colchicine 0.6 MG tablet Take 0.6 mg by mouth as needed (gout).    Marland Kitchen diphenhydrAMINE (BENADRYL) 25 MG tablet Take 25 mg by mouth at bedtime as needed for sleep.    . dorzolamide-timolol (COSOPT) 22.3-6.8 MG/ML ophthalmic solution Place 1 drop into both eyes 2 (two) times daily.    . insulin glargine (LANTUS) 100 UNIT/ML injection Inject 56 Units into the skin at bedtime.     . insulin lispro (HUMALOG) 100 UNIT/ML injection Inject 0-30 Units into the skin daily as needed for high blood sugar (sliding scale). Sliding scale    . isosorbide mononitrate (IMDUR) 30 MG 24 hr tablet Take 30 mg by mouth daily.    . isosorbide mononitrate (IMDUR) 30 MG 24 hr tablet Take 1 tablet (30 mg total) by mouth daily. 30 tablet 5  . lisinopril (PRINIVIL,ZESTRIL) 10 MG tablet TAKE ONE TABLET BY MOUTH ONCE DAILY BEFORE BREAKFAST 90 tablet 2  . LYRICA 100 MG capsule Take 100 mg by mouth every other day.     . metFORMIN (GLUCOPHAGE) 1000 MG tablet TAKE ONE TABLET BY MOUTH TWICE DAILY WITH A MEAL 60 tablet 0  . metoprolol tartrate (LOPRESSOR) 25 MG tablet TAKE ONE TABLET BY MOUTH TWICE DAILY 60 tablet 5  . Multiple Vitamins-Minerals (ZINC PO) Take 1 tablet by mouth daily.    . nitroGLYCERIN (NITROSTAT) 0.4 MG SL tablet Place 1 tablet (0.4 mg total) under the tongue every 5 (five) minutes as needed for chest pain. 25 tablet 3  . Omega-3 Fatty Acids (FISH OIL PO) Take 1 capsule by mouth daily.    Marland Kitchen omeprazole (PRILOSEC) 40 MG capsule Take 40 mg by mouth 2 (two) times daily.    . prasugrel (EFFIENT) 10 MG TABS tablet Take 10 mg by mouth daily.    . Probiotic Product (PROBIOTIC PO) Take 1  tablet by mouth daily.     No current facility-administered medications on file prior to visit.    Physical Examination  Filed Vitals:   07/23/15 1436 07/23/15 1437  BP: 141/70 131/73  Pulse: 70 70  Temp:    Resp:     CTA Neck (07/22/15) Status post LEFT carotid endarterectomy. No adverse features.  Non stenotic plaque RIGHT internal carotid artery.  No posterior circulation abnormalities of significance.  Moderate to severe spinal stenosis at C4-5 related to spondylosis and OPLL.  I reviewed this patient's CTA Neck: no obvious thrombus or plaque in entire L carotid system.  No obvious aortic arch plaque or thrombus to account for this patient's sx.  MRI Head (07/22/15) Age related atrophy. No evidence of old or acute focal insult. No specific cause of the presenting symptoms is identified.  TTE (07/21/15) - Left ventricle: The  cavity size was normal. There was severe focal basal and mild concentric hypertrophy of the left ventricle. Systolic function was vigorous. The estimated ejection fraction was in the range of 65% to 70%. Wall motion was normal; there were no regional wall motion abnormalities. Doppler parameters are consistent with abnormal left ventricular relaxation (grade 1 diastolic dysfunction). Doppler parameters are consistent with elevated ventricular end-diastolic filling pressure. - Aortic valve: Trileaflet; normal thickness leaflets. There was mild to moderate regurgitation. - Aortic root: The aortic root was normal in size. - Ascending aorta: The ascending aorta was normal in size. - Mitral valve: Structurally normal valve. There was trivial regurgitation. - Left atrium: The atrium was normal in size. - Right ventricle: The cavity size was normal. Wall thickness was normal. - Right atrium: The atrium was normal in size. - Tricuspid valve: There was mild regurgitation. - Pulmonic valve: There was no regurgitation. - Pulmonary arteries: Systolic pressure was within the normal range. - Inferior vena cava: The vessel was normal in size. - Pericardium, extracardiac: There was no pericardial effusion.  Medical Decision Making  Seth Young is a 74 y.o. male who presents s/p L CEA.  Again, it unclear the etiology of this patient's L eye disturbance but it does not appear to be due to his carotid system, arch or heart.  I would pursue the Opth etiologies, the patient is going to get in touch with his eye MD today.  In regards to the vertigo, I doubt his sx are related to any carotid disease.  The patient will follow up at his previously arranged 9 month follow up.  Thank you for allowing [place] to participate in this patient's care.  Adele Barthel, MD [facility] Office: [phone] Pager: [phone]

## 2015-07-29 ENCOUNTER — Encounter: Payer: Self-pay | Admitting: Neurology

## 2015-07-29 ENCOUNTER — Ambulatory Visit (INDEPENDENT_AMBULATORY_CARE_PROVIDER_SITE_OTHER): Payer: Medicare Other | Admitting: Neurology

## 2015-07-29 VITALS — BP 116/66 | HR 71 | Resp 18 | Ht 71.5 in | Wt 238.0 lb

## 2015-07-29 DIAGNOSIS — H8110 Benign paroxysmal vertigo, unspecified ear: Secondary | ICD-10-CM | POA: Diagnosis not present

## 2015-07-29 DIAGNOSIS — R269 Unspecified abnormalities of gait and mobility: Secondary | ICD-10-CM | POA: Diagnosis not present

## 2015-07-29 DIAGNOSIS — IMO0002 Reserved for concepts with insufficient information to code with codable children: Secondary | ICD-10-CM

## 2015-07-29 NOTE — Patient Instructions (Addendum)
Please remember, that vertigo can recur without warning, triggers could be stress, sleep deprivation, dehydration and even taking a bumpy car ride, sometimes an acute illness, even a common (viral) cold. It can last hours or days. Please change positions slowly and always stay well-hydrated. Physical therapy with particular attention to vestibular rehabilitation can be very helpful. While there is no specific medication that helps with vertigo, some people get relief with as needed use of meclizine. Certain medications can exacerbate vertigo.   For gait safety use a cane.   We will request physical therapy.   We will consider a referral to ENT specialist if needed.

## 2015-07-29 NOTE — Progress Notes (Signed)
Subjective:    Patient ID: Seth Young is a 74 y.o. male.  HPI     Star Age, MD, PhD [facility] [address] [place], [place] [address]  Dear Dr. [name],   I saw your patient, Seth Young, upon your kind request in my neurologic clinic today for initial consultation of his recurrent vertigo. The patient is accompanied by his wifetoday. As you know, Seth Young is a 74 year old right-handed gentleman with an underlying complex medical history of diabetes, complicated by retinopathy, peripheral neuropathy, history of obstructive sleep apnea, COPD, coronary artery disease, status post stent placement, depression, obesity, hyperlipidemia, hypertension, carotid artery disease, status post left carotid endarterectomy in 2016, and osteoarthritis, who presents with new onset vertigo which started suddenly on 07/17/2015. He had a sensation of the room spinning particularly with head movements and sudden body position changes. He felt queasy as and nauseated but did not have any vomiting or any severe nausea. He saw you in the office and was given a prescription for meclizine which helped his vertigo symptoms but he felt wobbly in his legs and started having problems with balance and his gait. Thankfully he did not fall. He presented to the emergency room on 07/21/2015 with new onset difficulty with his walking. He described ongoing issues with vertiginous symptoms and some lightheadedness was reported as well. I reviewed the emergency room records from 07/21/2015. Overall, he currently feels improved, but not quite back to baseline. He denies hearing loss or tinnitus. He denies any one-sided weakness, numbness, slurring of speech, droopy face or recurrent headaches. He does have a history of compression neuropathies and had surgeries to both elbows several years ago. He has some crypts strength weakness which is not new and he has a history of peripheral  neuropathy, likely secondary to diabetes of many years duration. He has a cane. He has not used it today but has used it in the recent past because of gait difficulties. Years ago he saw ENT for chronic sinus issues and losing his sense of smell. Of note, prior to his vertigo symptoms he started having cold symptoms and still has some residual congestion.  Of note, he carries a diagnosis of obstructive sleep apnea and was never treated with CPAP therapy and his symptoms improved after he lost weight. His wife reports no significant snoring at this time and no apneic breathing pauses while he is asleep.  Of note, a couple of years ago he had a similar vertigo attack but it was not as severe as this one he recalls.  He had an echocardiogram on 07/21/2015 which showed an EF of 65-70% and grade 1 diastolic dysfunction. There was mild to moderate aortic regurgitation, trivial mitral regurgitation and overall benign findings. I reviewed your office note from 07/29/2015, which you kindly included.   He had a brain MRI without contrast on 07/21/2015, and I reviewed the results:  Age related atrophy. No evidence of old or acute focal insult. No specific cause of the presenting symptoms is identified.  In addition, I personally reviewed the images through the PACS system, and I agree with the findings but his atrophy appears to be advanced for age to my review.  He follows with Dr. Bridgett Larsson in vascular surgery for his carotid artery disease.  He also had carotid artery Doppler study on 07/18/2015, which showed patent left carotid endarterectomy site with no evidence of hyperplasia or restenosis. Resistive flow noted in the common carotid and  external carotid artery. This was a left-sided exam only and his first postoperative exam. Subsequently, he had a CT angiogram of his neck and aortic arch on 07/22/2015: Status post LEFT carotid endarterectomy.  No adverse features.   Non stenotic plaque RIGHT internal carotid  artery.   No posterior circulation abnormalities of significance.   Moderate to severe spinal stenosis at C4-5 related to spondylosis and OPLL.  His Past Medical History Is Significant For: Past Medical History  Diagnosis Date  . Hypertension   . Colon polyps 10-12-11    past hx.  . Exertional angina ([place] [facility]) 06/26/2014    Cath with DES to the OM, Bioflow protocol   . High cholesterol   . Pneumonia 1940's X 2  . Type II diabetes mellitus ([place]) dx'd 1982    nsulin started ~ 2000  . H/O hiatal hernia     no problems  . GERD (gastroesophageal reflux disease)   . Arthritis     "knees, toes, hands" (06/26/2014)  . Pseudogout     "it moves around"  . Ischemic chest pain ([place]) 08/07/2014  . Sleep apnea     no cpap used  . Basal cell carcinoma     left temporal area-no residual  . Peripheral neuropathy ([place])   . Occasional tremors     Bilateral hands  . Vertigo   . Ataxia     His Past Surgical History Is Significant For: Past Surgical History  Procedure Laterality Date  . Back surgery    . Nasal septum surgery  1979  . Carpal tunnel release Left 10/2009  . Knee arthroscopy Left 05/2011    10'12-left knee  . Shoulder open rotator cuff repair  10/14/2011    Procedure: ROTATOR CUFF REPAIR SHOULDER OPEN;  Surgeon: Johnn Hai, MD;  Location: [facility];  Service: Orthopedics;  Laterality: Right;  . Subacromial decompression  10/14/2011    Procedure: SUBACROMIAL DECOMPRESSION;  Surgeon: Johnn Hai, MD;  Location: [facility];  Service: Orthopedics;  Laterality: Right;  . Cardiac catheterization  02/2005    Dr. Mare Ferrari; negative.  . Coronary angioplasty with stent placement  06/26/2014    "1"  . Posterior lumbar fusion  1964  . Carpal tunnel release Right ~ 2013  . Cataract extraction w/ intraocular lens implant Left 2000's  . Cataract extraction w/ intraocular lens implant Right 2015  . Elbow surgery Bilateral ~ 2014    "for blockages; Dr. Amedeo Plenty"  . Trigger finger release Right  ~ 2013-2014    "3rd & 4th digits"  . Basal cell carcinoma excision Left ~ 2012    face  . Coronary angioplasty with stent placement  06/26/2014    pLAD 50%, d LAD 60%, oD1 80%,  mD1  70%, CFX 50%, OM 2 70%,  RCA 80%, PDA 95% (<5mm), OM1 99%-0% with Bio flow stent       . Left heart catheterization with coronary angiogram N/A 06/26/2014    Procedure: LEFT HEART CATHETERIZATION WITH CORONARY ANGIOGRAM;  Surgeon: Blane Ohara, MD;  Location: [facility] CATH LAB;  Service: Cardiovascular;  Laterality: N/A;  . Left heart catheterization with coronary angiogram N/A 08/07/2014    Procedure: LEFT HEART CATHETERIZATION WITH CORONARY ANGIOGRAM;  Surgeon: Blane Ohara, MD;  Location: [facility] CATH LAB;  Service: Cardiovascular;  Laterality: N/A;  . Colonoscopy w/ polypectomy    . Endarterectomy Left 04/23/2015    Procedure: ENDARTERECTOMY CAROTID;  Surgeon: Conrad Black Forest, MD;  Location: [place];  Service: Vascular;  Laterality: Left;  . Patch angioplasty Left 04/23/2015    Procedure: PATCH ANGIOPLASTY USING 1CM X 6CM XENOSURE BIOLOGIC PATCH;  Surgeon: Conrad Valley Ford, MD;  Location: [place];  Service: Vascular;  Laterality: Left;  . Carotid endarterectomy Left Ssept. 14, 2016    CEA    His Family History Is Significant For: Family History  Problem Relation Age of Onset  . Diabetes Mother   . Varicose Veins Mother   . Heart attack Father 70    "massive heart attack"  . Cancer Sister   . Diabetes Sister   . Hypertension Sister   . Varicose Veins Sister   . Diabetes Brother   . Hypertension Brother     His Social History Is Significant For: Social History   Social History  . Marital Status: Married    Spouse Name: N/A  . Number of Children: 5  . Years of Education: College   Social History Main Topics  . Smoking status: Former Smoker -- 0.50 packs/day for 20 years    Types: Cigarettes    Quit date: 10/11/1988  . Smokeless tobacco: Never Used  . Alcohol Use: No     Comment: Quit drinking in 1990   . Drug Use: No  . Sexual Activity: No   Other Topics Concern  . None   Social History Narrative   Drinks about 2 cups of coffee a day, occasional diet pepsi.     His Allergies Are:  Allergies  Allergen Reactions  . Gabapentin     Ataxia  . Morphine And Related Itching    Itching   . Naproxen Other (See Comments)    Tremors, hallucinates   :   His Current Medications Are:  Outpatient Encounter Prescriptions as of 07/29/2015  Medication Sig  . acetaminophen (TYLENOL) 325 MG tablet Take 650 mg by mouth at bedtime as needed (sleep).  Marland Kitchen aspirin 81 MG tablet Take 1 tablet (81 mg total) by mouth daily.  Marland Kitchen atorvastatin (LIPITOR) 40 MG tablet Take 1 tablet (40 mg total) by mouth daily before breakfast.  . canagliflozin (INVOKANA) 100 MG TABS tablet Take 100 mg by mouth daily.  . colchicine 0.6 MG tablet Take 0.6 mg by mouth as needed (gout).  Marland Kitchen diphenhydrAMINE (BENADRYL) 25 MG tablet Take 25 mg by mouth at bedtime as needed for sleep.  . dorzolamide-timolol (COSOPT) 22.3-6.8 MG/ML ophthalmic solution Place 1 drop into both eyes 2 (two) times daily.  . insulin glargine (LANTUS) 100 UNIT/ML injection Inject 56 Units into the skin at bedtime.   . insulin lispro (HUMALOG) 100 UNIT/ML injection Inject 0-30 Units into the skin daily as needed for high blood sugar (sliding scale). Sliding scale    . isosorbide mononitrate (IMDUR) 30 MG 24 hr tablet Take 30 mg by mouth daily.  . isosorbide mononitrate (IMDUR) 30 MG 24 hr tablet Take 1 tablet (30 mg total) by mouth daily.  Marland Kitchen lisinopril (PRINIVIL,ZESTRIL) 10 MG tablet TAKE ONE TABLET BY MOUTH ONCE DAILY BEFORE BREAKFAST  . LYRICA 100 MG capsule Take 100 mg by mouth every other day.   . metFORMIN (GLUCOPHAGE) 1000 MG tablet TAKE ONE TABLET BY MOUTH TWICE DAILY WITH A MEAL  . metoprolol tartrate (LOPRESSOR) 25 MG tablet TAKE ONE TABLET BY MOUTH TWICE DAILY  . Multiple Vitamins-Minerals (ZINC PO) Take 1 tablet by mouth daily.  . nitroGLYCERIN  (NITROSTAT) 0.4 MG SL tablet Place 1 tablet (0.4 mg total) under the tongue every 5 (five) minutes as needed for chest pain.  Marland Kitchen  Omega-3 Fatty Acids (FISH OIL PO) Take 1 capsule by mouth daily.  Marland Kitchen omeprazole (PRILOSEC) 40 MG capsule Take 40 mg by mouth 2 (two) times daily.  . prasugrel (EFFIENT) 10 MG TABS tablet Take 10 mg by mouth daily.  . Probiotic Product (PROBIOTIC PO) Take 1 tablet by mouth daily.   No facility-administered encounter medications on file as of 07/29/2015.  :   Review of Systems:  Out of a complete 14 point review of systems, all are reviewed and negative with the exception of these symptoms as listed below:   Review of Systems  HENT: Positive for rhinorrhea.   Eyes:       Blurred vision   Cardiovascular:       Patient states that he had a stent placed about a year ago and since then his blood pressure reading have been lower.   Neurological: Positive for dizziness, tremors and weakness.       Patient reports that he started having vertigo about 2 weeks ago. Unsteady balance. Meclizine helps with the dizziness but reports that his knees are still weak.     Objective:  Neurologic Exam  Physical Exam Physical Examination:   Filed Vitals:   07/29/15 1132 07/29/15 1139  BP: 117/67 116/66  Pulse: 67 71  Resp: 18 18   Of note, he has no significant orthostatic blood pressure or pulse changes  General Examination: The patient is a very pleasant 74 y.o. male in no acute distress. He appears well-developed and well-nourished and very well groomed. He is mildly anxious appearing.   HEENT: Normocephalic, atraumatic, pupils are equal, round and reactive to light and accommodation. Funduscopic exam is normal with sharp disc margins noted. He is status post bilateral cataract repairs. Extraocular tracking is good without limitation to gaze excursion or nystagmus noted. Normal smooth pursuit is noted. He overall feels slightly unsteady but has no significant vertiginous  symptoms with head movements or head extension. He has an unremarkable scar from his left carotid endarterectomy. Hearing is grossly intact. Tympanic membranes are clear bilaterally. Face is symmetric with normal facial animation and normal facial sensation. Speech is clear with no dysarthria noted. There is no hypophonia. There is no lip, neck/head, jaw or voice tremor. Neck is supple with full range of passive and active motion. There are no carotid bruits on auscultation. Oropharynx exam reveals: mild mouth dryness, adequate dental hygiene and moderate airway crowding, due to larger tongue, redundant soft palate and large uvula. Mallampati is class II. Tongue protrudes centrally and palate elevates symmetrically.   Chest: Clear to auscultation without wheezing, rhonchi or crackles noted.  Heart: S1+S2+0, regular and normal without murmurs, rubs or gallops noted.   Abdomen: Soft, non-tender and non-distended with normal bowel sounds appreciated on auscultation.  Extremities: There is no pitting edema in the distal lower extremities bilaterally. Pedal pulses are intact.  Skin: Warm and dry without trophic changes noted. There are no varicose veins. He has a small area of erythema and raised rash on his right hand.    Musculoskeletal: exam reveals no obvious joint deformities, tenderness or joint swelling or erythema.   Neurologically:  Mental status: The patient is awake, alert and oriented in all 4 spheres. His immediate and remote memory, attention, language skills and fund of knowledge are appropriate. There is no evidence of aphasia, agnosia, apraxia or anomia. Speech is clear with normal prosody and enunciation. Thought process is linear. Mood is normal and affect is normal.  Cranial nerves II -  XII are as described above under HEENT exam. In addition: shoulder shrug is normal with equal shoulder height noted. Motor exam: Normal bulk, strength and tone is noted, with the exception of mild  atrophy noted in the intrinsic hand muscles and snuffbox. He has mild grip strength weakness bilaterally. There is no drift, tremor or rebound. Romberg is negative. Reflexes are 2+in the upper extremities and 1+ in the knees, absent in both ankles. Babinski: Toes are flexor bilaterally. Fine motor skills and coordination: intact with normal finger taps, normal hand movements, normal rapid alternating patting, normal foot taps and normal foot agility.  Cerebellar testing: No dysmetria or intention tremor on finger to nose testing. Heel to shin is unremarkable bilaterally. There is no truncal or gait ataxia.  Sensory exam: intact to light touch, pinprick, vibration, temperature sense in the upper extremitieswith decrease to all modalities in the lower extremities up to mid calf areas bilaterally.  Gait, station and balance: He stands slowly and with caution. He stands slightly wide-based. He walks cautiously. Tandem walk is not possible for him.  Assessment and Plan:   Assessment and Plan:  In summary, JADARIEN BRISK is a very pleasant 74 y.o.-year old male with an underlying complex medical history of diabetes, complicated by retinopathy, peripheral neuropathy, history of obstructive sleep apnea, COPD, coronary artery disease, status post stent placement, depression, obesity, hyperlipidemia, hypertension, carotid artery disease, status post left carotid endarterectomy in 2016, and osteoarthritis, who presents with new onset vertigo which started suddenly on 07/17/2015. He has improved in the last couple weeks. Nevertheless, he is not fully back to baseline. He has no focal neurological abnormality on exam and had imaging testing in the form of MRI brain without contrast recently as well as further workup with carotid Doppler studies, neck MRA, and echocardiogram. He most likely has positional vertigo. I think at this point he would benefit from vestibular rehabilitation made a referral to physical therapy in  that regard. He was agreeable. Furthermore, would like for him to be seen by ENT for completion. He is advised about potential recurrence of vertigo symptoms. He is also advised that there is no specific treatment for this in the form of medication but sometimes meclizine as needed can help. Nevertheless, meclizine can be quite sedating and may contribute to gait difficulties. His gait disturbance is most likely a combination of things including exacerbation secondary to medication effect, and peripheral neuropathy at baseline. I suggested the patient stay well-hydrated, change positions slowly, and use a cane for safety for now. He is advised to use caution when driving. He may not be fully safe to drive quite yet. He and his wife are in agreement. I would like to see him back routinely in about 3 months or so, sooner if needed. I answered all her questions today and the patient and his wife were in agreement.  Thank you very much for allowing me to participate in the care of this nice patient. If I can be of any further assistance to you please do not hesitate to call me at [phone].  Sincerely,   Star Age, MD, PhD

## 2015-07-31 ENCOUNTER — Ambulatory Visit: Payer: Medicare Other | Attending: Neurology | Admitting: Physical Therapy

## 2015-07-31 ENCOUNTER — Encounter: Payer: Self-pay | Admitting: Physical Therapy

## 2015-07-31 ENCOUNTER — Other Ambulatory Visit: Payer: Self-pay | Admitting: Physician Assistant

## 2015-07-31 DIAGNOSIS — H8110 Benign paroxysmal vertigo, unspecified ear: Secondary | ICD-10-CM | POA: Insufficient documentation

## 2015-07-31 DIAGNOSIS — H8111 Benign paroxysmal vertigo, right ear: Secondary | ICD-10-CM | POA: Diagnosis present

## 2015-07-31 DIAGNOSIS — R42 Dizziness and giddiness: Secondary | ICD-10-CM | POA: Diagnosis present

## 2015-07-31 NOTE — Therapy (Signed)
[facility] [address] [place], [place], [address] Phone: [phone]   Fax:  [phone]  Physical Therapy Evaluation  Patient Details  Name: Seth Young MRN: [id number] Date of Birth: 01/11/1941 Referring Provider: Star Age, MD, PhD  Encounter Date: 07/31/2015      PT End of Session - 07/31/15 1041    Visit Number 1   Number of Visits 9   Date for PT Re-Evaluation 08/30/15   Authorization Type BCBS Medicare - G Codes required   PT Start Time 0940   PT Stop Time 1035   PT Time Calculation (min) 55 min   Activity Tolerance Patient tolerated treatment well   Behavior During Therapy [facility] for tasks assessed/performed      Past Medical History  Diagnosis Date  . Hypertension   . Colon polyps 10-12-11    past hx.  . Exertional angina ([place]) 06/26/2014    Cath with DES to the OM, Bioflow protocol   . High cholesterol   . Pneumonia 1940's X 2  . Type II diabetes mellitus ([place]) dx'd 1982    nsulin started ~ 2000  . H/O hiatal hernia     no problems  . GERD (gastroesophageal reflux disease)   . Arthritis     "knees, toes, hands" (06/26/2014)  . Pseudogout     "it moves around"  . Ischemic chest pain ([place]) 08/07/2014  . Sleep apnea     no cpap used  . Basal cell carcinoma     left temporal area-no residual  . Peripheral neuropathy ([place])   . Occasional tremors     Bilateral hands  . Vertigo   . Ataxia     Past Surgical History  Procedure Laterality Date  . Back surgery    . Nasal septum surgery  1979  . Carpal tunnel release Left 10/2009  . Knee arthroscopy Left 05/2011    10'12-left knee  . Shoulder open rotator cuff repair  10/14/2011    Procedure: ROTATOR CUFF REPAIR SHOULDER OPEN;  Surgeon: Johnn Hai, MD;  Location: [facility];  Service: Orthopedics;  Laterality: Right;  . Subacromial decompression  10/14/2011    Procedure: SUBACROMIAL DECOMPRESSION;  Surgeon: Johnn Hai, MD;  Location: [facility];  Service: Orthopedics;  Laterality: Right;  . Cardiac catheterization  02/2005    Dr. Mare Ferrari; negative.  . Coronary angioplasty with stent placement  06/26/2014    "1"  . Posterior lumbar fusion  1964  . Carpal tunnel release Right ~ 2013  . Cataract extraction w/ intraocular lens implant Left 2000's  . Cataract extraction w/ intraocular lens implant Right 2015  . Elbow surgery Bilateral ~ 2014    "for blockages; Dr. Amedeo Plenty"  . Trigger finger release Right ~ 2013-2014    "3rd & 4th digits"  . Basal cell carcinoma excision Left ~ 2012    face  . Coronary angioplasty with stent placement  06/26/2014    pLAD 50%, d LAD 60%, oD1 80%,  mD1  70%, CFX 50%, OM 2 70%,  RCA 80%, PDA 95% (<32mm), OM1 99%-0% with Bio flow stent       . Left heart catheterization with coronary angiogram N/A 06/26/2014    Procedure: LEFT HEART CATHETERIZATION WITH CORONARY ANGIOGRAM;  Surgeon: Blane Ohara, MD;  Location: [facility] CATH LAB;  Service: Cardiovascular;  Laterality: N/A;  . Left heart catheterization with coronary angiogram N/A 08/07/2014    Procedure: LEFT HEART CATHETERIZATION WITH CORONARY ANGIOGRAM;  Surgeon: Blane Ohara, MD;  Location: [facility] CATH LAB;  Service: Cardiovascular;  Laterality: N/A;  . Colonoscopy w/ polypectomy    . Endarterectomy Left 04/23/2015    Procedure: ENDARTERECTOMY CAROTID;  Surgeon: Conrad Burton, MD;  Location: [facility];  Service: Vascular;  Laterality: Left;  . Patch angioplasty Left 04/23/2015    Procedure: PATCH ANGIOPLASTY USING 1CM X 6CM XENOSURE BIOLOGIC PATCH;  Surgeon: Conrad Palmer, MD;  Location: [facility];  Service: Vascular;  Laterality: Left;  . Carotid endarterectomy Left Ssept. 14, 2016    CEA    There were no vitals filed for this visit.  Visit Diagnosis:  Benign paroxysmal positional vertigo, unspecified laterality - Plan: PT plan of care cert/re-cert  Dizziness and giddiness - Plan: PT plan of care cert/re-cert      Subjective Assessment - 07/31/15 0945     Subjective "I had vertigo (12/8) the day after I had an examination on my eye. The vertigo went away, but now I'm walking off-balance. I had vertigo once before 2 years ago, but it kind of went away. The biggest probel I have is in the mornings."   Patient is accompained by: Family member  wife, Seth Young   Pertinent History Goes by "Seth Young". PMH: diabetes, complicated by retinopathy, peripheral neuropathy, history of obstructive sleep apnea, coronary artery disease, status post stent placement, depression, obesity, hyperlipidemia, hypertension, carotid artery disease, status post left carotid endarterectomy in 2016, and osteoarthritis   Patient Stated Goals "Stop whatever is going on and maybe learn how to manage it if it happens in the future."   Currently in Pain? No/denies            [facility] PT Assessment - 07/31/15 0001    Assessment   Medical Diagnosis Positional vertigo; gait disorder   Referring Provider Star Age, MD, PhD   Onset Date/Surgical Date 07/17/15   Precautions   Precautions Fall   Restrictions   Weight Bearing Restrictions No   Balance Screen   Has the patient fallen in the past 6 months No   Has the patient had a decrease in activity level because of a fear of falling?  Yes   Is the patient reluctant to leave their home because of a fear of falling?  No   Home Environment   Living Environment Private residence   Living Arrangements Spouse/significant other   Type of [place] to enter   Entrance Stairs-Number of Steps 4   Entrance Stairs-Rails Cannot reach both   [place] One level   [place] - single point   Additional Comments Pt has began using cane recently since onset fo vertigo.   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   Leisure swimming, walking, stationary bike   Cognition   Overall Cognitive Status  Within Functional Limits for tasks assessed            Vestibular Assessment - 07/31/15 0001    Symptom Behavior   Type of Dizziness Spinning  and off-balance   Frequency of Dizziness every morning   Duration of Dizziness or until I changes positions   Aggravating Factors Lying supine;Sitting with head tilted back   Relieving Factors Comments  sitting back up in bed   Occulomotor Exam   Occulomotor Alignment Abnormal  L eye slightly abducted at times   Spontaneous  Absent   Gaze-induced Right beating nystagmus with R gaze   Smooth Pursuits Intact   Saccades Comment  undershooting with horizontal saccades   Comment (+) R Head Thrust Test   Positional Testing   Dix-Hallpike Dix-Hallpike Right;Dix-Hallpike Left   Sidelying Test --   Horizontal Canal Testing Horizontal Canal Right;Horizontal Canal Left   Dix-Hallpike Right   Dix-Hallpike Right Duration Trial 1: persistent downbeating, torsional nystagmus (appears to be toward L side; however,difficult to visualize). Trial 2 (after R Epley x1): R upbeating, torsional nystagmus x45 seconds. Trial 3 (after second R Epley maneuver): R upbeating torsional nystagmus x20 seconds.   Dix-Hallpike Right Symptoms Upbeat, right rotatory nystagmus;Downbeat, left rotatory nystagmus;Other (comment)  See above.   Horizontal Canal Right   Horizontal Canal Right Duration NA   Horizontal Canal Right Symptoms Normal   Horizontal Canal Left   Horizontal Canal Left Duration NA   Horizontal Canal Left Symptoms Normal                Vestibular Treatment/Exercise - 07/31/15 0001    Vestibular Treatment/Exercise   Vestibular Treatment Provided Canalith Repositioning   Canalith Repositioning Epley Manuever Right    EPLEY MANUEVER RIGHT   Number of Reps  3   Overall Response Improved Symptoms   Response Details  After initial Epley, torsional component of nystagmus changes (in R [employer]) from downbeating to R upbeating. See Vestibular  Assessment section for further detail. Pt reported less disequilibrium upon standing, walking after Epley maneuver performed x3 trials.                 PT Short Term Goals - 07/31/15 1055    PT SHORT TERM GOAL #1   Title STG's = LTG's           PT Long Term Goals - 07/31/15 1055    PT LONG TERM GOAL #1   Title Positional vertigo testing will be negative to indicate resolved BPPV.  Target date: 08/28/15   PT LONG TERM GOAL #2   Title Pt will decrease DHI score from 38 to < / = 20 to indicate significant decrease in pt-perceived disability due to dizziness. Target date: 08/28/15   PT LONG TERM GOAL #3   Title Assess strength and balance, if indicated, after vertigo clears. Target date: 08/28/15               Plan - 07/31/15 1050    Clinical Impression Statement Pt is a 74 y/o M referred to outpatient neuro PT to address vertigo and gait instability. PT evaluation revealed the following: (+) R Head Thrust Test; R gaze-evoked nystagmus; R Dix-Hallpike Test initially with L downbeating, torsional nystagmus; after R Epley Maneuver x1 trial, noted R upbeating nystagmus on R Dix-Hallpike. Performed R Epley maneuver x3 trials with resolve of downbeating nystagmus but ongoing R upbeating nystagmus x20 seconds. Pt noted significant improvement in symptoms post-session. Pt will benefit from skilled outpatient PT 2x/week for up to 4 weeks, as needed, to address BPPV and disequilibrium.    Pt will benefit from skilled therapeutic intervention in order to improve on the following deficits Dizziness;Abnormal gait   Rehab Potential Good   PT Frequency 2x / week   PT Duration 4 weeks   PT Treatment/Interventions ADLs/Self Care Home Management;Canalith Repostioning;Gait training;Stair training;Functional mobility training;Therapeutic activities;Therapeutic exercise;Balance training;Vestibular;Neuromuscular re-education;Patient/family education   PT Next Visit Plan Retest R Dix-Hallpike and  treat accordingly.   Consulted and Agree with Plan of Care Patient;Family member/caregiver  Family Member Consulted wife, Bertrum Sol - August 29, 2015 1220    Functional Assessment Tool Used DHI 38   Functional Limitation Self care   Self Care Current Status [id number]) At least 20 percent but less than 40 percent impaired, limited or restricted   Self Care Goal Status [id number]) At least 1 percent but less than 20 percent impaired, limited or restricted       Problem List Patient Active Problem List   Diagnosis Date Noted  . Carotid artery disease without cerebral infarction ([place]) 04/09/2015  . Ischemic chest pain ([place]) 08/07/2014  . Exertional angina ([place]) 06/26/2014  . Chest pain of uncertain etiology [id number]  . Diabetic neuropathy ([place]) 06/21/2014  . Essential hypertension 06/21/2014  . Hypercholesterolemia 06/21/2014  . Rotator cuff tear 10/15/2011  . IDDM (insulin dependent diabetes mellitus) ([place]) 10/16/2008    Billie Ruddy, PT, DPT [facility] [address] [place], [place], [address] Phone: [phone]   Fax:  [phone] 08-29-2015, 12:22 PM   Name: Seth Young MRN: [id number] Date of Birth: [birth year]

## 2015-08-01 ENCOUNTER — Ambulatory Visit: Payer: Medicare Other | Admitting: Physical Therapy

## 2015-08-01 DIAGNOSIS — H8110 Benign paroxysmal vertigo, unspecified ear: Secondary | ICD-10-CM | POA: Diagnosis not present

## 2015-08-01 DIAGNOSIS — H8111 Benign paroxysmal vertigo, right ear: Secondary | ICD-10-CM

## 2015-08-01 DIAGNOSIS — R42 Dizziness and giddiness: Secondary | ICD-10-CM

## 2015-08-01 NOTE — Patient Instructions (Signed)
Tip Card 1.The goal of habituation training is to assist in decreasing symptoms of vertigo, dizziness, or nausea provoked by specific head and body motions. 2.These exercises may initially increase symptoms; however, be persistent and work through symptoms. With repetition and time, the exercises will assist in reducing or eliminating symptoms. 3.Exercises should be stopped and discussed with the therapist if you experience any of the following: - Sudden change or fluctuation in hearing - New onset of ringing in the ears, or increase in current intensity - Any fluid discharge from the ear - Severe pain in neck or back - Extreme nausea   Sit to Side-Lying   Sit on edge of bed. Lie down onto the right side and hold until dizziness stops, plus 20 seconds.  Return to sitting and wait until dizziness stops, plus 20 seconds.  Repeat to the left side. Repeat sequence 5 times per session. Do 2 sessions per day. 

## 2015-08-01 NOTE — Therapy (Signed)
[facility] [address] [place], [place], [address] Phone: [phone]   Fax:  [phone]  Physical Therapy Treatment  Patient Details  Name: Seth Young MRN: [id number] Date of Birth: [birth year] Referring Provider: Star Age, MD, PhD  Encounter Date: 08/01/2015      PT End of Session - 08/01/15 1053    Visit Number 2   Number of Visits 9   Date for PT Re-Evaluation 08/30/15   Authorization Type BCBS Medicare - G Codes required   PT Start Time 0802   PT Stop Time 0852   PT Time Calculation (min) 50 min   Activity Tolerance Other (comment)  Limited by disequilibrium and postural instability post-session   Behavior During Therapy [facility] for tasks assessed/performed      Past Medical History  Diagnosis Date  . Hypertension   . Colon polyps 10-12-11    past hx.  . Exertional angina ([place]) 06/26/2014    Cath with DES to the OM, Bioflow protocol   . High cholesterol   . Pneumonia 1940's X 2  . Type II diabetes mellitus ([place]) dx'd 1982    nsulin started ~ 2000  . H/O hiatal hernia     no problems  . GERD (gastroesophageal reflux disease)   . Arthritis     "knees, toes, hands" (06/26/2014)  . Pseudogout     "it moves around"  . Ischemic chest pain ([place]) 08/07/2014  . Sleep apnea     no cpap used  . Basal cell carcinoma     left temporal area-no residual  . Peripheral neuropathy ([place])   . Occasional tremors     Bilateral hands  . Vertigo   . Ataxia     Past Surgical History  Procedure Laterality Date  . Back surgery    . Nasal septum surgery  1979  . Carpal tunnel release Left 10/2009  . Knee arthroscopy Left 05/2011    10'12-left knee  . Shoulder open rotator cuff repair  10/14/2011    Procedure: ROTATOR CUFF REPAIR SHOULDER OPEN;  Surgeon: Johnn Hai, MD;  [facility]: [facility];  Service: Orthopedics;  Laterality: Right;  . Subacromial decompression  10/14/2011    Procedure: SUBACROMIAL  DECOMPRESSION;  Surgeon: Johnn Hai, MD;  [facility];  Service: Orthopedics;  Laterality: Right;  . Cardiac catheterization  02/2005    Dr. Mare Ferrari; negative.  . Coronary angioplasty with stent placement  06/26/2014    "1"  . Posterior lumbar fusion  1964  . Carpal tunnel release Right ~ 2013  . Cataract extraction w/ intraocular lens implant Left 2000's  . Cataract extraction w/ intraocular lens implant Right 2015  . Elbow surgery Bilateral ~ 2014    "for blockages; Dr. Amedeo Plenty"  . Trigger finger release Right ~ 2013-2014    "3rd & 4th digits"  . Basal cell carcinoma excision Left ~ 2012    face  . Coronary angioplasty with stent placement  06/26/2014    pLAD 50%, d LAD 60%, oD1 80%,  mD1  70%, CFX 50%, OM 2 70%,  RCA 80%, PDA 95% (<77mm), OM1 99%-0% with Bio flow stent       . Left heart catheterization with coronary angiogram N/A 06/26/2014    Procedure: LEFT HEART CATHETERIZATION WITH CORONARY ANGIOGRAM;  Surgeon: Blane Ohara, MD;  [facility] CATH LAB;  Service: Cardiovascular;  Laterality: N/A;  . Left heart catheterization with coronary angiogram N/A 08/07/2014    Procedure: LEFT  HEART CATHETERIZATION WITH CORONARY ANGIOGRAM;  Surgeon: Blane Ohara, MD;  Location: [facility] CATH LAB;  Service: Cardiovascular;  Laterality: N/A;  . Colonoscopy w/ polypectomy    . Endarterectomy Left 04/23/2015    Procedure: ENDARTERECTOMY CAROTID;  Surgeon: Conrad Bolton, MD;  Location: [facility];  Service: Vascular;  Laterality: Left;  . Patch angioplasty Left 04/23/2015    Procedure: PATCH ANGIOPLASTY USING 1CM X 6CM XENOSURE BIOLOGIC PATCH;  Surgeon: Conrad , MD;  Location: [facility];  Service: Vascular;  Laterality: Left;  . Carotid endarterectomy Left Ssept. 14, 2016    CEA    There were no vitals filed for this visit.  Visit Diagnosis:  BPPV (benign paroxysmal positional vertigo), right  Dizziness and giddiness      Subjective Assessment - 08/01/15 0805    Subjective Pt  states, "I felt fine until I got out of bed this morning. I rolled over onto my right side, stayed there for a while, sat up, and felt a little woozy." Post-session, pt reported improvement in dizziness following R Epley x3, but reported feeling "much worse" after [name] and orthostatic vital signs. Also, pt's wife reported elevated blood glucose this morning.   Patient is accompained by: Family member  wife, [name]   Pertinent History Goes by "[name]". PMH: diabetes, complicated by retinopathy, peripheral neuropathy, history of obstructive sleep apnea, coronary artery disease, status post stent placement, depression, obesity, hyperlipidemia, hypertension, carotid artery disease, status post left carotid endarterectomy in 2016, and osteoarthritis   Patient Stated Goals "Stop whatever is going on and maybe learn how to manage it if it happens in the future."   Currently in Pain? No/denies                Vestibular Assessment - 08/01/15 0001    Positional Testing   Dix-Hallpike Dix-Hallpike Right   Dix-Hallpike Right   Dix-Hallpike Right Symptoms Upbeat, right rotatory nystagmus   Dix-Hallpike Left   Dix-Hallpike Left Duration 50 seconds   Orthostatics   BP supine (x 5 minutes) 126/68 mmHg   HR supine (x 5 minutes) 72   BP standing (after 1 minute) 138/69 mmHg  significant postural instability; "lightheaded, spinning"   HR standing (after 1 minute) 75   BP standing (after 3 minutes) 142/70 mmHg   HR standing (after 3 minutes) 74                  Vestibular Treatment/Exercise - 08/01/15 0001    Vestibular Treatment/Exercise   Vestibular Treatment Provided Canalith Repositioning;Habituation   Habituation Exercises Brandt Daroff    EPLEY MANUEVER RIGHT   Number of Reps  3   Overall Response Improved Symptoms   Response Details  After R Epley x2 trials, noted ongoing R upbeating torsional nystagmus x25 seconds in R Dix-Hallpike position.   [name]   Number of  Reps  2   Symptom Description  Mild symptoms with L [name]. R [name]:: 5/10 dizzines (and R upbeting, torsional nystagmus ~20 seconds); 1/10 dizziness during subsequent trial/               PT Education - 08/01/15 1050    Education provided Yes   Education Details HEP: Brandt-Daroff for habituation.   Person(s) Educated Patient;Spouse   Methods Explanation;Demonstration;Verbal cues;Handout   Comprehension Verbalized understanding;Returned demonstration          PT Short Term Goals - 07/31/15 1055    PT SHORT TERM GOAL #1   Title STG's =  LTG's           PT Long Term Goals - August 25, 2015 1055    PT LONG TERM GOAL #1   Title Positional vertigo testing will be negative to indicate resolved BPPV.  Target date: 08/28/15   PT LONG TERM GOAL #2   Title Pt will decrease DHI score from 38 to < / = 20 to indicate significant decrease in pt-perceived disability due to dizziness. Target date: 08/28/15   PT LONG TERM GOAL #3   Title Assess strength and balance, if indicated, after vertigo clears. Target date: 08/28/15               Plan - 08/01/15 1055    Clinical Impression Statement Skilled session focused on continuing to assess/address BPPV. Noted no downbeating nystagmus during this session. In R Dix-Hallpike, noted R upbeating torsional nystagmus x50 seconds approximately. Performed R Epley x3 trials with improved symptoms; however, reassessment of R sidelying test continued to reveal R upbeating torsional nystagmus of shorter duration (< 30 seconds). Educated pt on Brandt-Daroff for habituation. Also, ruled out orthotstaic hypotension as origin of lightheadedness (see orthostatic vital signs for details). Pt needed to remainf seated for > 20 minutes after session due to decreased postural stability and increased disequilibrium with standing/movement after [name] and orthostatic vital signs. Pt left clinic accompanied by wife in no apparent distress.   Pt will  benefit from skilled therapeutic intervention in order to improve on the following deficits Dizziness;Abnormal gait   Rehab Potential Good   PT Frequency 2x / week   PT Duration 4 weeks   PT Treatment/Interventions ADLs/Self Care Home Management;Canalith Repostioning;Gait training;Stair training;Functional mobility training;Therapeutic activities;Therapeutic exercise;Balance training;Vestibular;Neuromuscular re-education;Patient/family education   PT Next Visit Plan Ask about symptoms. Retest for BPPV.   Consulted and Agree with Plan of Care Patient;Family member/caregiver   Family Member Consulted wife, Bertrum Sol - 08-25-15 1220    Functional Assessment Tool Used DHI 38   Functional Limitation Self care   Self Care Current Status [phone]) At least 20 percent but less than 40 percent impaired, limited or restricted   Self Care Goal Status [id number]) At least 1 percent but less than 20 percent impaired, limited or restricted      Problem List Patient Active Problem List   Diagnosis Date Noted  . Carotid artery disease without cerebral infarction ([place]) 04/09/2015  . Ischemic chest pain ([place]) 08/07/2014  . Exertional angina ([place]) 06/26/2014  . Chest pain of uncertain etiology [id number]  . Diabetic neuropathy ([place]) 06/21/2014  . Essential hypertension 06/21/2014  . Hypercholesterolemia 06/21/2014  . Rotator cuff tear 10/15/2011  . IDDM (insulin dependent diabetes mellitus) ([place]) 10/16/2008   Billie Ruddy, PT, DPT [facility] [address] [place], [place], [address] Phone: [phone]   Fax:  [phone] 08/01/2015, 11:03 AM   Name: SHAYE CHUSTZ MRN: [id number] Date of Birth: 02/25/1941

## 2015-08-06 ENCOUNTER — Ambulatory Visit: Payer: Medicare Other | Admitting: Physical Therapy

## 2015-08-06 DIAGNOSIS — H8110 Benign paroxysmal vertigo, unspecified ear: Secondary | ICD-10-CM | POA: Diagnosis not present

## 2015-08-06 DIAGNOSIS — R42 Dizziness and giddiness: Secondary | ICD-10-CM

## 2015-08-06 NOTE — Patient Instructions (Signed)
Tip Card 1.The goal of habituation training is to assist in decreasing symptoms of vertigo, dizziness, or nausea provoked by specific head and body motions. 2.These exercises may initially increase symptoms; however, be persistent and work through symptoms. With repetition and time, the exercises will assist in reducing or eliminating symptoms. 3.Exercises should be stopped and discussed with the therapist if you experience any of the following: - Sudden change or fluctuation in hearing - New onset of ringing in the ears, or increase in current intensity - Any fluid discharge from the ear - Severe pain in neck or back - Extreme nausea   Sit to Side-Lying   Sit on edge of bed. Lie down onto the right side and hold until dizziness stops, plus 20 seconds.  Return to sitting and wait until dizziness stops, plus 20 seconds.  Repeat to the left side. Repeat sequence 5 times per session. Do 2 sessions per day. 

## 2015-08-06 NOTE — Therapy (Signed)
[facility] [address] [place], [place], [address] Phone: [phone]   Fax:  [phone]  Physical Therapy Treatment  Patient Details  Name: Seth Young MRN: [id number] Date of Birth: [birth year] Referring Provider: Star Age, MD, PhD  Encounter Date: 08/06/2015      PT End of Session - 08/06/15 1041    Visit Number 3   Number of Visits 9   Date for PT Re-Evaluation 08/30/15   Authorization Type BCBS Medicare - G Codes required   PT Start Time 0849   PT Stop Time 0934   PT Time Calculation (min) 45 min   Activity Tolerance Patient tolerated treatment well   Behavior During Therapy [facility] for tasks assessed/performed      Past Medical History  Diagnosis Date  . Hypertension   . Colon polyps 10-12-11    past hx.  . Exertional angina ([place]) 06/26/2014    Cath with DES to the OM, Bioflow protocol   . High cholesterol   . Pneumonia 1940's X 2  . Type II diabetes mellitus ([place]) dx'd 1982    nsulin started ~ 2000  . H/O hiatal hernia     no problems  . GERD (gastroesophageal reflux disease)   . Arthritis     "knees, toes, hands" (06/26/2014)  . Pseudogout     "it moves around"  . Ischemic chest pain ([place]) 08/07/2014  . Sleep apnea     no cpap used  . Basal cell carcinoma     left temporal area-no residual  . Peripheral neuropathy ([place])   . Occasional tremors     Bilateral hands  . Vertigo   . Ataxia     Past Surgical History  Procedure Laterality Date  . Back surgery    . Nasal septum surgery  1979  . Carpal tunnel release Left 10/2009  . Knee arthroscopy Left 05/2011    10'12-left knee  . Shoulder open rotator cuff repair  10/14/2011    Procedure: ROTATOR CUFF REPAIR SHOULDER OPEN;  Surgeon: Johnn Hai, MD;  Location: [facility];  Service: Orthopedics;  Laterality: Right;  . Subacromial decompression  10/14/2011    Procedure: SUBACROMIAL DECOMPRESSION;  Surgeon: Johnn Hai, MD;  Location: [facility];  Service: Orthopedics;  Laterality: Right;  . Cardiac catheterization  02/2005    Dr. Mare Ferrari; negative.  . Coronary angioplasty with stent placement  06/26/2014    "1"  . Posterior lumbar fusion  1964  . Carpal tunnel release Right ~ 2013  . Cataract extraction w/ intraocular lens implant Left 2000's  . Cataract extraction w/ intraocular lens implant Right 2015  . Elbow surgery Bilateral ~ 2014    "for blockages; Dr. Amedeo Plenty"  . Trigger finger release Right ~ 2013-2014    "3rd & 4th digits"  . Basal cell carcinoma excision Left ~ 2012    face  . Coronary angioplasty with stent placement  06/26/2014    pLAD 50%, d LAD 60%, oD1 80%,  mD1  70%, CFX 50%, OM 2 70%,  RCA 80%, PDA 95% (<24mm), OM1 99%-0% with Bio flow stent       . Left heart catheterization with coronary angiogram N/A 06/26/2014    Procedure: LEFT HEART CATHETERIZATION WITH CORONARY ANGIOGRAM;  Surgeon: Blane Ohara, MD;  Location: [facility] CATH LAB;  Service: Cardiovascular;  Laterality: N/A;  . Left heart catheterization with coronary angiogram N/A 08/07/2014    Procedure: LEFT HEART CATHETERIZATION WITH CORONARY ANGIOGRAM;  Surgeon: Blane Ohara, MD;  Location: [facility] CATH LAB;  Service: Cardiovascular;  Laterality: N/A;  . Colonoscopy w/ polypectomy    . Endarterectomy Left 04/23/2015    Procedure: ENDARTERECTOMY CAROTID;  Surgeon: Conrad [place], MD;  Location: [facility];  Service: Vascular;  Laterality: Left;  . Patch angioplasty Left 04/23/2015    Procedure: PATCH ANGIOPLASTY USING 1CM X 6CM XENOSURE BIOLOGIC PATCH;  Surgeon: Conrad [place], MD;  Location: [facility];  Service: Vascular;  Laterality: Left;  . Carotid endarterectomy Left Ssept. 14, 2016    CEA    There were no vitals filed for this visit.  Visit Diagnosis:  Benign paroxysmal positional vertigo, unspecified laterality  Dizziness and giddiness      Subjective Assessment - 08/06/15 0851    Subjective "I feel much better. This position here (looking up toward  ceiling) is still a little bit woozy." When pt/wife demonstrated home exercise they had been performing, noted they have been performing R Epley Maneuver rather than [employer] habituation.   Patient is accompained by: Family member  wife, [name]   Pertinent History Goes by "[name]". PMH: diabetes, complicated by retinopathy, peripheral neuropathy, history of obstructive sleep apnea, coronary artery disease, status post stent placement, depression, obesity, hyperlipidemia, hypertension, carotid artery disease, status post left carotid endarterectomy in 2016, and osteoarthritis   Patient Stated Goals "Stop whatever is going on and maybe learn how to manage it if it happens in the future."   Currently in Pain? No/denies                Vestibular Assessment - 08/06/15 0001    Positional Testing   Dix-Hallpike Dix-Hallpike Left   Sidelying Test Sidelying Right;Sidelying Left   Dix-Hallpike Left   Dix-Hallpike Left Duration Approx. 15 seconds (~3-5 second latency)  Performed after L Semont Maneuver   Dix-Hallpike Left Symptoms Upbeat, left rotatory nystagmus   Sidelying Right   Sidelying Right Duration NA   Sidelying Right Symptoms No nystagmus   Sidelying Left   Sidelying Left Duration spinning > 60 seconds  > 60 seconds (performed before L Semont Maneuver)   Sidelying Left Symptoms No nystagmus                  Vestibular Treatment/Exercise - 08/06/15 0001    Vestibular Treatment/Exercise   Vestibular Treatment Provided Canalith Repositioning   Canalith Repositioning Semont Procedure Left Posterior;Epley Manuever Left    EPLEY MANUEVER LEFT   Number of Reps  1   Overall Response  Improved Symptoms    RESPONSE DETAILS LEFT After L Semont Maneuver, performed L Epley due to (+) L Dix Hallpike with ~3-second latency and L upbeating torsional nystagmus accompanied by vertiginous symptoms for approx. 15 seconds.   Semont Procedure Left Posterior   Number of Reps  1    Overall Response Improved Symptoms   [name]   Number of Reps  2  prior to canolith repositioning maneuvers   Symptom Description  In L Brandt Daroff position (L Sidelying test): noted L upbeating torsional nystagmus > 60 seconds duration               PT Education - 08/06/15 1029    Education provided Yes   Education Details HEP: practiced [employer] habituation. Recommended pt/wife not perform Epley at home.   Person(s) Educated Patient;Spouse   Methods Explanation;Demonstration;Verbal cues;Handout   Comprehension Verbalized understanding;Returned demonstration          PT Short Term Goals -  07/31/15 1055    PT SHORT TERM GOAL #1   Title STG's = LTG's           PT Long Term Goals - 07/31/15 1055    PT LONG TERM GOAL #1   Title Positional vertigo testing will be negative to indicate resolved BPPV.  Target date: 08/28/15   PT LONG TERM GOAL #2   Title Pt will decrease DHI score from 38 to < / = 20 to indicate significant decrease in pt-perceived disability due to dizziness. Target date: 08/28/15   PT LONG TERM GOAL #3   Title Assess strength and balance, if indicated, after vertigo clears. Target date: 08/28/15               Plan - 08/06/15 1045    Clinical Impression Statement Session focused on retesting BPPV and addressing accordingly. Noted pt/wife have been attempting to perform L Epley Maneuver (incorrectly) at home despite education for [employer] habituation. Re-educated pt/wife on habituation with effective return demo from pt/wife. Upon retesting for BPPV, noted no nystagmus or vertiginous symptoms with R Sidelying Test. With L Sidelying Test, however, noted L upbeating torsional nystagmus accompanied by vertiginous symptoms > 60 seconds without latency. Therefore, performed Semont Maneuver for L Posterior Canal to address signs/symptoms of L posterior cupulolithiasis. Upon reassessing L Dix-Hallpike, noted L upbeating torsional nystagmus for  approx. 15 seconds with 3-5 second latency. Performed L Epley Manuever with resolve in symptoms, with some "lightheadedness" and "wooziness" only when looking upward.    Pt will benefit from skilled therapeutic intervention in order to improve on the following deficits Dizziness;Abnormal gait   Rehab Potential Good   PT Frequency 2x / week   PT Duration 4 weeks   PT Treatment/Interventions ADLs/Self Care Home Management;Canalith Repostioning;Gait training;Stair training;Functional mobility training;Therapeutic activities;Therapeutic exercise;Balance training;Vestibular;Neuromuscular re-education;Patient/family education   PT Next Visit Plan Retest for BPPV and treat accordingly.   Consulted and Agree with Plan of Care Patient;Family member/caregiver   Family Member Consulted wife, [name]        Problem List Patient Active Problem List   Diagnosis Date Noted  . Carotid artery disease without cerebral infarction ([place]) 04/09/2015  . Ischemic chest pain ([place]) 08/07/2014  . Exertional angina ([place]) 06/26/2014  . Chest pain of uncertain etiology 99991111  . Diabetic neuropathy ([place]) 06/21/2014  . Essential hypertension 06/21/2014  . Hypercholesterolemia 06/21/2014  . Rotator cuff tear 10/15/2011  . IDDM (insulin dependent diabetes mellitus) ([place]) 10/16/2008   Billie Ruddy, PT, DPT [facility] [address] [place], [place], [address] Phone: [phone]   Fax:  [phone] 08/06/2015, 10:51 AM  Name: BISMARK STEVESON MRN: [id number] Date of Birth: [birth year]

## 2015-08-07 ENCOUNTER — Ambulatory Visit: Payer: Medicare Other | Admitting: Physical Therapy

## 2015-08-07 DIAGNOSIS — R42 Dizziness and giddiness: Secondary | ICD-10-CM

## 2015-08-07 DIAGNOSIS — H8110 Benign paroxysmal vertigo, unspecified ear: Secondary | ICD-10-CM | POA: Diagnosis not present

## 2015-08-07 DIAGNOSIS — H8111 Benign paroxysmal vertigo, right ear: Secondary | ICD-10-CM

## 2015-08-07 NOTE — Therapy (Signed)
[facility] [address] [place], [place], [address] Phone: [phone]   Fax:  [phone]  Physical Therapy Treatment  Patient Details  Name: Seth Young MRN: [id number] Date of Birth: April 29, 1941 Referring Provider: Star Age, MD, PhD  Encounter Date: 08/07/2015      PT End of Session - 08/07/15 1247    Visit Number 5   Number of Visits 9   Date for PT Re-Evaluation 08/30/15   Authorization Type BCBS Medicare - G Codes required   PT Start Time 0802   PT Stop Time 0826   PT Time Calculation (min) 24 min   Activity Tolerance Patient tolerated treatment well   Behavior During Therapy [facility] for tasks assessed/performed      Past Medical History  Diagnosis Date  . Hypertension   . Colon polyps 10-12-11    past hx.  . Exertional angina ([place]) 06/26/2014    Cath with DES to the OM, Bioflow protocol   . High cholesterol   . Pneumonia 1940's X 2  . Type II diabetes mellitus ([place]) dx'd 1982    nsulin started ~ 2000  . H/O hiatal hernia     no problems  . GERD (gastroesophageal reflux disease)   . Arthritis     "knees, toes, hands" (06/26/2014)  . Pseudogout     "it moves around"  . Ischemic chest pain ([place]) 08/07/2014  . Sleep apnea     no cpap used  . Basal cell carcinoma     left temporal area-no residual  . Peripheral neuropathy ([place])   . Occasional tremors     Bilateral hands  . Vertigo   . Ataxia     Past Surgical History  Procedure Laterality Date  . Back surgery    . Nasal septum surgery  1979  . Carpal tunnel release Left 10/2009  . Knee arthroscopy Left 05/2011    10'12-left knee  . Shoulder open rotator cuff repair  10/14/2011    Procedure: ROTATOR CUFF REPAIR SHOULDER OPEN;  Surgeon: Johnn Hai, MD;  [facility]: [facility];  Service: Orthopedics;  Laterality: Right;  . Subacromial decompression  10/14/2011    Procedure: SUBACROMIAL DECOMPRESSION;  Surgeon: Johnn Hai, MD;  [facility]: [facility];  Service: Orthopedics;  Laterality: Right;  . Cardiac catheterization  02/2005    Dr. Mare Ferrari; negative.  . Coronary angioplasty with stent placement  06/26/2014    "1"  . Posterior lumbar fusion  1964  . Carpal tunnel release Right ~ 2013  . Cataract extraction w/ intraocular lens implant Left 2000's  . Cataract extraction w/ intraocular lens implant Right 2015  . Elbow surgery Bilateral ~ 2014    "for blockages; Dr. Amedeo Plenty"  . Trigger finger release Right ~ 2013-2014    "3rd & 4th digits"  . Basal cell carcinoma excision Left ~ 2012    face  . Coronary angioplasty with stent placement  06/26/2014    pLAD 50%, d LAD 60%, oD1 80%,  mD1  70%, CFX 50%, OM 2 70%,  RCA 80%, PDA 95% (<48mm), OM1 99%-0% with Bio flow stent       . Left heart catheterization with coronary angiogram N/A 06/26/2014    Procedure: LEFT HEART CATHETERIZATION WITH CORONARY ANGIOGRAM;  Surgeon: Blane Ohara, MD;  Location: [facility] CATH LAB;  Service: Cardiovascular;  Laterality: N/A;  . Left heart catheterization with coronary angiogram N/A 08/07/2014    Procedure: LEFT HEART CATHETERIZATION WITH CORONARY ANGIOGRAM;  Surgeon: Blane Ohara, MD;  Location: [facility] CATH LAB;  Service: Cardiovascular;  Laterality: N/A;  . Colonoscopy w/ polypectomy    . Endarterectomy Left 04/23/2015    Procedure: ENDARTERECTOMY CAROTID;  Surgeon: Conrad [place], MD;  Location: [facility];  Service: Vascular;  Laterality: Left;  . Patch angioplasty Left 04/23/2015    Procedure: PATCH ANGIOPLASTY USING 1CM X 6CM XENOSURE BIOLOGIC PATCH;  Surgeon: Conrad [place], MD;  Location: [facility];  Service: Vascular;  Laterality: Left;  . Carotid endarterectomy Left Ssept. 14, 2016    CEA    There were no vitals filed for this visit.  Visit Diagnosis:  BPPV (benign paroxysmal positional vertigo), right  Dizziness and giddiness      Subjective Assessment - 08/07/15 0803    Subjective Pre-session, pt reports no "spinning" this morning but does report  feeling "a little cotton-headed".  After assessment of BPPV and treatment with R Epley, pt requesting to end session due to "feeling really good and not wanting to mess it up by doing exercises."   Patient is accompained by: Family member   Pertinent History Goes by "[name]". PMH: diabetes, complicated by retinopathy, peripheral neuropathy, history of obstructive sleep apnea, coronary artery disease, status post stent placement, depression, obesity, hyperlipidemia, hypertension, carotid artery disease, status post left carotid endarterectomy in 2016, and osteoarthritis   Limitations Reading   Patient Stated Goals "Stop whatever is going on and maybe learn how to manage it if it happens in the future."   Currently in Pain? No/denies                Vestibular Assessment - 08/07/15 0001    Symptom Behavior   Type of Dizziness Comment  feeling "cotton-headed"   Frequency of Dizziness with movement (standing up, walking around)   Aggravating Factors Mornings;Sitting with head tilted back;Supine to sit;Sit to stand   Relieving Factors No known relieving factors   Positional Testing   Sidelying Test Sidelying Right;Sidelying Left   Sidelying Right   Sidelying Right Duration 30 seconds   Sidelying Right Symptoms Upbeat, right rotatory nystagmus   Sidelying Left   Sidelying Left Duration NA   Sidelying Left Symptoms No nystagmus                  Vestibular Treatment/Exercise - 08/07/15 0001    Vestibular Treatment/Exercise   Vestibular Treatment Provided Canalith Repositioning    EPLEY MANUEVER RIGHT   Number of Reps  1   Overall Response Improved Symptoms   Response Details  Feeling of "cotton headedness" resolved. Did not retest, per pt request to end session after Epley.               PT Education - 08/07/15 0834    Education provided Yes   Education Details Recommending pt begin performing HEP Laruth Bouchard Daroff habituation) tomorrow.   Person(s) Educated  Patient;Spouse   Methods Explanation;Demonstration;Handout  provided handout during prior session   Comprehension Verbalized understanding;Returned demonstration          PT Short Term Goals - 07/31/15 1055    PT SHORT TERM GOAL #1   Title STG's = LTG's           PT Long Term Goals - 07/31/15 1055    PT LONG TERM GOAL #1   Title Positional vertigo testing will be negative to indicate resolved BPPV.  Target date: 08/28/15   PT LONG TERM GOAL #2   Title Pt will decrease DHI score from  38 to < / = 20 to indicate significant decrease in pt-perceived disability due to dizziness. Target date: 08/28/15   PT LONG TERM GOAL #3   Title Assess strength and balance, if indicated, after vertigo clears. Target date: 08/28/15               Plan - 08/07/15 1247    Clinical Impression Statement Brief session focused on reassessment and treatment of BPPV. R Sidelying Test revealed R upbeating torsional nystagmus x30 seconds accompanied by vertigo. Unable to rule out R posterior canalithiasis. After performance of R Epley x1 trial, pt felt significantly better and therefore requested to end session at this time. Recommended pt begin [name] habituation tomorrow.   Pt will benefit from skilled therapeutic intervention in order to improve on the following deficits Dizziness;Abnormal gait   Rehab Potential Good   PT Frequency 2x / week   PT Duration 4 weeks   PT Treatment/Interventions ADLs/Self Care Home Management;Canalith Repostioning;Gait training;Stair training;Functional mobility training;Therapeutic activities;Therapeutic exercise;Balance training;Vestibular;Neuromuscular re-education;Patient/family education   PT Next Visit Plan If PT feels appropriate, test BPPV and treat accordingly. Add to home exercises based on vestibular impairments, functional limitations.    Consulted and Agree with Plan of Care Patient;Family member/caregiver   Family Member Consulted wife, [name]         Problem List Patient Active Problem List   Diagnosis Date Noted  . Carotid artery disease without cerebral infarction ([place]) 04/09/2015  . Ischemic chest pain ([place]) 08/07/2014  . Exertional angina ([place]) 06/26/2014  . Chest pain of uncertain etiology 99991111  . Diabetic neuropathy ([place]) 06/21/2014  . Essential hypertension 06/21/2014  . Hypercholesterolemia 06/21/2014  . Rotator cuff tear 10/15/2011  . IDDM (insulin dependent diabetes mellitus) ([place]) 10/16/2008    Billie Ruddy, PT, DPT [facility] [address] [place], [place], [address] Phone: [phone]   Fax:  [phone] 08/07/2015, 12:53 PM   Name: ADVITH JANOTA MRN: [id number] Date of Birth: 1940/08/17

## 2015-08-08 ENCOUNTER — Encounter: Payer: Medicare Other | Admitting: Physical Therapy

## 2015-08-13 ENCOUNTER — Ambulatory Visit: Payer: Medicare Other | Attending: Neurology | Admitting: Rehabilitative and Restorative Service Providers"

## 2015-08-13 DIAGNOSIS — R42 Dizziness and giddiness: Secondary | ICD-10-CM | POA: Diagnosis not present

## 2015-08-13 DIAGNOSIS — H8111 Benign paroxysmal vertigo, right ear: Secondary | ICD-10-CM | POA: Diagnosis present

## 2015-08-13 DIAGNOSIS — R269 Unspecified abnormalities of gait and mobility: Secondary | ICD-10-CM | POA: Diagnosis present

## 2015-08-13 NOTE — Patient Instructions (Signed)
  Gaze Stabilization: Tip Card 1.Target must remain in focus, not blurry, and appear stationary while head is in motion. 2.Perform exercises with small head movements (45 to either side of midline). 3.Increase speed of head motion so long as target is in focus. 4.If you wear eyeglasses, be sure you can see target through lens (therapist will give specific instructions for bifocal / progressive lenses). 5.These exercises may provoke dizziness or nausea. Work through these symptoms. If too dizzy, slow head movement slightly. Rest between each exercise. 6.Exercises demand concentration; avoid distractions. 7.For safety, perform standing exercises close to a counter, wall, corner, or next to someone.  Copyright  VHI. All rights reserved.  Gaze Stabilization: Standing Feet Apart   Feet shoulder width apart, keeping eyes on target on wall 3 feet away, tilt head down slightly and move head side to side for 30 seconds. Repeat while moving head up and down for 30 seconds. Do 2 sessions per day.   Copyright  VHI. All rights reserved.   Feet Apart, Varied Arm Positions - Eyes Closed    Stand with feet shoulder width apart and arms at your side. Close eyes and visualize upright position. Hold _30___ seconds. Repeat __3__ times per session. Do __2__ sessions per day.  Copyright  VHI. All rights reserved.   Feet Together, Head Motion - Eyes Open    With eyes open, feet together, move head slowly: up and down 5 times.  Repeat moving your head side to side 5 times. Do _2___ sessions per day.  Copyright  VHI. All rights reserved.   Feet Apart (Compliant Surface) Varied Arm Positions - Eyes Open    With eyes open, standing on compliant surface:  Pillow or Folded towel, feet shoulder width apart and arms at your side, look at a stationary object. Hold __30-60__ seconds. Do __2__ sessions per day.  Copyright  VHI. All rights reserved.

## 2015-08-13 NOTE — Therapy (Signed)
[facility] [address] [place], [place], [address] Phone: [phone]   Fax:  [phone]  Physical Therapy Treatment  Patient Details  Name: Seth Young MRN: [id number] Date of Birth: 03-11-1941 Referring Provider: Star Age, MD, PhD  Encounter Date: 08/13/2015      PT End of Session - 08/13/15 1346    Visit Number 6   Number of Visits 9   Date for PT Re-Evaluation 08/30/15   Authorization Type BCBS Medicare - G Codes required   PT Start Time 0935   PT Stop Time 1020   PT Time Calculation (min) 45 min   Equipment Utilized During Treatment Gait belt   Activity Tolerance Patient tolerated treatment well   Behavior During Therapy [facility] for tasks assessed/performed      Past Medical History  Diagnosis Date  . Hypertension   . Colon polyps 10-12-11    past hx.  . Exertional angina ([place]) 06/26/2014    Cath with DES to the OM, Bioflow protocol   . High cholesterol   . Pneumonia 1940's X 2  . Type II diabetes mellitus ([place]) dx'd 1982    nsulin started ~ 2000  . H/O hiatal hernia     no problems  . GERD (gastroesophageal reflux disease)   . Arthritis     "knees, toes, hands" (06/26/2014)  . Pseudogout     "it moves around"  . Ischemic chest pain ([place]) 08/07/2014  . Sleep apnea     no cpap used  . Basal cell carcinoma     left temporal area-no residual  . Peripheral neuropathy ([place])   . Occasional tremors     Bilateral hands  . Vertigo   . Ataxia     Past Surgical History  Procedure Laterality Date  . Back surgery    . Nasal septum surgery  1979  . Carpal tunnel release Left 10/2009  . Knee arthroscopy Left 05/2011    10'12-left knee  . Shoulder open rotator cuff repair  10/14/2011    Procedure: ROTATOR CUFF REPAIR SHOULDER OPEN;  Surgeon: Johnn Hai, MD;  Location: [facility];  Service: Orthopedics;  Laterality: Right;  . Subacromial decompression  10/14/2011    Procedure: SUBACROMIAL  DECOMPRESSION;  Surgeon: Johnn Hai, MD;  Location: [facility];  Service: Orthopedics;  Laterality: Right;  . Cardiac catheterization  02/2005    Dr. Mare Ferrari; negative.  . Coronary angioplasty with stent placement  06/26/2014    "1"  . Posterior lumbar fusion  1964  . Carpal tunnel release Right ~ 2013  . Cataract extraction w/ intraocular lens implant Left 2000's  . Cataract extraction w/ intraocular lens implant Right 2015  . Elbow surgery Bilateral ~ 2014    "for blockages; Dr. Amedeo Plenty"  . Trigger finger release Right ~ 2013-2014    "3rd & 4th digits"  . Basal cell carcinoma excision Left ~ 2012    face  . Coronary angioplasty with stent placement  06/26/2014    pLAD 50%, d LAD 60%, oD1 80%,  mD1  70%, CFX 50%, OM 2 70%,  RCA 80%, PDA 95% (<5mm), OM1 99%-0% with Bio flow stent       . Left heart catheterization with coronary angiogram N/A 06/26/2014    Procedure: LEFT HEART CATHETERIZATION WITH CORONARY ANGIOGRAM;  Surgeon: Blane Ohara, MD;  Location: [facility] CATH LAB;  Service: Cardiovascular;  Laterality: N/A;  . Left heart catheterization with coronary angiogram N/A 08/07/2014  Procedure: LEFT HEART CATHETERIZATION WITH CORONARY ANGIOGRAM;  Surgeon: Blane Ohara, MD;  Location: [facility] CATH LAB;  Service: Cardiovascular;  Laterality: N/A;  . Colonoscopy w/ polypectomy    . Endarterectomy Left 04/23/2015    Procedure: ENDARTERECTOMY CAROTID;  Surgeon: Conrad Winters, MD;  Location: [facility];  Service: Vascular;  Laterality: Left;  . Patch angioplasty Left 04/23/2015    Procedure: PATCH ANGIOPLASTY USING 1CM X 6CM XENOSURE BIOLOGIC PATCH;  Surgeon: Conrad [place], MD;  Location: [place];  Service: Vascular;  Laterality: Left;  . Carotid endarterectomy Left Ssept. 14, 2016    CEA    There were no vitals filed for this visit.  Visit Diagnosis:  Dizziness and giddiness  BPPV (benign paroxysmal positional vertigo), right  Abnormality of gait      Subjective Assessment - 08/13/15 0940     Subjective The patient has variable days.  2 days ago, he reported a bad day where he was unsteady and dizzy.  It improved yesterday and today he is unsteady.  The patient reports he uses sit>sidelying exercise to help decrease symptoms on days when dizziness feels worse.  He has h/o L eye visual changes.     Patient Stated Goals "Stop whatever is going on and maybe learn how to manage it if it happens in the future."   Currently in Pain? No/denies      NEUROMUSCULAR RE-EDUCATION: Sit>R sidelying provokes mild dizziness with nystagmus not viewed in primary eye position (could see trace nystagmus with R gaze) No nystagmus/dizziness with sit>L sidelying No nystagmus/dizziness with horizontal roll tests  Habituation x 5 reps sit<>bilateral sidelying with no symptoms sit>R sidelying after 2nd repetition.  Noted worse symptoms with return to sitting position.  Gaze x 1 viewing standing with UE support on back of chair, educated wife on how to assist for in home Corner balance exercises consisting of: Standing on solid surface with feet apart progressing to feet together with eyes open and eyes closed, adding head motion with eyes open with CGA to min A for safety. Compliant surfaces with feet apart and visual fixation on target.  Tried eyes closed x 10 second intervals with min A for safety.  Provided updated HEP to include gaze and balance tasks. Recommended patient begin walking 10 minutes 2x/day on level surface, well lit environment for safety (with supervision if needed).       PT Education - 08/13/15 1345    Education provided Yes   Education Details HEP: gaze x 1 in standing with UE support (chair), corner balance (eyes closed, head turns, compliant surface) with wife's assistance   Person(s) Educated Patient;Spouse   Methods Explanation;Demonstration;Handout   Comprehension Verbalized understanding;Returned demonstration          PT Short Term Goals - 07/31/15 1055    PT  SHORT TERM GOAL #1   Title STG's = LTG's           PT Long Term Goals - 08/13/15 1347    PT LONG TERM GOAL #1   Title Positional vertigo testing will be negative to indicate resolved BPPV.  Target date: 08/28/15   PT LONG TERM GOAL #2   Title Pt will decrease DHI score from 38 to < / = 20 to indicate significant decrease in pt-perceived disability due to dizziness. Target date: 08/28/15   PT LONG TERM GOAL #3   Title Assess strength and balance, if indicated, after vertigo clears. Target date: 08/28/15   PT LONG TERM GOAL #4  Title Patient will maintain feet together eyes closed x 30 seconds to demonstrate improved balance during multi-sensory environments.   Baseline Target date 08/28/2015   Status New   PT LONG TERM GOAL #5   Title The patient will perform home walking program to increase tolerance to movement for improved balance and mgmt of dizziness.    Baseline Target date 08/28/2015   Status New               Plan - 08/13/15 1350    Clinical Impression Statement The patient presented today with instability when walking into clinic.  He continues with trace nystagmus/dizziness with right sit>sidelying.  PT focused today on improving VOR through gaze x 1 adaptation, challenging balance strategies providing HEP for multi-sensory environments (eyes open/eyes closed, foam, solid surface, narrow<>wide BOS).  The patient had questions re: how his condition developed and PT educated on our focus to optimize current functional level, and educated on multi-sensory nature of his impairments (neuropathy +  L visual issues + dizziness) leading to imbalance.     PT Next Visit Plan Formally assess balance Merrilee Jansky), 23M and write further LTGs.  Check HEP and perform dynamic gait activities.   Consulted and Agree with Plan of Care Patient;Family member/caregiver   Family Member Consulted wife, [name]        Problem List Patient Active Problem List   Diagnosis Date Noted  . Carotid artery  disease without cerebral infarction ([place]) 04/09/2015  . Ischemic chest pain ([place]) 08/07/2014  . Exertional angina ([place]) 06/26/2014  . Chest pain of uncertain etiology 99991111  . Diabetic neuropathy ([place]) 06/21/2014  . Essential hypertension 06/21/2014  . Hypercholesterolemia 06/21/2014  . Rotator cuff tear 10/15/2011  . IDDM (insulin dependent diabetes mellitus) ([place]) 10/16/2008    Chaia Ikard, PT 08/13/2015, 1:53 PM  [facility] [address] [place], [place], [address] Phone: [phone]   Fax:  [phone]  Name: Seth Young MRN: [id number] Date of Birth: [birth year]

## 2015-08-15 ENCOUNTER — Ambulatory Visit: Payer: Medicare Other | Admitting: Rehabilitative and Restorative Service Providers"

## 2015-08-15 DIAGNOSIS — H8111 Benign paroxysmal vertigo, right ear: Secondary | ICD-10-CM

## 2015-08-15 DIAGNOSIS — R269 Unspecified abnormalities of gait and mobility: Secondary | ICD-10-CM

## 2015-08-15 DIAGNOSIS — R42 Dizziness and giddiness: Secondary | ICD-10-CM

## 2015-08-15 NOTE — Therapy (Signed)
[facility] [address] [place], [place], [address] Phone: [phone]   Fax:  [phone]  Physical Therapy Treatment  Patient Details  Name: Seth Young MRN: [id number] Date of Birth: 07/25/1941 Referring Provider: Star Age, MD, PhD  Encounter Date: 08/15/2015      PT End of Session - 08/15/15 1212    Visit Number 7   Number of Visits 9   Date for PT Re-Evaluation 08/30/15   Authorization Type BCBS Medicare - G Codes required   PT Start Time 0804   PT Stop Time 0850   PT Time Calculation (min) 46 min   Equipment Utilized During Treatment Gait belt   Activity Tolerance Patient tolerated treatment well   Behavior During Therapy [facility] for tasks assessed/performed      Past Medical History  Diagnosis Date  . Hypertension   . Colon polyps 10-12-11    past hx.  . Exertional angina ([place]) 06/26/2014    Cath with DES to the OM, Bioflow protocol   . High cholesterol   . Pneumonia 1940's X 2  . Type II diabetes mellitus ([place]) dx'd 1982    nsulin started ~ 2000  . H/O hiatal hernia     no problems  . GERD (gastroesophageal reflux disease)   . Arthritis     "knees, toes, hands" (06/26/2014)  . Pseudogout     "it moves around"  . Ischemic chest pain ([place]) 08/07/2014  . Sleep apnea     no cpap used  . Basal cell carcinoma     left temporal area-no residual  . Peripheral neuropathy ([place])   . Occasional tremors     Bilateral hands  . Vertigo   . Ataxia     Past Surgical History  Procedure Laterality Date  . Back surgery    . Nasal septum surgery  1979  . Carpal tunnel release Left 10/2009  . Knee arthroscopy Left 05/2011    10'12-left knee  . Shoulder open rotator cuff repair  10/14/2011    Procedure: ROTATOR CUFF REPAIR SHOULDER OPEN;  Surgeon: Johnn Hai, MD;  [facility]: [facility];  Service: Orthopedics;  Laterality: Right;  . Subacromial decompression  10/14/2011    Procedure: SUBACROMIAL  DECOMPRESSION;  Surgeon: Johnn Hai, MD;  Location: [facility];  Service: Orthopedics;  Laterality: Right;  . Cardiac catheterization  02/2005    Dr. Mare Ferrari; negative.  . Coronary angioplasty with stent placement  06/26/2014    "1"  . Posterior lumbar fusion  1964  . Carpal tunnel release Right ~ 2013  . Cataract extraction w/ intraocular lens implant Left 2000's  . Cataract extraction w/ intraocular lens implant Right 2015  . Elbow surgery Bilateral ~ 2014    "for blockages; Dr. Amedeo Plenty"  . Trigger finger release Right ~ 2013-2014    "3rd & 4th digits"  . Basal cell carcinoma excision Left ~ 2012    face  . Coronary angioplasty with stent placement  06/26/2014    pLAD 50%, d LAD 60%, oD1 80%,  mD1  70%, CFX 50%, OM 2 70%,  RCA 80%, PDA 95% (<68mm), OM1 99%-0% with Bio flow stent       . Left heart catheterization with coronary angiogram N/A 06/26/2014    Procedure: LEFT HEART CATHETERIZATION WITH CORONARY ANGIOGRAM;  Surgeon: Blane Ohara, MD;  Location: [facility] CATH LAB;  Service: Cardiovascular;  Laterality: N/A;  . Left heart catheterization with coronary angiogram N/A 08/07/2014  Procedure: LEFT HEART CATHETERIZATION WITH CORONARY ANGIOGRAM;  Surgeon: Blane Ohara, MD;  Location: [facility] CATH LAB;  Service: Cardiovascular;  Laterality: N/A;  . Colonoscopy w/ polypectomy    . Endarterectomy Left 04/23/2015    Procedure: ENDARTERECTOMY CAROTID;  Surgeon: Conrad [place], MD;  Location: [place];  Service: Vascular;  Laterality: Left;  . Patch angioplasty Left 04/23/2015    Procedure: PATCH ANGIOPLASTY USING 1CM X 6CM XENOSURE BIOLOGIC PATCH;  Surgeon: Conrad [place], MD;  Location: [place];  Service: Vascular;  Laterality: Left;  . Carotid endarterectomy Left Ssept. 14, 2016    CEA    There were no vitals filed for this visit.  Visit Diagnosis:  Abnormality of gait  Dizziness and giddiness  BPPV (benign paroxysmal positional vertigo), right      Subjective Assessment - 08/15/15 0759     Subjective The patient reports "I didn't have any problems yesterday."  He woke up this morning with whoozy headed sensation and performed the sit<>sidelying exercise to help get more balanced.     Patient Stated Goals "Stop whatever is going on and maybe learn how to manage it if it happens in the future."   Currently in Pain? No/denies            [facility] PT Assessment - 08/15/15 0809    Ambulation/Gait   Ambulation/Gait Yes   Ambulation Surface Level   Gait velocity 3.2 ft/sec   Standardized Balance Assessment   Standardized Balance Assessment Berg Balance Test   Berg Balance Test   Sit to Stand Able to stand without using hands and stabilize independently   Standing Unsupported Able to stand safely 2 minutes   Sitting with Back Unsupported but Feet Supported on Floor or Stool Able to sit safely and securely 2 minutes   Stand to Sit Sits safely with minimal use of hands   Transfers Able to transfer safely, minor use of hands   Standing Unsupported with Eyes Closed Able to stand 10 seconds with supervision   Standing Ubsupported with Feet Together Able to place feet together independently and stand 1 minute safely   From Standing, Reach Forward with Outstretched Arm Can reach forward >12 cm safely (5")   From Standing Position, Pick up Object from Floor Able to pick up shoe safely and easily   From Standing Position, Turn to Look Behind Over each Shoulder Turn sideways only but maintains balance   Turn 360 Degrees Needs close supervision or verbal cueing   Standing Unsupported, Alternately Place Feet on Step/Stool Able to stand independently and safely and complete 8 steps in 20 seconds   Standing Unsupported, One Foot in Front Needs help to step but can hold 15 seconds   Standing on One Leg Able to lift leg independently and hold equal to or more than 3 seconds   Total Score 44   Berg comment: 44/56 indicating risk for falls      NEUROMUSCULAR RE-EDUCATION: Berg=44/56 Gaze x 1  viewing with verbal cues to increase speed of movement.  Patient demonstrates improved balance today with activities.  Sit<>sidelying performed with trace amounts of dizziness to right side x seconds, without nystagmus viewed in room light.  Compliant surface training activities in parallel bars with intermittent UE support performing rocker board activities with eyes open + head turns, proactive reaching, and eyes closed. Foam airex standing activities with eyes open + head turns, eyes closed and reaching activities.  Marching on firm mat surface with head turns, sidestepping on  level surfaces adding head turns. 180 degree turns with cone taps for dynamic postural control with CGA to min A for safety.  Gait: Compliant surface gait on firm mat surface x 10 feet x 3 reps with SBA. Gait with eyes closed near a support surface with one finger touch for proprioceptive feedback x 10 feet x 6 reps Figure 8 turns near counter with initially taking multiple small steps and improving stride and balance with repetition with supervision  Gait with ball toss x 250 feet nonstop and then with horizontal head turns reading cards for visual targets with SBA to CGA.   SELF CARE/HOME MANAGEMENT: Discussed long term plan to progress general strength and balance and maintain mobility considering multi-sensory balance issues.  Patient likes to swim indoors when warm outside.  Discussed options for joining gym to establish a strengthening program and recommended continued balance and walking activities for long term.       PT Education - 08/15/15 1011    Education provided Yes   Education Details Discussed walking program, general wellness program emphasizing need to strengthen areas that he can change (provided example of LEs strengthening after h/o R knee surgery vs. unable to modify peripheral neuropathy or visual issues).     Person(s) Educated Patient;Spouse   Methods Explanation;Demonstration;Handout    Comprehension Returned demonstration;Verbalized understanding          PT Short Term Goals - 07/31/15 1055    PT SHORT TERM GOAL #1   Title STG's = LTG's           PT Long Term Goals - 08/15/15 1212    PT LONG TERM GOAL #1   Title Positional vertigo testing will be negative to indicate resolved BPPV.  Target date: 08/28/15   Status On-going   PT LONG TERM GOAL #2   Title Pt will decrease DHI score from 38 to < / = 20 to indicate significant decrease in pt-perceived disability due to dizziness. Target date: 08/28/15   Status On-going   PT LONG TERM GOAL #3   Title Assess strength and balance, if indicated, after vertigo clears. Target date: 08/28/15   Baseline Pt scores 44/56 on Berg, gait speed=3.20 ft/sec.  Has improved single leg control on L leg vs. R (h/o R knee surgery)   Status Achieved   PT LONG TERM GOAL #4   Title Patient will maintain feet together eyes closed x 30 seconds to demonstrate improved balance during multi-sensory environments.   Baseline Target date 08/28/2015   Status On-going   PT LONG TERM GOAL #5   Title The patient will perform home walking program to increase tolerance to movement for improved balance and mgmt of dizziness.    Baseline Target date 08/28/2015   Status On-going   Additional Long Term Goals   Additional Long Term Goals Yes   PT LONG TERM GOAL #6   Title The patient will improve Berg score from 44/56 to > or equal to 48/56 to demo decreasing risk for falls.   Baseline Target date 08/28/2015   Status New               Plan - 08/15/15 1216    Clinical Impression Statement The patient presents with 44/56 Berg score with difficulty with turns, narrow base of support and single limb stance.  The patient will benefit from continued physical therapy emphasizing multi-sensory balance training, management of vertigo, and general mobility training.  Patient has trace vertigo today with sit>R sidelying.  No  nystagmus viewed in room light and  trace subjective report of dizziness present with brandt daroff habituation.  The patient inquires about reducing frequency of therapy.  PT agrees to 1x/week as long as vertigo continues to remain manageable with HEP with PT focus on progressing HEP and encouraging return to participation in community exercise program.   PT Next Visit Plan Dynamic gait activities progressed, compliant surface training, decreasing frequency to 1x/week.   Consulted and Agree with Plan of Care Patient;Family member/caregiver   Family Member Consulted wife, [name]        Problem List Patient Active Problem List   Diagnosis Date Noted  . Carotid artery disease without cerebral infarction ([place]) 04/09/2015  . Ischemic chest pain ([place]) 08/07/2014  . Exertional angina ([place]) 06/26/2014  . Chest pain of uncertain etiology 99991111  . Diabetic neuropathy ([place]) 06/21/2014  . Essential hypertension 06/21/2014  . Hypercholesterolemia 06/21/2014  . Rotator cuff tear 10/15/2011  . IDDM (insulin dependent diabetes mellitus) ([place]) 10/16/2008    Makaiah Terwilliger, PT 08/15/2015, 12:20 PM  [facility] [address] [place], [place], [address] Phone: [phone]   Fax:  [phone]  Name: Seth Young MRN: [id number] Date of Birth: 1940-12-13

## 2015-08-18 ENCOUNTER — Ambulatory Visit: Payer: Medicare Other | Admitting: Physical Therapy

## 2015-08-22 ENCOUNTER — Encounter: Payer: Self-pay | Admitting: Rehabilitative and Restorative Service Providers"

## 2015-08-22 ENCOUNTER — Ambulatory Visit: Payer: Medicare Other | Admitting: Rehabilitative and Restorative Service Providers"

## 2015-08-22 DIAGNOSIS — R42 Dizziness and giddiness: Secondary | ICD-10-CM

## 2015-08-22 DIAGNOSIS — H8111 Benign paroxysmal vertigo, right ear: Secondary | ICD-10-CM

## 2015-08-22 DIAGNOSIS — R269 Unspecified abnormalities of gait and mobility: Secondary | ICD-10-CM

## 2015-08-22 NOTE — Therapy (Signed)
Seth Young [address] [place], [place], [address] Phone: [phone]   Fax:  [phone]  Physical Therapy Treatment  Patient Details  Name: Seth Young MRN: [id number] Date of Birth: 1940-10-20 Referring Provider: Star Age, MD, PhD  Encounter Date: 08/22/2015      PT End of Session - 08/22/15 0832    Visit Number 8   Number of Visits 9   Date for PT Re-Evaluation 08/30/15   Authorization Type BC[employer] Medicare - G Codes required   PT Start Time 0805   PT Stop Time 0835   PT Time Calculation (min) 30 min   Activity Tolerance Patient tolerated treatment well   Behavior During Therapy [facility] for tasks assessed/performed      Past Medical History  Diagnosis Date  . Hypertension   . Colon polyps 10-12-11    past hx.  . Exertional angina ([place]) 06/26/2014    Cath with DES to the OM, Bioflow protocol   . High cholesterol   . Pneumonia 1940's X 2  . Type II diabetes mellitus ([place]) dx'd 1982    nsulin started ~ 2000  . H/O hiatal hernia     no problems  . GERD (gastroesophageal reflux disease)   . Arthritis     "knees, toes, hands" (06/26/2014)  . Pseudogout     "it moves around"  . Ischemic chest pain ([place]) 08/07/2014  . Sleep apnea     no cpap used  . Basal cell carcinoma     left temporal area-no residual  . Peripheral neuropathy ([place])   . Occasional tremors     Bilateral hands  . Vertigo   . Ataxia     Past Surgical History  Procedure Laterality Date  . Back surgery    . Nasal septum surgery  1979  . Carpal tunnel release Left 10/2009  . Knee arthroscopy Left 05/2011    10'12-left knee  . Shoulder open rotator cuff repair  10/14/2011    Procedure: ROTATOR CUFF REPAIR SHOULDER OPEN;  Surgeon: Johnn Hai, MD;  Location: [facility] ORS;  Service: Orthopedics;  Laterality: Right;  . Subacromial decompression  10/14/2011    Procedure: SUBACROMIAL DECOMPRESSION;  Surgeon: Johnn Hai, MD;  Location: [facility]  ORS;  Service: Orthopedics;  Laterality: Right;  . Cardiac catheterization  02/2005    Dr. Mare Ferrari; negative.  . Coronary angioplasty with stent placement  06/26/2014    "1"  . Posterior lumbar fusion  1964  . Carpal tunnel release Right ~ 2013  . Cataract extraction w/ intraocular lens implant Left 2000's  . Cataract extraction w/ intraocular lens implant Right 2015  . Elbow surgery Bilateral ~ 2014    "for blockages; Dr. Amedeo Plenty"  . Trigger finger release Right ~ 2013-2014    "3rd & 4th digits"  . Basal cell carcinoma excision Left ~ 2012    face  . Coronary angioplasty with stent placement  06/26/2014    pLAD 50%, d LAD 60%, oD1 80%,  mD1  70%, CFX 50%, OM 2 70%,  RCA 80%, PDA 95% (<53m), OM1 99%-0% with Bio flow stent       . Left heart catheterization with coronary angiogram N/A 06/26/2014    Procedure: LEFT HEART CATHETERIZATION WITH CORONARY ANGIOGRAM;  Surgeon: MBlane Ohara MD;  [facility];  Service: Cardiovascular;  Laterality: N/A;  . Left heart catheterization with coronary angiogram N/A 08/07/2014    Procedure: LEFT HEART CATHETERIZATION WITH CORONARY ANGIOGRAM;  Surgeon: Blane Ohara, MD;  Location: [facility] CATH LAB;  Service: Cardiovascular;  Laterality: N/A;  . Colonoscopy w/ polypectomy    . Endarterectomy Left 04/23/2015    Procedure: ENDARTERECTOMY CAROTID;  Surgeon: Conrad [place], MD;  Location: [facility];  Service: Vascular;  Laterality: Left;  . Patch angioplasty Left 04/23/2015    Procedure: PATCH ANGIOPLASTY USING 1CM X 6CM XENOSURE BIOLOGIC PATCH;  Surgeon: Conrad Eagle, MD;  Location: [facility];  Service: Vascular;  Laterality: Left;  . Carotid endarterectomy Left Ssept. 14, 2016    CEA    There were no vitals filed for this visit.  Visit Diagnosis:  Dizziness and giddiness  BPPV (benign paroxysmal positional vertigo), right  Abnormality of gait      Subjective Assessment - 08/22/15 0807    Subjective The patient reports his problems are resolving  and he is tolerating exercises well.    Patient Stated Goals "Stop whatever is going on and maybe learn how to manage it if it happens in the future."   Currently in Pain? No/denies            [facility] PT Assessment - 08/22/15 0812    Standardized Balance Assessment   Standardized Balance Assessment Berg Balance Test   Berg Balance Test   Sit to Stand Able to stand without using hands and stabilize independently   Standing Unsupported Able to stand safely 2 minutes   Sitting with Back Unsupported but Feet Supported on Floor or Stool Able to sit safely and securely 2 minutes   Stand to Sit Sits safely with minimal use of hands   Transfers Able to transfer safely, minor use of hands   Standing Unsupported with Eyes Closed Able to stand 10 seconds safely   Standing Ubsupported with Feet Together Able to place feet together independently and stand 1 minute safely   From Standing, Reach Forward with Outstretched Arm Can reach forward >12 cm safely (5")   From Standing Position, Pick up Object from Floor Able to pick up shoe safely and easily   From Standing Position, Turn to Look Behind Over each Shoulder Turn sideways only but maintains balance   Turn 360 Degrees Able to turn 360 degrees safely in 4 seconds or less   Standing Unsupported, Alternately Place Feet on Step/Stool Able to stand independently and safely and complete 8 steps in 20 seconds   Standing Unsupported, One Foot in Front Able to plae foot ahead of the other independently and hold 30 seconds   Standing on One Leg Able to lift leg independently and hold equal to or more than 3 seconds   Total Score 50   Berg comment: 50/56 today improved from [place]: Berg=50/56 Single limb stance activities Feet together with eyes closed  SELF CARE/HOME MANAGEMENT: Progression of home and community exercise post d/c Progression of home walking program        PT Education - 08/22/15 0832     Education provided Yes   Education Details reviewed progression of community exercise, home program after discharge, walking program   Person(s) Educated Patient;Spouse   Methods Explanation;Demonstration;Handout   Comprehension Verbalized understanding;Returned demonstration          PT Short Term Goals - 07/31/15 1055    PT SHORT TERM GOAL #1   Title STG's = LTG's           PT Long Term Goals - 08/22/15 0809    PT LONG  TERM GOAL #1   Title Positional vertigo testing will be negative to indicate resolved BPPV.  Target date: 08/28/15   Baseline No vertigo or dizziness with R sidelying.   Status Achieved   PT LONG TERM GOAL #2   Title Pt will decrease DHI score from 38 to < / = 20 to indicate significant decrease in pt-perceived disability due to dizziness. Target date: 08/28/15   Baseline Pt scores 6% on DHI at discharge.   Status Achieved   PT LONG TERM GOAL #3   Title Assess strength and balance, if indicated, after vertigo clears. Target date: 08/28/15   Baseline Pt scores 44/56 on Berg, gait speed=3.20 ft/sec.  Has improved single leg control on L leg vs. R (h/o R knee surgery)   Status Achieved   PT LONG TERM GOAL #4   Title Patient will maintain feet together eyes closed x 30 seconds to demonstrate improved balance during multi-sensory environments.   Baseline Feet not touching, but narrow - can do x 30 seconds.   Status Partially Met   PT LONG TERM GOAL #5   Title The patient will perform home walking program to increase tolerance to movement for improved balance and mgmt of dizziness.    Baseline Target date 08/28/2015   Status Achieved   PT LONG TERM GOAL #6   Title The patient will improve Berg score from 44/56 to > or equal to 48/56 to demo decreasing risk for falls.   Baseline Scores 50/56.   Status Achieved               Plan - 09-17-2015 7341    Clinical Impression Statement The patient has met most LTGs and has HEP to address remaining deficits.  PT has  educated patient on self mgmt of dizziness, balance exercises, and general wellness.   PT Next Visit Plan Discharge visit   Consulted and Agree with Plan of Care Patient;Family member/caregiver   Family Member Consulted wife, Bertrum Sol - Sep 17, 2015 [id number]    Functional Assessment Tool Used Berg=50/56, DHI=   Functional Limitation Self care   Self Care Goal Status [phone]) At least 1 percent but less than 20 percent impaired, limited or restricted   Self Care Discharge Status [phone]) At least 1 percent but less than 20 percent impaired, limited or restricted      Problem List Patient Active Problem List   Diagnosis Date Noted  . Carotid artery disease without cerebral infarction ([place]) 04/09/2015  . Ischemic chest pain ([place]) 08/07/2014  . Exertional angina ([place]) 06/26/2014  . Chest pain of uncertain etiology 99/24/2683  . Diabetic neuropathy ([place]) 06/21/2014  . Essential hypertension 06/21/2014  . Hypercholesterolemia 06/21/2014  . Rotator cuff tear 10/15/2011  . IDDM (insulin dependent diabetes mellitus) ([place]) 10/16/2008    Jshawn Hurta, PT September 17, 2015, 8:40 AM  [facility] [address] [place], [place], [address] Phone: [phone]   Fax:  [phone]  Name: Seth Young MRN: [id number] Date of Birth: 12-15-1940

## 2015-08-22 NOTE — Patient Instructions (Signed)
SINGLE LIMB STANCE    Stance: single leg on floor. Raise leg. Hold _10__ seconds. Repeat with other leg.  *BEGIN by holding on with one finger tip and progress to no hands as you are able. _3__ reps each leg. 1-2 times/day.  Copyright  VHI. All rights reserved.   Gaze Stabilization: Tip Card 1.Target must remain in focus, not blurry, and appear stationary while head is in motion. 2.Perform exercises with small head movements (45 to either side of midline). 3.Increase speed of head motion so long as target is in focus. 4.If you wear eyeglasses, be sure you can see target through lens (therapist will give specific instructions for bifocal / progressive lenses). 5.These exercises may provoke dizziness or nausea. Work through these symptoms. If too dizzy, slow head movement slightly. Rest between each exercise. 6.Exercises demand concentration; avoid distractions. 7.For safety, perform standing exercises close to a counter, wall, corner, or next to someone.  Copyright  VHI. All rights reserved.  Gaze Stabilization: Standing Feet Apart   Feet shoulder width apart, keeping eyes on target on wall 3 feet away, tilt head down slightly and move head side to side for 30 seconds. Repeat while moving head up and down for 30 seconds. Do 2 sessions per day.   Copyright  VHI. All rights reserved.   Feet Apart, Varied Arm Positions - Eyes Closed    Stand with feet shoulder width apart and arms at your side. Close eyes and visualize upright position. Hold _30___ seconds. Repeat __3__ times per session. Do __2__ sessions per day.  Copyright  VHI. All rights reserved.   Feet Together, Head Motion - Eyes Open    With eyes open, feet together, move head slowly: up and down 5 times. Repeat moving your head side to side 5 times. Do _2___ sessions per day.  Copyright  VHI. All rights reserved.   Feet Apart (Compliant Surface) Varied Arm Positions - Eyes Open    With eyes open,  standing on compliant surface: Pillow or Folded towel, feet shoulder width apart and arms at your side, look at a stationary object. Hold __30-60__ seconds. Do __2__ sessions per day.  Copyright  VHI. All rights reserved.    ONLY DO THIS EXERCISE IF VERTIGO RETURNS (you note dizziness when looking up, lying down, or rolling in bed) Perform until 2 days without symptoms.  Tip Card 1.The goal of habituation training is to assist in decreasing symptoms of vertigo, dizziness, or nausea provoked by specific head and body motions. 2.These exercises may initially increase symptoms; however, be persistent and work through symptoms. With repetition and time, the exercises will assist in reducing or eliminating symptoms. 3.Exercises should be stopped and discussed with the therapist if you experience any of the following: - Sudden change or fluctuation in hearing - New onset of ringing in the ears, or increase in current intensity - Any fluid discharge from the ear - Severe pain in neck or back - Extreme nausea  Sit to Side-Lying   Sit on edge of bed. Lie down onto the right side and hold until dizziness stops, plus 20 seconds. Return to sitting and wait until dizziness stops, plus 20 seconds. Repeat to the left side. Repeat sequence 5 times per session. Do 2 sessions per day.

## 2015-08-22 NOTE — Therapy (Signed)
[facility] [address] [place], [place], [address] Phone: [phone]   Fax:  [phone]  Patient Details  Name: Seth Young MRN: [id number] Date of Birth: [birth year] Referring Provider:  No ref. provider found  Encounter Date: 08/22/2015  PHYSICAL THERAPY DISCHARGE SUMMARY  Visits from Start of Care: 5  Current functional level related to goals / functional outcomes:     PT Long Term Goals - 08/22/15 0809    PT LONG TERM GOAL #1   Title Positional vertigo testing will be negative to indicate resolved BPPV.  Target date: 08/28/15   Baseline No vertigo or dizziness with R sidelying.   Status Achieved   PT LONG TERM GOAL #2   Title Pt will decrease DHI score from 38 to < / = 20 to indicate significant decrease in pt-perceived disability due to dizziness. Target date: 08/28/15   Baseline Pt scores 6% on DHI at discharge.   Status Achieved   PT LONG TERM GOAL #3   Title Assess strength and balance, if indicated, after vertigo clears. Target date: 08/28/15   Baseline Pt scores 44/56 on Berg, gait speed=3.20 ft/sec.  Has improved single leg control on L leg vs. R (h/o R knee surgery)   Status Achieved   PT LONG TERM GOAL #4   Title Patient will maintain feet together eyes closed x 30 seconds to demonstrate improved balance during multi-sensory environments.   Baseline Feet not touching, but narrow - can do x 30 seconds.   Status Partially Met   PT LONG TERM GOAL #5   Title The patient will perform home walking program to increase tolerance to movement for improved balance and mgmt of dizziness.    Baseline Target date 08/28/2015   Status Achieved   PT LONG TERM GOAL #6   Title The patient will improve Berg score from 44/56 to > or equal to 48/56 to demo decreasing risk for falls.   Baseline Scores 50/56.   Status Achieved        Remaining deficits: Decreased high level balance involving multi-sensory situations   Education / Equipment: Home program, community wellness, fall prevention, nature of vertigo, self mgmt of chronic conditions  Plan: Patient agrees to discharge.  Patient goals were met. Patient is being discharged due to meeting the stated rehab goals.  ?????       Thank you for the referral of this patient. Rudell Cobb, MPT  Lynisha Osuch 08/22/2015, 8:36 AM  [facility] [address] [place], [place], [address] Phone: [phone]   Fax:  [phone]

## 2015-08-25 ENCOUNTER — Encounter: Payer: Medicare Other | Admitting: Physical Therapy

## 2015-08-29 ENCOUNTER — Encounter: Payer: Medicare Other | Admitting: Rehabilitative and Restorative Service Providers"

## 2015-08-30 ENCOUNTER — Other Ambulatory Visit: Payer: Self-pay | Admitting: Physician Assistant

## 2015-09-04 ENCOUNTER — Encounter: Payer: Medicare Other | Admitting: Rehabilitative and Restorative Service Providers"

## 2015-09-11 ENCOUNTER — Encounter: Payer: Medicare Other | Admitting: Rehabilitative and Restorative Service Providers"

## 2015-09-16 DIAGNOSIS — E118 Type 2 diabetes mellitus with unspecified complications: Secondary | ICD-10-CM | POA: Diagnosis not present

## 2015-09-17 ENCOUNTER — Encounter: Payer: Self-pay | Admitting: Cardiology

## 2015-09-17 ENCOUNTER — Ambulatory Visit (INDEPENDENT_AMBULATORY_CARE_PROVIDER_SITE_OTHER): Payer: Medicare Other | Admitting: Cardiology

## 2015-09-17 VITALS — BP 130/66 | HR 76 | Ht 71.5 in | Wt 241.4 lb

## 2015-09-17 DIAGNOSIS — E7800 Pure hypercholesterolemia, unspecified: Secondary | ICD-10-CM

## 2015-09-17 DIAGNOSIS — I209 Angina pectoris, unspecified: Secondary | ICD-10-CM

## 2015-09-17 DIAGNOSIS — I10 Essential (primary) hypertension: Secondary | ICD-10-CM | POA: Diagnosis not present

## 2015-09-17 NOTE — Patient Instructions (Signed)
Medication Instructions:  Your physician recommends that you continue on your current medications as directed. Please refer to the Current Medication list given to you today.  Labwork: none  Testing/Procedures: none  Follow-Up: Your physician recommends that you schedule a follow-up appointment in: 4 month ov with Dr Marlou Porch  If you need a refill on your cardiac medications before your next appointment, please call your pharmacy.

## 2015-09-17 NOTE — Progress Notes (Signed)
Cardiology Office Note   Date:  09/17/2015   ID:  Seth Young, DOB 08/26/1940, MRN [id number]  PCP:  Geoffery Lyons, MD  Cardiologist: Darlin Coco MD  No chief complaint on file.     History of Present Illness: Seth Young is a 75 y.o. male who presents for Scheduled follow-up visit  HPI: The patient is a 75 year old male with a prior history of insulin-dependent diabetes mellitus, hypertension and hyperlipidemia and CAD, who is seen for a scheduled follow-up office visit.  He was admitted to [facility] on 06/26/2014 to undergo selective left heart catheterization in the setting of recent chest pain and an abnormal nuclear stress test. He was found to have diffuse severe diabetic disease, with 50% proximal LAD stenosis, 25-30% mid LAD stenosis, and 50-60% diffuse apical LAD stenosis. The first diagonal branch is severely diseased in diffuse fashion with 80% ostial and 70% mid vessel stenosis. The left circumflex is diffusely diseased. It is a large caliber vessel. The proximal circumflex has 50% stenosis. The mid circumflex has scattered 30% stenoses. The first obtuse marginal was observed to be subtotally occluded with 99% stenosis. The second obtuse marginal is very small with 70% ostial stenosis. The left posterior lateral branches are large in caliber without significant disease. The right coronary artery is dominant. The vessel has very severe distal stenosis in a diabetic pattern with diffuse disease noted distally. The proximal vessel has diffuse irregularity. The entire mid vessel has 80% stenosis. The PDA branch has 95% stenosis and it is extremely small in caliber, visually less than 1 mm. Left ventriculography revealed normal LV function with EF of 55%. The culprit vessel was felt to be the OM1. This was successfully treated with PCI utilizing a drug-eluting stent. He tolerated the procedure well and was placed on dual antiplatelet therapy with aspirin plus  Effient. He was also placed on a beta blocker and his Lipitor was increased to 40 mg daily. Since last visit he has been doing well.He has had rare episodes of chest discomfort which have not required sublingual nitroglycerin. He has a history of peripheral neuropathy which is quite painful at times. He has no longer taking Lyrica.  It was causing him to have abnormal thoughts. Since last visit the patient underwent successful left carotid endarterectomy on 04/23/15 by Dr. Bridgett Larsson. The patient exercises in the summer by swimming. In the winter he uses an exercise bicycle. He also has a treadmill at home but does not use it much because of problems with his knees. The patient has not had to take any sublingual nitroglycerin since last visit. In December 2016 the patient had an episode of severe and prolonged vertigo.  He had to go to the emergency room.  He was given a trial of meclizine but the side effects were difficult for him.  His balance has gradually improved. The patient had an echocardiogram on 07/21/15: TTE (07/21/15) - Left ventricle: The cavity size was normal. There was severe focal basal and mild concentric hypertrophy of the left ventricle. Systolic function was vigorous. The estimated ejection fraction was in the range of 65% to 70%. Wall motion was normal; there were no regional wall motion abnormalities. Doppler parameters are consistent with abnormal left ventricular relaxation (grade 1 diastolic dysfunction). Doppler parameters are consistent with elevated ventricular end-diastolic filling pressure.  Past Medical History  Diagnosis Date  . Hypertension   . Colon polyps 10-12-11    past hx.  . Exertional angina ([place])  06/26/2014    Cath with DES to the OM, Bioflow protocol   . High cholesterol   . Pneumonia 1940's X 2  . Type II diabetes mellitus ([place]) dx'd 1982    nsulin started ~ 2000  . H/O hiatal hernia     no problems  . GERD (gastroesophageal  reflux disease)   . Arthritis     "knees, toes, hands" (06/26/2014)  . Pseudogout     "it moves around"  . Ischemic chest pain ([place]) 08/07/2014  . Sleep apnea     no cpap used  . Basal cell carcinoma     left temporal area-no residual  . Peripheral neuropathy ([place])   . Occasional tremors     Bilateral hands  . Vertigo   . Ataxia     Past Surgical History  Procedure Laterality Date  . Back surgery    . Nasal septum surgery  1979  . Carpal tunnel release Left 10/2009  . Knee arthroscopy Left 05/2011    10'12-left knee  . Shoulder open rotator cuff repair  10/14/2011    Procedure: ROTATOR CUFF REPAIR SHOULDER OPEN;  Surgeon: Johnn Hai, MD;  Location: [facility];  Service: Orthopedics;  Laterality: Right;  . Subacromial decompression  10/14/2011    Procedure: SUBACROMIAL DECOMPRESSION;  Surgeon: Johnn Hai, MD;  Location: [facility];  Service: Orthopedics;  Laterality: Right;  . Cardiac catheterization  02/2005    Dr. Mare Ferrari; negative.  . Coronary angioplasty with stent placement  06/26/2014    "1"  . Posterior lumbar fusion  1964  . Carpal tunnel release Right ~ 2013  . Cataract extraction w/ intraocular lens implant Left 2000's  . Cataract extraction w/ intraocular lens implant Right 2015  . Elbow surgery Bilateral ~ 2014    "for blockages; Dr. Amedeo Plenty"  . Trigger finger release Right ~ 2013-2014    "3rd & 4th digits"  . Basal cell carcinoma excision Left ~ 2012    face  . Coronary angioplasty with stent placement  06/26/2014    pLAD 50%, d LAD 60%, oD1 80%,  mD1  70%, CFX 50%, OM 2 70%,  RCA 80%, PDA 95% (<1mm), OM1 99%-0% with Bio flow stent       . Left heart catheterization with coronary angiogram N/A 06/26/2014    Procedure: LEFT HEART CATHETERIZATION WITH CORONARY ANGIOGRAM;  Surgeon: Blane Ohara, MD;  Location: [facility] CATH LAB;  Service: Cardiovascular;  Laterality: N/A;  . Left heart catheterization with coronary angiogram N/A 08/07/2014    Procedure: LEFT HEART  CATHETERIZATION WITH CORONARY ANGIOGRAM;  Surgeon: Blane Ohara, MD;  Location: [facility] CATH LAB;  Service: Cardiovascular;  Laterality: N/A;  . Colonoscopy w/ polypectomy    . Endarterectomy Left 04/23/2015    Procedure: ENDARTERECTOMY CAROTID;  Surgeon: Conrad Wrightsville, MD;  Location: [place];  Service: Vascular;  Laterality: Left;  . Patch angioplasty Left 04/23/2015    Procedure: PATCH ANGIOPLASTY USING 1CM X 6CM XENOSURE BIOLOGIC PATCH;  Surgeon: Conrad Sturtevant, MD;  Location: [place];  Service: Vascular;  Laterality: Left;  . Carotid endarterectomy Left Ssept. 14, 2016    CEA     Current Outpatient Prescriptions  Medication Sig Dispense Refill  . acetaminophen (TYLENOL) 325 MG tablet Take 650 mg by mouth at bedtime as needed (sleep).    Marland Kitchen aspirin 81 MG tablet Take 1 tablet (81 mg total) by mouth daily.    Marland Kitchen atorvastatin (LIPITOR) 40 MG tablet TAKE ONE TABLET BY  MOUTH ONCE DAILY BEFORE  BREAKFAST 30 tablet 10  . canagliflozin (INVOKANA) 100 MG TABS tablet Take 100 mg by mouth daily.    . colchicine 0.6 MG tablet Take 0.6 mg by mouth as needed (gout).    Marland Kitchen diphenhydrAMINE (BENADRYL) 25 MG tablet Take 25 mg by mouth at bedtime as needed for sleep.    . dorzolamide-timolol (COSOPT) 22.3-6.8 MG/ML ophthalmic solution Place 1 drop into both eyes 2 (two) times daily.    . insulin glargine (LANTUS) 100 UNIT/ML injection Inject 66 Units into the skin at bedtime.     . insulin lispro (HUMALOG) 100 UNIT/ML injection Inject 0-30 Units into the skin daily as needed for high blood sugar (sliding scale). Sliding scale      . isosorbide mononitrate (IMDUR) 30 MG 24 hr tablet Take 30 mg by mouth daily.    Marland Kitchen lisinopril (PRINIVIL,ZESTRIL) 10 MG tablet TAKE ONE TABLET BY MOUTH ONCE DAILY BEFORE BREAKFAST 90 tablet 2  . metFORMIN (GLUCOPHAGE) 1000 MG tablet TAKE ONE TABLET BY MOUTH TWICE DAILY WITH A MEAL 60 tablet 0  . metoprolol tartrate (LOPRESSOR) 25 MG tablet TAKE ONE TABLET BY MOUTH TWICE DAILY 60 tablet 5  .  Multiple Vitamins-Minerals (ZINC PO) Take 1 tablet by mouth daily.    . nitroGLYCERIN (NITROSTAT) 0.4 MG SL tablet Place 1 tablet (0.4 mg total) under the tongue every 5 (five) minutes as needed for chest pain. 25 tablet 3  . Omega-3 Fatty Acids (FISH OIL PO) Take 1 capsule by mouth daily.    Marland Kitchen omeprazole (PRILOSEC) 40 MG capsule Take 40 mg by mouth 2 (two) times daily.    . prasugrel (EFFIENT) 10 MG TABS tablet Take 10 mg by mouth daily.    . Probiotic Product (PROBIOTIC PO) Take 1 tablet by mouth daily.     No current facility-administered medications for this visit.    Allergies:   Gabapentin; Morphine and related; and Naproxen    Social History:  The patient  reports that he quit smoking about 26 years ago. His smoking use included Cigarettes. He has a 10 pack-year smoking history. He has never used smokeless tobacco. He reports that he does not drink alcohol or use illicit drugs.   Family History:  The patient's family history includes Cancer in his sister; Diabetes in his brother, mother, and sister; Heart attack (age of onset: 59) in his father; Hypertension in his brother and sister; Varicose Veins in his mother and sister.    ROS:  Please see the history of present illness.   Otherwise, review of systems are positive for none.   All other systems are reviewed and negative.    PHYSICAL EXAM: VS:  BP 130/66 mmHg  Pulse 76  Ht 5' 11.5" (1.816 m)  Wt 241 lb 6.4 oz (109.498 kg)  BMI 33.20 kg/m2 , BMI Body mass index is 33.2 kg/(m^2). GEN: Well nourished, well developed, in no acute distress HEENT: normal Neck: no JVD, carotid bruits, or masses Cardiac: RRR; no murmurs, rubs, or gallops,no edema  Respiratory:  clear to auscultation bilaterally, normal work of breathing GI: soft, nontender, nondistended, + BS MS: no deformity or atrophy Skin: warm and dry, no rash Neuro:  Strength and sensation are intact Psych: euthymic mood, full affect   EKG:  EKG is ordered today. The  ekg ordered today demonstrates No sinus rhythm at 76 bpm.  First-degree AV block.  No ischemic changes.   Recent Labs: 04/18/2015: ALT 27 07/21/2015: BUN 13; Creatinine, Ser  1.16; Hemoglobin 14.6; Platelets 182; Potassium 4.3; Sodium 136    Lipid Panel No results found for: CHOL, TRIG, HDL, CHOLHDL, VLDL, LDLCALC, LDLDIRECT    Wt Readings from Last 3 Encounters:  09/17/15 241 lb 6.4 oz (109.498 kg)  07/29/15 238 lb (107.956 kg)  07/23/15 240 lb (108.863 kg)        ASSESSMENT AND PLAN:  1. CAD: Pt with CAD with diffuse severe diabetic disease. Status post PCI plus drug-eluting stenting to the OM1 vessel. He has had no recurrent anginal symptoms. Continue dual antiplatelet therapy with aspirin plus Effient as well as Metroprolol and Lipitor. Given drug-eluting stent, he will need to continue dual antiplatelet therapy for a minimum of 12 months. It was noted in his cath report that he has very poor targets for grafting, but he could be considered for surgical referral down the road if he develops proximal LAD stenosis (now at 50%). We will continue to follow him on a routine basis.He is not having any significant angina pectoris.  2. Hypertension: Well-controlled . Continue isosorbide mononitrate and lisinopril And metoprolol 3. Carotid artery disease. Patient underwent left carotid endarterectomy 04/23/15. No TIA symptoms. 4. Hyperlipidemia: Continue statin therapy with Lipitor. His PCP will follow this. Goal LDL <70. He is on atorvastatin  5. Insulin-dependent diabetes mellitus: Followed and managed by PCP. Most recent hemoglobin A1c was 7.0 Current medicines are reviewed at length with the patient today. The patient does not have concerns regarding medicines.   Current medicines are reviewed at length with the patient today.  The patient does not have concerns regarding medicines.  The following changes have been made:  no change  Labs/ tests ordered today include:   Orders  Placed This Encounter  Procedures  . EKG 12-Lead     Disposition:   Continue current medication.  Following my retirement he will return in 4 months with Dr. Marlou Porch  Signed, Darlin Coco MD 09/17/2015 1:19 PM    [address], [place], [place]  [address] Phone: [phone]; Fax: [phone]

## 2015-10-06 DIAGNOSIS — E118 Type 2 diabetes mellitus with unspecified complications: Secondary | ICD-10-CM | POA: Diagnosis not present

## 2015-10-06 DIAGNOSIS — I6522 Occlusion and stenosis of left carotid artery: Secondary | ICD-10-CM | POA: Diagnosis not present

## 2015-10-06 DIAGNOSIS — E11319 Type 2 diabetes mellitus with unspecified diabetic retinopathy without macular edema: Secondary | ICD-10-CM | POA: Diagnosis not present

## 2015-10-06 DIAGNOSIS — F3289 Other specified depressive episodes: Secondary | ICD-10-CM | POA: Diagnosis not present

## 2015-10-06 DIAGNOSIS — R0989 Other specified symptoms and signs involving the circulatory and respiratory systems: Secondary | ICD-10-CM | POA: Diagnosis not present

## 2015-10-06 DIAGNOSIS — K219 Gastro-esophageal reflux disease without esophagitis: Secondary | ICD-10-CM | POA: Diagnosis not present

## 2015-10-06 DIAGNOSIS — Z95828 Presence of other vascular implants and grafts: Secondary | ICD-10-CM | POA: Diagnosis not present

## 2015-10-06 DIAGNOSIS — R42 Dizziness and giddiness: Secondary | ICD-10-CM | POA: Diagnosis not present

## 2015-10-06 DIAGNOSIS — Z794 Long term (current) use of insulin: Secondary | ICD-10-CM | POA: Diagnosis not present

## 2015-10-06 DIAGNOSIS — G629 Polyneuropathy, unspecified: Secondary | ICD-10-CM | POA: Diagnosis not present

## 2015-10-25 ENCOUNTER — Other Ambulatory Visit: Payer: Self-pay | Admitting: Physician Assistant

## 2015-10-28 ENCOUNTER — Other Ambulatory Visit: Payer: Self-pay

## 2015-10-30 ENCOUNTER — Ambulatory Visit (INDEPENDENT_AMBULATORY_CARE_PROVIDER_SITE_OTHER): Payer: Medicare Other | Admitting: Neurology

## 2015-10-30 ENCOUNTER — Telehealth: Payer: Self-pay

## 2015-10-30 ENCOUNTER — Encounter: Payer: Self-pay | Admitting: Neurology

## 2015-10-30 VITALS — BP 121/73 | HR 83 | Resp 18 | Ht 71.5 in | Wt 241.0 lb

## 2015-10-30 DIAGNOSIS — J00 Acute nasopharyngitis [common cold]: Secondary | ICD-10-CM

## 2015-10-30 DIAGNOSIS — R269 Unspecified abnormalities of gait and mobility: Secondary | ICD-10-CM | POA: Diagnosis not present

## 2015-10-30 DIAGNOSIS — IMO0002 Reserved for concepts with insufficient information to code with codable children: Secondary | ICD-10-CM

## 2015-10-30 DIAGNOSIS — H8110 Benign paroxysmal vertigo, unspecified ear: Secondary | ICD-10-CM | POA: Diagnosis not present

## 2015-10-30 NOTE — Progress Notes (Signed)
Subjective:    Patient ID: Seth Young is a 75 y.o. male.  HPI     Interim history:   Seth Young is a 75 year old right-handed gentleman with an underlying complex medical history of diabetes, complicated by retinopathy, peripheral neuropathy, history of obstructive sleep apnea, COPD, coronary artery disease, status post stent placement, depression, obesity, hyperlipidemia, hypertension, carotid artery disease, status post left carotid endarterectomy in 2016, and osteoarthritis, who presents presents for follow-up consultation of his vertigo. The patient is accompanied by his wife againg today. I first met him on 07/29/2015 at the request of his primary care physician, at which time he reported sudden onset of spinning sensation with body position and head position changes associated with nausea, no vomiting. He presented to the emergency room on 07/21/2015. I reviewed the emergency room records at the time. His exam was nonfocal at the time. He had recently had workup in the form of brain MRI, carotid Doppler studies, neck MRA and echocardiogram. I suggested vestibular rehabilitation and made a referral.   Today, 10/30/2015: He reports that he did quite well with physical therapy and his symptoms improved her work on but about a week ago he restarted having attacks of vertigo and is quite distressed about it. Of note, he has also had nasal congestion, cold symptoms, upper respiratory symptoms and his primary care physician prescribed an antibiotic that he started on 10/28/2015 for 1 week. He still has those exercises that physical therapy gave him. He felt improved enough that he did not actually keep his ENT referral. I had referred him to [facility] ENT when I first saw him. He then made another appointment at a different ENT office as I understand but this appointment is not until June or so. He is worried about his recurrence of symptoms. Nothing new has happened. No new headache, no one-sided  weakness, no numbness, no tingling. He does have neuropathy. Latest A1c was 7.8 or 7.9, slightly suboptimal. I reviewed his physical therapy notes from neuro rehabilitation. He was discharged in late January from physical therapy..   Previously:   07/29/2015: He presents with new onset vertigo which started suddenly on 07/17/2015. He had a sensation of the room spinning particularly with head movements and sudden body position changes. He felt queasy as and nauseated but did not have any vomiting or any severe nausea. He saw you in the office and was given a prescription for meclizine which helped his vertigo symptoms but he felt wobbly in his legs and started having problems with balance and his gait. Thankfully he did not fall. He presented to the emergency room on 07/21/2015 with new onset difficulty with his walking. He described ongoing issues with vertiginous symptoms and some lightheadedness was reported as well. I reviewed the emergency room records from 07/21/2015. Overall, he currently feels improved, but not quite back to baseline. He denies hearing loss or tinnitus. He denies any one-sided weakness, numbness, slurring of speech, droopy face or recurrent headaches. He does have a history of compression neuropathies and had surgeries to both elbows several years ago. He has some crypts strength weakness which is not new and he has a history of peripheral neuropathy, likely secondary to diabetes of many years duration. He has a cane. He has not used it today but has used it in the recent past because of gait difficulties. Years ago he saw ENT for chronic sinus issues and losing his sense of smell. Of note, prior to his vertigo symptoms he started  having cold symptoms and still has some residual congestion.  Of note, he carries a diagnosis of obstructive sleep apnea and was never treated with CPAP therapy and his symptoms improved after he lost weight. His wife reports no significant snoring at this  time and no apneic breathing pauses while he is asleep.  Of note, a couple of years ago he had a similar vertigo attack but it was not as severe as this one he recalls.  He had an echocardiogram on 07/21/2015 which showed an EF of 65-70% and grade 1 diastolic dysfunction. There was mild to moderate aortic regurgitation, trivial mitral regurgitation and overall benign findings. I reviewed your office note from 07/29/2015, which you kindly included.   He had a brain MRI without contrast on 07/21/2015, and I reviewed the results:  Age related atrophy. No evidence of old or acute focal insult. No specific cause of the presenting symptoms is identified.  In addition, I personally reviewed the images through the PACS system, and I agree with the findings but his atrophy appears to be advanced for age to my review.  He follows with Dr. Bridgett Larsson in vascular surgery for his carotid artery disease.  He also had carotid artery Doppler study on 07/18/2015, which showed patent left carotid endarterectomy site with no evidence of hyperplasia or restenosis. Resistive flow noted in the common carotid and external carotid artery. This was a left-sided exam only and his first postoperative exam. Subsequently, he had a CT angiogram of his neck and aortic arch on 07/22/2015: Status post LEFT carotid endarterectomy.  No adverse features.   Non stenotic plaque RIGHT internal carotid artery.   No posterior circulation abnormalities of significance.   Moderate to severe spinal stenosis at C4-5 related to spondylosis and OPLL.  His Past Medical History Is Significant For: Past Medical History  Diagnosis Date  . Hypertension   . Colon polyps 10-12-11    past hx.  . Exertional angina ([place]) 06/26/2014    Cath with DES to the OM, Bioflow protocol   . High cholesterol   . Pneumonia 1940's X 2  . Type II diabetes mellitus ([place]) dx'd 1982    nsulin started ~ 2000  . H/O hiatal hernia     no problems  . GERD  (gastroesophageal reflux disease)   . Arthritis     "knees, toes, hands" (06/26/2014)  . Pseudogout     "it moves around"  . Ischemic chest pain ([place]) 08/07/2014  . Sleep apnea     no cpap used  . Basal cell carcinoma     left temporal area-no residual  . Peripheral neuropathy ([place])   . Occasional tremors     Bilateral hands  . Vertigo   . Ataxia     His Past Surgical History Is Significant For: Past Surgical History  Procedure Laterality Date  . Back surgery    . Nasal septum surgery  1979  . Carpal tunnel release Left 10/2009  . Knee arthroscopy Left 05/2011    10'12-left knee  . Shoulder open rotator cuff repair  10/14/2011    Procedure: ROTATOR CUFF REPAIR SHOULDER OPEN;  Surgeon: Johnn Hai, MD;  Location: [facility];  Service: Orthopedics;  Laterality: Right;  . Subacromial decompression  10/14/2011    Procedure: SUBACROMIAL DECOMPRESSION;  Surgeon: Johnn Hai, MD;  Location: [facility];  Service: Orthopedics;  Laterality: Right;  . Cardiac catheterization  02/2005    Dr. Mare Ferrari; negative.  . Coronary angioplasty with stent placement  06/26/2014    "1"  . Posterior lumbar fusion  1964  . Carpal tunnel release Right ~ 2013  . Cataract extraction w/ intraocular lens implant Left 2000's  . Cataract extraction w/ intraocular lens implant Right 2015  . Elbow surgery Bilateral ~ 2014    "for blockages; Dr. Amedeo Plenty"  . Trigger finger release Right ~ 2013-2014    "3rd & 4th digits"  . Basal cell carcinoma excision Left ~ 2012    face  . Coronary angioplasty with stent placement  06/26/2014    pLAD 50%, d LAD 60%, oD1 80%,  mD1  70%, CFX 50%, OM 2 70%,  RCA 80%, PDA 95% (<21m), OM1 99%-0% with Bio flow stent       . Left heart catheterization with coronary angiogram N/A 06/26/2014    Procedure: LEFT HEART CATHETERIZATION WITH CORONARY ANGIOGRAM;  Surgeon: MBlane Ohara MD;  [facility];  Service: Cardiovascular;  Laterality: N/A;  . Left heart catheterization  with coronary angiogram N/A 08/07/2014    Procedure: LEFT HEART CATHETERIZATION WITH CORONARY ANGIOGRAM;  Surgeon: MBlane Ohara MD;  [facility];  Service: Cardiovascular;  Laterality: N/A;  . Colonoscopy w/ polypectomy    . Endarterectomy Left 04/23/2015    Procedure: ENDARTERECTOMY CAROTID;  Surgeon: BConrad Labette MD;  [place] [facility]  Service: Vascular;  Laterality: Left;  . Patch angioplasty Left 04/23/2015    Procedure: PATCH ANGIOPLASTY USING 1CM X 6CM XENOSURE BIOLOGIC PATCH;  Surgeon: BConrad  MD;  [facility]  Service: Vascular;  Laterality: Left;  . Carotid endarterectomy Left Ssept. 14, 2016    CEA    His Family History Is Significant For: Family History  Problem Relation Age of Onset  . Diabetes Mother   . Varicose Veins Mother   . Heart attack Father 660   "massive heart attack"  . Cancer Sister   . Diabetes Sister   . Hypertension Sister   . Varicose Veins Sister   . Diabetes Brother   . Hypertension Brother     His Social History Is Significant For: Social History   Social History  . Marital Status: Married    Spouse Name: N/A  . Number of Children: 5  . Years of Education: College   Social History Main Topics  . Smoking status: Former Smoker -- 0.50 packs/day for 20 years    Types: Cigarettes    Quit date: 10/11/1988  . Smokeless tobacco: Never Used  . Alcohol Use: No     Comment: Quit drinking in 1990  . Drug Use: No  . Sexual Activity: No   Other Topics Concern  . None   Social History Narrative   Drinks about 2 cups of coffee a day, occasional diet pepsi.     His Allergies Are:  Allergies  Allergen Reactions  . Gabapentin     Ataxia  . Morphine And Related Itching    Itching   . Naproxen Other (See Comments)    Tremors, hallucinates   :   His Current Medications Are:  Outpatient Encounter Prescriptions as of 10/30/2015  Medication Sig  . acetaminophen (TYLENOL) 325 MG tablet Take 650 mg by mouth at bedtime as  needed (sleep).  .Marland Kitchenaspirin 81 MG tablet Take 1 tablet (81 mg total) by mouth daily.  .Marland Kitchenatorvastatin (LIPITOR) 40 MG tablet TAKE ONE TABLET BY MOUTH ONCE DAILY BEFORE  BREAKFAST  . canagliflozin (INVOKANA) 100 MG TABS tablet Take 100 mg by  mouth daily.  . cefdinir (OMNICEF) 300 MG capsule   . colchicine 0.6 MG tablet Take 0.6 mg by mouth as needed (gout).  Marland Kitchen diphenhydrAMINE (BENADRYL) 25 MG tablet Take 25 mg by mouth at bedtime as needed for sleep.  . dorzolamide-timolol (COSOPT) 22.3-6.8 MG/ML ophthalmic solution Place 1 drop into both eyes 2 (two) times daily.  . insulin glargine (LANTUS) 100 UNIT/ML injection Inject 66 Units into the skin at bedtime.   . insulin lispro (HUMALOG) 100 UNIT/ML injection Inject 0-30 Units into the skin daily as needed for high blood sugar (sliding scale). Sliding scale    . isosorbide mononitrate (IMDUR) 30 MG 24 hr tablet Take 30 mg by mouth daily.  Marland Kitchen lisinopril (PRINIVIL,ZESTRIL) 10 MG tablet TAKE ONE TABLET BY MOUTH ONCE DAILY BEFORE BREAKFAST  . meclizine (ANTIVERT) 25 MG tablet Take 25 mg by mouth 3 (three) times daily as needed for dizziness.  . metFORMIN (GLUCOPHAGE) 1000 MG tablet TAKE ONE TABLET BY MOUTH TWICE DAILY WITH A MEAL  . metoprolol tartrate (LOPRESSOR) 25 MG tablet TAKE ONE TABLET BY MOUTH TWICE DAILY  . Multiple Vitamins-Minerals (ZINC PO) Take 1 tablet by mouth daily.  . nitroGLYCERIN (NITROSTAT) 0.4 MG SL tablet Place 1 tablet (0.4 mg total) under the tongue every 5 (five) minutes as needed for chest pain.  . Omega-3 Fatty Acids (FISH OIL PO) Take 1 capsule by mouth daily.  Marland Kitchen omeprazole (PRILOSEC) 40 MG capsule Take 40 mg by mouth 2 (two) times daily.  . prasugrel (EFFIENT) 10 MG TABS tablet Take 10 mg by mouth daily.  . Probiotic Product (PROBIOTIC PO) Take 1 tablet by mouth daily.   No facility-administered encounter medications on file as of 10/30/2015.  :  Review of Systems:  Out of a complete 14 point review of systems, all are  reviewed and negative with the exception of these symptoms as listed below:   Review of Systems  Neurological: Positive for dizziness.       Patient reports that his dizziness went away with PT but the dizziness came back a week ago.     Objective:  Neurologic Exam  Physical Exam Physical Examination:   Filed Vitals:   10/30/15 0926  BP: 121/73  Pulse: 83  Resp: 18   General Examination: The patient is a very pleasant 75 y.o. male in no acute distress. He appears well-developed and well-nourished and very well groomed. He is mildly anxious appearing, somewhat frustrated about his situation.   HEENT: Normocephalic, atraumatic, pupils are equal, round and reactive to light and accommodation. He is status post bilateral cataract repairs. Extraocular tracking is good without limitation to gaze excursion or nystagmus noted. Normal smooth pursuit is noted. He overall feels slightly unsteady but has no significant vertiginous symptoms with head movements or head extension. He has an unremarkable scar from his left carotid endarterectomy. Hearing is grossly intact. Face is symmetric with normal facial animation and normal facial sensation. Speech is clear with no dysarthria noted. There is no hypophonia. There is no lip, neck/head, jaw or voice tremor. Neck is supple with full range of passive and active motion. There are no carotid bruits on auscultation. Oropharynx exam reveals: mild mouth dryness, adequate dental hygiene and moderate airway crowding, due to larger tongue, redundant soft palate and large uvula. Mallampati is class II. Tongue protrudes centrally and palate elevates symmetrically.   Chest: Clear to auscultation without wheezing, rhonchi or crackles noted.  Heart: S1+S2+0, regular and normal without murmurs, rubs or gallops noted.  Abdomen: Soft, non-tender and non-distended with normal bowel sounds appreciated on auscultation.  Extremities: There is no pitting edema in the  distal lower extremities bilaterally. Pedal pulses are intact.  Skin: Warm and dry without trophic changes noted. There are no varicose veins.   Musculoskeletal: exam reveals no obvious joint deformities, tenderness or joint swelling or erythema.   Neurologically:  Mental status: The patient is awake, alert and oriented in all 4 spheres. His immediate and remote memory, attention, language skills and fund of knowledge are appropriate. There is no evidence of aphasia, agnosia, apraxia or anomia. Speech is clear with normal prosody and enunciation. Thought process is linear. Mood is normal and affect is normal.  Cranial nerves II - XII are as described above under HEENT exam. In addition: shoulder shrug is normal with equal shoulder height noted. Motor exam: Normal bulk, strength and tone is noted, with the exception of mild atrophy noted in the intrinsic hand muscles and snuffbox. He has mild grip strength weakness bilaterally. There is no drift, tremor or rebound. Romberg shows slight sway. Reflexes are 2+in the upper extremities and 1+ in the knees, absent in both ankles. Fine motor skills and coordination: intact with normal finger taps, normal hand movements, normal rapid alternating patting, normal foot taps and normal foot agility.  Cerebellar testing: No dysmetria or intention tremor on finger to nose testing. Heel to shin is unremarkable bilaterally. There is no truncal or gait ataxia.  Sensory exam: intact to light touch, pinprick, vibration, temperature sense in the upper extremities, with decrease to all modalities in the lower extremities up to mid calf areas bilaterally, unchanged .  Gait, station and balance: He stands slowly and with caution. He stands slightly wide-based. He walks cautiously. Tandem walk is not possible for him, but he tried today but was insecure. He brought in a cane, he walks a little bit more securely with the cane.  Assessment and Plan:   In summary, Seth Young is a very pleasant 75 year old male with an underlying complex medical history of diabetes, complicated by retinopathy, peripheral neuropathy, history of obstructive sleep apnea, COPD, coronary artery disease, status post stent placement, depression, obesity, hyperlipidemia, hypertension, carotid artery disease, status post left carotid endarterectomy in 2016, and osteoarthritis, who presents For follow-up consultation of his paroxysmal vertigo, this started suddenly on 07/17/2015. He had improved, he finished physical therapy for vestibular rehabilitation and actually had done well by the end of January but had recurrence of symptoms this past week or so. This in the context of having a cold and upper respiratory symptoms. He has some sinus congestion. He has started and antibiotic and has 4 more days to go. He has no focal neurological abnormality on exam and he had recent workup in the form of brain MRI without contrast, carotid Doppler studies, neck MRA and echocardiogram in the recent past. We talked about his test results again today. I had made a referral to ENT but he canceled the appointment because he felt better. We mutually agreed to pursue ENT appointment at this time. Meclizine has been prescribed by his primary care physician and he has tried it but felt a little off balance with it and it can be sedating as well. He can continue to try this but is made aware that it can be sedating and cause other issues with off balance, and sometimes does help vertigo. In addition, he takes 2 Tylenol with Benadryl each night. He has done this for years  or chronically. Benadryl can also be sedating and affect balance. Nevertheless, he has done this chronically so it may not affect him adversely at this time. I do question whether it is actually helping him at this time as antihistamines are not helpful for sleep on a chronic basis. He is advised that vertigo triggers may include cold symptoms, congestion,  sinus infections, swimmers ear, air travel with pressure changes, and he has to be mindful of these triggers. He is advised to change positions slowly, stay well hydrated, continue to proceed with the exercises he was given by PT, usually to be done for safety issues while sitting on the couch or in bed.  His gait disturbance is most likely a combination of things including exacerbation secondary to medication effect, and peripheral neuropathy at baseline. I suggested he use a cane for safety. He is advised to use caution when driving. I would like to see him back routinely in about 3 months or so, sooner if needed. I answered all her questions today and the patient and his wife were in agreement. I spent 25 minutes in total face-to-face time with the patient, more than 50% of which was spent in counseling and coordination of care, reviewing test results, reviewing medication and discussing or reviewing the diagnosis of PPV, its prognosis and treatment options.

## 2015-10-30 NOTE — Telephone Encounter (Signed)
Patient had to cancel appt with ENT due to being sick. I called and made appt with Dr. Simeon Craft [facility] ENT) for Monday March 27 at 1:20pm. Left this information on vm and left their call back number if they need to change appt. [phone])

## 2015-10-30 NOTE — Patient Instructions (Addendum)
Please remember, that vertigo can recur without warning, triggers could be stress, sleep deprivation, dehydration and even taking a bumpy car ride, sometimes an acute illness, even a common (viral) cold, congestion or sinus infection. It can last hours or days. Please change positions slowly and always stay well-hydrated. Physical therapy with particular attention to vestibular rehabilitation can be very helpful. While there is no specific medication that helps with vertigo, some people get relief with as needed use of meclizine. Certain medications can exacerbate vertigo.   We will try to reschedule your ENT appointment.

## 2015-11-03 DIAGNOSIS — H903 Sensorineural hearing loss, bilateral: Secondary | ICD-10-CM | POA: Diagnosis not present

## 2015-11-03 DIAGNOSIS — R42 Dizziness and giddiness: Secondary | ICD-10-CM | POA: Diagnosis not present

## 2015-11-03 DIAGNOSIS — H8111 Benign paroxysmal vertigo, right ear: Secondary | ICD-10-CM | POA: Diagnosis not present

## 2015-11-12 DIAGNOSIS — H348322 Tributary (branch) retinal vein occlusion, left eye, stable: Secondary | ICD-10-CM | POA: Diagnosis not present

## 2015-11-12 DIAGNOSIS — E113593 Type 2 diabetes mellitus with proliferative diabetic retinopathy without macular edema, bilateral: Secondary | ICD-10-CM | POA: Diagnosis not present

## 2015-11-12 DIAGNOSIS — H34232 Retinal artery branch occlusion, left eye: Secondary | ICD-10-CM | POA: Diagnosis not present

## 2015-12-09 DIAGNOSIS — R42 Dizziness and giddiness: Secondary | ICD-10-CM | POA: Diagnosis not present

## 2015-12-10 ENCOUNTER — Encounter: Payer: Self-pay | Admitting: Cardiology

## 2015-12-15 DIAGNOSIS — R42 Dizziness and giddiness: Secondary | ICD-10-CM | POA: Diagnosis not present

## 2015-12-23 DIAGNOSIS — R42 Dizziness and giddiness: Secondary | ICD-10-CM | POA: Diagnosis not present

## 2015-12-25 DIAGNOSIS — R42 Dizziness and giddiness: Secondary | ICD-10-CM | POA: Diagnosis not present

## 2015-12-29 ENCOUNTER — Other Ambulatory Visit: Payer: Self-pay | Admitting: *Deleted

## 2015-12-29 MED ORDER — METOPROLOL TARTRATE 25 MG PO TABS: 25.0000 mg | ORAL_TABLET | Freq: Two times a day (BID) | ORAL | Status: DC

## 2015-12-29 MED ORDER — ISOSORBIDE MONONITRATE ER 30 MG PO TB24: 30.0000 mg | ORAL_TABLET | Freq: Every day | ORAL | Status: DC

## 2015-12-30 DIAGNOSIS — R42 Dizziness and giddiness: Secondary | ICD-10-CM | POA: Diagnosis not present

## 2016-01-07 DIAGNOSIS — R42 Dizziness and giddiness: Secondary | ICD-10-CM | POA: Diagnosis not present

## 2016-01-12 DIAGNOSIS — Z794 Long term (current) use of insulin: Secondary | ICD-10-CM | POA: Diagnosis not present

## 2016-01-12 DIAGNOSIS — I10 Essential (primary) hypertension: Secondary | ICD-10-CM | POA: Diagnosis not present

## 2016-01-12 DIAGNOSIS — E11319 Type 2 diabetes mellitus with unspecified diabetic retinopathy without macular edema: Secondary | ICD-10-CM | POA: Diagnosis not present

## 2016-01-12 DIAGNOSIS — E118 Type 2 diabetes mellitus with unspecified complications: Secondary | ICD-10-CM | POA: Diagnosis not present

## 2016-01-12 DIAGNOSIS — E669 Obesity, unspecified: Secondary | ICD-10-CM | POA: Diagnosis not present

## 2016-01-12 DIAGNOSIS — R42 Dizziness and giddiness: Secondary | ICD-10-CM | POA: Diagnosis not present

## 2016-01-12 DIAGNOSIS — G629 Polyneuropathy, unspecified: Secondary | ICD-10-CM | POA: Diagnosis not present

## 2016-01-12 DIAGNOSIS — Z95828 Presence of other vascular implants and grafts: Secondary | ICD-10-CM | POA: Diagnosis not present

## 2016-01-12 DIAGNOSIS — E784 Other hyperlipidemia: Secondary | ICD-10-CM | POA: Diagnosis not present

## 2016-01-12 DIAGNOSIS — I6522 Occlusion and stenosis of left carotid artery: Secondary | ICD-10-CM | POA: Diagnosis not present

## 2016-01-13 DIAGNOSIS — R42 Dizziness and giddiness: Secondary | ICD-10-CM | POA: Diagnosis not present

## 2016-01-16 ENCOUNTER — Other Ambulatory Visit: Payer: Self-pay | Admitting: *Deleted

## 2016-01-16 DIAGNOSIS — R42 Dizziness and giddiness: Secondary | ICD-10-CM | POA: Diagnosis not present

## 2016-01-19 ENCOUNTER — Ambulatory Visit: Payer: Medicare Other | Admitting: Cardiology

## 2016-01-22 ENCOUNTER — Encounter: Payer: Self-pay | Admitting: Cardiology

## 2016-01-22 ENCOUNTER — Ambulatory Visit (INDEPENDENT_AMBULATORY_CARE_PROVIDER_SITE_OTHER): Payer: Medicare Other | Admitting: Cardiology

## 2016-01-22 VITALS — BP 118/62 | HR 70 | Ht 71.5 in | Wt 247.2 lb

## 2016-01-22 DIAGNOSIS — Z794 Long term (current) use of insulin: Secondary | ICD-10-CM

## 2016-01-22 DIAGNOSIS — Z9889 Other specified postprocedural states: Secondary | ICD-10-CM

## 2016-01-22 DIAGNOSIS — I10 Essential (primary) hypertension: Secondary | ICD-10-CM

## 2016-01-22 DIAGNOSIS — I2510 Atherosclerotic heart disease of native coronary artery without angina pectoris: Secondary | ICD-10-CM

## 2016-01-22 DIAGNOSIS — G453 Amaurosis fugax: Secondary | ICD-10-CM

## 2016-01-22 DIAGNOSIS — E119 Type 2 diabetes mellitus without complications: Secondary | ICD-10-CM

## 2016-01-22 DIAGNOSIS — IMO0001 Reserved for inherently not codable concepts without codable children: Secondary | ICD-10-CM

## 2016-01-22 DIAGNOSIS — E7800 Pure hypercholesterolemia, unspecified: Secondary | ICD-10-CM | POA: Diagnosis not present

## 2016-01-22 DIAGNOSIS — I2583 Coronary atherosclerosis due to lipid rich plaque: Principal | ICD-10-CM

## 2016-01-22 MED ORDER — CLOPIDOGREL BISULFATE 75 MG PO TABS: 75.0000 mg | ORAL_TABLET | Freq: Every day | ORAL | Status: DC

## 2016-01-22 MED ORDER — METOPROLOL TARTRATE 25 MG PO TABS: 25.0000 mg | ORAL_TABLET | Freq: Two times a day (BID) | ORAL | Status: DC

## 2016-01-22 NOTE — Patient Instructions (Signed)
Medication Instructions:  Please stop Effient and start Plavix 75 mg a day. Continue all other medications as listed.  Follow-Up: Follow up in 6 months with Dr. Marlou Porch.  You will receive a letter in the mail 2 months before you are due.  Please call us when you receive this letter to schedule your follow up appointment.  If you need a refill on your cardiac medications before your next appointment, please call your pharmacy.  Thank you for choosing [facility]!!

## 2016-01-22 NOTE — Progress Notes (Signed)
Cardiology Office Note    Date:  01/22/2016   ID:  Seth Young, DOB [birth year], MRN [id number]  PCP:  Geoffery Lyons, MD  Cardiologist:   Candee Furbish, MD     History of Present Illness:  Seth Young is a 75 y.o. male former patient of Dr. Sherryl Barters with diabetes, hypertension, hyperlipidemia, coronary artery disease here for follow-up  In 2015 he underwent heart catheterization after an abnormal nuclear stress test. Fairly diffuse disease. Normal ejection fraction 55%. Culprit vessel was felt to be the obtuse marginal 1. This was treated with PCI and DES. See some catheterization report summarized as below  Fairly rare episodes of chest pain. Previously had peripheral neuropathy no longer on Lyrica.  Swimming, exercise bicycle. Vertigo.  Felt mild SSCP 4am. GERD like. Diet makes worse.   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   Surprised by CAD. Does not have much confidence in nuclear stress test. We discussed.    Past Medical History  Diagnosis Date  . Hypertension   . Colon polyps 10-12-11    past hx.  . Exertional angina ([place]) 06/26/2014    Cath with DES to the OM, Bioflow protocol   . High cholesterol   . Pneumonia 1940's X 2  . Type II diabetes mellitus ([place]) dx'd 1982    nsulin started ~ 2000  . H/O hiatal hernia     no problems  . GERD (gastroesophageal reflux disease)   . Arthritis     "knees, toes, hands" (06/26/2014)  . Pseudogout     "it moves around"  . Ischemic chest pain ([place]) 08/07/2014  . Sleep apnea     no cpap used  . Basal cell carcinoma     left temporal area-no residual  . Peripheral neuropathy ([place])   . Occasional tremors     Bilateral hands  . Vertigo   . Ataxia     Past Surgical History  Procedure Laterality Date  . Back surgery    . Nasal septum surgery  1979  . Carpal tunnel release Left 10/2009  . Knee arthroscopy Left 05/2011    10'12-left knee  . Shoulder open rotator cuff  repair  10/14/2011    Procedure: ROTATOR CUFF REPAIR SHOULDER OPEN;  Surgeon: Johnn Hai, MD;  Location: [facility];  Service: Orthopedics;  Laterality: Right;  . Subacromial decompression  10/14/2011    Procedure: SUBACROMIAL DECOMPRESSION;  Surgeon: Johnn Hai, MD;  Location: [facility];  Service: Orthopedics;  Laterality: Right;  . Cardiac catheterization  02/2005    Dr. Mare Ferrari; negative.  . Coronary angioplasty with stent placement  06/26/2014    "1"  . Posterior lumbar fusion  1964  . Carpal tunnel release Right ~ 2013  . Cataract extraction w/ intraocular lens implant Left 2000's  . Cataract extraction w/ intraocular lens implant Right 2015  . Elbow surgery Bilateral ~ 2014    "for blockages; Dr. Amedeo Plenty"  . Trigger finger release Right ~ 2013-2014    "3rd & 4th digits"  . Basal cell carcinoma excision Left ~ 2012    face  . Coronary angioplasty with stent placement  06/26/2014    pLAD 50%, d LAD 60%, oD1 80%,  mD1  70%, CFX 50%, OM 2 70%,  RCA 80%, PDA 95% (<54mm), OM1 99%-0% with Bio flow stent       . Left heart catheterization with coronary angiogram N/A 06/26/2014    Procedure: LEFT HEART  CATHETERIZATION WITH CORONARY ANGIOGRAM;  Surgeon: Blane Ohara, MD;  Location: [facility] CATH LAB;  Service: Cardiovascular;  Laterality: N/A;  . Left heart catheterization with coronary angiogram N/A 08/07/2014    Procedure: LEFT HEART CATHETERIZATION WITH CORONARY ANGIOGRAM;  Surgeon: Blane Ohara, MD;  Location[facility] CATH LAB;  Service: Cardiovascular;  Laterality: N/A;  . Colonoscopy w/ polypectomy    . Endarterectomy Left 04/23/2015    Procedure: ENDARTERECTOMY CAROTID;  Surgeon: Conrad [place], MD;  Location: [place];  Service: Vascular;  Laterality: Left;  . Patch angioplasty Left 04/23/2015    Procedure: PATCH ANGIOPLASTY USING 1CM X 6CM XENOSURE BIOLOGIC PATCH;  Surgeon: Conrad [place], MD;  Location: [place];  Service: Vascular;  Laterality: Left;  . Carotid endarterectomy Left Ssept. 14, 2016      CEA    Current Medications: Outpatient Prescriptions Prior to Visit  Medication Sig Dispense Refill  . acetaminophen (TYLENOL) 325 MG tablet Take 650 mg by mouth at bedtime as needed (sleep).    Marland Kitchen aspirin 81 MG tablet Take 1 tablet (81 mg total) by mouth daily.    Marland Kitchen atorvastatin (LIPITOR) 40 MG tablet TAKE ONE TABLET BY MOUTH ONCE DAILY BEFORE  BREAKFAST 30 tablet 10  . canagliflozin (INVOKANA) 100 MG TABS tablet Take 100 mg by mouth daily.    . colchicine 0.6 MG tablet Take 0.6 mg by mouth as needed (gout).    Marland Kitchen diphenhydrAMINE (BENADRYL) 25 MG tablet Take 25 mg by mouth at bedtime as needed for sleep.    . dorzolamide-timolol (COSOPT) 22.3-6.8 MG/ML ophthalmic solution Place 1 drop into both eyes 2 (two) times daily.    . insulin glargine (LANTUS) 100 UNIT/ML injection Inject 68 Units into the skin at bedtime.     . insulin lispro (HUMALOG) 100 UNIT/ML injection Inject 0-45 Units into the skin daily as needed for high blood sugar (sliding scale). Sliding scale      . isosorbide mononitrate (IMDUR) 30 MG 24 hr tablet Take 1 tablet (30 mg total) by mouth daily. 30 tablet 8  . lisinopril (PRINIVIL,ZESTRIL) 10 MG tablet TAKE ONE TABLET BY MOUTH ONCE DAILY BEFORE BREAKFAST 90 tablet 2  . meclizine (ANTIVERT) 25 MG tablet Take 25 mg by mouth 3 (three) times daily as needed for dizziness.    . metFORMIN (GLUCOPHAGE) 1000 MG tablet TAKE ONE TABLET BY MOUTH TWICE DAILY WITH A MEAL 60 tablet 0  . nitroGLYCERIN (NITROSTAT) 0.4 MG SL tablet Place 1 tablet (0.4 mg total) under the tongue every 5 (five) minutes as needed for chest pain. 25 tablet 3  . Omega-3 Fatty Acids (FISH OIL PO) Take 1 capsule by mouth daily.    Marland Kitchen omeprazole (PRILOSEC) 40 MG capsule Take 40 mg by mouth 2 (two) times daily.    . Probiotic Product (PROBIOTIC PO) Take 1 tablet by mouth daily.    . metoprolol tartrate (LOPRESSOR) 25 MG tablet Take 1 tablet (25 mg total) by mouth 2 (two) times daily. 60 tablet 8  . prasugrel  (EFFIENT) 10 MG TABS tablet Take 10 mg by mouth daily.    . cefdinir (OMNICEF) 300 MG capsule   0  . Multiple Vitamins-Minerals (ZINC PO) Take 1 tablet by mouth daily.     No facility-administered medications prior to visit.     Allergies:   Gabapentin; Morphine and related; and Naproxen   Social History   Social History  . Marital Status: Married    Spouse Name: N/A  . Number of  Children: 5  . Years of Education: [employer]   Social History Main Topics  . Smoking status: Former Smoker -- 0.50 packs/day for 20 years    Types: Cigarettes    Quit date: 10/11/1988  . Smokeless tobacco: Never Used  . Alcohol Use: No     Comment: Quit drinking in 1990  . Drug Use: No  . Sexual Activity: No   Other Topics Concern  . None   Social History Narrative   Drinks about 2 cups of coffee a day, occasional diet pepsi.     Quit smoking approximately 27 years ago.  Family History:  The patient's family history includes Cancer in his sister; Diabetes in his brother, mother, and sister; Heart attack (age of onset: 38) in his father; Hypertension in his brother and sister; Varicose Veins in his mother and sister.   ROS:   Please see the history of present illness.    ROS All other systems reviewed and are negative.   PHYSICAL EXAM:   VS:  BP 118/62 mmHg  Pulse 70  Ht 5' 11.5" (1.816 m)  Wt 247 lb 3.2 oz (112.129 kg)  BMI 34.00 kg/m2   GEN: Well nourished, well developed, in no acute distress HEENT: normal Neck: no JVD, carotid bruits, or masses Cardiac: RRR; no murmurs, rubs, or gallops,no edema  Respiratory:  clear to auscultation bilaterally, normal work of breathing GI: soft, nontender, nondistended, + BS MS: no deformity or atrophy Skin: warm and dry, no rash Neuro:  Alert and Oriented x 3, Strength and sensation are intact Psych: euthymic mood, full affect  Wt Readings from Last 3 Encounters:  01/22/16 247 lb 3.2 oz (112.129 kg)  10/30/15 241 lb (109.317 kg)  09/17/15 241  lb 6.4 oz (109.498 kg)      Studies/Labs Reviewed:   EKG:  None today  Recent Labs: 04/18/2015: ALT 27 07/21/2015: BUN 13; Creatinine, Ser 1.16; Hemoglobin 14.6; Platelets 182; Potassium 4.3; Sodium 136   Lipid Panel No results found for: CHOL, TRIG, HDL, CHOLHDL, VLDL, LDLCALC, LDLDIRECT  Additional studies/ records that were reviewed today include:  ECHO  (07/21/15) - Left ventricle: The cavity size was normal. There was severe focal basal and mild concentric hypertrophy of the left ventricle. Systolic function was vigorous. The estimated ejection fraction was in the range of 65% to 70%. Wall motion was normal; there were no regional wall motion abnormalities. Doppler parameters are consistent with abnormal left ventricular relaxation (grade 1 diastolic dysfunction). Doppler parameters are consistent with elevated ventricular end-diastolic filling pressure.    ASSESSMENT:    1. Coronary artery disease due to lipid rich plaque   2. Hypercholesterolemia   3. Essential hypertension   4. S/P carotid endarterectomy   5. Amaurosis fugax   6. IDDM (insulin dependent diabetes mellitus) (HCC)      PLAN:  In order of problems listed above:  Coronary artery disease  - Diffuse coronary disease, OM1 vessel DES. No recent anginal symptoms. Had been on dual antiplatelet therapy for a minimum of 12 months.We will stop Effient, start Plavix. Contemplated stopping dual antiplatelet therapy however given his peripheral vascular disease/carotid artery disease we will transition to Plavix. I'm aware as well that he is on omeprazole. He had very poor targets for bypass grafting but could be considered for surgical referral if he develops proximal LAD stenosis which is currently at 50%.  Essential hypertension  - Isosorbide, lisinopril, metoprolol. Controlled  Carotid artery disease  - Endarterectomy 04/23/15, left with no TIA  symptoms. Aggressive secondary  prevention  Hyperlipidemia  - Lipitor. PCP has been following this.  Diabetes with insulin use  - Hemoglobin once he 7.0. Medicines reviewed.  Obesity  - Continue to encourage weight loss. This will also help his diabetes.  Vertigo  - bad past 3 months. After shower better. PT. Walking tough.   Medication Adjustments/Labs and Tests Ordered: Current medicines are reviewed at length with the patient today.  Concerns regarding medicines are outlined above.  Medication changes, Labs and Tests ordered today are listed in the Patient Instructions below. Patient Instructions  Medication Instructions:  Please stop Effient and start Plavix 75 mg a day. Continue all other medications as listed.  Follow-Up: Follow up in 6 months with Dr. Marlou Porch.  You will receive a letter in the mail 2 months before you are due.  Please call us when you receive this letter to schedule your follow up appointment.  If you need a refill on your cardiac medications before your next appointment, please call your pharmacy.  Thank you for choosing [facility]!!          Signed, Candee Furbish, MD  01/22/2016 9:07 AM    [facility] [place], [place], [place]  [address] Phone: [phone]; Fax: [phone]

## 2016-01-27 DIAGNOSIS — R42 Dizziness and giddiness: Secondary | ICD-10-CM | POA: Diagnosis not present

## 2016-01-30 ENCOUNTER — Encounter: Payer: Self-pay | Admitting: Vascular Surgery

## 2016-02-03 ENCOUNTER — Ambulatory Visit: Payer: Medicare Other | Admitting: Neurology

## 2016-02-05 NOTE — Progress Notes (Signed)
Established Carotid Patient  History of Present Illness  Seth Young is a 75 y.o. (Jun 04, 1941) male who presents with chief complaint: routine surveillance.  Pt is s/p L CEA (04/23/15) for L sx ICA stenosis >90%.  Previous carotid studies demonstrated: RICA [id number] stenosis, LICA [id number] stenosis.  Patient has history of stroke symptoms.  The patient has had visual field loss felt related to carotid disease.  The patient has nevern had facial drooping or hemiplegia.  The patient has never had receptive or expressive aphasia.  The patient's previous neurologic deficits have mostly  Resolved.  The patient's PMH, PSH, SH, and FamHx are unchanged from 07/18/15.  Current Outpatient Prescriptions  Medication Sig Dispense Refill  . acetaminophen (TYLENOL) 325 MG tablet Take 650 mg by mouth at bedtime as needed (sleep).    Marland Kitchen aspirin 81 MG tablet Take 1 tablet (81 mg total) by mouth daily.    Marland Kitchen atorvastatin (LIPITOR) 40 MG tablet TAKE ONE TABLET BY MOUTH ONCE DAILY BEFORE  BREAKFAST 30 tablet 10  . canagliflozin (INVOKANA) 100 MG TABS tablet Take 100 mg by mouth daily.    . clopidogrel (PLAVIX) 75 MG tablet Take 1 tablet (75 mg total) by mouth daily. 90 tablet 3  . colchicine 0.6 MG tablet Take 0.6 mg by mouth as needed (gout).    Marland Kitchen diphenhydrAMINE (BENADRYL) 25 MG tablet Take 25 mg by mouth at bedtime as needed for sleep.    . dorzolamide-timolol (COSOPT) 22.3-6.8 MG/ML ophthalmic solution Place 1 drop into both eyes 2 (two) times daily.    . insulin glargine (LANTUS) 100 UNIT/ML injection Inject 68 Units into the skin at bedtime.     . insulin lispro (HUMALOG) 100 UNIT/ML injection Inject 0-45 Units into the skin daily as needed for high blood sugar (sliding scale). Sliding scale      . isosorbide mononitrate (IMDUR) 30 MG 24 hr tablet Take 1 tablet (30 mg total) by mouth daily. 30 tablet 8  . lisinopril (PRINIVIL,ZESTRIL) 10 MG tablet TAKE ONE TABLET BY MOUTH ONCE DAILY BEFORE BREAKFAST 90 tablet 2    . LYRICA 100 MG capsule Take 1 capsule by mouth daily as needed. For pain  2  . meclizine (ANTIVERT) 25 MG tablet Take 25 mg by mouth 3 (three) times daily as needed for dizziness.    . metFORMIN (GLUCOPHAGE) 1000 MG tablet TAKE ONE TABLET BY MOUTH TWICE DAILY WITH A MEAL 60 tablet 0  . metoprolol tartrate (LOPRESSOR) 25 MG tablet Take 1 tablet (25 mg total) by mouth 2 (two) times daily. 180 tablet 3  . nitroGLYCERIN (NITROSTAT) 0.4 MG SL tablet Place 1 tablet (0.4 mg total) under the tongue every 5 (five) minutes as needed for chest pain. 25 tablet 3  . Omega-3 Fatty Acids (FISH OIL PO) Take 1 capsule by mouth daily.    Marland Kitchen omeprazole (PRILOSEC) 40 MG capsule Take 40 mg by mouth 2 (two) times daily.    . Probiotic Product (PROBIOTIC PO) Take 1 tablet by mouth daily.     No current facility-administered medications for this visit.    Allergies  Allergen Reactions  . Gabapentin     Ataxia  . Morphine And Related Itching    Itching   . Naproxen Other (See Comments)    Tremors, hallucinates     On ROS today: L lower leg neuropathic sx, +vertigo   Physical Examination  Filed Vitals:   02/06/16 1014 02/06/16 1017  BP: 114/66 118/60  Pulse: 74 77  Temp: 97.6 F (36.4 C)   TempSrc: Oral   Resp: 16   Height: 5' 11.5" (1.816 m)   Weight: 248 lb (112.492 kg)   SpO2: 98%    Body mass index is 34.11 kg/(m^2).  General: A&O x 3, WDWN  Eyes: PERRLA, EOMI  Neck: Supple, no nuchal rigidity, no palpable LAD  Pulmonary: Sym exp, good air movt, CTAB, no rales, rhonchi, & wheezing  Cardiac: RRR, Nl S1, S2, no Murmurs, rubs or gallops  Vascular: Vessel Right Left  Radial Palpable Palpable  Brachial Palpable Palpable  Carotid Palpable, without bruit Palpable, without bruit  Aorta Not palpable N/A  Femoral Palpable Palpable  Popliteal Not palpable Not palpable  PT Palpable Palpable  DP Palpable Palpable   Gastrointestinal: soft, NTND, no G/R, no HSM, no masses, no CVAT  B  Musculoskeletal: M/S 5/5 throughout , Extremities without ischemic changes   Neurologic: CN 2-12 intact , Pain and light touch intact in extremities , Motor exam as listed above   Non-Invasive Vascular Imaging  CAROTID DUPLEX (Date: 02/06/16):   R ICA stenosis: 1-39%  R VA: patent and antegrade  L ICA stenosis: widely patent CEA  L VA: patent and antegrade   Medical Decision Making  Seth Young is a 75 y.o. male who presents with: s/p L CEA for sx ICA stenosis >90%., asx R ICA stenosis <50%   Based on the patient's vascular studies and examination, I have offered the patient: annual B carotid duplex.  I discussed in depth with the patient the nature of atherosclerosis, and emphasized the importance of maximal medical management including strict control of blood pressure, blood glucose, and lipid levels, antiplatelet agents, obtaining regular exercise, and cessation of smoking.    The patient is aware that without maximal medical management the underlying atherosclerotic disease process will progress, limiting the benefit of any interventions. The patient is currently on a statin: Lipitor. The patient is currently on an anti-platelet: ASA.  Thank you for allowing [place] to participate in this patient's care.   Adele Barthel, MD, FACS [facility] Office: [phone] Pager: [phone]  VASCULAR QUALITY INITIATIVE FOLLOW UP DATA:  Current smoker: [  ] yes  [x  ] no  Living status: [  x]  Home  [  ] Nursing home  [  ] Homeless    MEDS:  ASA [  x] yes  [  ] no- [  ] medical reason  [  ] non compliant  STATIN  [x  ] yes  [  ] no- [  ] medical reason  [  ] non compliant  Beta blocker [  ] yes  [ x ] no- [  ] medical reason  [  ] non compliant  ACE inhibitor [x  ] yes  [  ] no- [  ] medical reason  [  ] non compliant  P2Y12 Antagonist [ x ] none  [  ] clopidogrel-Plavix  [  ] ticlopidine-Ticlid   [  ] prasugrel-Effient  [  ]  ticagrelor- Brilinta    Anticoagulant [x  ] None  [  ] warfarin  [  ] rivaroxaban-Xarelto [  ] dabigatran- Pradaxa  Neurologic event since D/C:  [ x ] no  [  ] yes: [  ] eye event  [  ] cortical event  [  ] VB event  [  ] non specific event  [  ] right  [  ] left  [  ]  TIA  [  ] stroke  Date:   Modified Rankin Score: 0  MI since D/C: [ x ] no  [  ] troponin only  [  ] EKG or clinical  Cranial nerve injury: [x  ] none  [  ] resolved  [  ] persistent  Duplex CEA site: [  ] no  [x  ] yes - PSV= 56  EDV= 14  ICA/CCA ratio: <1.0  Stenosis= [ x ] <40% [  ] 40-59% [  ] 60-79%  [  ] > 80%  [  ]  Occluded  CEA site re-operation:  [x  ] no   [  ] yes- date of re-op:  CEA site PCI:   [ x ] no   [  ] yes- date of PCI:

## 2016-02-06 ENCOUNTER — Encounter: Payer: Self-pay | Admitting: Vascular Surgery

## 2016-02-06 ENCOUNTER — Ambulatory Visit (INDEPENDENT_AMBULATORY_CARE_PROVIDER_SITE_OTHER): Payer: Medicare Other | Admitting: Vascular Surgery

## 2016-02-06 ENCOUNTER — Ambulatory Visit (HOSPITAL_COMMUNITY)
Admission: RE | Admit: 2016-02-06 | Discharge: 2016-02-06 | Disposition: A | Discharge status: Home / Self Care | Payer: Medicare Other | Source: Ambulatory Visit | Attending: Vascular Surgery | Admitting: Vascular Surgery

## 2016-02-06 VITALS — BP 118/60 | HR 77 | Temp 97.6°F | Resp 16 | Ht 71.5 in | Wt 248.0 lb

## 2016-02-06 DIAGNOSIS — I6522 Occlusion and stenosis of left carotid artery: Secondary | ICD-10-CM | POA: Diagnosis not present

## 2016-02-06 DIAGNOSIS — I739 Peripheral vascular disease, unspecified: Principal | ICD-10-CM

## 2016-02-06 DIAGNOSIS — G4730 Sleep apnea, unspecified: Secondary | ICD-10-CM | POA: Diagnosis not present

## 2016-02-06 DIAGNOSIS — E7800 Pure hypercholesterolemia, unspecified: Secondary | ICD-10-CM | POA: Diagnosis not present

## 2016-02-06 DIAGNOSIS — I10 Essential (primary) hypertension: Secondary | ICD-10-CM | POA: Diagnosis not present

## 2016-02-06 DIAGNOSIS — K219 Gastro-esophageal reflux disease without esophagitis: Secondary | ICD-10-CM | POA: Insufficient documentation

## 2016-02-06 DIAGNOSIS — I779 Disorder of arteries and arterioles, unspecified: Secondary | ICD-10-CM | POA: Diagnosis not present

## 2016-02-06 DIAGNOSIS — E119 Type 2 diabetes mellitus without complications: Secondary | ICD-10-CM | POA: Diagnosis not present

## 2016-02-06 LAB — VAS US CAROTID
LCCADSYS: 97 cm/s
LICADDIAS: -13 cm/s
LICADSYS: -53 cm/s
LICAPSYS: -56 cm/s
Left CCA prox sys: 86 cm/s
Left ICA prox dias: -14 cm/s
RCCAPSYS: -89 cm/s
Right cca dist sys: -66 cm/s

## 2016-02-17 DIAGNOSIS — R42 Dizziness and giddiness: Secondary | ICD-10-CM | POA: Diagnosis not present

## 2016-02-24 DIAGNOSIS — I10 Essential (primary) hypertension: Secondary | ICD-10-CM | POA: Diagnosis not present

## 2016-02-24 DIAGNOSIS — Z6834 Body mass index (BMI) 34.0-34.9, adult: Secondary | ICD-10-CM | POA: Diagnosis not present

## 2016-02-24 DIAGNOSIS — E118 Type 2 diabetes mellitus with unspecified complications: Secondary | ICD-10-CM | POA: Diagnosis not present

## 2016-02-24 DIAGNOSIS — E669 Obesity, unspecified: Secondary | ICD-10-CM | POA: Diagnosis not present

## 2016-03-09 DIAGNOSIS — E118 Type 2 diabetes mellitus with unspecified complications: Secondary | ICD-10-CM | POA: Diagnosis not present

## 2016-03-29 DIAGNOSIS — Z6834 Body mass index (BMI) 34.0-34.9, adult: Secondary | ICD-10-CM | POA: Diagnosis not present

## 2016-03-29 DIAGNOSIS — K219 Gastro-esophageal reflux disease without esophagitis: Secondary | ICD-10-CM | POA: Diagnosis not present

## 2016-03-29 DIAGNOSIS — E118 Type 2 diabetes mellitus with unspecified complications: Secondary | ICD-10-CM | POA: Diagnosis not present

## 2016-03-29 DIAGNOSIS — I2510 Atherosclerotic heart disease of native coronary artery without angina pectoris: Secondary | ICD-10-CM | POA: Diagnosis not present

## 2016-03-29 DIAGNOSIS — R0789 Other chest pain: Secondary | ICD-10-CM | POA: Diagnosis not present

## 2016-04-06 DIAGNOSIS — Z6834 Body mass index (BMI) 34.0-34.9, adult: Secondary | ICD-10-CM | POA: Diagnosis not present

## 2016-04-06 DIAGNOSIS — E118 Type 2 diabetes mellitus with unspecified complications: Secondary | ICD-10-CM | POA: Diagnosis not present

## 2016-04-06 DIAGNOSIS — I10 Essential (primary) hypertension: Secondary | ICD-10-CM | POA: Diagnosis not present

## 2016-04-06 DIAGNOSIS — K219 Gastro-esophageal reflux disease without esophagitis: Secondary | ICD-10-CM | POA: Diagnosis not present

## 2016-04-06 DIAGNOSIS — Z23 Encounter for immunization: Secondary | ICD-10-CM | POA: Diagnosis not present

## 2016-04-07 ENCOUNTER — Other Ambulatory Visit: Payer: Self-pay | Admitting: Cardiology

## 2016-04-07 MED ORDER — LISINOPRIL 10 MG PO TABS: ORAL_TABLET | ORAL | 2 refills | Status: DC

## 2016-04-28 ENCOUNTER — Telehealth: Payer: Self-pay | Admitting: Cardiology

## 2016-04-28 NOTE — Telephone Encounter (Signed)
New Message:    Pt having chest discomfort,it feels like gas. Pt is not sure whether it is gas or his heart. They have been trying to adjust his medicine,wonder if he should be concerned,

## 2016-04-28 NOTE — Telephone Encounter (Signed)
Calling stating that he has been having a lot of what he describes as "gas" pains over the past couple of months.  Dr. Jacquiline Doe office has been trying to adjust his diabetic medications.  He is now taking Jardiance 25 mg daily.  He has been seeing their pharm D-Dr. Sabra Heck.  States he has been having a lot of chest discomfort during night and has to sit up.  He has tried taking NTG but that hasn't helped.  Dr. Jacquiline Doe nurse has suggested that he not eat after 7 PM and to sit up at night.  Dr. [name] did EKG about 4 wks ago that was nml.  BP has been good.  He states pain does not radiate to neck or arm. Pain in center of chest but usually goes away after few minutes. He states he has been watching what he eats and realizes some foods cause him to have "excessive gas". He is taking Omeprazole 40 mg BID also.  Advised that not any of his heart medications would be causing this discomfort.  He wants to get back in touch with Dr. Sabra Heck to see if Vania Rea could be causing the "gas".  Advised if she felt like we needed to see him then he will call us back. Offered him an appointment but he wants to wait and see what Dr. Jacquiline Doe office suggests.  His last cath was in 2015 by Dr. Burt Knack.

## 2016-05-04 ENCOUNTER — Encounter: Payer: Self-pay | Admitting: Internal Medicine

## 2016-05-05 DIAGNOSIS — H34232 Retinal artery branch occlusion, left eye: Secondary | ICD-10-CM | POA: Diagnosis not present

## 2016-05-05 DIAGNOSIS — H3582 Retinal ischemia: Secondary | ICD-10-CM | POA: Diagnosis not present

## 2016-05-05 DIAGNOSIS — E113593 Type 2 diabetes mellitus with proliferative diabetic retinopathy without macular edema, bilateral: Secondary | ICD-10-CM | POA: Diagnosis not present

## 2016-05-05 DIAGNOSIS — H348322 Tributary (branch) retinal vein occlusion, left eye, stable: Secondary | ICD-10-CM | POA: Diagnosis not present

## 2016-05-07 ENCOUNTER — Encounter: Payer: Self-pay | Admitting: Cardiology

## 2016-05-07 ENCOUNTER — Ambulatory Visit (INDEPENDENT_AMBULATORY_CARE_PROVIDER_SITE_OTHER): Payer: Medicare Other | Admitting: Cardiology

## 2016-05-07 ENCOUNTER — Telehealth: Payer: Self-pay | Admitting: *Deleted

## 2016-05-07 ENCOUNTER — Encounter: Payer: Self-pay | Admitting: *Deleted

## 2016-05-07 VITALS — BP 136/70 | HR 70 | Ht 71.5 in | Wt 251.2 lb

## 2016-05-07 DIAGNOSIS — Z01818 Encounter for other preprocedural examination: Secondary | ICD-10-CM | POA: Diagnosis not present

## 2016-05-07 DIAGNOSIS — E7800 Pure hypercholesterolemia, unspecified: Secondary | ICD-10-CM

## 2016-05-07 DIAGNOSIS — I209 Angina pectoris, unspecified: Secondary | ICD-10-CM

## 2016-05-07 DIAGNOSIS — I6522 Occlusion and stenosis of left carotid artery: Secondary | ICD-10-CM

## 2016-05-07 DIAGNOSIS — I2583 Coronary atherosclerosis due to lipid rich plaque: Principal | ICD-10-CM

## 2016-05-07 DIAGNOSIS — I2510 Atherosclerotic heart disease of native coronary artery without angina pectoris: Secondary | ICD-10-CM | POA: Diagnosis not present

## 2016-05-07 LAB — CBC
HCT: 44.2 % (ref 38.5–50.0)
HEMOGLOBIN: 13.8 g/dL (ref 13.2–17.1)
MCH: 23.2 pg — ABNORMAL LOW (ref 27.0–33.0)
MCHC: 31.2 g/dL — AB (ref 32.0–36.0)
MCV: 74.2 fL — ABNORMAL LOW (ref 80.0–100.0)
Platelets: 204 10*3/uL (ref 140–400)
RBC: 5.96 MIL/uL — ABNORMAL HIGH (ref 4.20–5.80)
RDW: 18.5 % — AB (ref 11.0–15.0)
WBC: 6.5 10*3/uL (ref 3.8–10.8)

## 2016-05-07 LAB — BASIC METABOLIC PANEL
BUN: 17 mg/dL (ref 7–25)
CHLORIDE: 99 mmol/L (ref 98–110)
CO2: 23 mmol/L (ref 20–31)
Calcium: 9.5 mg/dL (ref 8.6–10.3)
Creat: 1.17 mg/dL (ref 0.70–1.18)
Glucose, Bld: 127 mg/dL — ABNORMAL HIGH (ref 65–99)
POTASSIUM: 4.7 mmol/L (ref 3.5–5.3)
SODIUM: 136 mmol/L (ref 135–146)

## 2016-05-07 NOTE — Telephone Encounter (Signed)
Was unable to place a comment or addend letter containing pt's cardiac cath instructions.  Jardiance was left off on instruction letter as a med to hold that AM.  Reviewed with pt prior to him leaving the office today that he does need to hold that med the morning of his cath.  He stated understanding.

## 2016-05-07 NOTE — Progress Notes (Signed)
Cardiology Office Note    Date:  05/07/2016   ID:  Seth Young, DOB June 06, 1941, MRN [id number]  PCP:  Geoffery Lyons, MD  Cardiologist:   Candee Furbish, MD     History of Present Illness:  Seth Young is a 75 y.o. male former patient of Dr. Sherryl Barters with diabetes, hypertension, hyperlipidemia, coronary artery disease here for follow-up  In 2015 he underwent heart catheterization after an abnormal nuclear stress test. Fairly diffuse disease. Normal ejection fraction 55%. Culprit vessel was felt to be the obtuse marginal 1. This was treated with PCI and DES. See some catheterization report summarized as below  Vertigo all summer 2017 - no CVA. Still with it. Cut back on exercise. Swim a little.   Felt a pressure in chest similar to GERD prior to stent. Dr. [name] ECG.  Watch diet. Pain certainly has GERD-like suggestion such as when laying down feels worse. He however is quite concerned especially with the increased shortness of breath with exercise. He is worried that his chest pressure may be worsening coronary disease.  Cardiac catheterization from 20152 reviewed. OM stent patent. PDA did not appear graftable. Fairly diffuse moderate CAD.   Swimming, exercise bicycle. Vertigo.    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   Surprised by CAD. Does not have much confidence in nuclear stress test. We discussed.      Past Medical History:  Diagnosis Date  . Arthritis    "knees, toes, hands" (06/26/2014)  . Ataxia   . Basal cell carcinoma    left temporal area-no residual  . Colon polyps 10-12-11   past hx.  . Exertional angina ([place]) 06/26/2014   Cath with DES to the OM, Bioflow protocol   . GERD (gastroesophageal reflux disease)   . H/O hiatal hernia    no problems  . High cholesterol   . Hypertension   . Ischemic chest pain ([place]) 08/07/2014  . Occasional tremors    Bilateral hands  . Peripheral neuropathy ([place])   .  Pneumonia 1940's X 2  . Pseudogout    "it moves around"  . Sleep apnea    no cpap used  . Type II diabetes mellitus ([place]) dx'd 1982   nsulin started ~ 2000  . Vertigo     Past Surgical History:  Procedure Laterality Date  . BACK SURGERY    . BASAL CELL CARCINOMA EXCISION Left ~ 2012   face  . CARDIAC CATHETERIZATION  02/2005   Dr. Mare Ferrari; negative.  Marland Kitchen CAROTID ENDARTERECTOMY Left Ssept. 14, 2016   CEA  . CARPAL TUNNEL RELEASE Left 10/2009  . CARPAL TUNNEL RELEASE Right ~ 2013  . CATARACT EXTRACTION W/ INTRAOCULAR LENS IMPLANT Left 2000's  . CATARACT EXTRACTION W/ INTRAOCULAR LENS IMPLANT Right 2015  . COLONOSCOPY W/ POLYPECTOMY    . CORONARY ANGIOPLASTY WITH STENT PLACEMENT  06/26/2014   "1"  . CORONARY ANGIOPLASTY WITH STENT PLACEMENT  06/26/2014   pLAD 50%, d LAD 60%, oD1 80%,  mD1  70%, CFX 50%, OM 2 70%,  RCA 80%, PDA 95% (<56mm), OM1 99%-0% with Bio flow stent       . ELBOW SURGERY Bilateral ~ 2014   "for blockages; Dr. Amedeo Plenty"  . ENDARTERECTOMY Left 04/23/2015   Procedure: ENDARTERECTOMY CAROTID;  Surgeon: Conrad [place], MD;  Location: [facility];  Service: Vascular;  Laterality: Left;  . KNEE ARTHROSCOPY Left 05/2011   10'12-left knee  . LEFT HEART CATHETERIZATION WITH  CORONARY ANGIOGRAM N/A 06/26/2014   Procedure: LEFT HEART CATHETERIZATION WITH CORONARY ANGIOGRAM;  Surgeon: Blane Ohara, MD;  Location: [facility] CATH LAB;  Service: Cardiovascular;  Laterality: N/A;  . LEFT HEART CATHETERIZATION WITH CORONARY ANGIOGRAM N/A 08/07/2014   Procedure: LEFT HEART CATHETERIZATION WITH CORONARY ANGIOGRAM;  Surgeon: Blane Ohara, MD;  Location: [facility] CATH LAB;  Service: Cardiovascular;  Laterality: N/A;  . NASAL SEPTUM SURGERY  1979  . PATCH ANGIOPLASTY Left 04/23/2015   Procedure: PATCH ANGIOPLASTY USING 1CM X 6CM XENOSURE BIOLOGIC PATCH;  Surgeon: Conrad Fraser, MD;  Location: [facility];  Service: Vascular;  Laterality: Left;  . [place]  . SHOULDER OPEN ROTATOR CUFF  REPAIR  10/14/2011   Procedure: ROTATOR CUFF REPAIR SHOULDER OPEN;  Surgeon: Johnn Hai, MD;  Location: [facility];  Service: Orthopedics;  Laterality: Right;  . SUBACROMIAL DECOMPRESSION  10/14/2011   Procedure: SUBACROMIAL DECOMPRESSION;  Surgeon: Johnn Hai, MD;  Location: [facility];  Service: Orthopedics;  Laterality: Right;  . TRIGGER FINGER RELEASE Right ~ 2013-2014   "3rd & 4th digits"    Current Medications: Outpatient Medications Prior to Visit  Medication Sig Dispense Refill  . acetaminophen (TYLENOL) 325 MG tablet Take 650 mg by mouth at bedtime as needed (sleep).    Marland Kitchen aspirin 81 MG tablet Take 1 tablet (81 mg total) by mouth daily.    Marland Kitchen atorvastatin (LIPITOR) 40 MG tablet TAKE ONE TABLET BY MOUTH ONCE DAILY BEFORE  BREAKFAST 30 tablet 10  . clopidogrel (PLAVIX) 75 MG tablet Take 1 tablet (75 mg total) by mouth daily. 90 tablet 3  . colchicine 0.6 MG tablet Take 0.6 mg by mouth as needed (gout).    Marland Kitchen diphenhydrAMINE (BENADRYL) 25 MG tablet Take 25 mg by mouth at bedtime as needed for sleep.    . dorzolamide-timolol (COSOPT) 22.3-6.8 MG/ML ophthalmic solution Place 1 drop into both eyes 2 (two) times daily.    . empagliflozin (JARDIANCE) 25 MG TABS tablet Take 25 mg by mouth daily.    . insulin glargine (LANTUS) 100 UNIT/ML injection Inject 68 Units into the skin at bedtime.     . insulin lispro (HUMALOG) 100 UNIT/ML injection Inject 0-45 Units into the skin daily as needed for high blood sugar (sliding scale). Sliding scale      . isosorbide mononitrate (IMDUR) 30 MG 24 hr tablet Take 1 tablet (30 mg total) by mouth daily. 30 tablet 8  . lisinopril (PRINIVIL,ZESTRIL) 10 MG tablet TAKE ONE TABLET BY MOUTH ONCE DAILY BEFORE BREAKFAST 90 tablet 2  . LYRICA 100 MG capsule Take 1 capsule by mouth daily as needed. For pain  2  . meclizine (ANTIVERT) 25 MG tablet Take 25 mg by mouth 3 (three) times daily as needed for dizziness.    . metFORMIN (GLUCOPHAGE) 1000 MG tablet TAKE ONE TABLET  BY MOUTH TWICE DAILY WITH A MEAL 60 tablet 0  . metoprolol tartrate (LOPRESSOR) 25 MG tablet Take 1 tablet (25 mg total) by mouth 2 (two) times daily. 180 tablet 3  . nitroGLYCERIN (NITROSTAT) 0.4 MG SL tablet Place 1 tablet (0.4 mg total) under the tongue every 5 (five) minutes as needed for chest pain. 25 tablet 3  . Omega-3 Fatty Acids (FISH OIL PO) Take 1 capsule by mouth daily.    Marland Kitchen omeprazole (PRILOSEC) 40 MG capsule Take 40 mg by mouth 2 (two) times daily.    . Probiotic Product (PROBIOTIC PO) Take 1 tablet by mouth daily.    Marland Kitchen  canagliflozin (INVOKANA) 100 MG TABS tablet Take 100 mg by mouth daily.     No facility-administered medications prior to visit.      Allergies:   Gabapentin; Morphine and related; and Naproxen   Social History   Social History  . Marital status: Married    Spouse name: N/A  . Number of children: 5  . Years of education: College   Social History Main Topics  . Smoking status: Former Smoker    Packs/day: 0.50    Years: 20.00    Types: Cigarettes    Quit date: 10/11/1988  . Smokeless tobacco: Never Used  . Alcohol use No     Comment: Quit drinking in 1990  . Drug use: No  . Sexual activity: No   Other Topics Concern  . None   Social History Narrative   Drinks about 2 cups of coffee a day, occasional diet pepsi.     Quit smoking approximately 27 years ago.  Family History:  The patient's family history includes Cancer in his sister; Diabetes in his brother, mother, and sister; Heart attack (age of onset: 72) in his father; Hypertension in his brother and sister; Varicose Veins in his mother and sister.   ROS:   Please see the history of present illness.    ROS All other systems reviewed and are negative.   PHYSICAL EXAM:   VS:  BP 136/70   Pulse 70   Ht 5' 11.5" (1.816 m)   Wt 251 lb 3.2 oz (113.9 kg)   BMI 34.55 kg/m    GEN: Well nourished, well developed, in no acute distress  HEENT: normal  Neck: no JVD, carotid bruits, or  masses Cardiac: RRR; no murmurs, rubs, or gallops,no edema  Respiratory:  clear to auscultation bilaterally, normal work of breathing GI: soft, nontender, nondistended, + BS MS: no deformity or atrophy  Skin: warm and dry, no rash Neuro:  Alert and Oriented x 3, Strength and sensation are intact Psych: euthymic mood, full affect  Wt Readings from Last 3 Encounters:  05/07/16 251 lb 3.2 oz (113.9 kg)  02/06/16 248 lb (112.5 kg)  01/22/16 247 lb 3.2 oz (112.1 kg)      Studies/Labs Reviewed:   EKG:  None today  Recent Labs: 07/21/2015: BUN 13; Creatinine, Ser 1.16; Hemoglobin 14.6; Platelets 182; Potassium 4.3; Sodium 136   Lipid Panel No results found for: CHOL, TRIG, HDL, CHOLHDL, VLDL, LDLCALC, LDLDIRECT  Additional studies/ records that were reviewed today include:  ECHO  (07/21/15) - Left ventricle: The cavity size was normal. There was severe focal basal and mild concentric hypertrophy of the left ventricle. Systolic function was vigorous. The estimated ejection fraction was in the range of 65% to 70%. Wall motion was normal; there were no regional wall motion abnormalities. Doppler parameters are consistent with abnormal left ventricular relaxation (grade 1 diastolic dysfunction). Doppler parameters are consistent with elevated ventricular end-diastolic filling pressure.    ASSESSMENT:    1. Coronary artery disease due to lipid rich plaque   2. Angina pectoris ([place])   3. Hypercholesterolemia   4. Carotid stenosis, left      PLAN:  In order of problems listed above:  Coronary artery disease/Angina  - He is concerned that his symptoms are starting to progress. More shortness of breath. More chest pressure. It is hard for him to figure out whether this is GERD or coronary disease. He has seen gastroenterology, Dr. Watt Climes in the past. Given the diffuse nature of his  CAD back in 2015, diabetes, worsening symptoms, I wish to proceed with cardiac  catheterization to exclude the possibility of significant triple-vessel disease progression that may require revascularization with bypass. Risks and benefits including stroke, heart attack, death, renal impairment, bleeding discussed, he is willing to proceed.  - Diffuse coronary disease, OM1 vessel DES. No recent anginal symptoms. Had been on dual antiplatelet therapy for a minimum of 12 months. Plavix. Contemplated stopping dual antiplatelet therapy however given his peripheral vascular disease/carotid artery disease we will transition to Plavix. I'm aware as well that he is on omeprazole. He had very poor targets for bypass grafting but could be considered for surgical referral if he develops proximal LAD stenosis which is currently at 50%.  Essential hypertension  - Isosorbide, lisinopril, metoprolol. Controlled  Carotid artery disease  - Endarterectomy 04/23/15, left with no TIA symptoms. Dr. Bridgett Larsson. Aggressive secondary prevention  Hyperlipidemia  - Lipitor. PCP has been following this.  Diabetes with insulin use  - Hemoglobin once he 7.0. Medicines reviewed.  Obesity  - Continue to encourage weight loss. This will also help his diabetes.  Vertigo  - Still having bad symptoms. After shower better. PT. Walking tough.   Medication Adjustments/Labs and Tests Ordered: Current medicines are reviewed at length with the patient today.  Concerns regarding medicines are outlined above.  Medication changes, Labs and Tests ordered today are listed in the Patient Instructions below. There are no Patient Instructions on file for this visit.   Signed, Candee Furbish, MD  05/07/2016 3:20 PM    [facility] [place], [place], [place]  [address] Phone: [phone]; Fax: [phone]

## 2016-05-07 NOTE — Patient Instructions (Signed)
Medication Instructions:  The current medical regimen is effective;  continue present plan and medications.  Labwork: Please have blood work today. (BMP, CBC and PT/INR)  Testing/Procedures: Your physician has requested that you have a cardiac catheterization. Cardiac catheterization is used to diagnose and/or treat various heart conditions. Doctors may recommend this procedure for a number of different reasons. The most common reason is to evaluate chest pain. Chest pain can be a symptom of coronary artery disease (CAD), and cardiac catheterization can show whether plaque is narrowing or blocking your heart's arteries. This procedure is also used to evaluate the valves, as well as measure the blood flow and oxygen levels in different parts of your heart. For further information please visit [email or web address]. Please follow instruction sheet, as given.  Follow-Up: Follow up after cath as directed.  If you need a refill on your cardiac medications before your next appointment, please call your pharmacy.  Thank you for choosing [facility]!!

## 2016-05-08 LAB — PROTIME-INR
INR: 1.1
PROTHROMBIN TIME: 11.6 s — AB (ref 9.0–11.5)

## 2016-05-13 ENCOUNTER — Encounter (HOSPITAL_COMMUNITY): Payer: Self-pay | Admitting: *Deleted

## 2016-05-13 ENCOUNTER — Ambulatory Visit (HOSPITAL_COMMUNITY)
Admission: RE | Admit: 2016-05-13 | Discharge: 2016-05-13 | Disposition: A | Discharge status: Home / Self Care | Payer: Medicare Other | Source: Ambulatory Visit | Attending: Cardiology | Admitting: Cardiology

## 2016-05-13 ENCOUNTER — Encounter (HOSPITAL_COMMUNITY)
Admission: RE | Disposition: A | Discharge status: Home / Self Care | Payer: Self-pay | Source: Ambulatory Visit | Attending: Cardiology

## 2016-05-13 DIAGNOSIS — I2510 Atherosclerotic heart disease of native coronary artery without angina pectoris: Secondary | ICD-10-CM | POA: Diagnosis present

## 2016-05-13 DIAGNOSIS — Z833 Family history of diabetes mellitus: Secondary | ICD-10-CM | POA: Diagnosis not present

## 2016-05-13 DIAGNOSIS — I6522 Occlusion and stenosis of left carotid artery: Secondary | ICD-10-CM | POA: Diagnosis not present

## 2016-05-13 DIAGNOSIS — I25119 Atherosclerotic heart disease of native coronary artery with unspecified angina pectoris: Secondary | ICD-10-CM | POA: Diagnosis not present

## 2016-05-13 DIAGNOSIS — Z794 Long term (current) use of insulin: Secondary | ICD-10-CM | POA: Diagnosis not present

## 2016-05-13 DIAGNOSIS — Z8249 Family history of ischemic heart disease and other diseases of the circulatory system: Secondary | ICD-10-CM | POA: Diagnosis not present

## 2016-05-13 DIAGNOSIS — E7800 Pure hypercholesterolemia, unspecified: Secondary | ICD-10-CM | POA: Diagnosis not present

## 2016-05-13 DIAGNOSIS — Z955 Presence of coronary angioplasty implant and graft: Secondary | ICD-10-CM | POA: Insufficient documentation

## 2016-05-13 DIAGNOSIS — E785 Hyperlipidemia, unspecified: Secondary | ICD-10-CM | POA: Diagnosis not present

## 2016-05-13 DIAGNOSIS — E1142 Type 2 diabetes mellitus with diabetic polyneuropathy: Secondary | ICD-10-CM | POA: Insufficient documentation

## 2016-05-13 DIAGNOSIS — Z981 Arthrodesis status: Secondary | ICD-10-CM | POA: Diagnosis not present

## 2016-05-13 DIAGNOSIS — Z87891 Personal history of nicotine dependence: Secondary | ICD-10-CM | POA: Diagnosis not present

## 2016-05-13 DIAGNOSIS — Z7982 Long term (current) use of aspirin: Secondary | ICD-10-CM | POA: Diagnosis not present

## 2016-05-13 DIAGNOSIS — M1990 Unspecified osteoarthritis, unspecified site: Secondary | ICD-10-CM | POA: Diagnosis not present

## 2016-05-13 DIAGNOSIS — G4730 Sleep apnea, unspecified: Secondary | ICD-10-CM | POA: Diagnosis not present

## 2016-05-13 DIAGNOSIS — K219 Gastro-esophageal reflux disease without esophagitis: Secondary | ICD-10-CM | POA: Diagnosis not present

## 2016-05-13 DIAGNOSIS — Z86010 Personal history of colonic polyps: Secondary | ICD-10-CM | POA: Diagnosis not present

## 2016-05-13 DIAGNOSIS — Z6834 Body mass index (BMI) 34.0-34.9, adult: Secondary | ICD-10-CM | POA: Diagnosis not present

## 2016-05-13 DIAGNOSIS — Z85828 Personal history of other malignant neoplasm of skin: Secondary | ICD-10-CM | POA: Diagnosis not present

## 2016-05-13 DIAGNOSIS — E669 Obesity, unspecified: Secondary | ICD-10-CM | POA: Diagnosis not present

## 2016-05-13 DIAGNOSIS — I10 Essential (primary) hypertension: Secondary | ICD-10-CM | POA: Insufficient documentation

## 2016-05-13 DIAGNOSIS — I209 Angina pectoris, unspecified: Secondary | ICD-10-CM

## 2016-05-13 DIAGNOSIS — Z7902 Long term (current) use of antithrombotics/antiplatelets: Secondary | ICD-10-CM | POA: Diagnosis not present

## 2016-05-13 HISTORY — PX: CARDIAC CATHETERIZATION: SHX172

## 2016-05-13 LAB — GLUCOSE, CAPILLARY: GLUCOSE-CAPILLARY: 172 mg/dL — AB (ref 65–99)

## 2016-05-13 LAB — CARDIAC CATHETERIZATION: CATHEFQUANT: 55 %

## 2016-05-13 SURGERY — LEFT HEART CATH AND CORONARY ANGIOGRAPHY
Anesthesia: LOCAL

## 2016-05-13 MED ORDER — SODIUM CHLORIDE 0.9% FLUSH: 3.0000 mL | Freq: Two times a day (BID) | INTRAVENOUS | Status: DC

## 2016-05-13 MED ORDER — SODIUM CHLORIDE 0.9 % WEIGHT BASED INFUSION: 1.0000 mL/kg/h | INTRAVENOUS | Status: DC

## 2016-05-13 MED ORDER — FENTANYL CITRATE (PF) 100 MCG/2ML IJ SOLN
INTRAMUSCULAR | Status: AC
  Filled 2016-05-13: qty 2

## 2016-05-13 MED ORDER — MIDAZOLAM HCL 2 MG/2ML IJ SOLN
INTRAMUSCULAR | Status: DC | PRN
  Administered 2016-05-13: 2 mg via INTRAVENOUS

## 2016-05-13 MED ORDER — LIDOCAINE HCL (PF) 1 % IJ SOLN
INTRAMUSCULAR | Status: AC
  Filled 2016-05-13: qty 30

## 2016-05-13 MED ORDER — SODIUM CHLORIDE 0.9 % WEIGHT BASED INFUSION: 3.0000 mL/kg/h | INTRAVENOUS | Status: DC

## 2016-05-13 MED ORDER — MIDAZOLAM HCL 2 MG/2ML IJ SOLN
INTRAMUSCULAR | Status: AC
  Filled 2016-05-13: qty 2

## 2016-05-13 MED ORDER — HEPARIN SODIUM (PORCINE) 1000 UNIT/ML IJ SOLN
INTRAMUSCULAR | Status: AC
  Filled 2016-05-13: qty 1

## 2016-05-13 MED ORDER — HEPARIN SODIUM (PORCINE) 1000 UNIT/ML IJ SOLN
INTRAMUSCULAR | Status: DC | PRN
  Administered 2016-05-13: 5000 [IU] via INTRAVENOUS

## 2016-05-13 MED ORDER — SODIUM CHLORIDE 0.9 % IV SOLN: 250.0000 mL | INTRAVENOUS | Status: DC | PRN

## 2016-05-13 MED ORDER — IOPAMIDOL (ISOVUE-370) INJECTION 76%
INTRAVENOUS | Status: DC | PRN
  Administered 2016-05-13: 90 mL

## 2016-05-13 MED ORDER — HEPARIN (PORCINE) IN NACL 2-0.9 UNIT/ML-% IJ SOLN
INTRAMUSCULAR | Status: AC
  Filled 2016-05-13: qty 1000

## 2016-05-13 MED ORDER — VERAPAMIL HCL 2.5 MG/ML IV SOLN
INTRAVENOUS | Status: DC | PRN
  Administered 2016-05-13: 10 mL via INTRA_ARTERIAL

## 2016-05-13 MED ORDER — ISOSORBIDE MONONITRATE ER 60 MG PO TB24: 60.0000 mg | ORAL_TABLET | Freq: Every day | ORAL | 12 refills | Status: DC

## 2016-05-13 MED ORDER — VERAPAMIL HCL 2.5 MG/ML IV SOLN
INTRAVENOUS | Status: AC
  Filled 2016-05-13: qty 2

## 2016-05-13 MED ORDER — SODIUM CHLORIDE 0.9 % WEIGHT BASED INFUSION
3.0000 mL/kg/h | INTRAVENOUS | Status: DC
  Administered 2016-05-13: 3 mL/kg/h via INTRAVENOUS

## 2016-05-13 MED ORDER — LIDOCAINE HCL (PF) 1 % IJ SOLN
INTRAMUSCULAR | Status: DC | PRN
Start: 2016-05-13 — End: 2016-05-13
  Administered 2016-05-13: 2 mL

## 2016-05-13 MED ORDER — IOPAMIDOL (ISOVUE-370) INJECTION 76%
INTRAVENOUS | Status: AC
Start: 2016-05-13 — End: 2016-05-13
  Filled 2016-05-13: qty 100

## 2016-05-13 MED ORDER — HEPARIN (PORCINE) IN NACL 2-0.9 UNIT/ML-% IJ SOLN
INTRAMUSCULAR | Status: AC
  Filled 2016-05-13: qty 500

## 2016-05-13 MED ORDER — FENTANYL CITRATE (PF) 100 MCG/2ML IJ SOLN
INTRAMUSCULAR | Status: DC | PRN
  Administered 2016-05-13: 25 ug via INTRAVENOUS

## 2016-05-13 MED ORDER — SODIUM CHLORIDE 0.9% FLUSH: 3.0000 mL | INTRAVENOUS | Status: DC | PRN

## 2016-05-13 MED ORDER — HEPARIN (PORCINE) IN NACL 2-0.9 UNIT/ML-% IJ SOLN
INTRAMUSCULAR | Status: DC | PRN
  Administered 2016-05-13: 08:00:00

## 2016-05-13 MED ORDER — ASPIRIN 81 MG PO CHEW: 81.0000 mg | CHEWABLE_TABLET | ORAL | Status: DC

## 2016-05-13 SURGICAL SUPPLY — 10 items
CATH 5FR JL3.5 JR4 ANG PIG MP (CATHETERS) ×2 IMPLANT
DEVICE RAD COMP TR BAND LRG (VASCULAR PRODUCTS) ×2 IMPLANT
GLIDESHEATH SLEND SS 6F .021 (SHEATH) ×2 IMPLANT
KIT HEART LEFT (KITS) ×2 IMPLANT
PACK CARDIAC CATHETERIZATION (CUSTOM PROCEDURE TRAY) ×2 IMPLANT
SYR MEDRAD MARK V 150ML (SYRINGE) ×2 IMPLANT
TRANSDUCER W/STOPCOCK (MISCELLANEOUS) ×2 IMPLANT
TUBING CIL FLEX 10 FLL-RA (TUBING) ×2 IMPLANT
WIRE HI TORQ VERSACORE-J 145CM (WIRE) ×2 IMPLANT
WIRE SAFE-T 1.5MM-J .035X260CM (WIRE) ×2 IMPLANT

## 2016-05-13 NOTE — H&P (View-Only) (Signed)
Cardiology Office Note    Date:  05/07/2016   ID:  Seth Young, DOB [birth year], MRN [id number]  PCP:  Geoffery Lyons, MD  Cardiologist:   Candee Furbish, MD     History of Present Illness:  Seth Young is a 75 y.o. male former patient of Dr. Sherryl Barters with diabetes, hypertension, hyperlipidemia, coronary artery disease here for follow-up  In 2015 he underwent heart catheterization after an abnormal nuclear stress test. Fairly diffuse disease. Normal ejection fraction 55%. Culprit vessel was felt to be the obtuse marginal 1. This was treated with PCI and DES. See some catheterization report summarized as below  Vertigo all summer 2017 - no CVA. Still with it. Cut back on exercise. Swim a little.   Felt a pressure in chest similar to GERD prior to stent. Dr. [name] ECG.  Watch diet. Pain certainly has GERD-like suggestion such as when laying down feels worse. He however is quite concerned especially with the increased shortness of breath with exercise. He is worried that his chest pressure may be worsening coronary disease.  Cardiac catheterization from 20152 reviewed. OM stent patent. PDA did not appear graftable. Fairly diffuse moderate CAD.   Swimming, exercise bicycle. Vertigo.    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   Surprised by CAD. Does not have much confidence in nuclear stress test. We discussed.      Past Medical History:  Diagnosis Date  . Arthritis    "knees, toes, hands" (06/26/2014)  . Ataxia   . Basal cell carcinoma    left temporal area-no residual  . Colon polyps 10-12-11   past hx.  . Exertional angina ([place]) 06/26/2014   Cath with DES to the OM, Bioflow protocol   . GERD (gastroesophageal reflux disease)   . H/O hiatal hernia    no problems  . High cholesterol   . Hypertension   . Ischemic chest pain ([place]) 08/07/2014  . Occasional tremors    Bilateral hands  . Peripheral neuropathy ([place])   .  Pneumonia 1940's X 2  . Pseudogout    "it moves around"  . Sleep apnea    no cpap used  . Type II diabetes mellitus ([place]) dx'd 1982   nsulin started ~ 2000  . Vertigo     Past Surgical History:  Procedure Laterality Date  . BACK SURGERY    . BASAL CELL CARCINOMA EXCISION Left ~ 2012   face  . CARDIAC CATHETERIZATION  02/2005   Dr. Mare Ferrari; negative.  Marland Kitchen CAROTID ENDARTERECTOMY Left Ssept. 14, 2016   CEA  . CARPAL TUNNEL RELEASE Left 10/2009  . CARPAL TUNNEL RELEASE Right ~ 2013  . CATARACT EXTRACTION W/ INTRAOCULAR LENS IMPLANT Left 2000's  . CATARACT EXTRACTION W/ INTRAOCULAR LENS IMPLANT Right 2015  . COLONOSCOPY W/ POLYPECTOMY    . CORONARY ANGIOPLASTY WITH STENT PLACEMENT  06/26/2014   "1"  . CORONARY ANGIOPLASTY WITH STENT PLACEMENT  06/26/2014   pLAD 50%, d LAD 60%, oD1 80%,  mD1  70%, CFX 50%, OM 2 70%,  RCA 80%, PDA 95% (<32mm), OM1 99%-0% with Bio flow stent       . ELBOW SURGERY Bilateral ~ 2014   "for blockages; Dr. Amedeo Plenty"  . ENDARTERECTOMY Left 04/23/2015   Procedure: ENDARTERECTOMY CAROTID;  Surgeon: Conrad [place], MD;  Location: [facility];  Service: Vascular;  Laterality: Left;  . KNEE ARTHROSCOPY Left 05/2011   10'12-left knee  . LEFT HEART CATHETERIZATION WITH  CORONARY ANGIOGRAM N/A 06/26/2014   Procedure: LEFT HEART CATHETERIZATION WITH CORONARY ANGIOGRAM;  Surgeon: Blane Ohara, MD;  Location: [facility] CATH LAB;  Service: Cardiovascular;  Laterality: N/A;  . LEFT HEART CATHETERIZATION WITH CORONARY ANGIOGRAM N/A 08/07/2014   Procedure: LEFT HEART CATHETERIZATION WITH CORONARY ANGIOGRAM;  Surgeon: Blane Ohara, MD;  Location: [facility] CATH LAB;  Service: Cardiovascular;  Laterality: N/A;  . NASAL SEPTUM SURGERY  1979  . PATCH ANGIOPLASTY Left 04/23/2015   Procedure: PATCH ANGIOPLASTY USING 1CM X 6CM XENOSURE BIOLOGIC PATCH;  Surgeon: Conrad Terram[place]us, MD;  Location: [facility];  Service: Vascular;  Laterality: Left;  . [place]  . SHOULDER OPEN ROTATOR CUFF  REPAIR  10/14/2011   Procedure: ROTATOR CUFF REPAIR SHOULDER OPEN;  Surgeon: Johnn Hai, MD;  [facility];  Service: Orthopedics;  Laterality: Right;  . SUBACROMIAL DECOMPRESSION  10/14/2011   Procedure: SUBACROMIAL DECOMPRESSION;  Surgeon: Johnn Hai, MD;  [facility];  Service: Orthopedics;  Laterality: Right;  . TRIGGER FINGER RELEASE Right ~ 2013-2014   "3rd & 4th digits"    Current Medications: Outpatient Medications Prior to Visit  Medication Sig Dispense Refill  . acetaminophen (TYLENOL) 325 MG tablet Take 650 mg by mouth at bedtime as needed (sleep).    Marland Kitchen aspirin 81 MG tablet Take 1 tablet (81 mg total) by mouth daily.    Marland Kitchen atorvastatin (LIPITOR) 40 MG tablet TAKE ONE TABLET BY MOUTH ONCE DAILY BEFORE  BREAKFAST 30 tablet 10  . clopidogrel (PLAVIX) 75 MG tablet Take 1 tablet (75 mg total) by mouth daily. 90 tablet 3  . colchicine 0.6 MG tablet Take 0.6 mg by mouth as needed (gout).    Marland Kitchen diphenhydrAMINE (BENADRYL) 25 MG tablet Take 25 mg by mouth at bedtime as needed for sleep.    . dorzolamide-timolol (COSOPT) 22.3-6.8 MG/ML ophthalmic solution Place 1 drop into both eyes 2 (two) times daily.    . empagliflozin (JARDIANCE) 25 MG TABS tablet Take 25 mg by mouth daily.    . insulin glargine (LANTUS) 100 UNIT/ML injection Inject 68 Units into the skin at bedtime.     . insulin lispro (HUMALOG) 100 UNIT/ML injection Inject 0-45 Units into the skin daily as needed for high blood sugar (sliding scale). Sliding scale      . isosorbide mononitrate (IMDUR) 30 MG 24 hr tablet Take 1 tablet (30 mg total) by mouth daily. 30 tablet 8  . lisinopril (PRINIVIL,ZESTRIL) 10 MG tablet TAKE ONE TABLET BY MOUTH ONCE DAILY BEFORE BREAKFAST 90 tablet 2  . LYRICA 100 MG capsule Take 1 capsule by mouth daily as needed. For pain  2  . meclizine (ANTIVERT) 25 MG tablet Take 25 mg by mouth 3 (three) times daily as needed for dizziness.    . metFORMIN (GLUCOPHAGE) 1000 MG tablet TAKE ONE TABLET  BY MOUTH TWICE DAILY WITH A MEAL 60 tablet 0  . metoprolol tartrate (LOPRESSOR) 25 MG tablet Take 1 tablet (25 mg total) by mouth 2 (two) times daily. 180 tablet 3  . nitroGLYCERIN (NITROSTAT) 0.4 MG SL tablet Place 1 tablet (0.4 mg total) under the tongue every 5 (five) minutes as needed for chest pain. 25 tablet 3  . Omega-3 Fatty Acids (FISH OIL PO) Take 1 capsule by mouth daily.    Marland Kitchen omeprazole (PRILOSEC) 40 MG capsule Take 40 mg by mouth 2 (two) times daily.    . Probiotic Product (PROBIOTIC PO) Take 1 tablet by mouth daily.    Marland Kitchen  canagliflozin (INVOKANA) 100 MG TABS tablet Take 100 mg by mouth daily.     No facility-administered medications prior to visit.      Allergies:   Gabapentin; Morphine and related; and Naproxen   Social History   Social History  . Marital status: Married    Spouse name: N/A  . Number of children: 5  . Years of education: College   Social History Main Topics  . Smoking status: Former Smoker    Packs/day: 0.50    Years: 20.00    Types: Cigarettes    Quit date: 10/11/1988  . Smokeless tobacco: Never Used  . Alcohol use No     Comment: Quit drinking in 1990  . Drug use: No  . Sexual activity: No   Other Topics Concern  . None   Social History Narrative   Drinks about 2 cups of coffee a day, occasional diet pepsi.     Quit smoking approximately 27 years ago.  Family History:  The patient's family history includes Cancer in his sister; Diabetes in his brother, mother, and sister; Heart attack (age of onset: 72) in his father; Hypertension in his brother and sister; Varicose Veins in his mother and sister.   ROS:   Please see the history of present illness.    ROS All other systems reviewed and are negative.   PHYSICAL EXAM:   VS:  BP 136/70   Pulse 70   Ht 5' 11.5" (1.816 m)   Wt 251 lb 3.2 oz (113.9 kg)   BMI 34.55 kg/m    GEN: Well nourished, well developed, in no acute distress  HEENT: normal  Neck: no JVD, carotid bruits, or  masses Cardiac: RRR; no murmurs, rubs, or gallops,no edema  Respiratory:  clear to auscultation bilaterally, normal work of breathing GI: soft, nontender, nondistended, + BS MS: no deformity or atrophy  Skin: warm and dry, no rash Neuro:  Alert and Oriented x 3, Strength and sensation are intact Psych: euthymic mood, full affect  Wt Readings from Last 3 Encounters:  05/07/16 251 lb 3.2 oz (113.9 kg)  02/06/16 248 lb (112.5 kg)  01/22/16 247 lb 3.2 oz (112.1 kg)      Studies/Labs Reviewed:   EKG:  None today  Recent Labs: 07/21/2015: BUN 13; Creatinine, Ser 1.16; Hemoglobin 14.6; Platelets 182; Potassium 4.3; Sodium 136   Lipid Panel No results found for: CHOL, TRIG, HDL, CHOLHDL, VLDL, LDLCALC, LDLDIRECT  Additional studies/ records that were reviewed today include:  ECHO  (07/21/15) - Left ventricle: The cavity size was normal. There was severe focal basal and mild concentric hypertrophy of the left ventricle. Systolic function was vigorous. The estimated ejection fraction was in the range of 65% to 70%. Wall motion was normal; there were no regional wall motion abnormalities. Doppler parameters are consistent with abnormal left ventricular relaxation (grade 1 diastolic dysfunction). Doppler parameters are consistent with elevated ventricular end-diastolic filling pressure.    ASSESSMENT:    1. Coronary artery disease due to lipid rich plaque   2. Angina pectoris ([place])   3. Hypercholesterolemia   4. Carotid stenosis, left      PLAN:  In order of problems listed above:  Coronary artery disease/Angina  - He is concerned that his symptoms are starting to progress. More shortness of breath. More chest pressure. It is hard for him to figure out whether this is GERD or coronary disease. He has seen gastroenterology, Dr. Watt Climes in the past. Given the diffuse nature of his  CAD back in 2015, diabetes, worsening symptoms, I wish to proceed with cardiac  catheterization to exclude the possibility of significant triple-vessel disease progression that may require revascularization with bypass. Risks and benefits including stroke, heart attack, death, renal impairment, bleeding discussed, he is willing to proceed.  - Diffuse coronary disease, OM1 vessel DES. No recent anginal symptoms. Had been on dual antiplatelet therapy for a minimum of 12 months. Plavix. Contemplated stopping dual antiplatelet therapy however given his peripheral vascular disease/carotid artery disease we Seth transition to Plavix. I'm aware as well that he is on omeprazole. He had very poor targets for bypass grafting but could be considered for surgical referral if he develops proximal LAD stenosis which is currently at 50%.  Essential hypertension  - Isosorbide, lisinopril, metoprolol. Controlled  Carotid artery disease  - Endarterectomy 04/23/15, left with no TIA symptoms. Dr. Bridgett Larsson. Aggressive secondary prevention  Hyperlipidemia  - Lipitor. PCP has been following this.  Diabetes with insulin use  - Hemoglobin once he 7.0. Medicines reviewed.  Obesity  - Continue to encourage weight loss. This Seth also help his diabetes.  Vertigo  - Still having bad symptoms. After shower better. PT. Walking tough.   Medication Adjustments/Labs and Tests Ordered: Current medicines are reviewed at length with the patient today.  Concerns regarding medicines are outlined above.  Medication changes, Labs and Tests ordered today are listed in the Patient Instructions below. There are no Patient Instructions on file for this visit.   Signed, Candee Furbish, MD  05/07/2016 3:20 PM    [facility] [place], [place], [place]  [address] Phone: [phone]; Fax: [phone]

## 2016-05-13 NOTE — Discharge Instructions (Signed)
Radial Site Care °Refer to this sheet in the next few weeks. These instructions provide you with information about caring for yourself after your procedure. Your health care provider may also give you more specific instructions. Your treatment has been planned according to current medical practices, but problems sometimes occur. Call your health care provider if you have any problems or questions after your procedure. °WHAT TO EXPECT AFTER THE PROCEDURE °After your procedure, it is typical to have the following: °· Bruising at the radial site that usually fades within 1-2 weeks. °· Blood collecting in the tissue (hematoma) that may be painful to the touch. It should usually decrease in size and tenderness within 1-2 weeks. °HOME CARE INSTRUCTIONS °· Take medicines only as directed by your health care provider. °· You may shower 24-48 hours after the procedure or as directed by your health care provider. Remove the bandage (dressing) and gently wash the site with plain soap and water. Pat the area dry with a clean towel. Do not rub the site, because this may cause bleeding. °· Do not take baths, swim, or use a hot tub until your health care provider approves. °· Check your insertion site every day for redness, swelling, or drainage. °· Do not apply powder or lotion to the site. °· Do not flex or bend the affected arm for 24 hours or as directed by your health care provider. °· Do not push or pull heavy objects with the affected arm for 24 hours or as directed by your health care provider. °· Do not lift over 10 lb (4.5 kg) for 5 days after your procedure or as directed by your health care provider. °· Ask your health care provider when it is okay to: °¨ Return to work or school. °¨ Resume usual physical activities or sports. °¨ Resume sexual activity. °· Do not drive home if you are discharged the same day as the procedure. Have someone else drive you. °· You may drive 24 hours after the procedure unless otherwise  instructed by your health care provider. °· Do not operate machinery or power tools for 24 hours after the procedure. °· If your procedure was done as an outpatient procedure, which means that you went home the same day as your procedure, a responsible adult should be with you for the first 24 hours after you arrive home. °· Keep all follow-up visits as directed by your health care provider. This is important. °SEEK MEDICAL CARE IF: °· You have a fever. °· You have chills. °· You have increased bleeding from the radial site. Hold pressure on the site. °SEEK IMMEDIATE MEDICAL CARE IF: °· You have unusual pain at the radial site. °· You have redness, warmth, or swelling at the radial site. °· You have drainage (other than a small amount of blood on the dressing) from the radial site. °· The radial site is bleeding, and the bleeding does not stop after 30 minutes of holding steady pressure on the site. °· Your arm or hand becomes pale, cool, tingly, or numb. °  °This information is not intended to replace advice given to you by your health care provider. Make sure you discuss any questions you have with your health care provider. °  °Document Released: 08/28/2010 Document Revised: 08/16/2014 Document Reviewed: 02/11/2014 °Elsevier Interactive Patient Education ©2016 [employer] ° °

## 2016-05-13 NOTE — Interval H&P Note (Signed)
History and Physical Interval Note:  05/13/2016 7:58 AM  Seth Young  has presented today for surgery, with the diagnosis of cp, known cad, chest pressure  The various methods of treatment have been discussed with the patient and family. After consideration of risks, benefits and other options for treatment, the patient has consented to  Procedure(s): Left Heart Cath and Coronary Angiography (N/A) as a surgical intervention .  The patient's history has been reviewed, patient examined, no change in status, stable for surgery.  I have reviewed the patient's chart and labs.  Questions were answered to the patient's satisfaction.     [employer]

## 2016-05-14 ENCOUNTER — Other Ambulatory Visit: Payer: Self-pay | Admitting: Cardiology

## 2016-05-14 MED ORDER — NITROGLYCERIN 0.4 MG SL SUBL: 0.4000 mg | SUBLINGUAL_TABLET | SUBLINGUAL | 3 refills | Status: DC | PRN

## 2016-05-18 DIAGNOSIS — M9903 Segmental and somatic dysfunction of lumbar region: Secondary | ICD-10-CM | POA: Diagnosis not present

## 2016-05-18 DIAGNOSIS — M9905 Segmental and somatic dysfunction of pelvic region: Secondary | ICD-10-CM | POA: Diagnosis not present

## 2016-05-18 DIAGNOSIS — M5136 Other intervertebral disc degeneration, lumbar region: Secondary | ICD-10-CM | POA: Diagnosis not present

## 2016-05-18 DIAGNOSIS — M9904 Segmental and somatic dysfunction of sacral region: Secondary | ICD-10-CM | POA: Diagnosis not present

## 2016-05-24 DIAGNOSIS — M9905 Segmental and somatic dysfunction of pelvic region: Secondary | ICD-10-CM | POA: Diagnosis not present

## 2016-05-24 DIAGNOSIS — M5136 Other intervertebral disc degeneration, lumbar region: Secondary | ICD-10-CM | POA: Diagnosis not present

## 2016-05-24 DIAGNOSIS — M9903 Segmental and somatic dysfunction of lumbar region: Secondary | ICD-10-CM | POA: Diagnosis not present

## 2016-05-24 DIAGNOSIS — M9904 Segmental and somatic dysfunction of sacral region: Secondary | ICD-10-CM | POA: Diagnosis not present

## 2016-05-25 DIAGNOSIS — M9904 Segmental and somatic dysfunction of sacral region: Secondary | ICD-10-CM | POA: Diagnosis not present

## 2016-05-25 DIAGNOSIS — M9905 Segmental and somatic dysfunction of pelvic region: Secondary | ICD-10-CM | POA: Diagnosis not present

## 2016-05-25 DIAGNOSIS — M9903 Segmental and somatic dysfunction of lumbar region: Secondary | ICD-10-CM | POA: Diagnosis not present

## 2016-05-25 DIAGNOSIS — M5136 Other intervertebral disc degeneration, lumbar region: Secondary | ICD-10-CM | POA: Diagnosis not present

## 2016-05-26 DIAGNOSIS — M9905 Segmental and somatic dysfunction of pelvic region: Secondary | ICD-10-CM | POA: Diagnosis not present

## 2016-05-26 DIAGNOSIS — M9904 Segmental and somatic dysfunction of sacral region: Secondary | ICD-10-CM | POA: Diagnosis not present

## 2016-05-26 DIAGNOSIS — M9903 Segmental and somatic dysfunction of lumbar region: Secondary | ICD-10-CM | POA: Diagnosis not present

## 2016-05-26 DIAGNOSIS — M5136 Other intervertebral disc degeneration, lumbar region: Secondary | ICD-10-CM | POA: Diagnosis not present

## 2016-05-28 DIAGNOSIS — M9905 Segmental and somatic dysfunction of pelvic region: Secondary | ICD-10-CM | POA: Diagnosis not present

## 2016-05-28 DIAGNOSIS — M5136 Other intervertebral disc degeneration, lumbar region: Secondary | ICD-10-CM | POA: Diagnosis not present

## 2016-05-28 DIAGNOSIS — M9904 Segmental and somatic dysfunction of sacral region: Secondary | ICD-10-CM | POA: Diagnosis not present

## 2016-05-28 DIAGNOSIS — M9903 Segmental and somatic dysfunction of lumbar region: Secondary | ICD-10-CM | POA: Diagnosis not present

## 2016-05-31 DIAGNOSIS — M9905 Segmental and somatic dysfunction of pelvic region: Secondary | ICD-10-CM | POA: Diagnosis not present

## 2016-05-31 DIAGNOSIS — M9904 Segmental and somatic dysfunction of sacral region: Secondary | ICD-10-CM | POA: Diagnosis not present

## 2016-05-31 DIAGNOSIS — E782 Mixed hyperlipidemia: Secondary | ICD-10-CM | POA: Diagnosis not present

## 2016-05-31 DIAGNOSIS — M9903 Segmental and somatic dysfunction of lumbar region: Secondary | ICD-10-CM | POA: Diagnosis not present

## 2016-05-31 DIAGNOSIS — I10 Essential (primary) hypertension: Secondary | ICD-10-CM | POA: Diagnosis not present

## 2016-05-31 DIAGNOSIS — M5136 Other intervertebral disc degeneration, lumbar region: Secondary | ICD-10-CM | POA: Diagnosis not present

## 2016-05-31 DIAGNOSIS — E11319 Type 2 diabetes mellitus with unspecified diabetic retinopathy without macular edema: Secondary | ICD-10-CM | POA: Diagnosis not present

## 2016-05-31 DIAGNOSIS — Z125 Encounter for screening for malignant neoplasm of prostate: Secondary | ICD-10-CM | POA: Diagnosis not present

## 2016-06-02 DIAGNOSIS — M9905 Segmental and somatic dysfunction of pelvic region: Secondary | ICD-10-CM | POA: Diagnosis not present

## 2016-06-02 DIAGNOSIS — M9903 Segmental and somatic dysfunction of lumbar region: Secondary | ICD-10-CM | POA: Diagnosis not present

## 2016-06-02 DIAGNOSIS — M9904 Segmental and somatic dysfunction of sacral region: Secondary | ICD-10-CM | POA: Diagnosis not present

## 2016-06-02 DIAGNOSIS — M5136 Other intervertebral disc degeneration, lumbar region: Secondary | ICD-10-CM | POA: Diagnosis not present

## 2016-06-04 DIAGNOSIS — M5136 Other intervertebral disc degeneration, lumbar region: Secondary | ICD-10-CM | POA: Diagnosis not present

## 2016-06-04 DIAGNOSIS — M9904 Segmental and somatic dysfunction of sacral region: Secondary | ICD-10-CM | POA: Diagnosis not present

## 2016-06-04 DIAGNOSIS — M9903 Segmental and somatic dysfunction of lumbar region: Secondary | ICD-10-CM | POA: Diagnosis not present

## 2016-06-04 DIAGNOSIS — M9905 Segmental and somatic dysfunction of pelvic region: Secondary | ICD-10-CM | POA: Diagnosis not present

## 2016-06-07 DIAGNOSIS — D751 Secondary polycythemia: Secondary | ICD-10-CM | POA: Diagnosis not present

## 2016-06-07 DIAGNOSIS — Z6835 Body mass index (BMI) 35.0-35.9, adult: Secondary | ICD-10-CM | POA: Diagnosis not present

## 2016-06-07 DIAGNOSIS — G4733 Obstructive sleep apnea (adult) (pediatric): Secondary | ICD-10-CM | POA: Diagnosis not present

## 2016-06-07 DIAGNOSIS — M1990 Unspecified osteoarthritis, unspecified site: Secondary | ICD-10-CM | POA: Diagnosis not present

## 2016-06-07 DIAGNOSIS — E118 Type 2 diabetes mellitus with unspecified complications: Secondary | ICD-10-CM | POA: Diagnosis not present

## 2016-06-07 DIAGNOSIS — E1140 Type 2 diabetes mellitus with diabetic neuropathy, unspecified: Secondary | ICD-10-CM | POA: Diagnosis not present

## 2016-06-07 DIAGNOSIS — E1139 Type 2 diabetes mellitus with other diabetic ophthalmic complication: Secondary | ICD-10-CM | POA: Diagnosis not present

## 2016-06-07 DIAGNOSIS — Z0000 Encounter for general adult medical examination without abnormal findings: Secondary | ICD-10-CM | POA: Diagnosis not present

## 2016-06-07 DIAGNOSIS — J449 Chronic obstructive pulmonary disease, unspecified: Secondary | ICD-10-CM | POA: Diagnosis not present

## 2016-06-07 DIAGNOSIS — E782 Mixed hyperlipidemia: Secondary | ICD-10-CM | POA: Diagnosis not present

## 2016-06-08 DIAGNOSIS — M9904 Segmental and somatic dysfunction of sacral region: Secondary | ICD-10-CM | POA: Diagnosis not present

## 2016-06-08 DIAGNOSIS — M9903 Segmental and somatic dysfunction of lumbar region: Secondary | ICD-10-CM | POA: Diagnosis not present

## 2016-06-08 DIAGNOSIS — M9905 Segmental and somatic dysfunction of pelvic region: Secondary | ICD-10-CM | POA: Diagnosis not present

## 2016-06-08 DIAGNOSIS — M5136 Other intervertebral disc degeneration, lumbar region: Secondary | ICD-10-CM | POA: Diagnosis not present

## 2016-06-11 DIAGNOSIS — M5136 Other intervertebral disc degeneration, lumbar region: Secondary | ICD-10-CM | POA: Diagnosis not present

## 2016-06-11 DIAGNOSIS — M9903 Segmental and somatic dysfunction of lumbar region: Secondary | ICD-10-CM | POA: Diagnosis not present

## 2016-06-11 DIAGNOSIS — M9905 Segmental and somatic dysfunction of pelvic region: Secondary | ICD-10-CM | POA: Diagnosis not present

## 2016-06-11 DIAGNOSIS — M9904 Segmental and somatic dysfunction of sacral region: Secondary | ICD-10-CM | POA: Diagnosis not present

## 2016-06-16 DIAGNOSIS — M9905 Segmental and somatic dysfunction of pelvic region: Secondary | ICD-10-CM | POA: Diagnosis not present

## 2016-06-16 DIAGNOSIS — M9903 Segmental and somatic dysfunction of lumbar region: Secondary | ICD-10-CM | POA: Diagnosis not present

## 2016-06-16 DIAGNOSIS — M9904 Segmental and somatic dysfunction of sacral region: Secondary | ICD-10-CM | POA: Diagnosis not present

## 2016-06-16 DIAGNOSIS — M5136 Other intervertebral disc degeneration, lumbar region: Secondary | ICD-10-CM | POA: Diagnosis not present

## 2016-06-18 DIAGNOSIS — E1129 Type 2 diabetes mellitus with other diabetic kidney complication: Secondary | ICD-10-CM | POA: Diagnosis not present

## 2016-06-21 DIAGNOSIS — M9903 Segmental and somatic dysfunction of lumbar region: Secondary | ICD-10-CM | POA: Diagnosis not present

## 2016-06-21 DIAGNOSIS — M5136 Other intervertebral disc degeneration, lumbar region: Secondary | ICD-10-CM | POA: Diagnosis not present

## 2016-06-21 DIAGNOSIS — M9904 Segmental and somatic dysfunction of sacral region: Secondary | ICD-10-CM | POA: Diagnosis not present

## 2016-06-21 DIAGNOSIS — M9905 Segmental and somatic dysfunction of pelvic region: Secondary | ICD-10-CM | POA: Diagnosis not present

## 2016-06-24 DIAGNOSIS — M5136 Other intervertebral disc degeneration, lumbar region: Secondary | ICD-10-CM | POA: Diagnosis not present

## 2016-06-24 DIAGNOSIS — M9904 Segmental and somatic dysfunction of sacral region: Secondary | ICD-10-CM | POA: Diagnosis not present

## 2016-06-24 DIAGNOSIS — M9903 Segmental and somatic dysfunction of lumbar region: Secondary | ICD-10-CM | POA: Diagnosis not present

## 2016-06-24 DIAGNOSIS — M9905 Segmental and somatic dysfunction of pelvic region: Secondary | ICD-10-CM | POA: Diagnosis not present

## 2016-06-28 DIAGNOSIS — M9903 Segmental and somatic dysfunction of lumbar region: Secondary | ICD-10-CM | POA: Diagnosis not present

## 2016-06-28 DIAGNOSIS — M9904 Segmental and somatic dysfunction of sacral region: Secondary | ICD-10-CM | POA: Diagnosis not present

## 2016-06-28 DIAGNOSIS — M5136 Other intervertebral disc degeneration, lumbar region: Secondary | ICD-10-CM | POA: Diagnosis not present

## 2016-06-28 DIAGNOSIS — M9905 Segmental and somatic dysfunction of pelvic region: Secondary | ICD-10-CM | POA: Diagnosis not present

## 2016-07-07 DIAGNOSIS — M9905 Segmental and somatic dysfunction of pelvic region: Secondary | ICD-10-CM | POA: Diagnosis not present

## 2016-07-07 DIAGNOSIS — M9904 Segmental and somatic dysfunction of sacral region: Secondary | ICD-10-CM | POA: Diagnosis not present

## 2016-07-07 DIAGNOSIS — M5136 Other intervertebral disc degeneration, lumbar region: Secondary | ICD-10-CM | POA: Diagnosis not present

## 2016-07-07 DIAGNOSIS — M9903 Segmental and somatic dysfunction of lumbar region: Secondary | ICD-10-CM | POA: Diagnosis not present

## 2016-07-13 DIAGNOSIS — I10 Essential (primary) hypertension: Secondary | ICD-10-CM | POA: Diagnosis not present

## 2016-07-13 DIAGNOSIS — Z6834 Body mass index (BMI) 34.0-34.9, adult: Secondary | ICD-10-CM | POA: Diagnosis not present

## 2016-07-13 DIAGNOSIS — N182 Chronic kidney disease, stage 2 (mild): Secondary | ICD-10-CM | POA: Diagnosis not present

## 2016-07-13 DIAGNOSIS — E1129 Type 2 diabetes mellitus with other diabetic kidney complication: Secondary | ICD-10-CM | POA: Diagnosis not present

## 2016-07-14 DIAGNOSIS — M9903 Segmental and somatic dysfunction of lumbar region: Secondary | ICD-10-CM | POA: Diagnosis not present

## 2016-07-14 DIAGNOSIS — M9905 Segmental and somatic dysfunction of pelvic region: Secondary | ICD-10-CM | POA: Diagnosis not present

## 2016-07-14 DIAGNOSIS — M5136 Other intervertebral disc degeneration, lumbar region: Secondary | ICD-10-CM | POA: Diagnosis not present

## 2016-07-14 DIAGNOSIS — M9904 Segmental and somatic dysfunction of sacral region: Secondary | ICD-10-CM | POA: Diagnosis not present

## 2016-07-27 DIAGNOSIS — M9905 Segmental and somatic dysfunction of pelvic region: Secondary | ICD-10-CM | POA: Diagnosis not present

## 2016-07-27 DIAGNOSIS — M9903 Segmental and somatic dysfunction of lumbar region: Secondary | ICD-10-CM | POA: Diagnosis not present

## 2016-07-27 DIAGNOSIS — M9904 Segmental and somatic dysfunction of sacral region: Secondary | ICD-10-CM | POA: Diagnosis not present

## 2016-07-27 DIAGNOSIS — M5136 Other intervertebral disc degeneration, lumbar region: Secondary | ICD-10-CM | POA: Diagnosis not present

## 2016-08-19 DIAGNOSIS — M9904 Segmental and somatic dysfunction of sacral region: Secondary | ICD-10-CM | POA: Diagnosis not present

## 2016-08-19 DIAGNOSIS — M9903 Segmental and somatic dysfunction of lumbar region: Secondary | ICD-10-CM | POA: Diagnosis not present

## 2016-08-19 DIAGNOSIS — M5136 Other intervertebral disc degeneration, lumbar region: Secondary | ICD-10-CM | POA: Diagnosis not present

## 2016-08-19 DIAGNOSIS — M9905 Segmental and somatic dysfunction of pelvic region: Secondary | ICD-10-CM | POA: Diagnosis not present

## 2016-09-03 DIAGNOSIS — M9904 Segmental and somatic dysfunction of sacral region: Secondary | ICD-10-CM | POA: Diagnosis not present

## 2016-09-03 DIAGNOSIS — M9903 Segmental and somatic dysfunction of lumbar region: Secondary | ICD-10-CM | POA: Diagnosis not present

## 2016-09-03 DIAGNOSIS — M5136 Other intervertebral disc degeneration, lumbar region: Secondary | ICD-10-CM | POA: Diagnosis not present

## 2016-09-03 DIAGNOSIS — M9905 Segmental and somatic dysfunction of pelvic region: Secondary | ICD-10-CM | POA: Diagnosis not present

## 2016-09-18 DIAGNOSIS — E1129 Type 2 diabetes mellitus with other diabetic kidney complication: Secondary | ICD-10-CM | POA: Diagnosis not present

## 2016-09-26 ENCOUNTER — Other Ambulatory Visit: Payer: Self-pay | Admitting: Physician Assistant

## 2016-09-28 NOTE — Telephone Encounter (Signed)
Please obtain from PCP

## 2016-09-28 NOTE — Telephone Encounter (Signed)
Should this be deferred to patients pcp? There is not a recent lipid panel in epic and per last office visit pcp follows his hyperlipidemia. Please advise. Thanks, MI

## 2016-10-05 DIAGNOSIS — M9904 Segmental and somatic dysfunction of sacral region: Secondary | ICD-10-CM | POA: Diagnosis not present

## 2016-10-05 DIAGNOSIS — M5136 Other intervertebral disc degeneration, lumbar region: Secondary | ICD-10-CM | POA: Diagnosis not present

## 2016-10-05 DIAGNOSIS — M9903 Segmental and somatic dysfunction of lumbar region: Secondary | ICD-10-CM | POA: Diagnosis not present

## 2016-10-05 DIAGNOSIS — M9905 Segmental and somatic dysfunction of pelvic region: Secondary | ICD-10-CM | POA: Diagnosis not present

## 2016-10-18 DIAGNOSIS — N182 Chronic kidney disease, stage 2 (mild): Secondary | ICD-10-CM | POA: Diagnosis not present

## 2016-10-18 DIAGNOSIS — R05 Cough: Secondary | ICD-10-CM | POA: Diagnosis not present

## 2016-10-18 DIAGNOSIS — E1129 Type 2 diabetes mellitus with other diabetic kidney complication: Secondary | ICD-10-CM | POA: Diagnosis not present

## 2016-10-18 DIAGNOSIS — I2510 Atherosclerotic heart disease of native coronary artery without angina pectoris: Secondary | ICD-10-CM | POA: Diagnosis not present

## 2016-10-26 ENCOUNTER — Telehealth: Payer: Self-pay | Admitting: Cardiology

## 2016-10-26 NOTE — Telephone Encounter (Signed)
New message      Pt c/o of Chest Pain: STAT if CP now or developed within 24 hours  1. Are you having CP right now?  no  2. Are you experiencing any other symptoms (ex. SOB, nausea, vomiting, sweating)?  No.  However, pt saw Dr [name] last Monday and was given a steroid shot, put on claritin, mucinix 12hr and cough syrup.  Chest discomfort started after this. Pt has stopped over the counter rx and chest discomfort started back.  3. How long have you been experiencing CP?  For about 2 days 4. Is your CP continuous or coming and going?  Not sure-----feels like heart burn when it happens 5. Have you taken Nitroglycerin?  Yes ---off and on?

## 2016-10-26 NOTE — Telephone Encounter (Signed)
Spoke with pt who is reporting that for the past couple of nights he has had chest pain with lying down at night to sleep.  He reports this feels like heartburn/indigestion.  He has had relief with sitting up and/or using antiacids.  He has also taken SL Ntg which has helped as well.  He denies having any active CP/discomfort now.  He has not had any N/V, no SOB with the discomfort and no radiation.  He has not taken his HR/BP during this time.  Today he has tried to eat better and hopefully not have the indigestion/heartburn when he tries to lay down.  He understands I will forward this information to Dr Marlou Porch to review and I will call back with any instruction/orders.  Pt understands to call 911 if he has to use 3 SL Ntg to relieve his chest pain.

## 2016-10-29 NOTE — Telephone Encounter (Signed)
Agree with plan Aviendha Azbell, MD  

## 2016-10-31 ENCOUNTER — Other Ambulatory Visit: Payer: Self-pay | Admitting: Physician Assistant

## 2016-11-03 DIAGNOSIS — E113593 Type 2 diabetes mellitus with proliferative diabetic retinopathy without macular edema, bilateral: Secondary | ICD-10-CM | POA: Diagnosis not present

## 2016-11-03 DIAGNOSIS — H3582 Retinal ischemia: Secondary | ICD-10-CM | POA: Diagnosis not present

## 2016-11-03 DIAGNOSIS — H348322 Tributary (branch) retinal vein occlusion, left eye, stable: Secondary | ICD-10-CM | POA: Diagnosis not present

## 2016-11-03 DIAGNOSIS — H35431 Paving stone degeneration of retina, right eye: Secondary | ICD-10-CM | POA: Diagnosis not present

## 2016-11-03 LAB — HM DIABETES EYE EXAM

## 2016-11-17 DIAGNOSIS — M9903 Segmental and somatic dysfunction of lumbar region: Secondary | ICD-10-CM | POA: Diagnosis not present

## 2016-11-17 DIAGNOSIS — M9901 Segmental and somatic dysfunction of cervical region: Secondary | ICD-10-CM | POA: Diagnosis not present

## 2016-11-17 DIAGNOSIS — M5031 Other cervical disc degeneration,  high cervical region: Secondary | ICD-10-CM | POA: Diagnosis not present

## 2016-11-17 DIAGNOSIS — M5136 Other intervertebral disc degeneration, lumbar region: Secondary | ICD-10-CM | POA: Diagnosis not present

## 2016-12-18 DIAGNOSIS — E1129 Type 2 diabetes mellitus with other diabetic kidney complication: Secondary | ICD-10-CM | POA: Diagnosis not present

## 2016-12-29 DIAGNOSIS — J208 Acute bronchitis due to other specified organisms: Secondary | ICD-10-CM | POA: Diagnosis not present

## 2016-12-29 DIAGNOSIS — J181 Lobar pneumonia, unspecified organism: Secondary | ICD-10-CM | POA: Diagnosis not present

## 2016-12-29 DIAGNOSIS — N182 Chronic kidney disease, stage 2 (mild): Secondary | ICD-10-CM | POA: Diagnosis not present

## 2016-12-29 DIAGNOSIS — R05 Cough: Secondary | ICD-10-CM | POA: Diagnosis not present

## 2017-01-05 ENCOUNTER — Other Ambulatory Visit: Payer: Self-pay | Admitting: Cardiology

## 2017-01-28 ENCOUNTER — Other Ambulatory Visit: Payer: Self-pay | Admitting: Cardiology

## 2017-02-11 ENCOUNTER — Ambulatory Visit: Payer: Medicare Other | Admitting: Family

## 2017-02-11 ENCOUNTER — Encounter (HOSPITAL_COMMUNITY): Payer: Medicare Other

## 2017-02-11 ENCOUNTER — Encounter: Payer: Self-pay | Admitting: Family

## 2017-02-17 ENCOUNTER — Other Ambulatory Visit: Payer: Self-pay

## 2017-02-17 DIAGNOSIS — I739 Peripheral vascular disease, unspecified: Principal | ICD-10-CM

## 2017-02-17 DIAGNOSIS — I779 Disorder of arteries and arterioles, unspecified: Secondary | ICD-10-CM

## 2017-02-21 DIAGNOSIS — M5136 Other intervertebral disc degeneration, lumbar region: Secondary | ICD-10-CM | POA: Diagnosis not present

## 2017-02-21 DIAGNOSIS — M9905 Segmental and somatic dysfunction of pelvic region: Secondary | ICD-10-CM | POA: Diagnosis not present

## 2017-02-21 DIAGNOSIS — M9904 Segmental and somatic dysfunction of sacral region: Secondary | ICD-10-CM | POA: Diagnosis not present

## 2017-02-21 DIAGNOSIS — M9903 Segmental and somatic dysfunction of lumbar region: Secondary | ICD-10-CM | POA: Diagnosis not present

## 2017-02-23 ENCOUNTER — Encounter: Payer: Self-pay | Admitting: Family

## 2017-02-23 ENCOUNTER — Ambulatory Visit (HOSPITAL_COMMUNITY)
Admission: RE | Admit: 2017-02-23 | Discharge: 2017-02-23 | Disposition: A | Discharge status: Home / Self Care | Payer: Medicare Other | Source: Ambulatory Visit | Attending: Vascular Surgery | Admitting: Vascular Surgery

## 2017-02-23 ENCOUNTER — Ambulatory Visit (INDEPENDENT_AMBULATORY_CARE_PROVIDER_SITE_OTHER): Payer: Medicare Other | Admitting: Family

## 2017-02-23 VITALS — BP 112/62 | HR 76 | Temp 97.9°F | Resp 18 | Ht 71.5 in | Wt 251.0 lb

## 2017-02-23 DIAGNOSIS — R0789 Other chest pain: Secondary | ICD-10-CM | POA: Diagnosis not present

## 2017-02-23 DIAGNOSIS — Z9889 Other specified postprocedural states: Secondary | ICD-10-CM | POA: Diagnosis not present

## 2017-02-23 DIAGNOSIS — I6522 Occlusion and stenosis of left carotid artery: Secondary | ICD-10-CM

## 2017-02-23 DIAGNOSIS — I739 Peripheral vascular disease, unspecified: Secondary | ICD-10-CM

## 2017-02-23 DIAGNOSIS — I779 Disorder of arteries and arterioles, unspecified: Secondary | ICD-10-CM | POA: Insufficient documentation

## 2017-02-23 DIAGNOSIS — E1129 Type 2 diabetes mellitus with other diabetic kidney complication: Secondary | ICD-10-CM | POA: Diagnosis not present

## 2017-02-23 DIAGNOSIS — I2510 Atherosclerotic heart disease of native coronary artery without angina pectoris: Secondary | ICD-10-CM | POA: Diagnosis not present

## 2017-02-23 DIAGNOSIS — Z8669 Personal history of other diseases of the nervous system and sense organs: Secondary | ICD-10-CM | POA: Diagnosis not present

## 2017-02-23 DIAGNOSIS — N182 Chronic kidney disease, stage 2 (mild): Secondary | ICD-10-CM | POA: Diagnosis not present

## 2017-02-23 NOTE — Progress Notes (Signed)
Chief Complaint: Follow up Extracranial Carotid Artery Stenosis   History of Present Illness  Seth Young is a 76 y.o. male who presents with chief complaint: routine surveillance.  Pt is s/p L CEA (04/23/15) by Dr. Bridgett Larsson for L sx ICA stenosis >90%.  Previous carotid studies demonstrated: RICA <81% stenosis, LICA >19% stenosis.  Patient has history of stroke symptoms.  The patient has had visual field loss felt related to carotid disease.  The patient has never had unilateral facial drooping or hemiplegia.  The patient has never had receptive or expressive aphasia.  The patient's previous neurologic deficits have mostly resolved.  He states it has been suggested that he needs a left knee replacement.   He swims a great deal.  He had a cardiac stent placed in 2015, denies any history of MI.  He has had issues with vertigo for a long time.   He does not seem to have claudication symptoms with walking.   Pt state he has occasional dyspnea which started in May or June 2018, states occasional cough from pollen. He states he was treated for pneumonia by his PCP in May 2018.   Pt Diabetic: yes, states his A1C today was 6.4 Pt smoker: former smoker, quit in 1990, smoked x 20 years  Pt meds include: Statin : yes ASA: yes Other anticoagulants/antiplatelets: Plavix   Past Medical History:  Diagnosis Date  . Arthritis    "knees, toes, hands" (06/26/2014)  . Ataxia   . Basal cell carcinoma    left temporal area-no residual  . Colon polyps 10-12-11   past hx.  . Exertional angina ([place]) 06/26/2014   Cath with DES to the OM, Bioflow protocol   . GERD (gastroesophageal reflux disease)   . H/O hiatal hernia    no problems  . High cholesterol   . Hypertension   . Ischemic chest pain 08/07/2014  . Occasional tremors    Bilateral hands  . Peripheral neuropathy   . Pneumonia 1940's X 2  . Pseudogout    "it moves around"  . Sleep apnea    no cpap used  . Type II diabetes mellitus  ([place]) dx'd 1982   nsulin started ~ 2000  . Vertigo     Social History Social History  Substance Use Topics  . Smoking status: Former Smoker    Packs/day: 0.50    Years: 20.00    Types: Cigarettes    Quit date: 10/11/1988  . Smokeless tobacco: Never Used  . Alcohol use No     Comment: Quit drinking in 1990    Family History Family History  Problem Relation Age of Onset  . Diabetes Mother   . Varicose Veins Mother   . Heart attack Father 70       "massive heart attack"  . Cancer Sister   . Diabetes Sister   . Hypertension Sister   . Varicose Veins Sister   . Diabetes Brother   . Hypertension Brother     Surgical History Past Surgical History:  Procedure Laterality Date  . BACK SURGERY    . BASAL CELL CARCINOMA EXCISION Left ~ 2012   face  . CARDIAC CATHETERIZATION  02/2005   Dr. Mare Ferrari; negative.  Marland Kitchen CARDIAC CATHETERIZATION N/A 05/13/2016   Procedure: Left Heart Cath and Coronary Angiography;  Surgeon: Jerline Pain, MD;  Location: [facility];  Service: Cardiovascular;  Laterality: N/A;  . CAROTID ENDARTERECTOMY Left Ssept. 14, 2016   CEA  . CARPAL  TUNNEL RELEASE Left 10/2009  . CARPAL TUNNEL RELEASE Right ~ 2013  . CATARACT EXTRACTION W/ INTRAOCULAR LENS IMPLANT Left 2000's  . CATARACT EXTRACTION W/ INTRAOCULAR LENS IMPLANT Right 2015  . COLONOSCOPY W/ POLYPECTOMY    . CORONARY ANGIOPLASTY WITH STENT PLACEMENT  06/26/2014   "1"  . CORONARY ANGIOPLASTY WITH STENT PLACEMENT  06/26/2014   pLAD 50%, d LAD 60%, oD1 80%,  mD1  70%, CFX 50%, OM 2 70%,  RCA 80%, PDA 95% (<91mm), OM1 99%-0% with Bio flow stent       . ELBOW SURGERY Bilateral ~ 2014   "for blockages; Dr. Amedeo Plenty"  . ENDARTERECTOMY Left 04/23/2015   Procedure: ENDARTERECTOMY CAROTID;  Surgeon: Conrad [place], MD;  Location: [facility];  Service: Vascular;  Laterality: Left;  . KNEE ARTHROSCOPY Left 05/2011   10'12-left knee  . LEFT HEART CATHETERIZATION WITH CORONARY ANGIOGRAM N/A 06/26/2014   Procedure:  LEFT HEART CATHETERIZATION WITH CORONARY ANGIOGRAM;  Surgeon: Blane Ohara, MD;  Location: [facility];  Service: Cardiovascular;  Laterality: N/A;  . LEFT HEART CATHETERIZATION WITH CORONARY ANGIOGRAM N/A 08/07/2014   Procedure: LEFT HEART CATHETERIZATION WITH CORONARY ANGIOGRAM;  Surgeon: Blane Ohara, MD;  Location: [facility] CATH LAB;  Service: Cardiovascular;  Laterality: N/A;  . NASAL SEPTUM SURGERY  1979  . PATCH ANGIOPLASTY Left 04/23/2015   Procedure: PATCH ANGIOPLASTY USING 1CM X 6CM XENOSURE BIOLOGIC PATCH;  Surgeon: Conrad Malmo, MD;  Location: [facility];  Service: Vascular;  Laterality: Left;  . [place]  . SHOULDER OPEN ROTATOR CUFF REPAIR  10/14/2011   Procedure: ROTATOR CUFF REPAIR SHOULDER OPEN;  Surgeon: Johnn Hai, MD;  Location: [facility];  Service: Orthopedics;  Laterality: Right;  . SUBACROMIAL DECOMPRESSION  10/14/2011   Procedure: SUBACROMIAL DECOMPRESSION;  Surgeon: Johnn Hai, MD;  Location: [facility];  Service: Orthopedics;  Laterality: Right;  . TRIGGER FINGER RELEASE Right ~ 2013-2014   "3rd & 4th digits"    Allergies  Allergen Reactions  . Gabapentin     Ataxia  . Morphine And Related Itching    Itching   . Naproxen Other (See Comments)    Tremors, hallucinates     Current Outpatient Prescriptions  Medication Sig Dispense Refill  . acetaminophen (TYLENOL) 325 MG tablet Take 650 mg by mouth at bedtime as needed (sleep).    Marland Kitchen aspirin 81 MG tablet Take 1 tablet (81 mg total) by mouth daily.    Marland Kitchen atorvastatin (LIPITOR) 40 MG tablet TAKE ONE TABLET BY MOUTH ONCE DAILY BEFORE  BREAKFAST 90 tablet 1  . clopidogrel (PLAVIX) 75 MG tablet TAKE ONE TABLET BY MOUTH ONCE DAILY 90 tablet 0  . colchicine 0.6 MG tablet Take 0.6 mg by mouth 2 (two) times daily as needed (gout).     Marland Kitchen diphenhydrAMINE (BENADRYL) 25 MG tablet Take 25 mg by mouth at bedtime as needed for sleep.    . dorzolamide-timolol (COSOPT) 22.3-6.8 MG/ML ophthalmic solution Place 1  drop into both eyes 2 (two) times daily.    . empagliflozin (JARDIANCE) 25 MG TABS tablet Take 25 mg by mouth daily.    . insulin glargine (LANTUS) 100 UNIT/ML injection Inject 80 Units into the skin at bedtime.     . insulin lispro (HUMALOG) 100 UNIT/ML injection Inject 8 Units into the skin 3 (three) times daily with meals. Sliding scale       . Insulin Pen Needle (FIFTY50 PEN NEEDLES) 31G X 8 MM MISC     .  isosorbide mononitrate (IMDUR) 60 MG 24 hr tablet Take 1 tablet (60 mg total) by mouth daily. 30 tablet 12  . lisinopril (PRINIVIL,ZESTRIL) 10 MG tablet TAKE ONE TABLET BY MOUTH ONCE DAILY BEFORE BREAKFAST 90 tablet 0  . LYRICA 100 MG capsule Take 1 capsule by mouth 3 (three) times daily as needed. For neuropathic pain  2  . metFORMIN (GLUCOPHAGE) 1000 MG tablet TAKE ONE TABLET BY MOUTH TWICE DAILY WITH A MEAL 60 tablet 0  . metoprolol tartrate (LOPRESSOR) 25 MG tablet TAKE ONE TABLET BY MOUTH TWICE DAILY 180 tablet 0  . Multiple Vitamin (MULTIVITAMIN) tablet Take 1 tablet by mouth daily.    . nitroGLYCERIN (NITROSTAT) 0.4 MG SL tablet Place 1 tablet (0.4 mg total) under the tongue every 5 (five) minutes as needed for chest pain. 25 tablet 3  . Omega-3 Fatty Acids (FISH OIL) 600 MG CAPS Take 600 mg by mouth daily.    Marland Kitchen omeprazole (PRILOSEC) 40 MG capsule Take 40 mg by mouth 2 (two) times daily.    . Probiotic Product (PROBIOTIC PO) Take 1 tablet by mouth daily.     No current facility-administered medications for this visit.     Review of Systems : See HPI for pertinent positives and negatives.  Physical Examination  Vitals:   02/23/17 1211 02/23/17 1213  BP: 107/63 112/62  Pulse: 73 76  Resp: 18   Temp: 97.9 F (36.6 C)   Weight: 251 lb (113.9 kg)   Height: 5' 11.5" (1.816 m)    Body mass index is 34.52 kg/m.  General: A&O x 3, WDWN obese male  Eyes: PERRLA  Neck: Supple, no nuchal rigidity, no palpable LAD  Pulmonary: Sym exp,respirations are non labored, limited  air movt or distant breath sounds, CTAB, no rales, rhonchi, or wheezing  Cardiac: RRR, Nl S1, S2, no appreciable murmur  Vascular: Vessel Right Left  Radial 2+Palpable 2+Palpable  Carotid Palpable, without bruit Palpable, without bruit  Aorta Not palpable N/A  Popliteal Not palpable Not palpable  PT 2+Palpable 2+Palpable  DP 1+Palpable 1+Palpable   Gastrointestinal: soft, NTND, no G/R, no HSM, no palpable masses, no CVAT B  Musculoskeletal: M/S 5/5 throughout, extremities without ischemic changes   Neurologic: CN 2-12 intact, pain and light touch intact in extremities, motor exam as listed above     Assessment: Seth Young is a 76 y.o. male who is s/p  L CEA (04/23/15). The patient had some visual field loss prior to the left CEA, no subsequent neurological events.  His atherosclerotic risk factors include well controlled DM, former smoker, CAD, OSA, dyslipidemia, and obesity.  He takes a daily ASA, Plavix, and a statin.    DATA Carotid Duplex (02/23/17): Right ICA: 1-39% Left ICA: CEA site with no restenosis, with mild hyperplasia at the surgical bulb. Bilateral vertebral artery flow is antegrade.  Bilateral subclavian artery waveforms are normal.  No significant change in comparison to the last exam on 02-06-16.   Plan: Follow-up in 1 year with Carotid Duplex scan. I discussed in depth with the patient the nature of atherosclerosis, and emphasized the importance of maximal medical management including strict control of blood pressure, blood glucose, and lipid levels, obtaining regular exercise, and continued cessation of smoking.  The patient is aware that without maximal medical management the underlying atherosclerotic disease process will progress, limiting the benefit of any interventions. The patient was given information about stroke prevention and what symptoms should prompt the patient to seek immediate medical care. Thank  you for allowing [place] to participate in  this patient's care.  Clemon Chambers, RN, MSN, FNP-C [facility] Office: [phone]  Clinic Physician: Early on call  02/23/17 12:22 PM

## 2017-02-23 NOTE — Patient Instructions (Signed)
Stroke Prevention Some medical conditions and behaviors are associated with an increased chance of having a stroke. You may prevent a stroke by making healthy choices and managing medical conditions. How can I reduce my risk of having a stroke?  Stay physically active. Get at least 30 minutes of activity on most or all days.  Do not smoke. It may also be helpful to avoid exposure to secondhand smoke.  Limit alcohol use. Moderate alcohol use is considered to be: ? No more than 2 drinks per day for men. ? No more than 1 drink per day for nonpregnant women.  Eat healthy foods. This involves: ? Eating 5 or more servings of fruits and vegetables a day. ? Making dietary changes that address high blood pressure (hypertension), high cholesterol, diabetes, or obesity.  Manage your cholesterol levels. ? Making food choices that are high in fiber and low in saturated fat, trans fat, and cholesterol may control cholesterol levels. ? Take any prescribed medicines to control cholesterol as directed by your health care provider.  Manage your diabetes. ? Controlling your carbohydrate and sugar intake is recommended to manage diabetes. ? Take any prescribed medicines to control diabetes as directed by your health care provider.  Control your hypertension. ? Making food choices that are low in salt (sodium), saturated fat, trans fat, and cholesterol is recommended to manage hypertension. ? Ask your health care provider if you need treatment to lower your blood pressure. Take any prescribed medicines to control hypertension as directed by your health care provider. ? If you are 18-39 years of age, have your blood pressure checked every 3-5 years. If you are 40 years of age or older, have your blood pressure checked every year.  Maintain a healthy weight. ? Reducing calorie intake and making food choices that are low in sodium, saturated fat, trans fat, and cholesterol are recommended to manage  weight.  Stop drug abuse.  Avoid taking birth control pills. ? Talk to your health care provider about the risks of taking birth control pills if you are over 35 years old, smoke, get migraines, or have ever had a blood clot.  Get evaluated for sleep disorders (sleep apnea). ? Talk to your health care provider about getting a sleep evaluation if you snore a lot or have excessive sleepiness.  Take medicines only as directed by your health care provider. ? For some people, aspirin or blood thinners (anticoagulants) are helpful in reducing the risk of forming abnormal blood clots that can lead to stroke. If you have the irregular heart rhythm of atrial fibrillation, you should be on a blood thinner unless there is a good reason you cannot take them. ? Understand all your medicine instructions.  Make sure that other conditions (such as anemia or atherosclerosis) are addressed. Get help right away if:  You have sudden weakness or numbness of the face, arm, or leg, especially on one side of the body.  Your face or eyelid droops to one side.  You have sudden confusion.  You have trouble speaking (aphasia) or understanding.  You have sudden trouble seeing in one or both eyes.  You have sudden trouble walking.  You have dizziness.  You have a loss of balance or coordination.  You have a sudden, severe headache with no known cause.  You have new chest pain or an irregular heartbeat. Any of these symptoms may represent a serious problem that is an emergency. Do not wait to see if the symptoms will go away.   Get medical help at once. Call your local emergency services (911 in [place] Do not drive yourself to the hospital. This information is not intended to replace advice given to you by your health care provider. Make sure you discuss any questions you have with your health care provider. Document Released: 09/02/2004 Document Revised: 01/01/2016 Document Reviewed: 01/26/2013 Elsevier  Interactive Patient Education  2017 Elsevier Inc.     Preventing Cerebrovascular Disease Arteries are blood vessels that carry blood that contains oxygen from the heart to all parts of the body. Cerebrovascular disease affects arteries that supply the brain. Any condition that blocks or disrupts blood flow to the brain can cause cerebrovascular disease. Brain cells that lose blood supply start to die within minutes (stroke). Stroke is the main danger of cerebrovascular disease. Atherosclerosis and high blood pressure are common causes of cerebrovascular disease. Atherosclerosis is narrowing and hardening of an artery that results when fat, cholesterol, calcium, or other substances (plaque) build up inside an artery. Plaque reduces blood flow through the artery. High blood pressure increases the risk of bleeding inside the brain. Making diet and lifestyle changes to prevent atherosclerosis and high blood pressure lowers your risk of cerebrovascular disease. What nutrition changes can be made?  Eat more fruits, vegetables, and whole grains.  Reduce how much saturated fat you eat. To do this, eat less red meat and fewer full-fat dairy products.  Eat healthy proteins instead of red meat. Healthy proteins include: ? Fish. Eat fish that contains heart-healthy omega-3 fatty acids, twice a week. Examples include salmon, albacore tuna, mackerel, and herring. ? Chicken. ? Nuts. ? Low-fat or nonfat yogurt.  Avoid processed meats, like bacon and lunchmeat.  Avoid foods that contain: ? A lot of sugar, such as sweets and drinks with added sugar. ? A lot of salt (sodium). Avoid adding extra salt to your food, as told by your health care provider. ? Trans fats, such as margarine and baked goods. Trans fats may be listed as "partially hydrogenated oils" on food labels.  Check food labels to see how much sodium, sugar, and trans fats are in foods.  Use vegetable oils that contain low amounts of  saturated fat, such as olive oil or canola oil. What lifestyle changes can be made?  Drink alcohol in moderation. This means no more than 1 drink a day for nonpregnant women and 2 drinks a day for men. One drink equals 12 oz of beer, 5 oz of wine, or 1 oz of hard liquor.  If you are overweight, ask your health care provider to recommend a weight-loss plan for you. Losing 5-10 lb (2.2-4.5 kg) can reduce your risk of diabetes, atherosclerosis, and high blood pressure.  Exercise for 30?60 minutes on most days, or as much as told by your health care provider. ? Do moderate-intensity exercise, such as brisk walking, bicycling, and water aerobics. Ask your health care provider which activities are safe for you.  Do not use any products that contain nicotine or tobacco, such as cigarettes and e-cigarettes. If you need help quitting, ask your health care provider. Why are these changes important? Making these changes lowers your risk of many diseases that can cause cerebrovascular disease and stroke. Stroke is a leading cause of death and disability. Making these changes also improves your overall health and quality of life. What can I do to lower my risk? The following factors make you more likely to develop cerebrovascular disease:  Being overweight.  Smoking.  Being physically inactive.    Eating a high-fat diet.  Having certain health conditions, such as: ? Diabetes. ? High blood pressure. ? Heart disease. ? Atherosclerosis. ? High cholesterol. ? Sickle cell disease.  Talk with your health care provider about your risk for cerebrovascular disease. Work with your health care provider to control diseases that you have that may contribute to cerebrovascular disease. Your health care provider may prescribe medicines to help prevent major causes of cerebrovascular disease. Where to find more information: Learn more about preventing cerebrovascular disease from:  National Heart, Lung, and  Blood Institute: www.nhlbi.nih.gov/health/health-topics/topics/stroke  Centers for Disease Control and Prevention: cdc.gov/stroke/about.htm  Summary  Cerebrovascular disease can lead to a stroke.  Atherosclerosis and high blood pressure are major causes of cerebrovascular disease.  Making diet and lifestyle changes can reduce your risk of cerebrovascular disease.  Work with your health care provider to get your risk factors under control to reduce your risk of cerebrovascular disease. This information is not intended to replace advice given to you by your health care provider. Make sure you discuss any questions you have with your health care provider. Document Released: 08/10/2015 Document Revised: 02/13/2016 Document Reviewed: 08/10/2015 Elsevier Interactive Patient Education  2018 Elsevier Inc.  

## 2017-03-01 DIAGNOSIS — N182 Chronic kidney disease, stage 2 (mild): Secondary | ICD-10-CM | POA: Diagnosis not present

## 2017-03-01 DIAGNOSIS — I10 Essential (primary) hypertension: Secondary | ICD-10-CM | POA: Diagnosis not present

## 2017-03-01 DIAGNOSIS — E1129 Type 2 diabetes mellitus with other diabetic kidney complication: Secondary | ICD-10-CM | POA: Diagnosis not present

## 2017-03-01 DIAGNOSIS — Z6835 Body mass index (BMI) 35.0-35.9, adult: Secondary | ICD-10-CM | POA: Diagnosis not present

## 2017-03-02 ENCOUNTER — Telehealth: Payer: Self-pay | Admitting: Cardiology

## 2017-03-02 NOTE — Telephone Encounter (Signed)
New Message  Pt call requesting to speak with RN about some health changes he is experiencing. Pt states he has been taking his Nitroglycerin more often in the pm than he usually would and would like to discuss with RN. Pt has an appt scheduled with L. Ingold  For 8/3. Please call back to discuss

## 2017-03-02 NOTE — Telephone Encounter (Signed)
Pt states he use to take his Nitro on rare occasions for SOB and it would help.  He has been having to take it more often and this is concerning him.  States he still has occasional SOB and sometimes he wakes up in the middle of the night with what feels like indigestion.  States he takes an antiacid and if that doesn't help, he takes a Nitro and then it goes away.  Currently takes Nitro 1-3x/week.  States this seems to occur most often right after he eats something heavy like beef.  BP is normally 110-120/60-80.  Denies lightheadedness.  States he always has issues with slight dizziness d/t vertigo and issues with vision that he has spoken to his eye doctor about.  These have not changed since he started taking Nitro more often.  Pt still able to exercise without issues.  Pt scheduled to see Cecilie Kicks, NP on 03/11/17.  Will route to Dr. Marlou Porch for review and advisement.

## 2017-03-03 ENCOUNTER — Telehealth: Payer: Self-pay | Admitting: Cardiology

## 2017-03-03 MED ORDER — METOPROLOL TARTRATE 50 MG PO TABS: 50.0000 mg | ORAL_TABLET | Freq: Two times a day (BID) | ORAL | 3 refills | Status: DC

## 2017-03-03 NOTE — Telephone Encounter (Signed)
Pt was called with instructions and it wasn't clear, would like clarification-pls call [phone]

## 2017-03-03 NOTE — Telephone Encounter (Signed)
Let's try increasing metoprolol to 50mg  PO BID (anti-anginal). Continue with imdur 60 QD Recent cath reviewed, medical mgt.  Candee Furbish, MD

## 2017-03-03 NOTE — Telephone Encounter (Signed)
Pt unclear on second portion of earlier message reiterated to cont to take  imdur (isosorbide ) .  Pt understands .Seth Young

## 2017-03-03 NOTE — Telephone Encounter (Signed)
Pt is aware that the last cardiac cath was reviewed, results to be medical managed.also pt is aware that MD recommends to increased metoprolol to 50 mg twice a day and to continue taking the Imdur 60 mg once daily. Metoprolol 50 mg prescription was send electronically to [facility] in [place]. Pt is aware.

## 2017-03-08 NOTE — Addendum Note (Signed)
Addended by: Lianne Cure A on: 03/08/2017 04:42 PM   Modules accepted: Orders

## 2017-03-11 ENCOUNTER — Ambulatory Visit (INDEPENDENT_AMBULATORY_CARE_PROVIDER_SITE_OTHER): Payer: Medicare Other | Admitting: Cardiology

## 2017-03-11 ENCOUNTER — Encounter: Payer: Self-pay | Admitting: Cardiology

## 2017-03-11 VITALS — BP 124/72 | HR 73 | Ht 71.5 in | Wt 252.0 lb

## 2017-03-11 DIAGNOSIS — I2583 Coronary atherosclerosis due to lipid rich plaque: Secondary | ICD-10-CM | POA: Diagnosis not present

## 2017-03-11 DIAGNOSIS — Z794 Long term (current) use of insulin: Secondary | ICD-10-CM | POA: Diagnosis not present

## 2017-03-11 DIAGNOSIS — I2510 Atherosclerotic heart disease of native coronary artery without angina pectoris: Secondary | ICD-10-CM

## 2017-03-11 DIAGNOSIS — E7800 Pure hypercholesterolemia, unspecified: Secondary | ICD-10-CM | POA: Diagnosis not present

## 2017-03-11 DIAGNOSIS — IMO0001 Reserved for inherently not codable concepts without codable children: Secondary | ICD-10-CM

## 2017-03-11 DIAGNOSIS — K219 Gastro-esophageal reflux disease without esophagitis: Secondary | ICD-10-CM

## 2017-03-11 DIAGNOSIS — E119 Type 2 diabetes mellitus without complications: Secondary | ICD-10-CM

## 2017-03-11 DIAGNOSIS — R079 Chest pain, unspecified: Secondary | ICD-10-CM

## 2017-03-11 DIAGNOSIS — R0789 Other chest pain: Secondary | ICD-10-CM

## 2017-03-11 MED ORDER — PANTOPRAZOLE SODIUM 40 MG PO TBEC: 40.0000 mg | DELAYED_RELEASE_TABLET | Freq: Two times a day (BID) | ORAL | 2 refills | Status: DC

## 2017-03-11 MED ORDER — ISOSORBIDE MONONITRATE ER 60 MG PO TB24: 90.0000 mg | ORAL_TABLET | Freq: Every day | ORAL | 2 refills | Status: DC

## 2017-03-11 NOTE — Progress Notes (Signed)
Cardiology Office Note   Date:  03/11/2017   ID:  FRISCO CORDTS, DOB 1941-04-29, MRN [id number]  PCP:  Burnard Bunting, MD  Cardiologist:  Dr. Marlou Porch     Chief Complaint  Patient presents with  . Chest Pain      History of Present Illness: JADIEL SCHMIEDER is a 76 y.o. male who presents for angina and taking NTG 1-3 X a week.  Freq. This is after meals.   Dr. Marlou Porch increased the metoprol to 50 BID and continue imdur.   He has a hx. Of  CAD with last cath 2015.  Hx of stent to 1st OM that was patent in 2015.  Diffuse nonobstructive LAD and LCX stenoses.  Severe RCA stenosis involving the distal vessel and PDA branches.  Dr Burt Knack did not think the rt PDA was graftable or treatable with PCI.  Medical therapy.   Then last year in [place]. Patent stent to OM, otherwise no changes.     He has carotid disease with Lt. CEA . Followed by Dr. Bridgett Larsson.  RICA < 50% stenosis.   Today he tells me that having CAD really shocked him.  We discussed that he does have disease but the stent was open and the LAD was stable. But the right was with diffuse disease and that may be increasing and causing his pain.  At times the chest pressure occurs after dinner and NTG does improve.  Also has occurred at night and NTG has improved.  Today just walking across parking area he had mild chest pressure and mild SOB.  He believes he is some better with increase of the metoprolol.  EKG is reassuring.    Past Medical History:  Diagnosis Date  . Arthritis    "knees, toes, hands" (06/26/2014)  . Ataxia   . Basal cell carcinoma    left temporal area-no residual  . Colon polyps 10-12-11   past hx.  . Exertional angina ([place]) 06/26/2014   Cath with DES to the OM, Bioflow protocol   . GERD (gastroesophageal reflux disease)   . H/O hiatal hernia    no problems  . High cholesterol   . Hypertension   . Ischemic chest pain 08/07/2014  . Occasional tremors    Bilateral hands  . Peripheral neuropathy   . Pneumonia 1940's  X 2  . Pseudogout    "it moves around"  . Sleep apnea    no cpap used  . Type II diabetes mellitus ([place]) dx'd 1982   nsulin started ~ 2000  . Vertigo     Past Surgical History:  Procedure Laterality Date  . BACK SURGERY    . BASAL CELL CARCINOMA EXCISION Left ~ 2012   face  . CARDIAC CATHETERIZATION  02/2005   Dr. Mare Ferrari; negative.  Marland Kitchen CARDIAC CATHETERIZATION N/A 05/13/2016   Procedure: Left Heart Cath and Coronary Angiography;  Surgeon: Jerline Pain, MD;  Location: [place] [facility];  Service: Cardiovascular;  Laterality: N/A;  . CAROTID ENDARTERECTOMY Left Ssept. 14, 2016   CEA  . CARPAL TUNNEL RELEASE Left 10/2009  . CARPAL TUNNEL RELEASE Right ~ 2013  . CATARACT EXTRACTION W/ INTRAOCULAR LENS IMPLANT Left 2000's  . CATARACT EXTRACTION W/ INTRAOCULAR LENS IMPLANT Right 2015  . COLONOSCOPY W/ POLYPECTOMY    . CORONARY ANGIOPLASTY WITH STENT PLACEMENT  06/26/2014   "1"  . CORONARY ANGIOPLASTY WITH STENT PLACEMENT  06/26/2014   pLAD 50%, d LAD 60%, oD1 80%,  mD1  70%, CFX  50%, OM 2 70%,  RCA 80%, PDA 95% (<93mm), OM1 99%-0% with Bio flow stent       . ELBOW SURGERY Bilateral ~ 2014   "for blockages; Dr. Amedeo Plenty"  . ENDARTERECTOMY Left 04/23/2015   Procedure: ENDARTERECTOMY CAROTID;  Surgeon: Conrad Oklee, MD;  Location: [place];  Service: Vascular;  Laterality: Left;  . KNEE ARTHROSCOPY Left 05/2011   10'12-left knee  . LEFT HEART CATHETERIZATION WITH CORONARY ANGIOGRAM N/A 06/26/2014   Procedure: LEFT HEART CATHETERIZATION WITH CORONARY ANGIOGRAM;  Surgeon: Blane Ohara, MD;  Location: [facility];  Service: Cardiovascular;  Laterality: N/A;  . LEFT HEART CATHETERIZATION WITH CORONARY ANGIOGRAM N/A 08/07/2014   Procedure: LEFT HEART CATHETERIZATION WITH CORONARY ANGIOGRAM;  Surgeon: Blane Ohara, MD;  Location: [facility];  Service: Cardiovascular;  Laterality: N/A;  . NASAL SEPTUM SURGERY  1979  . PATCH ANGIOPLASTY Left 04/23/2015   Procedure: PATCH ANGIOPLASTY USING  1CM X 6CM XENOSURE BIOLOGIC PATCH;  Surgeon: Conrad Cedarville, MD;  Location: [place];  Service: Vascular;  Laterality: Left;  . [place]  . SHOULDER OPEN ROTATOR CUFF REPAIR  10/14/2011   Procedure: ROTATOR CUFF REPAIR SHOULDER OPEN;  Surgeon: Johnn Hai, MD;  Location: [facility];  Service: Orthopedics;  Laterality: Right;  . SUBACROMIAL DECOMPRESSION  10/14/2011   Procedure: SUBACROMIAL DECOMPRESSION;  Surgeon: Johnn Hai, MD;  Location: [facility];  Service: Orthopedics;  Laterality: Right;  . TRIGGER FINGER RELEASE Right ~ 2013-2014   "3rd & 4th digits"     Current Outpatient Prescriptions  Medication Sig Dispense Refill  . acetaminophen (TYLENOL) 325 MG tablet Take 650 mg by mouth at bedtime as needed (sleep).    Marland Kitchen aspirin 81 MG tablet Take 1 tablet (81 mg total) by mouth daily.    Marland Kitchen atorvastatin (LIPITOR) 40 MG tablet TAKE ONE TABLET BY MOUTH ONCE DAILY BEFORE  BREAKFAST 90 tablet 1  . clopidogrel (PLAVIX) 75 MG tablet TAKE ONE TABLET BY MOUTH ONCE DAILY 90 tablet 0  . colchicine 0.6 MG tablet Take 0.6 mg by mouth 2 (two) times daily as needed (gout).     Marland Kitchen diphenhydrAMINE (BENADRYL) 25 MG tablet Take 25 mg by mouth at bedtime as needed for sleep.    . dorzolamide-timolol (COSOPT) 22.3-6.8 MG/ML ophthalmic solution Place 1 drop into both eyes 2 (two) times daily.    . empagliflozin (JARDIANCE) 25 MG TABS tablet Take 25 mg by mouth daily.    . insulin glargine (LANTUS) 100 UNIT/ML injection Inject 80 Units into the skin at bedtime.     . insulin lispro (HUMALOG) 100 UNIT/ML injection Inject 8 Units into the skin 3 (three) times daily with meals. Sliding scale       . Insulin Pen Needle (FIFTY50 PEN NEEDLES) 31G X 8 MM MISC     . isosorbide mononitrate (IMDUR) 60 MG 24 hr tablet Take 1.5 tablets (90 mg total) by mouth daily. 135 tablet 2  . lisinopril (PRINIVIL,ZESTRIL) 10 MG tablet TAKE ONE TABLET BY MOUTH ONCE DAILY BEFORE BREAKFAST 90 tablet 0  . LYRICA 100 MG capsule  Take 1 capsule by mouth 3 (three) times daily as needed. For neuropathic pain  2  . metFORMIN (GLUCOPHAGE) 1000 MG tablet TAKE ONE TABLET BY MOUTH TWICE DAILY WITH A MEAL 60 tablet 0  . metoprolol tartrate (LOPRESSOR) 50 MG tablet Take 1 tablet (50 mg total) by mouth 2 (two) times daily. 180 tablet 3  . Multiple Vitamin (  MULTIVITAMIN) tablet Take 1 tablet by mouth daily.    . nitroGLYCERIN (NITROSTAT) 0.4 MG SL tablet Place 1 tablet (0.4 mg total) under the tongue every 5 (five) minutes as needed for chest pain. 25 tablet 3  . Omega-3 Fatty Acids (FISH OIL) 600 MG CAPS Take 600 mg by mouth daily.    . Probiotic Product (PROBIOTIC PO) Take 1 tablet by mouth daily.    . pantoprazole (PROTONIX) 40 MG tablet Take 1 tablet (40 mg total) by mouth 2 (two) times daily. 180 tablet 2   No current facility-administered medications for this visit.     Allergies:   Gabapentin; Morphine; Morphine and related; and Naproxen    Social History:  The patient  reports that he quit smoking about 28 years ago. His smoking use included Cigarettes. He has a 10.00 pack-year smoking history. He has never used smokeless tobacco. He reports that he does not drink alcohol or use drugs.   Family History:  The patient's family history includes Cancer in his sister; Diabetes in his brother, mother, and sister; Heart attack (age of onset: 56) in his father; Hypertension in his brother and sister; Varicose Veins in his mother and sister.    ROS:  General:no colds or fevers, no weight changes Skin:no rashes or ulcers HEENT:no blurred vision, no congestion CV:see HPI PUL:see HPI GI:no diarrhea constipation or melena, no indigestion GU:no hematuria, no dysuria MS:no joint pain, no claudication Neuro:no syncope, no lightheadedness-hx vertigo, + neuropathy Endo:+ diabetes , no thyroid disease  Wt Readings from Last 3 Encounters:  03/11/17 252 lb (114.3 kg)  02/23/17 251 lb (113.9 kg)  05/13/16 250 lb (113.4 kg)      PHYSICAL EXAM: VS:  BP 124/72   Pulse 73   Ht 5' 11.5" (1.816 m)   Wt 252 lb (114.3 kg)   SpO2 99%   BMI 34.66 kg/m  , BMI Body mass index is 34.66 kg/m. General:Pleasant affect, NAD Skin:Warm and dry, brisk capillary refill HEENT:normocephalic, sclera clear, mucus membranes moist Neck:supple, no JVD, no bruits  Heart:S1S2 RRR without murmur, gallup, rub or click Lungs:clear without rales, rhonchi, or wheezes IWP:YKDX, non tender, + BS, do not palpate liver spleen or masses Ext:no lower ext edema, 2+ pedal pulses, 2+ radial pulses Neuro:alert and oriented x 3, MAE, follows commands, + facial symmetry    EKG:  EKG is ordered today. The ekg ordered today demonstrates SR 1st degree AV block no change from 05/2016    Recent Labs: 05/07/2016: BUN 17; Creat 1.17; Hemoglobin 13.8; Platelets 204; Potassium 4.7; Sodium 136    Lipid Panel No results found for: CHOL, TRIG, HDL, CHOLHDL, VLDL, LDLCALC, LDLDIRECT     Other studies Reviewed: Additional studies/ records that were reviewed today include: . Cath 05/13/2016 Left Heart Cath and Coronary Angiography  Conclusion     The left ventricular systolic function is normal. EF 83%  LV end diastolic pressure is normal. 11 mmHg  There is no aortic valve stenosis.  There is no mitral valve stenosis and no mitral valve prolapse evident.  The previously placed obtuse marginal stent was widely patent.  Right coronary artery disease was diffuse and similar to prior cardiac catheterization in 2016 with the exception of increased stenosis in the small branch (80%) of the PDA as well as distal segment of small acute marginal branch. There still persists mid RCA disease of up to 60%.   Films carefully reviewed and discussed with Dr. Glenetta Hew of interventional cardiology. We will  continue with aggressive medical management, I increased his isosorbide to 60 mg. If his anginal symptoms escalate, one could consider balloon  angioplasty of the PDA segment however this vessel is quite small in caliber likely 1.5 mm vessel. The durability of such procedure may be low. His symptoms also consist of chest burning similar to GERD-like symptoms however these were the same symptoms he felt prior to his circumflex stent. The main objective of this diagnostic cardiac catheterization, to exclude advancement to triple-vessel coronary artery disease was reassuring.    Echo 07/20/17 Study Conclusions  - Left ventricle: The cavity size was normal. There was severe   focal basal and mild concentric hypertrophy of the left   ventricle. Systolic function was vigorous. The estimated ejection   fraction was in the range of 65% to 70%. Wall motion was normal;   there were no regional wall motion abnormalities. Doppler   parameters are consistent with abnormal left ventricular   relaxation (grade 1 diastolic dysfunction). Doppler parameters   are consistent with elevated ventricular end-diastolic filling   pressure. - Aortic valve: Trileaflet; normal thickness leaflets. There was   mild to moderate regurgitation. - Aortic root: The aortic root was normal in size. - Ascending aorta: The ascending aorta was normal in size. - Mitral valve: Structurally normal valve. There was trivial   regurgitation. - Left atrium: The atrium was normal in size. - Right ventricle: The cavity size was normal. Wall thickness was   normal. - Right atrium: The atrium was normal in size. - Tricuspid valve: There was mild regurgitation. - Pulmonic valve: There was no regurgitation. - Pulmonary arteries: Systolic pressure was within the normal   range. - Inferior vena cava: The vessel was normal in size. - Pericardium, extracardiac: There was no pericardial effusion.   ASSESSMENT AND PLAN:  1.  Chest pain- some does sound like GERD but some with exertion possible angina.  He believes increasing the BB may have helped.  With his RCA disease will  increase imdur to 90 mg daily and change his Prilosec to protonix 40 mg BID.  If symptoms increase he should go to the ER but otherwise I will see him back in 2 weeks.  We discussed nuc study but he does not trust those studies.  Will check with Dr. Marlou Porch for any other recommendations.  2.  CAD with hx stent to OM1 and RCA disease that is not been amenable to PCI or bypass.   BB just increased and now imdur.    3.  HLD followed by PCP  4.  GERD, changed Prilosec to protonix as pt has been on Prilosec for some time.   5. DM-2 on insulin per PCP well controlled.    Current medicines are reviewed with the patient today.  The patient Has no concerns regarding medicines.  The following changes have been made:  See above Labs/ tests ordered today include:see above  Disposition:   FU:  see above  Signed, Cecilie Kicks, NP  03/11/2017 2:48 PM    [facility] [place], [place], [address] [place], [place] Phone: [phone]; Fax: [phone]

## 2017-03-11 NOTE — Patient Instructions (Signed)
Medication Instructions:  Your physician has recommended you make the following change in your medication:  1. Discontinue Prilosec. 2. Start Protonix (40 mg) twice a day. 3. Increase Imdur, one and one half tablets (90 mg ) daily All medications sent in today to patient's requested pharmacy, [facility] [address].    Labwork: -None  Testing/Procedures: -None   Follow-Up: Your physician recommends that you keep your scheduled  follow-up appointment with Cecilie Kicks, NP in August. Your physician recommends that you keep your scheduled  follow-up appointment with Dr. Kingsley Plan in November.   Any Other Special Instructions Will Be Listed Below (If Applicable).     If you need a refill on your cardiac medications before your next appointment, please call your pharmacy.

## 2017-03-14 DIAGNOSIS — M9905 Segmental and somatic dysfunction of pelvic region: Secondary | ICD-10-CM | POA: Diagnosis not present

## 2017-03-14 DIAGNOSIS — M9903 Segmental and somatic dysfunction of lumbar region: Secondary | ICD-10-CM | POA: Diagnosis not present

## 2017-03-14 DIAGNOSIS — M9904 Segmental and somatic dysfunction of sacral region: Secondary | ICD-10-CM | POA: Diagnosis not present

## 2017-03-14 DIAGNOSIS — M5136 Other intervertebral disc degeneration, lumbar region: Secondary | ICD-10-CM | POA: Diagnosis not present

## 2017-03-16 ENCOUNTER — Telehealth: Payer: Self-pay

## 2017-03-16 NOTE — Telephone Encounter (Signed)
Late entry from 1649 (patient called during EPIC outage).    Patient called to report he drove to [facility] today and when he got out of the car to stand up, he got a slight "cramping" above his sternum on the R side.  The cramping is relieved with rest and relaxation. When he is up and moving around, the cramping returns. He said it almost feels like the "cramping is in the bone." He states it feels like he moved the wrong way and maybe pulled something. He took one SL NTG tablet and didn't get relief. He had no other symptoms. He denies SOB, nausea, sweating.  He has been taking his medications as directed. He reports he feels better after his appointment with L. Dorene Ar, NP and increasing his Imdur. Patient states he is in no acute distress and believes he may have pulled a muscle. Instructed patient to locate an Urgent Care or medical center in case symptoms worsen or other symptoms occur.  He will call when he returns for an update. He was grateful for call.

## 2017-03-19 DIAGNOSIS — E1129 Type 2 diabetes mellitus with other diabetic kidney complication: Secondary | ICD-10-CM | POA: Diagnosis not present

## 2017-03-29 ENCOUNTER — Encounter: Payer: Self-pay | Admitting: Cardiology

## 2017-03-29 ENCOUNTER — Ambulatory Visit (INDEPENDENT_AMBULATORY_CARE_PROVIDER_SITE_OTHER): Payer: Medicare Other | Admitting: Cardiology

## 2017-03-29 VITALS — BP 98/54 | HR 56 | Ht 71.5 in | Wt 255.4 lb

## 2017-03-29 DIAGNOSIS — I2583 Coronary atherosclerosis due to lipid rich plaque: Secondary | ICD-10-CM

## 2017-03-29 DIAGNOSIS — I10 Essential (primary) hypertension: Secondary | ICD-10-CM | POA: Diagnosis not present

## 2017-03-29 DIAGNOSIS — R079 Chest pain, unspecified: Secondary | ICD-10-CM

## 2017-03-29 DIAGNOSIS — I2510 Atherosclerotic heart disease of native coronary artery without angina pectoris: Secondary | ICD-10-CM | POA: Diagnosis not present

## 2017-03-29 DIAGNOSIS — IMO0001 Reserved for inherently not codable concepts without codable children: Secondary | ICD-10-CM

## 2017-03-29 DIAGNOSIS — E7800 Pure hypercholesterolemia, unspecified: Secondary | ICD-10-CM

## 2017-03-29 DIAGNOSIS — E119 Type 2 diabetes mellitus without complications: Secondary | ICD-10-CM | POA: Diagnosis not present

## 2017-03-29 DIAGNOSIS — R0789 Other chest pain: Secondary | ICD-10-CM

## 2017-03-29 DIAGNOSIS — K219 Gastro-esophageal reflux disease without esophagitis: Secondary | ICD-10-CM | POA: Diagnosis not present

## 2017-03-29 DIAGNOSIS — Z794 Long term (current) use of insulin: Secondary | ICD-10-CM

## 2017-03-29 NOTE — Patient Instructions (Signed)
Medication Instructions:  None ordered  Labwork: None ordered  Testing/Procedures: None ordered  Follow-Up: Your physician recommends that you schedule a follow-up appointment in: [place]   Any Other Special Instructions Will Be Listed Below (If Applicable). Check your blood pressure at least 1 X a week, if it continues to run lower than 100 on the top, or if you have worsened chest pain, call our office.   If you need a refill on your cardiac medications before your next appointment, please call your pharmacy.

## 2017-03-29 NOTE — Progress Notes (Signed)
Cardiology Office Note   Date:  03/29/2017   ID:  Seth Young, Seth Young 04/26/1941, MRN [id number]  PCP:  Burnard Bunting, MD  Cardiologist:   Dr. Marlou Porch   Chief Complaint  Patient presents with  . Chest Pain      History of Present Illness: Seth Young is a 76 y.o. male who presents for angina/GI symptoms and follow up after med changes.   He was taking NTG 1-3 X a week but now with increase of Imdur to 90 mg and metoprolol to 50 mg BID he has only used NTG once.  We also changed priolosec to protonix BID    He has a hx. Of  CAD with last cath 2015.  Hx of stent to 1st OM that was patent in 2015.  Diffuse nonobstructive LAD and LCX stenoses.  Severe RCA stenosis involving the distal vessel and PDA branches.  Dr Burt Knack did not think the rt PDA was graftable or treatable with PCI.  Medical therapy.   Then last year in [place]. Patent stent to OM, otherwise no changes with cath.   Plan was medical therapy.  He does not trust nuc. Studies.   He has carotid disease with Lt. CEA . Followed by Dr. Bridgett Larsson.  RICA < 50% stenosis.   Today he is feeling better.  His BP is lower but no significant dizziness, he has hx of vertigo.  We discussed if he needs to use more NTG to let us know and at that time we would recath if needed.  He is agreeable.      Past Medical History:  Diagnosis Date  . Arthritis    "knees, toes, hands" (06/26/2014)  . Ataxia   . Basal cell carcinoma    left temporal area-no residual  . Colon polyps 10-12-11   past hx.  . Exertional angina ([place]) 06/26/2014   Cath with DES to the OM, Bioflow protocol   . GERD (gastroesophageal reflux disease)   . H/O hiatal hernia    no problems  . High cholesterol   . Hypertension   . Ischemic chest pain 08/07/2014  . Occasional tremors    Bilateral hands  . Peripheral neuropathy   . Pneumonia 1940's X 2  . Pseudogout    "it moves around"  . Sleep apnea    no cpap used  . Type II diabetes mellitus ([place]) dx'd 1982   nsulin started ~ 2000  . Vertigo     Past Surgical History:  Procedure Laterality Date  . BACK SURGERY    . BASAL CELL CARCINOMA EXCISION Left ~ 2012   face  . CARDIAC CATHETERIZATION  02/2005   Dr. Mare Ferrari; negative.  Marland Kitchen CARDIAC CATHETERIZATION N/A 05/13/2016   Procedure: Left Heart Cath and Coronary Angiography;  Surgeon: Jerline Pain, MD;  Location: [facility];  Service: Cardiovascular;  Laterality: N/A;  . CAROTID ENDARTERECTOMY Left Ssept. 14, 2016   CEA  . CARPAL TUNNEL RELEASE Left 10/2009  . CARPAL TUNNEL RELEASE Right ~ 2013  . CATARACT EXTRACTION W/ INTRAOCULAR LENS IMPLANT Left 2000's  . CATARACT EXTRACTION W/ INTRAOCULAR LENS IMPLANT Right 2015  . COLONOSCOPY W/ POLYPECTOMY    . CORONARY ANGIOPLASTY WITH STENT PLACEMENT  06/26/2014   "1"  . CORONARY ANGIOPLASTY WITH STENT PLACEMENT  06/26/2014   pLAD 50%, d LAD 60%, oD1 80%,  mD1  70%, CFX 50%, OM 2 70%,  RCA 80%, PDA 95% (<34mm), OM1 99%-0% with Bio flow stent       .  ELBOW SURGERY Bilateral ~ 2014   "for blockages; Dr. Amedeo Plenty"  . ENDARTERECTOMY Left 04/23/2015   Procedure: ENDARTERECTOMY CAROTID;  Surgeon: Conrad [place], MD;  Location: [facility];  Service: Vascular;  Laterality: Left;  . KNEE ARTHROSCOPY Left 05/2011   10'12-left knee  . LEFT HEART CATHETERIZATION WITH CORONARY ANGIOGRAM N/A 06/26/2014   Procedure: LEFT HEART CATHETERIZATION WITH CORONARY ANGIOGRAM;  Surgeon: Blane Ohara, MD;  Location: [facility];  Service: Cardiovascular;  Laterality: N/A;  . LEFT HEART CATHETERIZATION WITH CORONARY ANGIOGRAM N/A 08/07/2014   Procedure: LEFT HEART CATHETERIZATION WITH CORONARY ANGIOGRAM;  Surgeon: Blane Ohara, MD;  Location: [facility] CATH LAB;  Service: Cardiovascular;  Laterality: N/A;  . NASAL SEPTUM SURGERY  1979  . PATCH ANGIOPLASTY Left 04/23/2015   Procedure: PATCH ANGIOPLASTY USING 1CM X 6CM XENOSURE BIOLOGIC PATCH;  Surgeon: Conrad [place], MD;  Location: [facility];  Service: Vascular;  Laterality: Left;  .  [place]  . SHOULDER OPEN ROTATOR CUFF REPAIR  10/14/2011   Procedure: ROTATOR CUFF REPAIR SHOULDER OPEN;  Surgeon: Johnn Hai, MD;  Location: [facility];  Service: Orthopedics;  Laterality: Right;  . SUBACROMIAL DECOMPRESSION  10/14/2011   Procedure: SUBACROMIAL DECOMPRESSION;  Surgeon: Johnn Hai, MD;  Location: [facility];  Service: Orthopedics;  Laterality: Right;  . TRIGGER FINGER RELEASE Right ~ 2013-2014   "3rd & 4th digits"     Current Outpatient Prescriptions  Medication Sig Dispense Refill  . acetaminophen (TYLENOL) 325 MG tablet Take 650 mg by mouth at bedtime as needed (sleep).    Marland Kitchen aspirin 81 MG tablet Take 1 tablet (81 mg total) by mouth daily.    Marland Kitchen atorvastatin (LIPITOR) 40 MG tablet TAKE ONE TABLET BY MOUTH ONCE DAILY BEFORE  BREAKFAST 90 tablet 1  . clopidogrel (PLAVIX) 75 MG tablet TAKE ONE TABLET BY MOUTH ONCE DAILY 90 tablet 0  . colchicine 0.6 MG tablet Take 0.6 mg by mouth 2 (two) times daily as needed (gout).     Marland Kitchen diphenhydrAMINE (BENADRYL) 25 MG tablet Take 25 mg by mouth at bedtime as needed for sleep.    . dorzolamide-timolol (COSOPT) 22.3-6.8 MG/ML ophthalmic solution Place 1 drop into both eyes 2 (two) times daily.    . empagliflozin (JARDIANCE) 25 MG TABS tablet Take 25 mg by mouth daily.    Marland Kitchen HUMALOG KWIKPEN 200 UNIT/ML SOPN Inject 8 Units as directed 3 (three) times daily. SLIDING SCALE  1  . insulin glargine (LANTUS) 100 UNIT/ML injection Inject 80 Units into the skin at bedtime.     . Insulin Pen Needle (FIFTY50 PEN NEEDLES) 31G X 8 MM MISC     . isosorbide mononitrate (IMDUR) 60 MG 24 hr tablet Take 1.5 tablets (90 mg total) by mouth daily. 135 tablet 2  . lisinopril (PRINIVIL,ZESTRIL) 10 MG tablet TAKE ONE TABLET BY MOUTH ONCE DAILY BEFORE BREAKFAST 90 tablet 0  . LYRICA 100 MG capsule Take 1 capsule by mouth 3 (three) times daily as needed. For neuropathic pain  2  . metFORMIN (GLUCOPHAGE) 1000 MG tablet TAKE ONE TABLET BY MOUTH TWICE  DAILY WITH A MEAL 60 tablet 0  . metoprolol tartrate (LOPRESSOR) 50 MG tablet Take 1 tablet (50 mg total) by mouth 2 (two) times daily. 180 tablet 3  . Multiple Vitamin (MULTIVITAMIN) tablet Take 1 tablet by mouth daily.    . nitroGLYCERIN (NITROSTAT) 0.4 MG SL tablet Place 1 tablet (0.4 mg total) under the tongue every 5 (five)  minutes as needed for chest pain. 25 tablet 3  . Omega-3 Fatty Acids (FISH OIL) 600 MG CAPS Take 600 mg by mouth daily.    . pantoprazole (PROTONIX) 40 MG tablet Take 1 tablet (40 mg total) by mouth 2 (two) times daily. 180 tablet 2  . Probiotic Product (PROBIOTIC PO) Take 1 tablet by mouth daily.     No current facility-administered medications for this visit.     Allergies:   Gabapentin; Morphine; Morphine and related; and Naproxen    Social History:  The patient  reports that he quit smoking about 28 years ago. His smoking use included Cigarettes. He has a 10.00 pack-year smoking history. He has never used smokeless tobacco. He reports that he does not drink alcohol or use drugs.   Family History:  The patient's family history includes Cancer in his sister; Diabetes in his brother, mother, and sister; Heart attack (age of onset: 33) in his father; Hypertension in his brother and sister; Varicose Veins in his mother and sister.    ROS:  General:no colds or fevers, no weight changes Skin:no rashes or ulcers HEENT:occ blurred vision with new diabetes meds., no congestion CV:see HPI PUL:see HPI GI:no diarrhea constipation or melena, no indigestion GU:no hematuria, no dysuria MS:no joint pain, no claudication Neuro:no syncope, occ lightheadedness Endo:+ diabetes, no thyroid disease  Wt Readings from Last 3 Encounters:  03/29/17 255 lb 6.4 oz (115.8 kg)  03/11/17 252 lb (114.3 kg)  02/23/17 251 lb (113.9 kg)     PHYSICAL EXAM: VS:  BP (!) 98/54   Pulse (!) 56   Ht 5' 11.5" (1.816 m)   Wt 255 lb 6.4 oz (115.8 kg)   SpO2 98%   BMI 35.12 kg/m  , BMI Body  mass index is 35.12 kg/m. General:Pleasant affect, NAD Skin:Warm and dry, brisk capillary refill HEENT:normocephalic, sclera clear, mucus membranes moist Neck:supple, no JVD, no bruits  Heart:S1S2 RRR without murmur, gallup, rub or click Lungs:clear without rales, rhonchi, or wheezes XFG:HWEX, non tender, + BS, do not palpate liver spleen or masses Ext:no lower ext edema, 2+ pedal pulses, 2+ radial pulses Neuro:alert and oriented X 3, MAE, follows commands, + facial symmetry    EKG:  EKG is NOT ordered today.  Recent Labs: 05/07/2016: BUN 17; Creat 1.17; Hemoglobin 13.8; Platelets 204; Potassium 4.7; Sodium 136    Lipid Panel No results found for: CHOL, TRIG, HDL, CHOLHDL, VLDL, LDLCALC, LDLDIRECT     Other studies Reviewed: Additional studies/ records that were reviewed today include: . Cath 05/13/2016 Left Heart Cath and Coronary Angiography  Conclusion     The left ventricular systolic function is normal. EF 93%  LV end diastolic pressure is normal. 11 mmHg  There is no aortic valve stenosis.  There is no mitral valve stenosis and no mitral valve prolapse evident.  The previously placed obtuse marginal stent was widely patent.  Right coronary artery disease was diffuse and similar to prior cardiac catheterization in 2016 with the exception of increased stenosis in the small branch (80%) of the PDA as well as distal segment of small acute marginal branch. There still persists mid RCA disease of up to 60%.  Films carefully reviewed and discussed with Dr. Glenetta Hew of interventional cardiology. We will continue with aggressive medical management, I increased his isosorbide to 60 mg. If his anginal symptoms escalate, one could consider balloon angioplasty of the PDA segment however this vessel is quite small in caliber likely 1.5 mm vessel. The durability  of such procedure may be low. His symptoms also consist of chest burning similar to GERD-like symptoms however  these were the same symptoms he felt prior to his circumflex stent. The main objective of this diagnostic cardiac catheterization, to exclude advancement to triple-vessel coronary artery disease was reassuring.    Echo 07/20/17 Study Conclusions  - Left ventricle: The cavity size was normal. There was severe focal basal and mild concentric hypertrophy of the left ventricle. Systolic function was vigorous. The estimated ejection fraction was in the range of 65% to 70%. Wall motion was normal; there were no regional wall motion abnormalities. Doppler parameters are consistent with abnormal left ventricular relaxation (grade 1 diastolic dysfunction). Doppler parameters are consistent with elevated ventricular end-diastolic filling pressure. - Aortic valve: Trileaflet; normal thickness leaflets. There was mild to moderate regurgitation. - Aortic root: The aortic root was normal in size. - Ascending aorta: The ascending aorta was normal in size. - Mitral valve: Structurally normal valve. There was trivial regurgitation. - Left atrium: The atrium was normal in size. - Right ventricle: The cavity size was normal. Wall thickness was normal. - Right atrium: The atrium was normal in size. - Tricuspid valve: There was mild regurgitation. - Pulmonic valve: There was no regurgitation. - Pulmonary arteries: Systolic pressure was within the normal range. - Inferior vena cava: The vessel was normal in size. - Pericardium, extracardiac: There was no pericardial effusion.     ASSESSMENT AND PLAN:  1.  Angina improved with increased dose of BB and imdur.  May have GI factor as well with PPI to Protonix, but overall improved.  Keep follow up with Dr. Marlou Porch, though if increase in symptoms call to be seen  2. CAD recent angina see above hx stent to OM1 and RCA disease that is not been amenable to PCI or bypass.   3.  HLD followed by PCP continue statin  4.  GERD  with change of Prilosec to protonix  5.  DM-2 on insulin per pcp.   Current medicines are reviewed with the patient today.  The patient Has no concerns regarding medicines.  The following changes have been made:  See above Labs/ tests ordered today include:see above  Disposition:   FU:  see above  Signed, Cecilie Kicks, NP  03/29/2017 6:25 PM    [facility] [place], [place], [place], [place] Phone: [phone]; Fax: [phone]

## 2017-04-02 ENCOUNTER — Other Ambulatory Visit: Payer: Self-pay | Admitting: Cardiology

## 2017-04-06 ENCOUNTER — Telehealth: Payer: Self-pay | Admitting: Cardiology

## 2017-04-06 NOTE — Telephone Encounter (Signed)
New message  Pt c/o medication issue:  1. Name of Medication: isosorbide mononitrate (IMDUR) 60 MG 24 hr tablet  2. How are you currently taking this medication (dosage and times per day)? 60mg   3. Are you having a reaction (difficulty breathing--STAT)? Flare ups  4. What is your medication issue? Pt states that something is making his nose flare up and he thinks its the IMDUR and he wants to know about other options

## 2017-04-06 NOTE — Telephone Encounter (Signed)
Spoke with patient who is reporting that in the last couple of weeks he has noticed an increase in his neuropathy in his hands and feet.  He is concerned his s/s are r/t to increase in Isosorbide to 90 mg a day.  He states Mickel Baas told him if the increase in medication didn't help him there is something else to try.  He does report the increase in Isosorbide has helped his frequency in chest pain but thinks it is leading to increased neuropathy.  Advised I will forward this information to Cecilie Kicks, NP and someone will c/b with further instructions/orders.

## 2017-04-07 NOTE — Telephone Encounter (Signed)
We can try Ranexa 500 mg BID instead, though I have never seen increased neuropathy with imdur does not affect nerves

## 2017-04-07 NOTE — Telephone Encounter (Signed)
Pt aware no changes.  He will c/b should he start having increased chest pain.

## 2017-04-07 NOTE — Telephone Encounter (Signed)
Ok, hold off on Ranexa, though if increase of NTG use would begin.  Let's give the imdur longer at higher dose to resolve pain.

## 2017-04-07 NOTE — Telephone Encounter (Signed)
Pt states he has decided his neuropathy is r/t his Jardiance.  He is asking if he should start Ranexa and if so, does he need to discontinue the Isosorbide.  Advised I will clarify with Mickel Baas and call him back.  I reports only using 1 SL Ntg this week since Isosorbide was increased.

## 2017-04-21 DIAGNOSIS — M65312 Trigger thumb, left thumb: Secondary | ICD-10-CM | POA: Diagnosis not present

## 2017-04-21 DIAGNOSIS — M79645 Pain in left finger(s): Secondary | ICD-10-CM | POA: Diagnosis not present

## 2017-04-26 DIAGNOSIS — N182 Chronic kidney disease, stage 2 (mild): Secondary | ICD-10-CM | POA: Diagnosis not present

## 2017-04-26 DIAGNOSIS — Z23 Encounter for immunization: Secondary | ICD-10-CM | POA: Diagnosis not present

## 2017-04-26 DIAGNOSIS — E1129 Type 2 diabetes mellitus with other diabetic kidney complication: Secondary | ICD-10-CM | POA: Diagnosis not present

## 2017-04-26 DIAGNOSIS — Z6835 Body mass index (BMI) 35.0-35.9, adult: Secondary | ICD-10-CM | POA: Diagnosis not present

## 2017-04-26 DIAGNOSIS — I10 Essential (primary) hypertension: Secondary | ICD-10-CM | POA: Diagnosis not present

## 2017-05-04 DIAGNOSIS — H3582 Retinal ischemia: Secondary | ICD-10-CM | POA: Diagnosis not present

## 2017-05-04 DIAGNOSIS — H43811 Vitreous degeneration, right eye: Secondary | ICD-10-CM | POA: Diagnosis not present

## 2017-05-04 DIAGNOSIS — E113593 Type 2 diabetes mellitus with proliferative diabetic retinopathy without macular edema, bilateral: Secondary | ICD-10-CM | POA: Diagnosis not present

## 2017-05-04 DIAGNOSIS — H348322 Tributary (branch) retinal vein occlusion, left eye, stable: Secondary | ICD-10-CM | POA: Diagnosis not present

## 2017-05-19 DIAGNOSIS — H52223 Regular astigmatism, bilateral: Secondary | ICD-10-CM | POA: Diagnosis not present

## 2017-05-26 DIAGNOSIS — H26492 Other secondary cataract, left eye: Secondary | ICD-10-CM | POA: Diagnosis not present

## 2017-06-09 ENCOUNTER — Ambulatory Visit (INDEPENDENT_AMBULATORY_CARE_PROVIDER_SITE_OTHER): Payer: Medicare Other | Admitting: Cardiology

## 2017-06-09 ENCOUNTER — Encounter: Payer: Self-pay | Admitting: Cardiology

## 2017-06-09 VITALS — BP 124/70 | HR 72 | Ht 71.5 in | Wt 260.2 lb

## 2017-06-09 DIAGNOSIS — I2583 Coronary atherosclerosis due to lipid rich plaque: Secondary | ICD-10-CM

## 2017-06-09 DIAGNOSIS — I779 Disorder of arteries and arterioles, unspecified: Secondary | ICD-10-CM

## 2017-06-09 DIAGNOSIS — I2510 Atherosclerotic heart disease of native coronary artery without angina pectoris: Secondary | ICD-10-CM

## 2017-06-09 DIAGNOSIS — I209 Angina pectoris, unspecified: Secondary | ICD-10-CM | POA: Diagnosis not present

## 2017-06-09 DIAGNOSIS — I739 Peripheral vascular disease, unspecified: Secondary | ICD-10-CM

## 2017-06-09 NOTE — Progress Notes (Signed)
Cardiology Office Note    Date:  06/09/2017   ID:  ISAHIA HOLLERBACH, DOB [birth year], MRN [id number]  PCP:  Burnard Bunting, MD  Cardiologist:   Candee Furbish, MD     History of Present Illness:  Seth Young is a 76 y.o. male former patient of Dr. Sherryl Barters with diabetes, hypertension, hyperlipidemia, coronary artery disease here for follow-up  In 2015 he underwent heart catheterization after an abnormal nuclear stress test. Fairly diffuse disease. Normal ejection fraction 55%. Culprit vessel was felt to be the obtuse marginal 1. This was treated with PCI and DES. See some catheterization report summarized as below  Vertigo all summer 2017 - no CVA. Still with it. Cut back on exercise. Swim a little.   Felt a pressure in chest similar to GERD prior to stent. Dr. [name] ECG.  Watch diet. Pain certainly has GERD-like suggestion such as when laying down feels worse. He however is quite concerned especially with the increased shortness of breath with exercise. He is worried that his chest pressure may be worsening coronary disease.  Cardiac catheterization from 20152 reviewed. OM stent patent. PDA did not appear graftable. Fairly diffuse moderate CAD.   Swimming, exercise bicycle. Vertigo.   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   Surprised by CAD. Does not have much confidence in nuclear stress test. We discussed.   06/09/17-we have been increasing his antianginals, isosorbide to 90, metoprolol 50 twice a day.  Once again last catheterization 2015, stent to first obtuse marginal.  Diffuse nonobstructive LAD and circumflex disease.  Severe RCA stenosis involving distal branches. Multitude of complaints including neuropathy, vertigo, anxiety.  We discussed at length.   Past Medical History:  Diagnosis Date  . Arthritis    "knees, toes, hands" (06/26/2014)  . Ataxia   . Basal cell carcinoma    left temporal area-no residual  . Colon polyps  10-12-11   past hx.  . Exertional angina ([place]) 06/26/2014   Cath with DES to the OM, Bioflow protocol   . GERD (gastroesophageal reflux disease)   . H/O hiatal hernia    no problems  . High cholesterol   . Hypertension   . Ischemic chest pain 08/07/2014  . Occasional tremors    Bilateral hands  . Peripheral neuropathy   . Pneumonia 1940's X 2  . Pseudogout    "it moves around"  . Sleep apnea    no cpap used  . Type II diabetes mellitus ([place]) dx'd 1982   nsulin started ~ 2000  . Vertigo     Past Surgical History:  Procedure Laterality Date  . BACK SURGERY    . BASAL CELL CARCINOMA EXCISION Left ~ 2012   face  . CARDIAC CATHETERIZATION  02/2005   Dr. Mare Ferrari; negative.  Marland Kitchen CARDIAC CATHETERIZATION N/A 05/13/2016   Procedure: Left Heart Cath and Coronary Angiography;  Surgeon: Jerline Pain, MD;  Location: [facility];  Service: Cardiovascular;  Laterality: N/A;  . CAROTID ENDARTERECTOMY Left Ssept. 14, 2016   CEA  . CARPAL TUNNEL RELEASE Left 10/2009  . CARPAL TUNNEL RELEASE Right ~ 2013  . CATARACT EXTRACTION W/ INTRAOCULAR LENS IMPLANT Left 2000's  . CATARACT EXTRACTION W/ INTRAOCULAR LENS IMPLANT Right 2015  . COLONOSCOPY W/ POLYPECTOMY    . CORONARY ANGIOPLASTY WITH STENT PLACEMENT  06/26/2014   "1"  . CORONARY ANGIOPLASTY WITH STENT PLACEMENT  06/26/2014   pLAD 50%, d LAD 60%, oD1 80%,  mD1  70%, CFX 50%, OM 2 70%,  RCA 80%, PDA 95% (<61mm), OM1 99%-0% with Bio flow stent       . ELBOW SURGERY Bilateral ~ 2014   "for blockages; Dr. Amedeo Plenty"  . ENDARTERECTOMY Left 04/23/2015   Procedure: ENDARTERECTOMY CAROTID;  Surgeon: Conrad Bellfountain, MD;  Location: [facility];  Service: Vascular;  Laterality: Left;  . KNEE ARTHROSCOPY Left 05/2011   10'12-left knee  . LEFT HEART CATHETERIZATION WITH CORONARY ANGIOGRAM N/A 06/26/2014   Procedure: LEFT HEART CATHETERIZATION WITH CORONARY ANGIOGRAM;  Surgeon: Blane Ohara, MD;  Location: [facility];  Service: Cardiovascular;   Laterality: N/A;  . LEFT HEART CATHETERIZATION WITH CORONARY ANGIOGRAM N/A 08/07/2014   Procedure: LEFT HEART CATHETERIZATION WITH CORONARY ANGIOGRAM;  Surgeon: Blane Ohara, MD;  Location: [facility] C[facility];  Service: Cardiovascular;  Laterality: N/A;  . NASAL SEPTUM SURGERY  1979  . PATCH ANGIOPLASTY Left 04/23/2015   Procedure: PATCH ANGIOPLASTY USING 1CM X 6CM XENOSURE BIOLOGIC PATCH;  Surgeon: Conrad Queens, MD;  Location: [facility];  Service: Vascular;  Laterality: Left;  . [place]  . SHOULDER OPEN ROTATOR CUFF REPAIR  10/14/2011   Procedure: ROTATOR CUFF REPAIR SHOULDER OPEN;  Surgeon: Johnn Hai, MD;  Location: [facility];  Service: Orthopedics;  Laterality: Right;  . SUBACROMIAL DECOMPRESSION  10/14/2011   Procedure: SUBACROMIAL DECOMPRESSION;  Surgeon: Johnn Hai, MD;  Location: [facility];  Service: Orthopedics;  Laterality: Right;  . TRIGGER FINGER RELEASE Right ~ 2013-2014   "3rd & 4th digits"    Current Medications: Outpatient Medications Prior to Visit  Medication Sig Dispense Refill  . acetaminophen (TYLENOL) 325 MG tablet Take 650 mg by mouth at bedtime as needed (sleep).    Marland Kitchen aspirin 81 MG tablet Take 1 tablet (81 mg total) by mouth daily.    Marland Kitchen atorvastatin (LIPITOR) 40 MG tablet TAKE ONE TABLET BY MOUTH ONCE DAILY BEFORE  BREAKFAST 90 tablet 1  . colchicine 0.6 MG tablet Take 0.6 mg by mouth 2 (two) times daily as needed (gout).     Marland Kitchen diphenhydrAMINE (BENADRYL) 25 MG tablet Take 25 mg by mouth at bedtime as needed for sleep.    . dorzolamide-timolol (COSOPT) 22.3-6.8 MG/ML ophthalmic solution Place 1 drop into both eyes 2 (two) times daily.    . empagliflozin (JARDIANCE) 25 MG TABS tablet Take 25 mg by mouth daily.    Marland Kitchen HUMALOG KWIKPEN 200 UNIT/ML SOPN Inject 8 Units as directed 3 (three) times daily. SLIDING SCALE  1  . insulin glargine (LANTUS) 100 UNIT/ML injection Inject 80 Units into the skin at bedtime.     . Insulin Pen Needle (FIFTY50 PEN NEEDLES) 31G  X 8 MM MISC     . isosorbide mononitrate (IMDUR) 60 MG 24 hr tablet Take 1.5 tablets (90 mg total) by mouth daily. 135 tablet 2  . lisinopril (PRINIVIL,ZESTRIL) 10 MG tablet TAKE 1 TABLET BY MOUTH ONCE DAILY BEFORE BREAKFAST 90 tablet 3  . LYRICA 100 MG capsule Take 1 capsule by mouth 3 (three) times daily as needed. For neuropathic pain  2  . metFORMIN (GLUCOPHAGE) 1000 MG tablet TAKE ONE TABLET BY MOUTH TWICE DAILY WITH A MEAL 60 tablet 0  . Multiple Vitamin (MULTIVITAMIN) tablet Take 1 tablet by mouth daily.    . nitroGLYCERIN (NITROSTAT) 0.4 MG SL tablet Place 1 tablet (0.4 mg total) under the tongue every 5 (five) minutes as needed for chest pain. 25 tablet 3  .  Omega-3 Fatty Acids (FISH OIL) 600 MG CAPS Take 600 mg by mouth daily.    . pantoprazole (PROTONIX) 40 MG tablet Take 1 tablet (40 mg total) by mouth 2 (two) times daily. 180 tablet 2  . Probiotic Product (PROBIOTIC PO) Take 1 tablet by mouth daily.    . clopidogrel (PLAVIX) 75 MG tablet TAKE 1 TABLET BY MOUTH ONCE DAILY (Patient not taking: Reported on 06/09/2017) 90 tablet 3  . metoprolol tartrate (LOPRESSOR) 50 MG tablet Take 1 tablet (50 mg total) by mouth 2 (two) times daily. 180 tablet 3   No facility-administered medications prior to visit.      Allergies:   Gabapentin; Morphine; Morphine and related; and Naproxen   Social History   Social History  . Marital status: Married    Spouse name: N/A  . Number of children: 5  . Years of education: College   Social History Main Topics  . Smoking status: Former Smoker    Packs/day: 0.50    Years: 20.00    Types: Cigarettes    Quit date: 10/11/1988  . Smokeless tobacco: Never Used  . Alcohol use No     Comment: Quit drinking in 1990  . Drug use: No  . Sexual activity: No   Other Topics Concern  . None   Social History Narrative   Drinks about 2 cups of coffee a day, occasional diet pepsi.     Quit smoking approximately 27 years ago.  Family History:  The patient's  family history includes Cancer in his sister; Diabetes in his brother, mother, and sister; Heart attack (age of onset: 95) in his father; Hypertension in his brother and sister; Varicose Veins in his mother and sister.   ROS:   Please see the history of present illness.    Review of Systems  All other systems reviewed and are negative.     PHYSICAL EXAM:   VS:  BP 124/70   Pulse 72   Ht 5' 11.5" (1.816 m)   Wt 260 lb 3.2 oz (118 kg)   SpO2 99%   BMI 35.78 kg/m    GEN: Well nourished, well developed, in no acute distress  HEENT: normal  Neck: no JVD, carotid bruits, or masses Cardiac: RRR; no murmurs, rubs, or gallops,no edema  Respiratory:  clear to auscultation bilaterally, normal work of breathing GI: soft, nontender, nondistended, + BS MS: no deformity or atrophy  Skin: warm and dry, no rash Neuro:  Alert and Oriented x 3, Strength and sensation are intact Psych: euthymic mood, full affect   Wt Readings from Last 3 Encounters:  06/09/17 260 lb 3.2 oz (118 kg)  03/29/17 255 lb 6.4 oz (115.8 kg)  03/11/17 252 lb (114.3 kg)      Studies/Labs Reviewed:   EKG:  None today  Recent Labs: No results found for requested labs within last 8760 hours.   Lipid Panel No results found for: CHOL, TRIG, HDL, CHOLHDL, VLDL, LDLCALC, LDLDIRECT  Additional studies/ records that were reviewed today include:  ECHO  (07/21/15) - Left ventricle: The cavity size was normal. There was severe focal basal and mild concentric hypertrophy of the left ventricle. Systolic function was vigorous. The estimated ejection fraction was in the range of 65% to 70%. Wall motion was normal; there were no regional wall motion abnormalities. Doppler parameters are consistent with abnormal left ventricular relaxation (grade 1 diastolic dysfunction). Doppler parameters are consistent with elevated ventricular end-diastolic filling pressure.    ASSESSMENT:  1. Angina pectoris  ([place])   2. Bilateral carotid artery disease, unspecified type ([place])   3. Coronary artery disease due to lipid rich plaque      PLAN:  In order of problems listed above:  Coronary artery disease/Angina  -We will optimize his antianginal therapy without use of Ranexa.  Isosorbide 90, metoprolol 50 twice a day.  Continue to encourage weight loss, optimization of diet.  I would really like for him to go to cardiac rehab but at this time he would like to try to do this on his own.  I do not feel strongly that he needs a heart catheterization at this time.  If his symptoms escalate or become more worrisome we could consider however, cardiac catheterization was reviewed by many interventional colleagues, continuation of medical management was.  Essential hypertension  - Isosorbide, lisinopril, metoprolol. Controlled  Carotid artery disease  - Endarterectomy 04/23/15, left with no TIA symptoms. Dr. Bridgett Larsson. Aggressive secondary prevention  Hyperlipidemia  - Lipitor. PCP has been following this.  No changes.  Diabetes with insulin use  - Hemoglobin once he 7.0. Medicines reviewed.  He thinks that he may be having ulnar type neuropathies with Jardiance  Obesity  - Continue to encourage weight loss. This will also help his diabetes.  Like for me to cardiac rehab.  Vertigo  - Still having bad symptoms. After shower better.  Has seen several doctors for this.  Contemplating acupuncture.  Medication Adjustments/Labs and Tests Ordered: Current medicines are reviewed at length with the patient today.  Concerns regarding medicines are outlined above.  Medication changes, Labs and Tests ordered today are listed in the Patient Instructions below. Patient Instructions  Medication Instructions:  The current medical regimen is effective;  continue present plan and medications.  Follow-Up: Follow up in 6 months with Dr. Marlou Porch.  You will receive a letter in the mail 2 months before you are due.  Please  call us when you receive this letter to schedule your follow up appointment.  If you need a refill on your cardiac medications before your next appointment, please call your pharmacy.  Thank you for choosing [facility]!!        Signed, Candee Furbish, MD  06/09/2017 10:58 AM    [address], [place], [place]  [address] Phone: [phone]; Fax: [phone]

## 2017-06-09 NOTE — Patient Instructions (Signed)

## 2017-06-10 DIAGNOSIS — M65842 Other synovitis and tenosynovitis, left hand: Secondary | ICD-10-CM | POA: Diagnosis not present

## 2017-06-10 DIAGNOSIS — M65312 Trigger thumb, left thumb: Secondary | ICD-10-CM | POA: Diagnosis not present

## 2017-06-18 DIAGNOSIS — E1129 Type 2 diabetes mellitus with other diabetic kidney complication: Secondary | ICD-10-CM | POA: Diagnosis not present

## 2017-06-24 DIAGNOSIS — M65312 Trigger thumb, left thumb: Secondary | ICD-10-CM | POA: Diagnosis not present

## 2017-07-07 DIAGNOSIS — N182 Chronic kidney disease, stage 2 (mild): Secondary | ICD-10-CM | POA: Diagnosis not present

## 2017-07-07 DIAGNOSIS — E1129 Type 2 diabetes mellitus with other diabetic kidney complication: Secondary | ICD-10-CM | POA: Diagnosis not present

## 2017-07-07 DIAGNOSIS — I10 Essential (primary) hypertension: Secondary | ICD-10-CM | POA: Diagnosis not present

## 2017-07-07 DIAGNOSIS — E1165 Type 2 diabetes mellitus with hyperglycemia: Secondary | ICD-10-CM | POA: Diagnosis not present

## 2017-07-07 DIAGNOSIS — Z794 Long term (current) use of insulin: Secondary | ICD-10-CM | POA: Diagnosis not present

## 2017-07-20 DIAGNOSIS — R82998 Other abnormal findings in urine: Secondary | ICD-10-CM | POA: Diagnosis not present

## 2017-07-21 DIAGNOSIS — I10 Essential (primary) hypertension: Secondary | ICD-10-CM | POA: Diagnosis not present

## 2017-07-21 DIAGNOSIS — Z125 Encounter for screening for malignant neoplasm of prostate: Secondary | ICD-10-CM | POA: Diagnosis not present

## 2017-07-21 DIAGNOSIS — E1129 Type 2 diabetes mellitus with other diabetic kidney complication: Secondary | ICD-10-CM | POA: Diagnosis not present

## 2017-07-27 DIAGNOSIS — E1129 Type 2 diabetes mellitus with other diabetic kidney complication: Secondary | ICD-10-CM | POA: Diagnosis not present

## 2017-07-27 DIAGNOSIS — Z0000 Encounter for general adult medical examination without abnormal findings: Secondary | ICD-10-CM | POA: Diagnosis not present

## 2017-07-27 DIAGNOSIS — E1139 Type 2 diabetes mellitus with other diabetic ophthalmic complication: Secondary | ICD-10-CM | POA: Diagnosis not present

## 2017-07-27 DIAGNOSIS — I10 Essential (primary) hypertension: Secondary | ICD-10-CM | POA: Diagnosis not present

## 2017-07-28 ENCOUNTER — Other Ambulatory Visit: Payer: Self-pay | Admitting: Physician Assistant

## 2017-08-26 ENCOUNTER — Other Ambulatory Visit: Payer: Self-pay | Admitting: Cardiology

## 2017-08-27 DIAGNOSIS — E1165 Type 2 diabetes mellitus with hyperglycemia: Secondary | ICD-10-CM | POA: Diagnosis not present

## 2017-09-13 DIAGNOSIS — M9905 Segmental and somatic dysfunction of pelvic region: Secondary | ICD-10-CM | POA: Diagnosis not present

## 2017-09-13 DIAGNOSIS — M9903 Segmental and somatic dysfunction of lumbar region: Secondary | ICD-10-CM | POA: Diagnosis not present

## 2017-09-13 DIAGNOSIS — M5136 Other intervertebral disc degeneration, lumbar region: Secondary | ICD-10-CM | POA: Diagnosis not present

## 2017-09-13 DIAGNOSIS — M9904 Segmental and somatic dysfunction of sacral region: Secondary | ICD-10-CM | POA: Diagnosis not present

## 2017-09-16 DIAGNOSIS — M9903 Segmental and somatic dysfunction of lumbar region: Secondary | ICD-10-CM | POA: Diagnosis not present

## 2017-09-16 DIAGNOSIS — M5136 Other intervertebral disc degeneration, lumbar region: Secondary | ICD-10-CM | POA: Diagnosis not present

## 2017-09-16 DIAGNOSIS — M9904 Segmental and somatic dysfunction of sacral region: Secondary | ICD-10-CM | POA: Diagnosis not present

## 2017-09-16 DIAGNOSIS — M9905 Segmental and somatic dysfunction of pelvic region: Secondary | ICD-10-CM | POA: Diagnosis not present

## 2017-09-17 DIAGNOSIS — E1129 Type 2 diabetes mellitus with other diabetic kidney complication: Secondary | ICD-10-CM | POA: Diagnosis not present

## 2017-09-30 DIAGNOSIS — M5136 Other intervertebral disc degeneration, lumbar region: Secondary | ICD-10-CM | POA: Diagnosis not present

## 2017-09-30 DIAGNOSIS — M9904 Segmental and somatic dysfunction of sacral region: Secondary | ICD-10-CM | POA: Diagnosis not present

## 2017-09-30 DIAGNOSIS — M9903 Segmental and somatic dysfunction of lumbar region: Secondary | ICD-10-CM | POA: Diagnosis not present

## 2017-09-30 DIAGNOSIS — M9905 Segmental and somatic dysfunction of pelvic region: Secondary | ICD-10-CM | POA: Diagnosis not present

## 2017-10-03 DIAGNOSIS — M9905 Segmental and somatic dysfunction of pelvic region: Secondary | ICD-10-CM | POA: Diagnosis not present

## 2017-10-03 DIAGNOSIS — M5136 Other intervertebral disc degeneration, lumbar region: Secondary | ICD-10-CM | POA: Diagnosis not present

## 2017-10-03 DIAGNOSIS — M9903 Segmental and somatic dysfunction of lumbar region: Secondary | ICD-10-CM | POA: Diagnosis not present

## 2017-10-03 DIAGNOSIS — M9904 Segmental and somatic dysfunction of sacral region: Secondary | ICD-10-CM | POA: Diagnosis not present

## 2017-10-04 DIAGNOSIS — M9904 Segmental and somatic dysfunction of sacral region: Secondary | ICD-10-CM | POA: Diagnosis not present

## 2017-10-04 DIAGNOSIS — M9905 Segmental and somatic dysfunction of pelvic region: Secondary | ICD-10-CM | POA: Diagnosis not present

## 2017-10-04 DIAGNOSIS — M5136 Other intervertebral disc degeneration, lumbar region: Secondary | ICD-10-CM | POA: Diagnosis not present

## 2017-10-04 DIAGNOSIS — M9903 Segmental and somatic dysfunction of lumbar region: Secondary | ICD-10-CM | POA: Diagnosis not present

## 2017-10-05 DIAGNOSIS — M9904 Segmental and somatic dysfunction of sacral region: Secondary | ICD-10-CM | POA: Diagnosis not present

## 2017-10-05 DIAGNOSIS — M9903 Segmental and somatic dysfunction of lumbar region: Secondary | ICD-10-CM | POA: Diagnosis not present

## 2017-10-05 DIAGNOSIS — M5136 Other intervertebral disc degeneration, lumbar region: Secondary | ICD-10-CM | POA: Diagnosis not present

## 2017-10-05 DIAGNOSIS — M9905 Segmental and somatic dysfunction of pelvic region: Secondary | ICD-10-CM | POA: Diagnosis not present

## 2017-10-06 DIAGNOSIS — M9903 Segmental and somatic dysfunction of lumbar region: Secondary | ICD-10-CM | POA: Diagnosis not present

## 2017-10-06 DIAGNOSIS — M5136 Other intervertebral disc degeneration, lumbar region: Secondary | ICD-10-CM | POA: Diagnosis not present

## 2017-10-06 DIAGNOSIS — M9905 Segmental and somatic dysfunction of pelvic region: Secondary | ICD-10-CM | POA: Diagnosis not present

## 2017-10-06 DIAGNOSIS — M9904 Segmental and somatic dysfunction of sacral region: Secondary | ICD-10-CM | POA: Diagnosis not present

## 2017-10-07 DIAGNOSIS — M9905 Segmental and somatic dysfunction of pelvic region: Secondary | ICD-10-CM | POA: Diagnosis not present

## 2017-10-07 DIAGNOSIS — M5136 Other intervertebral disc degeneration, lumbar region: Secondary | ICD-10-CM | POA: Diagnosis not present

## 2017-10-07 DIAGNOSIS — M9904 Segmental and somatic dysfunction of sacral region: Secondary | ICD-10-CM | POA: Diagnosis not present

## 2017-10-07 DIAGNOSIS — M9903 Segmental and somatic dysfunction of lumbar region: Secondary | ICD-10-CM | POA: Diagnosis not present

## 2017-10-10 DIAGNOSIS — M9903 Segmental and somatic dysfunction of lumbar region: Secondary | ICD-10-CM | POA: Diagnosis not present

## 2017-10-10 DIAGNOSIS — M9904 Segmental and somatic dysfunction of sacral region: Secondary | ICD-10-CM | POA: Diagnosis not present

## 2017-10-10 DIAGNOSIS — M9905 Segmental and somatic dysfunction of pelvic region: Secondary | ICD-10-CM | POA: Diagnosis not present

## 2017-10-10 DIAGNOSIS — M5136 Other intervertebral disc degeneration, lumbar region: Secondary | ICD-10-CM | POA: Diagnosis not present

## 2017-10-11 DIAGNOSIS — M9905 Segmental and somatic dysfunction of pelvic region: Secondary | ICD-10-CM | POA: Diagnosis not present

## 2017-10-11 DIAGNOSIS — M9904 Segmental and somatic dysfunction of sacral region: Secondary | ICD-10-CM | POA: Diagnosis not present

## 2017-10-11 DIAGNOSIS — M9903 Segmental and somatic dysfunction of lumbar region: Secondary | ICD-10-CM | POA: Diagnosis not present

## 2017-10-11 DIAGNOSIS — M5136 Other intervertebral disc degeneration, lumbar region: Secondary | ICD-10-CM | POA: Diagnosis not present

## 2017-10-12 DIAGNOSIS — M5136 Other intervertebral disc degeneration, lumbar region: Secondary | ICD-10-CM | POA: Diagnosis not present

## 2017-10-12 DIAGNOSIS — M9904 Segmental and somatic dysfunction of sacral region: Secondary | ICD-10-CM | POA: Diagnosis not present

## 2017-10-12 DIAGNOSIS — M9905 Segmental and somatic dysfunction of pelvic region: Secondary | ICD-10-CM | POA: Diagnosis not present

## 2017-10-12 DIAGNOSIS — M9903 Segmental and somatic dysfunction of lumbar region: Secondary | ICD-10-CM | POA: Diagnosis not present

## 2017-10-13 DIAGNOSIS — M5136 Other intervertebral disc degeneration, lumbar region: Secondary | ICD-10-CM | POA: Diagnosis not present

## 2017-10-13 DIAGNOSIS — M9903 Segmental and somatic dysfunction of lumbar region: Secondary | ICD-10-CM | POA: Diagnosis not present

## 2017-10-13 DIAGNOSIS — M9905 Segmental and somatic dysfunction of pelvic region: Secondary | ICD-10-CM | POA: Diagnosis not present

## 2017-10-13 DIAGNOSIS — M4726 Other spondylosis with radiculopathy, lumbar region: Secondary | ICD-10-CM | POA: Diagnosis not present

## 2017-10-13 DIAGNOSIS — M9904 Segmental and somatic dysfunction of sacral region: Secondary | ICD-10-CM | POA: Diagnosis not present

## 2017-10-13 DIAGNOSIS — M5117 Intervertebral disc disorders with radiculopathy, lumbosacral region: Secondary | ICD-10-CM | POA: Diagnosis not present

## 2017-10-14 DIAGNOSIS — M9903 Segmental and somatic dysfunction of lumbar region: Secondary | ICD-10-CM | POA: Diagnosis not present

## 2017-10-14 DIAGNOSIS — M9904 Segmental and somatic dysfunction of sacral region: Secondary | ICD-10-CM | POA: Diagnosis not present

## 2017-10-14 DIAGNOSIS — M9905 Segmental and somatic dysfunction of pelvic region: Secondary | ICD-10-CM | POA: Diagnosis not present

## 2017-10-14 DIAGNOSIS — M5136 Other intervertebral disc degeneration, lumbar region: Secondary | ICD-10-CM | POA: Diagnosis not present

## 2017-10-17 DIAGNOSIS — M9905 Segmental and somatic dysfunction of pelvic region: Secondary | ICD-10-CM | POA: Diagnosis not present

## 2017-10-17 DIAGNOSIS — M9904 Segmental and somatic dysfunction of sacral region: Secondary | ICD-10-CM | POA: Diagnosis not present

## 2017-10-17 DIAGNOSIS — M5136 Other intervertebral disc degeneration, lumbar region: Secondary | ICD-10-CM | POA: Diagnosis not present

## 2017-10-17 DIAGNOSIS — M9903 Segmental and somatic dysfunction of lumbar region: Secondary | ICD-10-CM | POA: Diagnosis not present

## 2017-10-18 DIAGNOSIS — Z6835 Body mass index (BMI) 35.0-35.9, adult: Secondary | ICD-10-CM | POA: Diagnosis not present

## 2017-10-18 DIAGNOSIS — M5126 Other intervertebral disc displacement, lumbar region: Secondary | ICD-10-CM | POA: Diagnosis not present

## 2017-10-18 DIAGNOSIS — I10 Essential (primary) hypertension: Secondary | ICD-10-CM | POA: Diagnosis not present

## 2017-10-20 ENCOUNTER — Telehealth: Payer: Self-pay | Admitting: Cardiology

## 2017-10-20 NOTE — Telephone Encounter (Signed)
° °  [facility] Pre-operative Risk Assessment    Request for surgical clearance:  1. What type of surgery is being performed? Microdiscectomy   2. When is this surgery scheduled? TBD  3. What type of clearance is required (medical clearance vs. Pharmacy clearance to hold med vs. Both)? Both  4. Are there any medications that need to be held prior to surgery and how long? Plavix and Aspirin   5. Practice name and name of physician performing surgery? [facility], Dr. Ashok Pall  6. What is your office phone and fax number? PH# [phone] ext. [phone], Fax: [phone], ATTN: Nikki  7. Anesthesia type (None, local, MAC, general) ? Not listed.   Seth Young 10/20/2017, 9:07 AM  _________________________________________________________________   (provider comments below)

## 2017-10-24 NOTE — Telephone Encounter (Signed)
   Primary Cardiologist: No primary care provider on file.  Chart reviewed as part of pre-operative protocol coverage. Patient was contacted 10/24/2017 in reference to pre-operative risk assessment for pending surgery as outlined below.  Seth Young was last seen on 06/09/2017  by Dr. Marlou Porch.  Since that day, Seth Young has done well, he has no recurrent chest discomfort, he has been medically compliant.  Therefore, based on ACC/AHA guidelines, the patient would be at acceptable risk for the planned procedure without further cardiovascular testing. The patient is on Plavix and aspirin, he will need to hold his Plavix for a minimum of 5 days prior to surgery. This is been explained to him although he does not have a surgery date. We'll also send it to pharmacy for verification.  I will route this recommendation to the requesting party via Epic fax function and remove from pre-op pool.  Please call with questions. 10/24/2017, 4:32 PM

## 2017-10-27 ENCOUNTER — Other Ambulatory Visit: Payer: Self-pay | Admitting: Neurosurgery

## 2017-11-01 ENCOUNTER — Other Ambulatory Visit: Payer: Self-pay

## 2017-11-01 ENCOUNTER — Encounter (HOSPITAL_COMMUNITY): Payer: Self-pay | Admitting: *Deleted

## 2017-11-01 NOTE — Progress Notes (Signed)
Anesthesia Chart Review:  Pt is a same day work up  Pt is a 77 year old male scheduled for L3-4 microdiscectomy on 11/02/2017 with Ashok Pall, MD  - PCP is Burnard Bunting, MD - Cardiologist is Candee Furbish, MD. Last office visit 06/09/17. Cleared for surgery by Jory Sims, NP on 10/24/17.   PMH includes:  CAD (DES to OM), angina, HTN, DM, hyperlipidemia, OSA, GERD. Former smoker. BMI 36. S/p L CEA 04/23/15. S/p R open rotator cuff repair 10/14/11.   Medications include: ASA 81mg , lipitor, plavix, jardiance, humalog, lantus, imdur, lisinopril, metformin, metoprolol, protonix. Last dose ASA and plavix 10/28/17  Labs will be obtained day of surgery  EKG 03/11/17: Sinus rhythm with first-degree AV block  Carotid duplex 02/23/17:  1. R ICA velocities suggest 1-39% stenosis 2. Patent L CEA site with evidence of mild hyperplasia in the surgical bulb  L cardiac cath 05/13/16  The left ventricular systolic function is normal. EF 53%  LV end diastolic pressure is normal. 11 mmHg  There is no aortic valve stenosis.  There is no mitral valve stenosis and no mitral valve prolapse evident.  The previously placed obtuse marginal stent was widely patent.  Right coronary artery disease was diffuse and similar to prior cardiac catheterization in 2016 with the exception of increased stenosis in the small branch (80%) of the PDA as well as distal segment of small acute marginal branch. There still persists mid RCA disease of up to 60%. - We will continue with aggressive medical management, I increased his isosorbide to 60 mg. If his anginal symptoms escalate, one could consider balloon angioplasty of the PDA segment however this vessel is quite small in caliber likely 1.5 mm vessel. The durability of such procedure may be low. His symptoms also consist of chest burning similar to GERD-like symptoms however these were the same symptoms he felt prior to his circumflex stent. The main objective of this  diagnostic cardiac catheterization, to exclude advancement to triple-vessel coronary artery disease was reassuring.   Echo 07/21/15:  - Left ventricle: The cavity size was normal. There was severe focal basal and mild concentric hypertrophy of the left ventricle. Systolic function was vigorous. The estimated ejection fraction was in the range of 65% to 70%. Wall motion was normal; there were no regional wall motion abnormalities. Doppler parameters are consistent with abnormal left ventricular relaxation (grade 1 diastolic dysfunction). Doppler parameters are consistent with elevated ventricular end-diastolic filling pressure. - Aortic valve: Trileaflet; normal thickness leaflets. There was mild to moderate regurgitation. - Aortic root: The aortic root was normal in size. - Ascending aorta: The ascending aorta was normal in size. - Mitral valve: Structurally normal valve. There was trivial regurgitation. - Left atrium: The atrium was normal in size. - Right ventricle: The cavity size was normal. Wall thickness was normal. - Right atrium: The atrium was normal in size. - Tricuspid valve: There was mild regurgitation. - Pulmonic valve: There was no regurgitation. - Pulmonary arteries: Systolic pressure was within the normal range. - Inferior vena cava: The vessel was normal in size. - Pericardium, extracardiac: There was no pericardial effusion.  If labs acceptable day of surgery, I anticipate pt can proceed with surgery as scheduled.   Willeen Cass, FNP-BC [facility] Stay [facility]/Anesthesiology Phone: [phone] 11/01/2017 12:28 PM

## 2017-11-01 NOTE — Progress Notes (Signed)
Pt denies any acute cardiopulmonary issues. Pt under the care of Dr. Marlou Porch, Cardiology. Pt made aware to stop taking vitamins, fish oil, Melatonin, Probiotics and herbal medications. Do not take any NSAIDs ie: Ibuprofen, Advil, Naproxen (Aleve), Motrin, BC and Goody powder. Pt stated that last dose of Aspirin and Plavix was Friday as instructed by MD. Pt made aware to take 37-40 units of Lantus insulin tonight (half). Pt made aware to not take Metformin and Jardiance on DOS. Pt made aware to check BG every 2 hours prior to arrival to hospital on DOS. Pt made aware to treat a BG < 70 with 4 ounces of cranberry juice, wait 15 minutes after intervention to recheck BG, if BG remains < 70, call Short Stay unit to speak with a nurse. Pt having problems regulating BG " because of leg." Pt made aware to call SS for BG of 90 since he stated that he experiences symptoms of Hypoglycemia at 90. Pt verbalized understanding of all pre-op instructions. Anesthesia asked to review cardiac clearance note in Epic.

## 2017-11-02 ENCOUNTER — Encounter (HOSPITAL_COMMUNITY): Payer: Self-pay

## 2017-11-02 ENCOUNTER — Ambulatory Visit (HOSPITAL_COMMUNITY): Payer: Medicare Other | Admitting: Emergency Medicine

## 2017-11-02 ENCOUNTER — Ambulatory Visit (HOSPITAL_COMMUNITY): Payer: Medicare Other

## 2017-11-02 ENCOUNTER — Ambulatory Visit (HOSPITAL_COMMUNITY)
Admission: RE | Disposition: A | Discharge status: Home / Self Care | Payer: Self-pay | Source: Ambulatory Visit | Attending: Neurosurgery

## 2017-11-02 ENCOUNTER — Ambulatory Visit (HOSPITAL_COMMUNITY)
Admission: RE | Admit: 2017-11-02 | Discharge: 2017-11-03 | Disposition: A | Discharge status: Home / Self Care | Payer: Medicare Other | Source: Ambulatory Visit | Attending: Neurosurgery | Admitting: Neurosurgery

## 2017-11-02 DIAGNOSIS — Z886 Allergy status to analgesic agent status: Secondary | ICD-10-CM | POA: Diagnosis not present

## 2017-11-02 DIAGNOSIS — Z87891 Personal history of nicotine dependence: Secondary | ICD-10-CM | POA: Diagnosis not present

## 2017-11-02 DIAGNOSIS — Z882 Allergy status to sulfonamides status: Secondary | ICD-10-CM | POA: Diagnosis not present

## 2017-11-02 DIAGNOSIS — I2510 Atherosclerotic heart disease of native coronary artery without angina pectoris: Secondary | ICD-10-CM | POA: Insufficient documentation

## 2017-11-02 DIAGNOSIS — K219 Gastro-esophageal reflux disease without esophagitis: Secondary | ICD-10-CM | POA: Diagnosis not present

## 2017-11-02 DIAGNOSIS — E1142 Type 2 diabetes mellitus with diabetic polyneuropathy: Secondary | ICD-10-CM | POA: Insufficient documentation

## 2017-11-02 DIAGNOSIS — E785 Hyperlipidemia, unspecified: Secondary | ICD-10-CM | POA: Diagnosis not present

## 2017-11-02 DIAGNOSIS — Z955 Presence of coronary angioplasty implant and graft: Secondary | ICD-10-CM | POA: Diagnosis not present

## 2017-11-02 DIAGNOSIS — I440 Atrioventricular block, first degree: Secondary | ICD-10-CM | POA: Insufficient documentation

## 2017-11-02 DIAGNOSIS — Z981 Arthrodesis status: Secondary | ICD-10-CM | POA: Diagnosis not present

## 2017-11-02 DIAGNOSIS — I082 Rheumatic disorders of both aortic and tricuspid valves: Secondary | ICD-10-CM | POA: Diagnosis not present

## 2017-11-02 DIAGNOSIS — Z7982 Long term (current) use of aspirin: Secondary | ICD-10-CM | POA: Insufficient documentation

## 2017-11-02 DIAGNOSIS — Z885 Allergy status to narcotic agent status: Secondary | ICD-10-CM | POA: Insufficient documentation

## 2017-11-02 DIAGNOSIS — I10 Essential (primary) hypertension: Secondary | ICD-10-CM | POA: Diagnosis not present

## 2017-11-02 DIAGNOSIS — E119 Type 2 diabetes mellitus without complications: Secondary | ICD-10-CM | POA: Diagnosis not present

## 2017-11-02 DIAGNOSIS — G4733 Obstructive sleep apnea (adult) (pediatric): Secondary | ICD-10-CM | POA: Diagnosis not present

## 2017-11-02 DIAGNOSIS — Z419 Encounter for procedure for purposes other than remedying health state, unspecified: Secondary | ICD-10-CM

## 2017-11-02 DIAGNOSIS — M5126 Other intervertebral disc displacement, lumbar region: Secondary | ICD-10-CM | POA: Diagnosis not present

## 2017-11-02 DIAGNOSIS — Z79899 Other long term (current) drug therapy: Secondary | ICD-10-CM | POA: Diagnosis not present

## 2017-11-02 DIAGNOSIS — I201 Angina pectoris with documented spasm: Secondary | ICD-10-CM | POA: Diagnosis not present

## 2017-11-02 HISTORY — PX: LUMBAR LAMINECTOMY/DECOMPRESSION MICRODISCECTOMY: SHX5026

## 2017-11-02 LAB — CBC
HCT: 45.1 % (ref 39.0–52.0)
HEMOGLOBIN: 13.2 g/dL (ref 13.0–17.0)
MCH: 21.8 pg — ABNORMAL LOW (ref 26.0–34.0)
MCHC: 29.3 g/dL — AB (ref 30.0–36.0)
MCV: 74.5 fL — ABNORMAL LOW (ref 78.0–100.0)
Platelets: 197 10*3/uL (ref 150–400)
RBC: 6.05 MIL/uL — AB (ref 4.22–5.81)
RDW: 19.2 % — ABNORMAL HIGH (ref 11.5–15.5)
WBC: 8.7 10*3/uL (ref 4.0–10.5)

## 2017-11-02 LAB — BASIC METABOLIC PANEL
Anion gap: 9 (ref 5–15)
BUN: 13 mg/dL (ref 6–20)
CHLORIDE: 106 mmol/L (ref 101–111)
CO2: 23 mmol/L (ref 22–32)
CREATININE: 1.26 mg/dL — AB (ref 0.61–1.24)
Calcium: 9.8 mg/dL (ref 8.9–10.3)
GFR calc non Af Amer: 54 mL/min — ABNORMAL LOW (ref >60)
Glucose, Bld: 159 mg/dL — ABNORMAL HIGH (ref 65–99)
Potassium: 4.1 mmol/L (ref 3.5–5.1)
SODIUM: 138 mmol/L (ref 135–145)

## 2017-11-02 LAB — GLUCOSE, CAPILLARY
GLUCOSE-CAPILLARY: 170 mg/dL — AB (ref 65–99)
GLUCOSE-CAPILLARY: 141 mg/dL — AB (ref 65–99)
GLUCOSE-CAPILLARY: 261 mg/dL — AB (ref 65–99)
Glucose-Capillary: 157 mg/dL — ABNORMAL HIGH (ref 65–99)
Glucose-Capillary: 214 mg/dL — ABNORMAL HIGH (ref 65–99)

## 2017-11-02 LAB — HEMOGLOBIN A1C
Hgb A1c MFr Bld: 6.7 % — ABNORMAL HIGH (ref 4.8–5.6)
Mean Plasma Glucose: 145.59 mg/dL

## 2017-11-02 LAB — PROTIME-INR
INR: 1.1
PROTHROMBIN TIME: 14.2 s (ref 11.4–15.2)

## 2017-11-02 SURGERY — LUMBAR LAMINECTOMY/DECOMPRESSION MICRODISCECTOMY 1 LEVEL
Anesthesia: General | Laterality: Left

## 2017-11-02 MED ORDER — POLYVINYL ALCOHOL 1.4 % OP SOLN: 1.0000 [drp] | Freq: Every day | OPHTHALMIC | Status: DC | PRN

## 2017-11-02 MED ORDER — LACTATED RINGERS IV SOLN
INTRAVENOUS | Status: DC | PRN
  Administered 2017-11-02 (×2): via INTRAVENOUS

## 2017-11-02 MED ORDER — LIDOCAINE HCL (CARDIAC) 20 MG/ML IV SOLN
INTRAVENOUS | Status: DC | PRN
  Administered 2017-11-02: 100 mg via INTRATRACHEAL

## 2017-11-02 MED ORDER — ONDANSETRON HCL 4 MG/2ML IJ SOLN
INTRAMUSCULAR | Status: DC | PRN
  Administered 2017-11-02: 4 mg via INTRAVENOUS

## 2017-11-02 MED ORDER — FENTANYL CITRATE (PF) 100 MCG/2ML IJ SOLN: 25.0000 ug | INTRAMUSCULAR | Status: DC | PRN

## 2017-11-02 MED ORDER — INSULIN ASPART 100 UNIT/ML ~~LOC~~ SOLN
0.0000 [IU] | Freq: Three times a day (TID) | SUBCUTANEOUS | Status: DC
  Administered 2017-11-03: 4 [IU] via SUBCUTANEOUS

## 2017-11-02 MED ORDER — INSULIN GLARGINE 100 UNIT/ML ~~LOC~~ SOLN
80.0000 [IU] | Freq: Every day | SUBCUTANEOUS | Status: DC
  Administered 2017-11-02: 80 [IU] via SUBCUTANEOUS
  Filled 2017-11-02 (×2): qty 0.8

## 2017-11-02 MED ORDER — 0.9 % SODIUM CHLORIDE (POUR BTL) OPTIME
TOPICAL | Status: DC | PRN
  Administered 2017-11-02: 1000 mL

## 2017-11-02 MED ORDER — THROMBIN 5000 UNITS EX SOLR
CUTANEOUS | Status: AC
  Filled 2017-11-02: qty 10000

## 2017-11-02 MED ORDER — CEFAZOLIN SODIUM-DEXTROSE 2-4 GM/100ML-% IV SOLN
2.0000 g | INTRAVENOUS | Status: AC
  Administered 2017-11-02: 2 g via INTRAVENOUS

## 2017-11-02 MED ORDER — ROCURONIUM BROMIDE 10 MG/ML (PF) SYRINGE
PREFILLED_SYRINGE | INTRAVENOUS | Status: AC
  Filled 2017-11-02: qty 10

## 2017-11-02 MED ORDER — DEXAMETHASONE SODIUM PHOSPHATE 10 MG/ML IJ SOLN
INTRAMUSCULAR | Status: DC | PRN
  Administered 2017-11-02: 10 mg via INTRAVENOUS

## 2017-11-02 MED ORDER — CEFAZOLIN SODIUM-DEXTROSE 2-4 GM/100ML-% IV SOLN
INTRAVENOUS | Status: AC
  Filled 2017-11-02: qty 100

## 2017-11-02 MED ORDER — BUPIVACAINE HCL (PF) 0.25 % IJ SOLN
INTRAMUSCULAR | Status: AC
  Filled 2017-11-02: qty 30

## 2017-11-02 MED ORDER — DEXAMETHASONE SODIUM PHOSPHATE 10 MG/ML IJ SOLN
INTRAMUSCULAR | Status: AC
  Filled 2017-11-02: qty 1

## 2017-11-02 MED ORDER — PHENYLEPHRINE HCL 10 MG/ML IJ SOLN
INTRAMUSCULAR | Status: DC | PRN
  Administered 2017-11-02 (×5): 80 ug via INTRAVENOUS

## 2017-11-02 MED ORDER — SUGAMMADEX SODIUM 500 MG/5ML IV SOLN
INTRAVENOUS | Status: DC | PRN
  Administered 2017-11-02: 300 mg via INTRAVENOUS

## 2017-11-02 MED ORDER — DEXTROSE 5 % IV SOLN
INTRAVENOUS | Status: DC | PRN
  Administered 2017-11-02: 25 ug/min via INTRAVENOUS

## 2017-11-02 MED ORDER — TRAMADOL HCL 50 MG PO TABS
50.0000 mg | ORAL_TABLET | Freq: Four times a day (QID) | ORAL | Status: DC | PRN
  Administered 2017-11-02: 50 mg via ORAL

## 2017-11-02 MED ORDER — INSULIN LISPRO 200 UNIT/ML ~~LOC~~ SOPN: 10.0000 [IU] | PEN_INJECTOR | Freq: Three times a day (TID) | SUBCUTANEOUS | Status: DC

## 2017-11-02 MED ORDER — EPHEDRINE 5 MG/ML INJ
INTRAVENOUS | Status: AC
  Filled 2017-11-02: qty 10

## 2017-11-02 MED ORDER — MIDAZOLAM HCL 5 MG/5ML IJ SOLN
INTRAMUSCULAR | Status: DC | PRN
  Administered 2017-11-02: 2 mg via INTRAVENOUS

## 2017-11-02 MED ORDER — PREGABALIN 50 MG PO CAPS
100.0000 mg | ORAL_CAPSULE | Freq: Two times a day (BID) | ORAL | Status: DC
  Administered 2017-11-02 – 2017-11-03 (×3): 100 mg via ORAL
  Filled 2017-11-02 (×3): qty 2

## 2017-11-02 MED ORDER — ONDANSETRON HCL 4 MG/2ML IJ SOLN: 4.0000 mg | Freq: Four times a day (QID) | INTRAMUSCULAR | Status: DC | PRN

## 2017-11-02 MED ORDER — LIDOCAINE HCL (CARDIAC) 20 MG/ML IV SOLN
INTRAVENOUS | Status: AC
  Filled 2017-11-02: qty 10

## 2017-11-02 MED ORDER — MENTHOL 3 MG MT LOZG: 1.0000 | LOZENGE | OROMUCOSAL | Status: DC | PRN

## 2017-11-02 MED ORDER — LIDOCAINE-EPINEPHRINE 0.5 %-1:200000 IJ SOLN
INTRAMUSCULAR | Status: DC | PRN
  Administered 2017-11-02: 9 mL

## 2017-11-02 MED ORDER — ONDANSETRON HCL 4 MG/2ML IJ SOLN
INTRAMUSCULAR | Status: AC
  Filled 2017-11-02: qty 2

## 2017-11-02 MED ORDER — ONDANSETRON HCL 4 MG/2ML IJ SOLN: 4.0000 mg | Freq: Once | INTRAMUSCULAR | Status: DC | PRN

## 2017-11-02 MED ORDER — PROPOFOL 10 MG/ML IV BOLUS
INTRAVENOUS | Status: AC
  Filled 2017-11-02: qty 20

## 2017-11-02 MED ORDER — OXYCODONE HCL ER 10 MG PO T12A
10.0000 mg | EXTENDED_RELEASE_TABLET | Freq: Two times a day (BID) | ORAL | Status: DC
  Administered 2017-11-02 – 2017-11-03 (×3): 10 mg via ORAL
  Filled 2017-11-02 (×3): qty 1

## 2017-11-02 MED ORDER — CANAGLIFLOZIN 100 MG PO TABS: 100.0000 mg | ORAL_TABLET | Freq: Every day | ORAL | Status: DC

## 2017-11-02 MED ORDER — METOPROLOL TARTRATE 25 MG PO TABS
50.0000 mg | ORAL_TABLET | Freq: Two times a day (BID) | ORAL | Status: DC
  Administered 2017-11-02 – 2017-11-03 (×2): 50 mg via ORAL
  Filled 2017-11-02 (×2): qty 2

## 2017-11-02 MED ORDER — MIDAZOLAM HCL 2 MG/2ML IJ SOLN
INTRAMUSCULAR | Status: AC
  Filled 2017-11-02: qty 2

## 2017-11-02 MED ORDER — SIMETHICONE 80 MG PO CHEW: 80.0000 mg | CHEWABLE_TABLET | Freq: Every day | ORAL | Status: DC | PRN

## 2017-11-02 MED ORDER — ACETAMINOPHEN 650 MG RE SUPP: 650.0000 mg | RECTAL | Status: DC | PRN

## 2017-11-02 MED ORDER — ACETAMINOPHEN 325 MG PO TABS: 650.0000 mg | ORAL_TABLET | ORAL | Status: DC | PRN

## 2017-11-02 MED ORDER — DIAZEPAM 5 MG PO TABS
ORAL_TABLET | ORAL | Status: AC
  Filled 2017-11-02: qty 1

## 2017-11-02 MED ORDER — MELATONIN 3 MG PO TABS
3.0000 mg | ORAL_TABLET | Freq: Every day | ORAL | Status: DC
  Administered 2017-11-02: 3 mg via ORAL
  Filled 2017-11-02 (×2): qty 1

## 2017-11-02 MED ORDER — CALCIUM CARBONATE-SIMETHICONE 750-80 MG PO CHEW: 2.0000 | CHEWABLE_TABLET | Freq: Every day | ORAL | Status: DC | PRN

## 2017-11-02 MED ORDER — CALCIUM CARBONATE ANTACID 500 MG PO CHEW
1.0000 | CHEWABLE_TABLET | Freq: Every day | ORAL | Status: DC | PRN
Start: 2017-11-02 — End: 2017-11-03

## 2017-11-02 MED ORDER — ROCURONIUM BROMIDE 100 MG/10ML IV SOLN
INTRAVENOUS | Status: DC | PRN
  Administered 2017-11-02: 50 mg via INTRAVENOUS
  Administered 2017-11-02: 20 mg via INTRAVENOUS

## 2017-11-02 MED ORDER — TRAMADOL HCL 50 MG PO TABS
ORAL_TABLET | ORAL | Status: AC
  Filled 2017-11-02: qty 1

## 2017-11-02 MED ORDER — ONDANSETRON HCL 4 MG PO TABS: 4.0000 mg | ORAL_TABLET | Freq: Four times a day (QID) | ORAL | Status: DC | PRN

## 2017-11-02 MED ORDER — KETOROLAC TROMETHAMINE 15 MG/ML IJ SOLN
7.5000 mg | Freq: Four times a day (QID) | INTRAMUSCULAR | Status: DC
  Administered 2017-11-02 – 2017-11-03 (×3): 7.5 mg via INTRAVENOUS
  Filled 2017-11-02 (×3): qty 1

## 2017-11-02 MED ORDER — CHLORHEXIDINE GLUCONATE CLOTH 2 % EX PADS: 6.0000 | MEDICATED_PAD | Freq: Once | CUTANEOUS | Status: DC

## 2017-11-02 MED ORDER — SUGAMMADEX SODIUM 500 MG/5ML IV SOLN
INTRAVENOUS | Status: AC
  Filled 2017-11-02: qty 5

## 2017-11-02 MED ORDER — SODIUM CHLORIDE 0.9% FLUSH
3.0000 mL | Freq: Two times a day (BID) | INTRAVENOUS | Status: DC
  Administered 2017-11-02 (×2): 3 mL via INTRAVENOUS

## 2017-11-02 MED ORDER — VITAMIN B-12 1000 MCG PO TABS
5000.0000 ug | ORAL_TABLET | Freq: Every day | ORAL | Status: DC
  Administered 2017-11-03: 5000 ug via ORAL
  Filled 2017-11-02 (×2): qty 5

## 2017-11-02 MED ORDER — HEMOSTATIC AGENTS (NO CHARGE) OPTIME
TOPICAL | Status: DC | PRN
  Administered 2017-11-02: 1 via TOPICAL

## 2017-11-02 MED ORDER — PHENYLEPHRINE 40 MCG/ML (10ML) SYRINGE FOR IV PUSH (FOR BLOOD PRESSURE SUPPORT)
PREFILLED_SYRINGE | INTRAVENOUS | Status: AC
  Filled 2017-11-02: qty 10

## 2017-11-02 MED ORDER — MELATONIN 5 MG PO TABS: 5.0000 mg | ORAL_TABLET | Freq: Every day | ORAL | Status: DC

## 2017-11-02 MED ORDER — PHENOL 1.4 % MT LIQD: 1.0000 | OROMUCOSAL | Status: DC | PRN

## 2017-11-02 MED ORDER — ALUM & MAG HYDROXIDE-SIMETH 200-200-20 MG/5ML PO SUSP: 15.0000 mL | Freq: Four times a day (QID) | ORAL | Status: DC | PRN

## 2017-11-02 MED ORDER — PROPOFOL 10 MG/ML IV BOLUS
INTRAVENOUS | Status: DC | PRN
  Administered 2017-11-02: 150 mg via INTRAVENOUS

## 2017-11-02 MED ORDER — SODIUM CHLORIDE 0.9% FLUSH: 3.0000 mL | INTRAVENOUS | Status: DC | PRN

## 2017-11-02 MED ORDER — LIDOCAINE-EPINEPHRINE 0.5 %-1:200000 IJ SOLN
INTRAMUSCULAR | Status: AC
  Filled 2017-11-02: qty 1

## 2017-11-02 MED ORDER — METFORMIN HCL 500 MG PO TABS
1000.0000 mg | ORAL_TABLET | Freq: Two times a day (BID) | ORAL | Status: DC
  Administered 2017-11-02 – 2017-11-03 (×2): 1000 mg via ORAL
  Filled 2017-11-02 (×2): qty 2

## 2017-11-02 MED ORDER — THROMBIN (RECOMBINANT) 5000 UNITS EX SOLR
CUTANEOUS | Status: DC | PRN
  Administered 2017-11-02 (×2): 5000 [IU] via TOPICAL

## 2017-11-02 MED ORDER — LISINOPRIL 10 MG PO TABS
10.0000 mg | ORAL_TABLET | Freq: Every day | ORAL | Status: DC
  Administered 2017-11-02 – 2017-11-03 (×2): 10 mg via ORAL
  Filled 2017-11-02 (×2): qty 1

## 2017-11-02 MED ORDER — FENTANYL CITRATE (PF) 250 MCG/5ML IJ SOLN
INTRAMUSCULAR | Status: AC
  Filled 2017-11-02: qty 5

## 2017-11-02 MED ORDER — FENTANYL CITRATE (PF) 250 MCG/5ML IJ SOLN
INTRAMUSCULAR | Status: DC | PRN
  Administered 2017-11-02: 50 ug via INTRAVENOUS
  Administered 2017-11-02 (×2): 100 ug via INTRAVENOUS

## 2017-11-02 MED ORDER — NITROGLYCERIN 0.4 MG SL SUBL: 0.4000 mg | SUBLINGUAL_TABLET | SUBLINGUAL | Status: DC | PRN

## 2017-11-02 MED ORDER — ROCURONIUM BROMIDE 10 MG/ML (PF) SYRINGE
PREFILLED_SYRINGE | INTRAVENOUS | Status: AC
  Filled 2017-11-02: qty 5

## 2017-11-02 MED ORDER — ASPIRIN EC 81 MG PO TBEC
81.0000 mg | DELAYED_RELEASE_TABLET | Freq: Every day | ORAL | Status: DC
  Administered 2017-11-02 – 2017-11-03 (×2): 81 mg via ORAL
  Filled 2017-11-02 (×2): qty 1

## 2017-11-02 MED ORDER — ATORVASTATIN CALCIUM 20 MG PO TABS
20.0000 mg | ORAL_TABLET | Freq: Every day | ORAL | Status: DC
  Administered 2017-11-02: 20 mg via ORAL
  Filled 2017-11-02: qty 1

## 2017-11-02 MED ORDER — GUAIFENESIN ER 600 MG PO TB12
600.0000 mg | ORAL_TABLET | Freq: Two times a day (BID) | ORAL | Status: DC | PRN
  Filled 2017-11-02: qty 1

## 2017-11-02 MED ORDER — DIAZEPAM 5 MG PO TABS
5.0000 mg | ORAL_TABLET | Freq: Four times a day (QID) | ORAL | Status: DC | PRN
  Administered 2017-11-02 – 2017-11-03 (×3): 5 mg via ORAL
  Filled 2017-11-02 (×2): qty 1

## 2017-11-02 MED ORDER — POTASSIUM CHLORIDE IN NACL 20-0.9 MEQ/L-% IV SOLN: INTRAVENOUS | Status: DC

## 2017-11-02 MED ORDER — ISOSORBIDE MONONITRATE ER 60 MG PO TB24
90.0000 mg | ORAL_TABLET | Freq: Every day | ORAL | Status: DC
  Administered 2017-11-03: 90 mg via ORAL
  Filled 2017-11-02: qty 1

## 2017-11-02 MED ORDER — DORZOLAMIDE HCL-TIMOLOL MAL 2-0.5 % OP SOLN
1.0000 [drp] | Freq: Two times a day (BID) | OPHTHALMIC | Status: DC
  Administered 2017-11-02: 1 [drp] via OPHTHALMIC
  Filled 2017-11-02: qty 10

## 2017-11-02 SURGICAL SUPPLY — 54 items
ADH SKN CLS APL DERMABOND .7 (GAUZE/BANDAGES/DRESSINGS) ×1
APL SKNCLS STERI-STRIP NONHPOA (GAUZE/BANDAGES/DRESSINGS)
BENZOIN TINCTURE PRP APPL 2/3 (GAUZE/BANDAGES/DRESSINGS) IMPLANT
BLADE CLIPPER SURG (BLADE) IMPLANT
BUR MATCHSTICK NEURO 3.0 LAGG (BURR) ×2 IMPLANT
BUR PRECISION FLUTE 5.0 (BURR) ×2 IMPLANT
CANISTER SUCT 3000ML PPV (MISCELLANEOUS) ×2 IMPLANT
CARTRIDGE OIL MAESTRO DRILL (MISCELLANEOUS) ×1 IMPLANT
DECANTER SPIKE VIAL GLASS SM (MISCELLANEOUS) ×2 IMPLANT
DERMABOND ADVANCED (GAUZE/BANDAGES/DRESSINGS) ×1
DERMABOND ADVANCED .7 DNX12 (GAUZE/BANDAGES/DRESSINGS) ×1 IMPLANT
DIFFUSER DRILL AIR PNEUMATIC (MISCELLANEOUS) ×2 IMPLANT
DRAPE LAPAROTOMY 100X72X124 (DRAPES) ×2 IMPLANT
DRAPE MICROSCOPE LEICA (MISCELLANEOUS) ×2 IMPLANT
DRAPE SURG 17X23 STRL (DRAPES) ×2 IMPLANT
DURAPREP 26ML APPLICATOR (WOUND CARE) ×2 IMPLANT
ELECT BLADE 4.0 EZ CLEAN MEGAD (MISCELLANEOUS) ×2
ELECT REM PT RETURN 9FT ADLT (ELECTROSURGICAL) ×2
ELECTRODE BLDE 4.0 EZ CLN MEGD (MISCELLANEOUS) ×1 IMPLANT
ELECTRODE REM PT RTRN 9FT ADLT (ELECTROSURGICAL) ×1 IMPLANT
GAUZE SPONGE 4X4 12PLY STRL (GAUZE/BANDAGES/DRESSINGS) IMPLANT
GAUZE SPONGE 4X4 16PLY XRAY LF (GAUZE/BANDAGES/DRESSINGS) IMPLANT
GLOVE BIOGEL PI IND STRL 6.5 (GLOVE) ×2 IMPLANT
GLOVE BIOGEL PI INDICATOR 6.5 (GLOVE) ×2
GLOVE ECLIPSE 6.5 STRL STRAW (GLOVE) ×2 IMPLANT
GLOVE EXAM NITRILE LRG STRL (GLOVE) IMPLANT
GLOVE EXAM NITRILE XL STR (GLOVE) IMPLANT
GLOVE EXAM NITRILE XS STR PU (GLOVE) IMPLANT
GLOVE SURG SS PI 6.0 STRL IVOR (GLOVE) ×4 IMPLANT
GLOVE SURG SS PI 6.5 STRL IVOR (GLOVE) ×6 IMPLANT
GOWN STRL REUS W/ TWL LRG LVL3 (GOWN DISPOSABLE) ×3 IMPLANT
GOWN STRL REUS W/ TWL XL LVL3 (GOWN DISPOSABLE) IMPLANT
GOWN STRL REUS W/TWL 2XL LVL3 (GOWN DISPOSABLE) IMPLANT
GOWN STRL REUS W/TWL LRG LVL3 (GOWN DISPOSABLE) ×6
GOWN STRL REUS W/TWL XL LVL3 (GOWN DISPOSABLE)
KIT BASIN OR (CUSTOM PROCEDURE TRAY) ×2 IMPLANT
KIT TURNOVER KIT B (KITS) ×2 IMPLANT
NEEDLE HYPO 25X1 1.5 SAFETY (NEEDLE) ×2 IMPLANT
NEEDLE SPNL 18GX3.5 QUINCKE PK (NEEDLE) ×2 IMPLANT
NS IRRIG 1000ML POUR BTL (IV SOLUTION) ×2 IMPLANT
OIL CARTRIDGE MAESTRO DRILL (MISCELLANEOUS) ×2
PACK LAMINECTOMY NEURO (CUSTOM PROCEDURE TRAY) ×2 IMPLANT
PAD ARMBOARD 7.5X6 YLW CONV (MISCELLANEOUS) ×6 IMPLANT
RUBBERBAND STERILE (MISCELLANEOUS) ×4 IMPLANT
SPONGE LAP 4X18 X RAY DECT (DISPOSABLE) IMPLANT
SPONGE SURGIFOAM ABS GEL SZ50 (HEMOSTASIS) ×2 IMPLANT
STRIP CLOSURE SKIN 1/2X4 (GAUZE/BANDAGES/DRESSINGS) IMPLANT
SUT VIC AB 0 CT1 18XCR BRD8 (SUTURE) ×1 IMPLANT
SUT VIC AB 0 CT1 8-18 (SUTURE) ×2
SUT VIC AB 2-0 CT1 18 (SUTURE) ×4 IMPLANT
SUT VIC AB 3-0 SH 8-18 (SUTURE) ×2 IMPLANT
TOWEL GREEN STERILE (TOWEL DISPOSABLE) ×2 IMPLANT
TOWEL GREEN STERILE FF (TOWEL DISPOSABLE) ×2 IMPLANT
WATER STERILE IRR 1000ML POUR (IV SOLUTION) ×2 IMPLANT

## 2017-11-02 NOTE — H&P (Signed)
  BP (!) 132/57   Pulse 76   Temp 97.6 F (36.4 C) (Oral)   Resp 18   Ht 5' 11.5" (1.816 m)   Wt 114.3 kg (252 lb)   SpO2 99%   BMI 34.66 kg/m  Seth Young comes in today for evaluation of pain and weakness that he has in his left lower extremity. He said he has had this for somewhere between 2-1/2 and 6 weeks or so; it is hard to get him to pin down on a date, but he has needed to use a cane recently, as he has been quite weak in his legs. Going up stairs is very difficult. He has very poor balance. He has a history of vertigo and did take a fall then, but nothing so recent.  He says he is having pain in the legs and back, weakness in the left leg, numbness and tingling in the left foot. No recent weight gain or loss. He does describe peripheral neuropathy in his hands bilaterally. SOCIAL HISTORY: He is 77 years of age. He is self-employed. He is a former smoker. No history of substance abuse. He does not use alcohol. He is right handed. PAST MEDICAL HISTORY: Significant for hypertension and diabetes. REVIEW OF SYSTEMS: Positive for glaucoma, cataracts, balance problems, chest pain, leg pain with walking, abdominal pain, arthritis, leg weakness, back pain, leg pain at rest, joint pain, skin disease, anxiety. FAMILY HISTORY: Mother is 47 and has diabetes. Father died at age 76 secondary to heart disease. PHYSICAL EXAMINATION: He is alert and oriented x4. He answers all questions appropriately. Great deal of difficulty going from sitting to standing position. Significant weakness in the hip flexors on the left and quadriceps on the left of about 4/5; otherwise, normal strength in the lower extremities. Reflexes 2+ at the ankles and 1+ at the knees. Normal reflexes in the upper extremities, biceps, triceps, brachioradialis. Proprioception is intact in the upper and lower extremities. Memory, language, attention span, and fund of knowledge normal. Speech is clear and fluent. Hearing is intact to  voice. Uvula elevates to midline. Shoulder shrug is normal. Tongue protrudes in the midline. Pupils equal, round, and reactive to light. IMAGING: MRI lumbar spine reviewed shows a disc herniation at L3-L4 eccentric to the left side. Shows his old laminectomy at L4-L5 on the left; that looks good. He has a right-sided disc protrusion at L5-S1. The conus is normal. Cauda equina is normal. IMPRESSION: Since he has pain in his left side, there is nothing that needs to be done to the right disc herniation at L5-S1. I would plan on doing L3-L4 on the left. He says he just wants something definitive and he wants something done as quickly as possible; this has been quite debilitating for him.

## 2017-11-02 NOTE — Op Note (Signed)
11/02/2017  1:43 PM  PATIENT:  Seth Young  77 y.o. male  With a disc herniation at L3/4 eccentric to the left PRE-OPERATIVE DIAGNOSIS:  DISC DISPLACEMENT, LUMBAR 3/4 left POST-OPERATIVE DIAGNOSIS:  DISC DISPLACEMENT, LUMBAR  PROCEDURE:  Procedure(s): MICRODISCECTOMY LUMBAR THREE- LUMBAR FOUR, LEFT  SURGEON:   Surgeon(s): Ashok Pall, MD Eustace Moore, MD  ASSISTANTS:Jones, Shanon Brow  ANESTHESIA:   general  EBL:  Total I/O In: 1300 [I.V.:1300] Out: 25 [Blood:25]  BLOOD ADMINISTERED:none  CELL SAVER GIVEN:none  COUNT:per nursing  DRAINS: none   SPECIMEN:  No Specimen  DICTATION: Mr. Record was taken to the operating room, intubated and placed under a general anesthetic without difficulty. He was positioned prone on a Wilson frame with all pressure points padded. His back was prepped and draped in a sterile manner. I opened the skin with a 10 blade and carried the dissection down to the thoracolumbar fascia. I used both sharp dissection and the monopolar cautery to expose the lamina of L3, and L4. I confirmed my location with an intraoperative xray.  I used the drill, Kerrison punches, and curettes to perform a semihemilaminectomy of L3. I used the punches to remove the ligamentum flavum to expose the thecal sac. I brought the microscope into the operative field and with Dr.Jones' assistance we started our decompression of the spinal canal, thecal sac and L3, and L4 root(s). I cauterized epidural veins overlying the disc space then divided them sharply. I opened the disc space with a 15 blade and proceeded with the discectomy. I used pituitary rongeurs, curettes, and other instruments to remove disc material. After the discectomy was completed we inspected the L3, and L4 nerve roots and felt they were well decompressed. I explored rostrally, laterally, medially, and caudally and was satisfied with the decompression. I irrigated the wound, then closed in layers. I approximated the  thoracolumbar fascia, subcutaneous, and subcuticular planes with vicryl sutures. I used dermabond for a sterile dressing.   PLAN OF CARE: Admit for overnight observation  PATIENT DISPOSITION:  PACU - hemodynamically stable.   Delay start of Pharmacological VTE agent (>24hrs) due to surgical blood loss or risk of bleeding:  yes

## 2017-11-02 NOTE — Transfer of Care (Signed)
Immediate Anesthesia Transfer of Care Note  Patient: Seth Young  Procedure(s) Performed: MICRODISCECTOMY LUMBAR THREE- LUMBAR FOUR, LEFT (Left )  Patient Location: PACU  Anesthesia Type:General  Level of Consciousness: awake, alert  and oriented  Airway & Oxygen Therapy: Patient Spontanous Breathing and Patient connected to nasal cannula oxygen  Post-op Assessment: Report given to RN, Post -op Vital signs reviewed and stable and Patient moving all extremities X 4  Post vital signs: Reviewed and stable  Last Vitals:  Vitals Value Taken Time  BP 157/90 11/02/2017  1:27 PM  Temp 36.3 C 11/02/2017  1:22 PM  Pulse 83 11/02/2017  1:27 PM  Resp 6 11/02/2017  1:27 PM  SpO2 100 % 11/02/2017  1:27 PM  Vitals shown include unvalidated device data.  Last Pain:  Vitals:   11/02/17 1322  TempSrc:   PainSc: (P) 5       Patients Stated Pain Goal: 3 (01/30/75 2831)  Complications: No apparent anesthesia complications

## 2017-11-02 NOTE — Anesthesia Procedure Notes (Signed)
Procedure Name: Intubation Date/Time: 11/02/2017 11:25 AM Performed by: Mariea Clonts, CRNA Pre-anesthesia Checklist: Patient identified, Emergency Drugs available, Suction available and Patient being monitored Patient Re-evaluated:Patient Re-evaluated prior to induction Oxygen Delivery Method: Circle System Utilized Preoxygenation: Pre-oxygenation with 100% oxygen Induction Type: IV induction Ventilation: Mask ventilation without difficulty Laryngoscope Size: Mac and 4 Grade View: Grade I Tube type: Oral Tube size: 8.0 mm Number of attempts: 1 Airway Equipment and Method: Stylet and Oral airway Placement Confirmation: ETT inserted through vocal cords under direct vision,  positive ETCO2 and breath sounds checked- equal and bilateral Tube secured with: Tape Dental Injury: Teeth and Oropharynx as per pre-operative assessment

## 2017-11-02 NOTE — Anesthesia Preprocedure Evaluation (Signed)
Anesthesia Evaluation  Patient identified by MRN, date of birth, ID band Patient awake    Reviewed: Allergy & Precautions, NPO status , Patient's Chart, lab work & pertinent test results  Airway Mallampati: II  TM Distance: >3 FB     Dental  (+) Edentulous Upper, Edentulous Lower   Pulmonary former smoker,    breath sounds clear to auscultation       Cardiovascular hypertension,  Rhythm:Regular Rate:Normal     Neuro/Psych    GI/Hepatic   Endo/Other  diabetes  Renal/GU      Musculoskeletal   Abdominal   Peds  Hematology   Anesthesia Other Findings   Reproductive/Obstetrics                             Anesthesia Physical Anesthesia Plan  ASA: III  Anesthesia Plan: General   Post-op Pain Management:    Induction: Intravenous  PONV Risk Score and Plan: Ondansetron  Airway Management Planned: Oral ETT  Additional Equipment:   Intra-op Plan:   Post-operative Plan: Extubation in OR  Informed Consent: I have reviewed the patients History and Physical, chart, labs and discussed the procedure including the risks, benefits and alternatives for the proposed anesthesia with the patient or authorized representative who has indicated his/her understanding and acceptance.     Plan Discussed with: CRNA and Anesthesiologist  Anesthesia Plan Comments:         Anesthesia Quick Evaluation

## 2017-11-02 NOTE — Progress Notes (Signed)
PHARMACY NOTE: SGLT2 INHIBITOR INTERRUPTION  Per medication safety policy approved by P&[employer], oral diabetic SGLT?2 inhibitor therapy (canagliflozin) has been interrupted during hospitalization.   May reinitiate on discharge if appropriate.   Montie Swiderski D. Breshay Ilg, PharmD, BCPS Clinical Pharmacist 11/02/2017 2:46 PM

## 2017-11-02 NOTE — Evaluation (Signed)
Physical Therapy Evaluation Patient Details Name: Seth Young MRN: [id number] DOB: [birth year] Today's Date: 11/02/2017   History of Present Illness  Pt is a 77 y/o male s/p L3-4 microdiscectomy. PMH includes HTN and DM.   Clinical Impression  Patient is s/p above surgery resulting in the deficits listed below (see PT Problem List). Pt requiring min to min guard A for mobility with RW. Pt reporting improved gait tolerance from prior to surgery. Educated about use of RW at home, back precautions, and generalized walking program to perform at home. Patient will benefit from skilled PT to increase their independence and safety with mobility (while adhering to their precautions) to allow discharge to the venue listed below.     Follow Up Recommendations No PT follow up;Supervision for mobility/OOB    Equipment Recommendations  Rolling walker with 5" wheels    Recommendations for Other Services OT consult     Precautions / Restrictions Precautions Precautions: Back;Fall Precaution Booklet Issued: Yes (comment) Precaution Comments: Reviewed back precautions with pt. Hx of falls at home  Restrictions Weight Bearing Restrictions: No      Mobility  Bed Mobility Overal bed mobility: Needs Assistance Bed Mobility: Rolling;Sidelying to Sit;Sit to Sidelying Rolling: Supervision Sidelying to sit: Min guard     Sit to sidelying: Supervision General bed mobility comments: Supervision to min guard to ensure log roll technique. Required verbal cues for sequencing to ensure log roll technique.   Transfers Overall transfer level: Needs assistance Equipment used: Rolling walker (2 wheeled) Transfers: Sit to/from Stand Sit to Stand: Min assist;From elevated surface         General transfer comment: Attempted to stand X 2 from lower surface and unable, therefore elevated bed height. Required min A for lift assist and steadying.   Ambulation/Gait Ambulation/Gait assistance: Min  guard Ambulation Distance (Feet): 300 Feet Assistive device: Rolling walker (2 wheeled) Gait Pattern/deviations: Step-through pattern;Decreased stride length;Trunk flexed Gait velocity: Decreased  Gait velocity interpretation: Below normal speed for age/gender General Gait Details: Slow, slightly unsteady gait. Min guard for safety. Required verbal cues for upright posture.  Educated about use of RW at home to assist in decreasing fall risk, however, pt slightly resistive to education. Educated about generalized walking program to perform at home.   Stairs            Wheelchair Mobility    Modified Rankin (Stroke Patients Only)       Balance Overall balance assessment: Needs assistance Sitting-balance support: No upper extremity supported;Feet supported Sitting balance-Leahy Scale: Good     Standing balance support: Bilateral upper extremity supported;During functional activity Standing balance-Leahy Scale: Poor Standing balance comment: Reliant on BUE support.                              Pertinent Vitals/Pain Pain Assessment: 0-10 Pain Score: 2  Pain Location: back  Pain Descriptors / Indicators: Aching;Operative site guarding Pain Intervention(s): Limited activity within patient's tolerance;Monitored during session;Repositioned    Home Living Family/patient expects to be discharged to:: Private residence Living Arrangements: Spouse/significant other Available Help at Discharge: Family;Available 24 hours/day Type of Home: House Home Access: Stairs to enter Entrance Stairs-Rails: Right;Left;Can reach both Entrance Stairs-Number of Steps: 3 Home Layout: One level Home Equipment: Cane - single point;Bedside commode;Shower seat      Prior Function Level of Independence: Independent with assistive device(s)         Comments: Using cane for ambulation or  furniture walking      Hand Dominance        Extremity/Trunk Assessment   Upper Extremity  Assessment Upper Extremity Assessment: Defer to OT evaluation(bilat shoulder pain )    Lower Extremity Assessment Lower Extremity Assessment: LLE deficits/detail;Generalized weakness LLE Deficits / Details: Reports LLE pain improved.     Cervical / Trunk Assessment Cervical / Trunk Assessment: Other exceptions Cervical / Trunk Exceptions: s/p back surgery   Communication   Communication: No difficulties  Cognition Arousal/Alertness: Awake/alert Behavior During Therapy: WFL for tasks assessed/performed Overall Cognitive Status: Within Functional Limits for tasks assessed                                        General Comments General comments (skin integrity, edema, etc.): Pt's wife present in room.     Exercises     Assessment/Plan    PT Assessment Patient needs continued PT services  PT Problem List Decreased strength;Decreased balance;Decreased mobility;Decreased knowledge of use of DME;Decreased knowledge of precautions;Pain       PT Treatment Interventions DME instruction;Gait training;Stair training;Functional mobility training;Therapeutic activities;Therapeutic exercise;Balance training;Neuromuscular re-education;Patient/family education    PT Goals (Current goals can be found in the Care Plan section)  Acute Rehab PT Goals Patient Stated Goal: to go home  PT Goal Formulation: With patient Time For Goal Achievement: 11/16/17 Potential to Achieve Goals: Good    Frequency 7X/week   Barriers to discharge        Co-evaluation               AM-PAC PT "6 Clicks" Daily Activity  Outcome Measure Difficulty turning over in bed (including adjusting bedclothes, sheets and blankets)?: A Little Difficulty moving from lying on back to sitting on the side of the bed? : Unable Difficulty sitting down on and standing up from a chair with arms (e.g., wheelchair, bedside commode, etc,.)?: Unable Help needed moving to and from a bed to chair (including a  wheelchair)?: A Little Help needed walking in hospital room?: A Little Help needed climbing 3-5 steps with a railing? : A Lot 6 Click Score: 13    End of Session Equipment Utilized During Treatment: Gait belt Activity Tolerance: Patient tolerated treatment well Patient left: in bed;with call bell/phone within reach;with family/visitor present Nurse Communication: Mobility status PT Visit Diagnosis: Unsteadiness on feet (R26.81);Other abnormalities of gait and mobility (R26.89);Muscle weakness (generalized) (M62.81);History of falling (Z91.81)    Time: 5170-0174 PT Time Calculation (min) (ACUTE ONLY): 25 min   Charges:   PT Evaluation $PT Eval Low Complexity: 1 Low PT Treatments $Gait Training: 8-22 mins   PT G Codes:        Leighton Ruff, PT, DPT  Acute Rehabilitation Services  Pager: [phone]   Rudean Hitt 11/02/2017, 4:47 PM

## 2017-11-02 NOTE — Anesthesia Postprocedure Evaluation (Signed)
Anesthesia Post Note  Patient: Seth Young  Procedure(s) Performed: MICRODISCECTOMY LUMBAR THREE- LUMBAR FOUR, LEFT (Left )     Patient location during evaluation: PACU Anesthesia Type: General Level of consciousness: awake and alert Pain management: pain level controlled Vital Signs Assessment: post-procedure vital signs reviewed and stable Respiratory status: spontaneous breathing, nonlabored ventilation, respiratory function stable and patient connected to nasal cannula oxygen Cardiovascular status: blood pressure returned to baseline and stable Postop Assessment: no apparent nausea or vomiting Anesthetic complications: no    Last Vitals:  Vitals:   11/02/17 1354 11/02/17 1402  BP: (!) 154/77 (!) 145/65  Pulse: 79 79  Resp: 10 (!) 9  Temp:  36.7 C  SpO2: 95% 99%    Last Pain:  Vitals:   11/02/17 1402  TempSrc:   PainSc: 2                  Uvaldo Rybacki COKER

## 2017-11-03 ENCOUNTER — Encounter (HOSPITAL_COMMUNITY): Payer: Self-pay | Admitting: Neurosurgery

## 2017-11-03 DIAGNOSIS — E1142 Type 2 diabetes mellitus with diabetic polyneuropathy: Secondary | ICD-10-CM | POA: Diagnosis not present

## 2017-11-03 DIAGNOSIS — Z87891 Personal history of nicotine dependence: Secondary | ICD-10-CM | POA: Diagnosis not present

## 2017-11-03 DIAGNOSIS — Z7982 Long term (current) use of aspirin: Secondary | ICD-10-CM | POA: Diagnosis not present

## 2017-11-03 DIAGNOSIS — G4733 Obstructive sleep apnea (adult) (pediatric): Secondary | ICD-10-CM | POA: Diagnosis not present

## 2017-11-03 DIAGNOSIS — I082 Rheumatic disorders of both aortic and tricuspid valves: Secondary | ICD-10-CM | POA: Diagnosis not present

## 2017-11-03 DIAGNOSIS — M5126 Other intervertebral disc displacement, lumbar region: Secondary | ICD-10-CM | POA: Diagnosis not present

## 2017-11-03 DIAGNOSIS — Z882 Allergy status to sulfonamides status: Secondary | ICD-10-CM | POA: Diagnosis not present

## 2017-11-03 DIAGNOSIS — K219 Gastro-esophageal reflux disease without esophagitis: Secondary | ICD-10-CM | POA: Diagnosis not present

## 2017-11-03 DIAGNOSIS — Z79899 Other long term (current) drug therapy: Secondary | ICD-10-CM | POA: Diagnosis not present

## 2017-11-03 DIAGNOSIS — E1165 Type 2 diabetes mellitus with hyperglycemia: Secondary | ICD-10-CM | POA: Diagnosis not present

## 2017-11-03 DIAGNOSIS — I440 Atrioventricular block, first degree: Secondary | ICD-10-CM | POA: Diagnosis not present

## 2017-11-03 DIAGNOSIS — I10 Essential (primary) hypertension: Secondary | ICD-10-CM | POA: Diagnosis not present

## 2017-11-03 DIAGNOSIS — E785 Hyperlipidemia, unspecified: Secondary | ICD-10-CM | POA: Diagnosis not present

## 2017-11-03 DIAGNOSIS — Z886 Allergy status to analgesic agent status: Secondary | ICD-10-CM | POA: Diagnosis not present

## 2017-11-03 DIAGNOSIS — I2510 Atherosclerotic heart disease of native coronary artery without angina pectoris: Secondary | ICD-10-CM | POA: Diagnosis not present

## 2017-11-03 DIAGNOSIS — Z955 Presence of coronary angioplasty implant and graft: Secondary | ICD-10-CM | POA: Diagnosis not present

## 2017-11-03 DIAGNOSIS — Z885 Allergy status to narcotic agent status: Secondary | ICD-10-CM | POA: Diagnosis not present

## 2017-11-03 LAB — GLUCOSE, CAPILLARY
Glucose-Capillary: 145 mg/dL — ABNORMAL HIGH (ref 65–99)
Glucose-Capillary: 163 mg/dL — ABNORMAL HIGH (ref 65–99)

## 2017-11-03 MED FILL — Thrombin For Soln 5000 Unit: CUTANEOUS | Qty: 2 | Status: AC

## 2017-11-03 NOTE — Evaluation (Signed)
Occupational Therapy Evaluation and Discharge Patient Details Name: Seth Young MRN: [id number] DOB: 05/10/1941 Today's Date: 11/03/2017    History of Present Illness Pt is a 77 y/o male s/p L3-4 microdiscectomy. PMH includes HTN and DM.    Clinical Impression   PTA Pt independent in ADL and mobility. Pt is currently overall supervision with ADL and functional transfers. Back handout provided and reviewed adls in detail. Pt educated on: set an alarm at night for medication, avoid sitting for long periods of time, correct bed positioning for sleeping, correct sequence for bed mobility, compensatory strategies for ADL, AE for LB (specifically long handle shoe horn and sponge - which patient has at home) avoiding lifting more than 5 pounds and never wash directly over incision. All education is complete and patient indicates understanding. OT to sign off at this time. Thank you for the opportunity to serve this patient.       Follow Up Recommendations  Supervision - Intermittent;No OT follow up    Equipment Recommendations  None recommended by OT    Recommendations for Other Services       Precautions / Restrictions Precautions Precautions: Back;Fall Precaution Booklet Issued: Yes (comment) Precaution Comments: Reviewed back precautions with pt. Hx of falls at home  Restrictions Weight Bearing Restrictions: No      Mobility Bed Mobility Overal bed mobility: Needs Assistance Bed Mobility: Rolling;Sidelying to Sit Rolling: Supervision Sidelying to sit: Min guard       General bed mobility comments: vc for sequencing, HOB flat and no rail used  Transfers Overall transfer level: Needs assistance Equipment used: Rolling walker (2 wheeled) Transfers: Sit to/from Stand Sit to Stand: Supervision         General transfer comment: VC's for hand placement on seated surface for safety. Close supervision provided but no hands-on guarding required.     Balance Overall  balance assessment: Needs assistance Sitting-balance support: No upper extremity supported;Feet supported Sitting balance-Leahy Scale: Good     Standing balance support: Bilateral upper extremity supported;During functional activity Standing balance-Leahy Scale: Fair                             ADL either performed or assessed with clinical judgement   ADL Overall ADL's : Needs assistance/impaired Eating/Feeding: Modified independent   Grooming: Modified independent;Cueing for compensatory techniques Grooming Details (indicate cue type and reason): educated in compensatory strategies Upper Body Bathing: Supervision/ safety   Lower Body Bathing: Modified independent;Sit to/from stand;With adaptive equipment Lower Body Bathing Details (indicate cue type and reason): Pt has long handle sponge Upper Body Dressing : Supervision/safety   Lower Body Dressing: Moderate assistance;With caregiver independent assisting;Sit to/from stand Lower Body Dressing Details (indicate cue type and reason): mod A to don shoes, able to cross feet to knees to don socks, able to don pants and underwear with supervision Toilet Transfer: Supervision/safety;Ambulation   Toileting- Clothing Manipulation and Hygiene: Min guard   Tub/ Banker: Walk-in shower;Supervision/safety   Functional mobility during ADLs: Supervision/safety(no DME used in room)       Vision         Perception     Praxis      Pertinent Vitals/Pain Pain Assessment: 0-10 Faces Pain Scale: Hurts little more Pain Location: back; chest tightness with activity (baseline) Pain Descriptors / Indicators: Aching;Operative site guarding Pain Intervention(s): Monitored during session;Repositioned     Hand Dominance Right   Extremity/Trunk Assessment Upper Extremity Assessment Upper  Extremity Assessment: Overall WFL for tasks assessed   Lower Extremity Assessment Lower Extremity Assessment: Defer to PT  evaluation   Cervical / Trunk Assessment Cervical / Trunk Assessment: Other exceptions Cervical / Trunk Exceptions: s/p back surgery    Communication Communication Communication: No difficulties   Cognition Arousal/Alertness: Awake/alert Behavior During Therapy: WFL for tasks assessed/performed Overall Cognitive Status: Within Functional Limits for tasks assessed                                     General Comments       Exercises     Shoulder Instructions      Home Living Family/patient expects to be discharged to:: Private residence Living Arrangements: Spouse/significant other Available Help at Discharge: Family;Available 24 hours/day Type of Home: House Home Access: Stairs to enter [employer] of Steps: 3 Entrance Stairs-Rails: Right;Left;Can reach both Home Layout: One level     Bathroom Shower/Tub: Occupational psychologist: Standard     Home Equipment: Cane - single point;Bedside commode;Shower seat          Prior Functioning/Environment Level of Independence: Independent with assistive device(s)        Comments: Using cane for ambulation or furniture walking         OT Problem List:        OT Treatment/Interventions:      OT Goals(Current goals can be found in the care plan section) Acute Rehab OT Goals Patient Stated Goal: to go home  OT Goal Formulation: With patient Time For Goal Achievement: 11/17/17 Potential to Achieve Goals: Good  OT Frequency:     Barriers to D/C:            Co-evaluation              AM-PAC PT "6 Clicks" Daily Activity     Outcome Measure Help from another person eating meals?: None Help from another person taking care of personal grooming?: None Help from another person toileting, which includes using toliet, bedpan, or urinal?: A Little Help from another person bathing (including washing, rinsing, drying)?: A Little Help from another person to put on and taking off  regular upper body clothing?: None Help from another person to put on and taking off regular lower body clothing?: A Lot 6 Click Score: 20   End of Session Nurse Communication: Mobility status  Activity Tolerance: Patient tolerated treatment well Patient left: in chair;with call bell/phone within reach;with nursing/sitter in room                   Time: 0905-0927 OT Time Calculation (min): 22 min Charges:  OT General Charges $OT Visit: 1 Visit OT Evaluation $OT Eval Low Complexity: 1 Low G-Codes:     Seth Young [phone]  Seth Young 11/03/2017, 10:58 AM

## 2017-11-03 NOTE — Progress Notes (Signed)
Patient alert and oriented, mae's well, voiding adequate amount of urine, swallowing without difficulty, no c/o pain at time of discharge. Patient discharged home with family. Script and discharged instructions given to patient. Patient and family stated understanding of instructions given. Patient has an appointment with Dr. Cabbell   

## 2017-11-03 NOTE — Discharge Instructions (Signed)
Lumbar Discectomy °Care After °A discectomy involves removal of discmaterial (the cartilage-like structures located between the bones of the back). It is done to relieve pressure on nerve roots. It can be used as a treatment for a back problem. The time in surgery depends on the findings in surgery and what is necessary to correct the problems. °HOME CARE INSTRUCTIONS  °· Check the cut (incision) made by the surgeon twice a day for signs of infection. Some signs of infection may include:  °· A foul smelling, greenish or yellowish discharge from the wound.  °· Increased pain.  °· Increased redness over the incision (operative) site.  °· The skin edges may separate.  °· Flu-like symptoms (problems).  °· A temperature above 101.5° F (38.6° C).  °· Change your bandages in about 24 to 36 hours following surgery or as directed.  °· You may shower tomrrow.  Avoid bathtubs, swimming pools and hot tubs for three weeks or until your incision has healed completely. °· Follow your doctor's instructions as to safe activities, exercises, and physical therapy.  °· Weight reduction may be beneficial if you are overweight.  °· Daily exercise is helpful to prevent the return of problems. Walking is permitted. You may use a treadmill without an incline. Cut down on activities and exercise if you have discomfort. You may also go up and down stairs as much as you can tolerate.  °· DO NOT lift anything heavier than 10 to 15 lbs. Avoid bending or twisting at the waist. Always bend your knees when lifting.  °· Maintain strength and range of motion as instructed.  °· Do not drive for 10 days, or as directed by your doctors. You may be a passenger . Lying back in the passenger seat may be more comfortable for you. Always wear a seatbelt.  °· Limit your sitting in a regular chair to 20 to 30 minutes at a time. There are no limitations for sitting in a recliner. You should lie down or walk in between sitting periods.  °· Only take  over-the-counter or prescription medicines for pain, discomfort, or fever as directed by your caregiver.  °SEEK MEDICAL CARE IF:  °· There is increased bleeding (more than a small spot) from the wound.  °· You notice redness, swelling, or increasing pain in the wound.  °· Pus is coming from wound.  °· You develop an unexplained oral temperature above 102° F (38.9° C) develops.  °· You notice a foul smell coming from the wound or dressing.  °· You have increasing pain in your wound.  °SEEK IMMEDIATE MEDICAL CARE IF:  °· You develop a rash.  °· You have difficulty breathing.  °· You develop any allergic problems to medicines given.  °Document Released: 06/30/2004 Document Revised: 07/15/2011 Document Reviewed: 10/19/2007 °ExitCare® Patient Information °

## 2017-11-03 NOTE — Progress Notes (Signed)
Physical Therapy Treatment Patient Details Name: Seth Young MRN: [id number] DOB: Jul 03, 1941 Today's Date: 11/03/2017    History of Present Illness Pt is a 77 y/o male s/p L3-4 microdiscectomy. PMH includes HTN and DM.     PT Comments    Pt progressing towards physical therapy goals. Was able to control chest tightness during activity (baseline - RN aware) with decreased gait speed, decreased distance, and standing rest breaks to practice pursed-lip breathing. Pt was educated to monitor these factors at home as well when ambulating. Pt was able to tolerate ambulation without an AD for a short distance. He is appropriate for [facility] use (has one at home) if he does not feel comfortable without UE support at home. Pt was educated on car transfer, recommended activity progression, back precautions, and safe mobility at home. Will continue to follow and progress as able per POC.   Follow Up Recommendations  No PT follow up;Supervision for mobility/OOB     Equipment Recommendations  Rolling walker with 5" wheels    Recommendations for Other Services       Precautions / Restrictions Precautions Precautions: Back;Fall Precaution Booklet Issued: Yes (comment) Precaution Comments: Reviewed back precautions with pt. Hx of falls at home  Restrictions Weight Bearing Restrictions: No    Mobility  Bed Mobility               General bed mobility comments: Pt sitting EOB when PT arrived.   Transfers Overall transfer level: Needs assistance Equipment used: Rolling walker (2 wheeled) Transfers: Sit to/from Stand Sit to Stand: Supervision         General transfer comment: VC's for hand placement on seated surface for safety. Close supervision provided but no hands-on guarding required.   Ambulation/Gait Ambulation/Gait assistance: Supervision Ambulation Distance (Feet): 250 Feet Assistive device: Rolling walker (2 wheeled);None Gait Pattern/deviations: Step-through  pattern;Decreased stride length;Trunk flexed Gait velocity: Decreased  Gait velocity interpretation: Below normal speed for age/gender General Gait Details: Prior to gait training pt reports chest tightness the last time he was ambulating in the hall. He states this is baseline for him. He also states he was ambulating very fast and going a longer distance. Pt was instructed to slow pace, decrease distance, and take standing rest breaks to purse-lip breathe if he feels any chest tightness during gait training today. Pt states taking these measures significantly improved his symptoms. Attempted some distance without AD and was able to ambulate fairly well without RW.    Stairs Stairs: Yes   Stair Management: One rail Right;Step to pattern;Forwards Number of Stairs: 3 General stair comments: VC's for sequencing and general safety.   Wheelchair Mobility    Modified Rankin (Stroke Patients Only)       Balance Overall balance assessment: Needs assistance Sitting-balance support: No upper extremity supported;Feet supported Sitting balance-Leahy Scale: Good     Standing balance support: Bilateral upper extremity supported;During functional activity Standing balance-Leahy Scale: Fair                              Cognition Arousal/Alertness: Awake/alert Behavior During Therapy: WFL for tasks assessed/performed Overall Cognitive Status: Within Functional Limits for tasks assessed                                        Exercises      General Comments  Pertinent Vitals/Pain Pain Assessment: Faces Faces Pain Scale: Hurts little more Pain Location: back; chest tightness with activity (baseline) Pain Descriptors / Indicators: Aching;Operative site guarding Pain Intervention(s): Limited activity within patient's tolerance;Monitored during session;Repositioned    Home Living                      Prior Function            PT Goals  (current goals can now be found in the care plan section) Acute Rehab PT Goals Patient Stated Goal: to go home  PT Goal Formulation: With patient Time For Goal Achievement: 11/16/17 Potential to Achieve Goals: Good Progress towards PT goals: Progressing toward goals    Frequency    7X/week      PT Plan Current plan remains appropriate    Co-evaluation              AM-PAC PT "6 Clicks" Daily Activity  Outcome Measure  Difficulty turning over in bed (including adjusting bedclothes, sheets and blankets)?: A Little Difficulty moving from lying on back to sitting on the side of the bed? : A Little Difficulty sitting down on and standing up from a chair with arms (e.g., wheelchair, bedside commode, etc,.)?: A Little Help needed moving to and from a bed to chair (including a wheelchair)?: A Little Help needed walking in hospital room?: A Little Help needed climbing 3-5 steps with a railing? : A Little 6 Click Score: 18    End of Session Equipment Utilized During Treatment: Gait belt Activity Tolerance: Patient tolerated treatment well Patient left: in bed;with call bell/phone within reach;with family/visitor present Nurse Communication: Mobility status PT Visit Diagnosis: Unsteadiness on feet (R26.81);Other abnormalities of gait and mobility (R26.89);Muscle weakness (generalized) (M62.81);History of falling (Z91.81)     Time: 6144-3154 PT Time Calculation (min) (ACUTE ONLY): 24 min  Charges:  $Gait Training: 23-37 mins                    G Codes:       Rolinda Roan, PT, DPT Acute Rehabilitation Services Pager: [phone]    Thelma Comp 11/03/2017, 10:03 AM

## 2017-11-29 ENCOUNTER — Encounter: Payer: Self-pay | Admitting: Cardiology

## 2017-12-05 ENCOUNTER — Other Ambulatory Visit: Payer: Self-pay | Admitting: Cardiology

## 2017-12-05 DIAGNOSIS — R0789 Other chest pain: Principal | ICD-10-CM

## 2017-12-05 DIAGNOSIS — R079 Chest pain, unspecified: Secondary | ICD-10-CM

## 2017-12-12 ENCOUNTER — Ambulatory Visit: Payer: Medicare Other | Admitting: Cardiology

## 2017-12-12 ENCOUNTER — Encounter: Payer: Self-pay | Admitting: Cardiology

## 2017-12-12 VITALS — BP 92/60 | HR 73 | Ht 71.5 in | Wt 249.2 lb

## 2017-12-12 DIAGNOSIS — I2583 Coronary atherosclerosis due to lipid rich plaque: Secondary | ICD-10-CM | POA: Diagnosis not present

## 2017-12-12 DIAGNOSIS — E7800 Pure hypercholesterolemia, unspecified: Secondary | ICD-10-CM

## 2017-12-12 DIAGNOSIS — I2510 Atherosclerotic heart disease of native coronary artery without angina pectoris: Secondary | ICD-10-CM | POA: Diagnosis not present

## 2017-12-12 DIAGNOSIS — I10 Essential (primary) hypertension: Secondary | ICD-10-CM

## 2017-12-12 DIAGNOSIS — I209 Angina pectoris, unspecified: Secondary | ICD-10-CM | POA: Diagnosis not present

## 2017-12-12 DIAGNOSIS — E119 Type 2 diabetes mellitus without complications: Secondary | ICD-10-CM | POA: Diagnosis not present

## 2017-12-12 NOTE — Patient Instructions (Signed)
Medication Instructions:  The current medical regimen is effective;  continue present plan and medications.  Follow-Up: Follow up in 6 months with Laura Ingold, NP.  You will receive a letter in the mail 2 months before you are due.  Please call us when you receive this letter to schedule your follow up appointment.  Follow up in 1 year with Dr. Skains.  You will receive a letter in the mail 2 months before you are due.  Please call us when you receive this letter to schedule your follow up appointment.  If you need a refill on your cardiac medications before your next appointment, please call your pharmacy.  Thank you for choosing [facility]!!     

## 2017-12-12 NOTE — Progress Notes (Signed)
Cardiology Office Note    Date:  12/12/2017   ID:  Seth Young, DOB 19-Jun-1941, MRN [id number]  PCP:  Burnard Bunting, MD  Cardiologist:   Candee Furbish, MD     History of Present Illness:  Seth Young is a 77 y.o. male former patient of Dr. Sherryl Barters with diabetes, hypertension, hyperlipidemia, coronary artery disease here for follow-up  In 2015 he underwent heart catheterization after an abnormal nuclear stress test. Fairly diffuse disease. Normal ejection fraction 55%. Culprit vessel was felt to be the obtuse marginal 1. This was treated with PCI and DES. See some catheterization report summarized as below  Vertigo all summer 2017 - no CVA. Still with it. Cut back on exercise. Swim a little.   Felt a pressure in chest similar to GERD prior to stent. Dr. [name] ECG.  Watch diet. Pain certainly has GERD-like suggestion such as when laying down feels worse. He however is quite concerned especially with the increased shortness of breath with exercise. He is worried that his chest pressure may be worsening coronary disease.  Cardiac catheterization from 20152 reviewed. OM stent patent. PDA did not appear graftable. Fairly diffuse moderate CAD.   Swimming, exercise bicycle. Vertigo.   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   Surprised by CAD. Does not have much confidence in nuclear stress test. We discussed.   06/09/17-we have been increasing his antianginals, isosorbide to 90, metoprolol 50 twice a day.  Once again last catheterization 2015, stent to first obtuse marginal.  Diffuse nonobstructive LAD and circumflex disease.  Severe RCA stenosis involving distal branches. Multitude of complaints including neuropathy, vertigo, anxiety.  We discussed at length.  12/12/17 - rare GERD At night . If stop eating at 7 improved GERD symptoms.  Overall he has been rehabbing from lumbar spine surgery back on 11/02/2017.  Been doing fairly well.  No heart  symptoms to speak of.  No chest pain, no shortness of breath, no syncope.  Blood pressure slightly on the low side today but he is asymptomatic.   Past Medical History:  Diagnosis Date  . Arthritis    "knees, toes, hands" (06/26/2014)  . Ataxia   . Basal cell carcinoma    left temporal area-no residual  . Colon polyps 10-12-11   past hx.  . Exertional angina ([place]) 06/26/2014   Cath with DES to the OM, Bioflow protocol   . GERD (gastroesophageal reflux disease)   . H/O hiatal hernia    no problems  . High cholesterol   . Hypertension   . Ischemic chest pain 08/07/2014  . Occasional tremors    Bilateral hands  . Peripheral neuropathy   . Pneumonia 1940's X 2  . Pseudogout    "it moves around"  . Sleep apnea    no cpap used  . Type II diabetes mellitus ([place]) dx'd 1982   nsulin started ~ 2000  . Vertigo     Past Surgical History:  Procedure Laterality Date  . BACK SURGERY    . BASAL CELL CARCINOMA EXCISION Left ~ 2012   face  . CARDIAC CATHETERIZATION  02/2005   Dr. Mare Ferrari; negative.  Marland Kitchen CARDIAC CATHETERIZATION N/A 05/13/2016   Procedure: Left Heart Cath and Coronary Angiography;  Surgeon: Jerline Pain, MD;  Location: [facility];  Service: Cardiovascular;  Laterality: N/A;  . CAROTID ENDARTERECTOMY Left Ssept. 14, 2016   CEA  . CARPAL TUNNEL RELEASE Left 10/2009  .  CARPAL TUNNEL RELEASE Right ~ 2013  . CATARACT EXTRACTION W/ INTRAOCULAR LENS IMPLANT Left 2000's  . CATARACT EXTRACTION W/ INTRAOCULAR LENS IMPLANT Right 2015  . COLONOSCOPY W/ POLYPECTOMY    . CORONARY ANGIOPLASTY WITH STENT PLACEMENT  06/26/2014   "1"  . CORONARY ANGIOPLASTY WITH STENT PLACEMENT  06/26/2014   pLAD 50%, d LAD 60%, oD1 80%,  mD1  70%, CFX 50%, OM 2 70%,  RCA 80%, PDA 95% (<74mm), OM1 99%-0% with Bio flow stent       . ELBOW SURGERY Bilateral ~ 2014   "for blockages; Dr. Amedeo Plenty"  . ENDARTERECTOMY Left 04/23/2015   Procedure: ENDARTERECTOMY CAROTID;  Surgeon: Conrad Oakesdale, MD;   Location: [facility];  Service: Vascular;  Laterality: Left;  . KNEE ARTHROSCOPY Left 05/2011   10'12-left knee  . LEFT HEART CATHETERIZATION WITH CORONARY ANGIOGRAM N/A 06/26/2014   Procedure: LEFT HEART CATHETERIZATION WITH CORONARY ANGIOGRAM;  Surgeon: Blane Ohara, MD;  Location: [facility];  Service: Cardiovascular;  Laterality: N/A;  . LEFT HEART CATHETERIZATION WITH CORONARY ANGIOGRAM N/A 08/07/2014   Procedure: LEFT HEART CATHETERIZATION WITH CORONARY ANGIOGRAM;  Surgeon: Blane Ohara, MD;  Location[facility];  Service: Cardiovascular;  Laterality: N/A;  . LUMBAR LAMINECTOMY/DECOMPRESSION MICRODISCECTOMY Left 11/02/2017   Procedure: MICRODISCECTOMY LUMBAR THREE- LUMBAR FOUR, LEFT;  Surgeon: Ashok Pall, MD;  Location: [place];  Service: Neurosurgery;  Laterality: Left;  . NASAL SEPTUM SURGERY  1979  . PATCH ANGIOPLASTY Left 04/23/2015   Procedure: PATCH ANGIOPLASTY USING 1CM X 6CM XENOSURE BIOLOGIC PATCH;  Surgeon: Conrad N[place], MD;  Location: [place];  Service: Vascular;  Laterality: Left;  . [place]  . SHOULDER OPEN ROTATOR CUFF REPAIR  10/14/2011   Procedure: ROTATOR CUFF REPAIR SHOULDER OPEN;  Surgeon: Johnn Hai, MD;  Location: [facility];  Service: Orthopedics;  Laterality: Right;  . SUBACROMIAL DECOMPRESSION  10/14/2011   Procedure: SUBACROMIAL DECOMPRESSION;  Surgeon: Johnn Hai, MD;  Location: [facility];  Service: Orthopedics;  Laterality: Right;  . TRIGGER FINGER RELEASE Right ~ 2013-2014   "3rd & 4th digits"    Current Medications: Outpatient Medications Prior to Visit  Medication Sig Dispense Refill  . acetaminophen (TYLENOL) 325 MG tablet Take 650 mg by mouth every 6 (six) hours as needed for moderate pain or headache.     Marland Kitchen acetaminophen-codeine (TYLENOL #3) 300-30 MG tablet Take 1 tablet by mouth every 6 (six) hours as needed for moderate pain.    Marland Kitchen alum & mag hydroxide-simeth (MAALOX/MYLANTA) 200-200-20 MG/5ML suspension Take 15 mLs by mouth  every 6 (six) hours as needed for indigestion or heartburn.    Marland Kitchen aspirin 81 MG tablet Take 1 tablet (81 mg total) by mouth daily.    Marland Kitchen atorvastatin (LIPITOR) 40 MG tablet TAKE 1 TABLET BY MOUTH ONCE DAILY BEFORE BREAKFAST (Patient taking differently: Take 20 mg by mouth once daily) 90 tablet 3  . Calcium Carbonate-Simethicone (ALKA-SELTZER HEARTBURN + GAS) 750-80 MG CHEW Chew 2 tablets by mouth daily as needed (heartburn).    . clopidogrel (PLAVIX) 75 MG tablet TAKE 1 TABLET BY MOUTH ONCE DAILY 90 tablet 3  . colchicine 0.6 MG tablet Take 0.6 mg by mouth daily as needed (gout).     . Cyanocobalamin (B-12) 5000 MCG CAPS Take 5,000 mcg by mouth daily.    . dorzolamide-timolol (COSOPT) 22.3-6.8 MG/ML ophthalmic solution Place 1 drop into both eyes 2 (two) times daily.    . empagliflozin (JARDIANCE) 25  MG TABS tablet Take 25 mg by mouth daily.    Marland Kitchen guaiFENesin (MUCINEX) 600 MG 12 hr tablet Take 600 mg by mouth 2 (two) times daily as needed for cough.    Marland Kitchen HUMALOG KWIKPEN 200 UNIT/ML SOPN Inject 10-22 Units as directed 3 (three) times daily. SLIDING SCALE  1  . Hypromellose (ARTIFICIAL TEARS OP) Place 1 drop into both eyes daily as needed (dry eyes).    Marland Kitchen ibuprofen (ADVIL,MOTRIN) 200 MG tablet Take 400 mg by mouth every 6 (six) hours as needed for moderate pain.    Marland Kitchen insulin glargine (LANTUS) 100 UNIT/ML injection Inject 75-80 Units into the skin at bedtime.     . Insulin Pen Needle (FIFTY50 PEN NEEDLES) 31G X 8 MM MISC     . isosorbide mononitrate (IMDUR) 60 MG 24 hr tablet Take 1.5 tablets (90 mg total) by mouth daily. 135 tablet 2  . lisinopril (PRINIVIL,ZESTRIL) 10 MG tablet TAKE 1 TABLET BY MOUTH ONCE DAILY BEFORE BREAKFAST 90 tablet 3  . LYRICA 100 MG capsule Take 100 mg by mouth 2 (two) times daily as needed. For neuropathic pain  2  . Melatonin 5 MG TABS Take 5 mg by mouth at bedtime.    . metFORMIN (GLUCOPHAGE) 1000 MG tablet TAKE ONE TABLET BY MOUTH TWICE DAILY WITH A MEAL (Patient taking  differently: TAKE ONE TABLET (1000mg ) BY MOUTH TWICE DAILY WITH A MEAL) 60 tablet 0  . nitroGLYCERIN (NITROSTAT) 0.4 MG SL tablet PLACE 1 TABLET UNDER THE TONGUE EVERY 5 MINUTES AS NEEDED FOR CHEST PAIN 25 tablet 3  . Omega-3 Fatty Acids (FISH OIL PO) Take 520 mg by mouth daily.    Marland Kitchen OVER THE COUNTER MEDICATION Apply 1 application topically daily as needed (nereve pain). Theraworx otc pain relief foam    . oxyCODONE (OXY IR/ROXICODONE) 5 MG immediate release tablet Take 1 tablet by mouth every 6 (six) hours as needed.  0  . pantoprazole (PROTONIX) 40 MG tablet TAKE 1 TABLET BY MOUTH TWICE DAILY 180 tablet 1  . Probiotic Product (PROBIOTIC PO) Take 1 tablet by mouth daily.    Marland Kitchen pyridoxine (B-6) 100 MG tablet Take 100 mg by mouth daily.    Marland Kitchen tiZANidine (ZANAFLEX) 4 MG tablet Take 1 tablet by mouth every 8 (eight) hours as needed.  1  . triamcinolone cream (KENALOG) 0.1 % Apply 1 application topically daily as needed (lichen planus). With cetaphil    . metoprolol tartrate (LOPRESSOR) 50 MG tablet Take 1 tablet (50 mg total) by mouth 2 (two) times daily. 180 tablet 3   No facility-administered medications prior to visit.      Allergies:   Gabapentin; Morphine and related; and Naproxen   Social History   Socioeconomic History  . Marital status: Married    Spouse name: Not on file  . Number of children: 5  . Years of education: College  . Highest education level: Not on file  Occupational History  . Not on file  Social Needs  . Financial resource strain: Not on file  . Food insecurity:    Worry: Not on file    Inability: Not on file  . Transportation needs:    Medical: Not on file    Non-medical: Not on file  Tobacco Use  . Smoking status: Former Smoker    Packs/day: 0.50    Years: 20.00    Pack years: 10.00    Types: Cigarettes    Last attempt to quit: 10/11/1988    Years  since quitting: 29.1  . Smokeless tobacco: Never Used  Substance and Sexual Activity  . Alcohol use: No     Alcohol/week: 0.0 oz    Comment: Quit drinking in 1990  . Drug use: No  . Sexual activity: Never  Lifestyle  . Physical activity:    Days per week: Not on file    Minutes per session: Not on file  . Stress: Not on file  Relationships  . Social connections:    Talks on phone: Not on file    Gets together: Not on file    Attends religious service: Not on file    Active member of club or organization: Not on file    Attends meetings of clubs or organizations: Not on file    Relationship status: Not on file  Other Topics Concern  . Not on file  Social History Narrative   Drinks about 2 cups of coffee a day, occasional diet pepsi.     Quit smoking approximately 27 years ago.  Family History:  The patient's family history includes Cancer in his sister; Diabetes in his brother, mother, and sister; Heart attack (age of onset: 37) in his father; Hypertension in his brother and sister; Varicose Veins in his mother and sister.   ROS:   Please see the history of present illness.    Review of Systems  All other systems reviewed and are negative.     PHYSICAL EXAM:   VS:  BP 92/60   Pulse 73   Ht 5' 11.5" (1.816 m)   Wt 249 lb 3.2 oz (113 kg)   SpO2 98%   BMI 34.27 kg/m    GEN: Well nourished, well developed, in no acute distress, using walker for ambulation. HEENT: normal  Neck: no JVD, carotid bruits, or masses Cardiac: RRR; no murmurs, rubs, or gallops,no edema  Respiratory:  clear to auscultation bilaterally, normal work of breathing GI: soft, nontender, nondistended, + BS, overweight  MS: no deformity or atrophy  Skin: warm and dry, no rash Neuro:  Alert and Oriented x 3, Strength and sensation are intact Psych: euthymic mood, full affect    Wt Readings from Last 3 Encounters:  12/12/17 249 lb 3.2 oz (113 kg)  11/02/17 252 lb (114.3 kg)  06/09/17 260 lb 3.2 oz (118 kg)      Studies/Labs Reviewed:   EKG:  None today  Recent Labs: 11/02/2017: BUN 13; Creatinine,  Ser 1.26; Hemoglobin 13.2; Platelets 197; Potassium 4.1; Sodium 138   Lipid Panel No results found for: CHOL, TRIG, HDL, CHOLHDL, VLDL, LDLCALC, LDLDIRECT  Additional studies/ records that were reviewed today include:  ECHO  (07/21/15) - Left ventricle: The cavity size was normal. There was severe focal basal and mild concentric hypertrophy of the left ventricle. Systolic function was vigorous. The estimated ejection fraction was in the range of 65% to 70%. Wall motion was normal; there were no regional wall motion abnormalities. Doppler parameters are consistent with abnormal left ventricular relaxation (grade 1 diastolic dysfunction). Doppler parameters are consistent with elevated ventricular end-diastolic filling pressure.    ASSESSMENT:    1. Angina pectoris ([place])   2. Coronary artery disease due to lipid rich plaque   3. Hypercholesterolemia   4. Diabetes mellitus with coincident hypertension (HCC)      PLAN:  In order of problems listed above:  Coronary artery disease/Angina  - Isosorbide 90, metoprolol 50 twice a day.  Continue to encourage weight loss, optimization of diet.   Essential hypertension  -  Isosorbide, lisinopril, metoprolol. Controlled, in fact slightly low today.  Asymptomatic.  Carotid artery disease  - Endarterectomy 04/23/15, left with no TIA symptoms. Dr. Bridgett Larsson. Aggressive secondary prevention, continue.  Hyperlipidemia  - Lipitor. PCP has been following this.  No changes.  LDL 65.  Diabetes with insulin use  - Hemoglobin once he 7.0. Medicines reviewed. Jardiance, doing well.  Obesity  - Continue to encourage weight loss. This will also help his diabetes.  Understands.  Vertigo  -Not had any symptoms recently.  After shower better.  Has seen several doctors for this.  Contemplating acupuncture.  Medication Adjustments/Labs and Tests Ordered: Current medicines are reviewed at length with the patient today.  Concerns  regarding medicines are outlined above.  Medication changes, Labs and Tests ordered today are listed in the Patient Instructions below.  30-month follow-up with Mickel Baas, 1 year with me  Patient Instructions  Medication Instructions:  The current medical regimen is effective;  continue present plan and medications.  Follow-Up: Follow up in 6 months with Cecilie Kicks, NP.  You will receive a letter in the mail 2 months before you are due.  Please call us when you receive this letter to schedule your follow up appointment.  Follow up in 1 year with Dr. Marlou Porch.  You will receive a letter in the mail 2 months before you are due.  Please call us when you receive this letter to schedule your follow up appointment.  If you need a refill on your cardiac medications before your next appointment, please call your pharmacy.  Thank you for choosing [facility]!!        Signed, Candee Furbish, MD  12/12/2017 12:00 PM    [address], [place], [place]  [address] Phone: [phone]; Fax: [phone]

## 2017-12-23 DIAGNOSIS — E1165 Type 2 diabetes mellitus with hyperglycemia: Secondary | ICD-10-CM | POA: Diagnosis not present

## 2017-12-24 ENCOUNTER — Other Ambulatory Visit: Payer: Self-pay | Admitting: Cardiology

## 2017-12-24 DIAGNOSIS — R079 Chest pain, unspecified: Secondary | ICD-10-CM

## 2017-12-24 DIAGNOSIS — R0789 Other chest pain: Principal | ICD-10-CM

## 2018-02-15 ENCOUNTER — Ambulatory Visit: Payer: Medicare Other | Admitting: Physician Assistant

## 2018-02-15 ENCOUNTER — Encounter: Payer: Self-pay | Admitting: Physician Assistant

## 2018-02-15 ENCOUNTER — Telehealth: Payer: Self-pay | Admitting: Cardiology

## 2018-02-15 VITALS — BP 116/72 | HR 74 | Ht 71.5 in | Wt 246.8 lb

## 2018-02-15 DIAGNOSIS — I25119 Atherosclerotic heart disease of native coronary artery with unspecified angina pectoris: Secondary | ICD-10-CM | POA: Diagnosis not present

## 2018-02-15 DIAGNOSIS — Z794 Long term (current) use of insulin: Secondary | ICD-10-CM | POA: Diagnosis not present

## 2018-02-15 DIAGNOSIS — I739 Peripheral vascular disease, unspecified: Secondary | ICD-10-CM

## 2018-02-15 DIAGNOSIS — I779 Disorder of arteries and arterioles, unspecified: Secondary | ICD-10-CM | POA: Diagnosis not present

## 2018-02-15 DIAGNOSIS — E119 Type 2 diabetes mellitus without complications: Secondary | ICD-10-CM | POA: Diagnosis not present

## 2018-02-15 DIAGNOSIS — E785 Hyperlipidemia, unspecified: Secondary | ICD-10-CM

## 2018-02-15 DIAGNOSIS — I10 Essential (primary) hypertension: Secondary | ICD-10-CM

## 2018-02-15 DIAGNOSIS — IMO0001 Reserved for inherently not codable concepts without codable children: Secondary | ICD-10-CM

## 2018-02-15 DIAGNOSIS — K219 Gastro-esophageal reflux disease without esophagitis: Secondary | ICD-10-CM | POA: Diagnosis not present

## 2018-02-15 DIAGNOSIS — R0789 Other chest pain: Secondary | ICD-10-CM | POA: Diagnosis not present

## 2018-02-15 DIAGNOSIS — E1129 Type 2 diabetes mellitus with other diabetic kidney complication: Secondary | ICD-10-CM | POA: Diagnosis not present

## 2018-02-15 MED ORDER — RANOLAZINE ER 500 MG PO TB12: 500.0000 mg | ORAL_TABLET | Freq: Two times a day (BID) | ORAL | 6 refills | Status: DC

## 2018-02-15 NOTE — Telephone Encounter (Signed)
NEW MESSAGE    Pt c/o of Chest Pain: STAT if CP now or developed within 24 hours  1. Are you having CP right now?  NO  2. Are you experiencing any other symptoms (ex. SOB, nausea, vomiting, sweating)?   3. How long have you been experiencing CP? 2 WEEKS  4. Is your CP continuous or coming and going? COMING AND GOING   5. Have you taken Nitroglycerin? YES ?

## 2018-02-15 NOTE — Progress Notes (Signed)
Cardiology Office Note:    Date:  02/15/2018   ID:  Seth Young, DOB July 26, 1941, MRN [id number]  PCP:  Seth Bunting, MD  Cardiologist:  Seth Furbish, MD   Referring MD: Seth Bunting, MD   Chief Complaint  Patient presents with  . Chest Pain    History of Present Illness:    Seth Young is a 77 y.o. male with coronary artery disease status post DES to the Seth Young in 2015, diabetes, hypertension, hyperlipidemia, vertigo, carotid artery disease status post left CEA in 2016.  He has chronic angina.  He has known severe, diffuse disease in the RCA which is best treated medically.  He also has moderate nonobstructive disease in LAD.  His last cardiac catheterization October 2017 demonstrated a patent stent in the OM1.  There was diffuse disease in the RCA.  There was increased stenosis in the PDA.  It was felt that balloon angioplasty could be considered of the PDA if he had refractory angina.  However, this vessel is quite small, making PCI not ideal.  He was last seen by Seth. Marlou Young in May 2019.  Symptoms are overall stable and his current medical program was continued.  Seth Young is referred back today by his PCP for evaluation of chest pain.  The patient had back surgery in 10/2017.  He has not been that active since his surgery.  He did go on vacation to [place] recently.  He was very active on his trip.  He had one episode of angina that required NTG.  In the last 2 weeks, he has had discomfort in his chest mainly at night when he lays down.  It resolves with NTG and raising the head of his bed.  He has awoken at times with chest pain.  He does admit to a poor diet recently.  He had chest pain with exertion yesterday that resolved with NTG.  He did eat sausage right before this.  He swims for exercise.  He occasionally gets angina with swimming. This is a chronic symptom.  He last took NTG during a swim several weeks ago.  He sometimes feels short of breath with his chest pain.  He denies  paroxysmal nocturnal dyspnea, edema, syncope.  His PCP changed his GERD medications today.    Prior CV studies:   The following studies were reviewed today:  Carotid US 02/23/2017 R 1-39; L CEA patent  Cardiac catheterization 05/13/2016 EF 82 OM1 stent patent RCA diffuse disease, mid 60; PDA 80; increased stenosis in the distal segment of the small acute marginal  Echocardiogram 07/21/2015 Severe focal basal and mild concentric LVH, EF 65-70, normal wall motion, grade 1 diastolic dysfunction, mild to moderate AI, mild TR  Cardiac catheterization 08/07/2014 LM patent LAD proximal 20-30, proximal-mid 40-50; D1 ostial 30-40, mid 60-70 LCx patent; OM1 stent patent; AV circumflex ostial and proximal 40-50 RCA proximal 50, mid 70-80, distal vessel and PDA 95 (subtotal occlusion) leading into tiny PDA branch; acute marginal 50-60 Recommendations: The patient's CAD is stable. His recently implanted stent is widely patent. His LAD stenosis is actually improved from the previous study (likely because of IC NTG). I do not think his right PDA is graftable or treatable with PCI and would favor ongoing medical therapy. Will add low-dose isosorbide and arrange follow-up with Seth Young.  Stress test 07/30/2014 High risk stress nuclear study Moderate area of anterior and apical wall ischemiaas well as inferior wall ischemia from apex to base.  LV Ejection Fraction:  56%.  LV Wall Motion:  NL LV Function; NL Wall Motion  Past Medical History:  Diagnosis Date  . Arthritis    "knees, toes, hands" (06/26/2014)  . Ataxia   . Basal cell carcinoma    left temporal area-no residual  . Colon polyps 10-12-11   past hx.  . Exertional angina (Seth Young) 06/26/2014   Cath with DES to the OM, Bioflow protocol   . GERD (gastroesophageal reflux disease)   . H/O hiatal hernia    no problems  . High cholesterol   . Hypertension   . Ischemic chest pain 08/07/2014  . Occasional tremors    Bilateral hands  .  Peripheral neuropathy   . Pneumonia 1940's X 2  . Pseudogout    "it moves around"  . Sleep apnea    no cpap used  . Type II diabetes mellitus (Seth Young) dx'd 1982   nsulin started ~ 2000  . Vertigo    Surgical Hx: The patient  has a past surgical history that includes Back surgery; Nasal septum surgery (1979); Carpal tunnel release (Left, 10/2009); Knee arthroscopy (Left, 05/2011); Shoulder open rotator cuff repair (10/14/2011); Subacromial decompression (10/14/2011); Cardiac catheterization (02/2005); Coronary angioplasty with stent (06/26/2014); Posterior lumbar fusion (1964); Carpal tunnel release (Right, ~ 2013); Cataract extraction w/ intraocular lens implant (Left, 2000's); Cataract extraction w/ intraocular lens implant (Right, 2015); Elbow surgery (Bilateral, ~ 2014); Trigger finger release (Right, ~ 2013-2014); Excision basal cell carcinoma (Left, ~ 2012); Coronary angioplasty with stent (06/26/2014); left heart catheterization with coronary angiogram (N/A, 06/26/2014); left heart catheterization with coronary angiogram (N/A, 08/07/2014); Colonoscopy w/ polypectomy; Endarterectomy (Left, 04/23/2015); Patch angioplasty (Left, 04/23/2015); Carotid endarterectomy (Left, Ssept. 14, 2016); Cardiac catheterization (N/A, 05/13/2016); and Lumbar laminectomy/decompression microdiscectomy (Left, 11/02/2017).   Current Medications: Current Meds  Medication Sig  . alum & mag hydroxide-simeth (MAALOX/MYLANTA) 200-200-20 MG/5ML suspension Take 15 mLs by mouth every 6 (six) hours as needed for indigestion or heartburn.  Marland Kitchen aspirin 81 MG tablet Take 1 tablet (81 mg total) by mouth daily.  Marland Kitchen atorvastatin (LIPITOR) 40 MG tablet TAKE 1 TABLET BY MOUTH ONCE DAILY BEFORE BREAKFAST (Patient taking differently: Take 20 mg by mouth once daily)  . Calcium Carbonate-Simethicone (ALKA-SELTZER HEARTBURN + GAS) 750-80 MG CHEW Chew 2 tablets by mouth daily as needed (heartburn).  . chlorhexidine (PERIDEX) 0.12 % solution SWISH WITH  1 2 OUNCE FOR 30 SECONDS THEN SPIT TWICE DAILY.  Marland Kitchen clopidogrel (PLAVIX) 75 MG tablet TAKE 1 TABLET BY MOUTH ONCE DAILY  . colchicine 0.6 MG tablet Take 0.6 mg by mouth daily as needed (gout).   . Cyanocobalamin (B-12) 5000 MCG CAPS Take 5,000 mcg by mouth daily.  . dorzolamide-timolol (COSOPT) 22.3-6.8 MG/ML ophthalmic solution Place 1 drop into both eyes 2 (two) times daily.  . empagliflozin (JARDIANCE) 25 MG TABS tablet Take 25 mg by mouth daily.  Marland Kitchen guaiFENesin (MUCINEX) 600 MG 12 hr tablet Take 600 mg by mouth 2 (two) times daily as needed for cough.  Marland Kitchen HUMALOG KWIKPEN 200 UNIT/ML SOPN Inject 10-22 Units as directed 3 (three) times daily. SLIDING SCALE  . Hypromellose (ARTIFICIAL TEARS OP) Place 1 drop into both eyes daily as needed (dry eyes).  Marland Kitchen ibuprofen (ADVIL,MOTRIN) 200 MG tablet Take 400 mg by mouth every 6 (six) hours as needed for moderate pain.  Marland Kitchen insulin glargine (LANTUS) 100 UNIT/ML injection Inject 75-80 Units into the skin at bedtime.   . Insulin Pen Needle (FIFTY50 PEN NEEDLES) 31G X 8 MM MISC   .  isosorbide mononitrate (IMDUR) 60 MG 24 hr tablet TAKE 1 & 1/2 (ONE & ONE-HALF) TABLETS BY MOUTH  DAILY  . lisinopril (PRINIVIL,ZESTRIL) 10 MG tablet TAKE 1 TABLET BY MOUTH ONCE DAILY BEFORE BREAKFAST  . LYRICA 100 MG capsule Take 100 mg by mouth 2 (two) times daily as needed. For neuropathic pain  . Melatonin 5 MG TABS Take 5 mg by mouth at bedtime.  . metFORMIN (GLUCOPHAGE) 1000 MG tablet TAKE ONE TABLET BY MOUTH TWICE DAILY WITH A MEAL (Patient taking differently: TAKE ONE TABLET (1000mg ) BY MOUTH TWICE DAILY WITH A MEAL)  . nitroGLYCERIN (NITROSTAT) 0.4 MG SL tablet PLACE 1 TABLET UNDER THE TONGUE EVERY 5 MINUTES AS NEEDED FOR CHEST PAIN  . nystatin (MYCOSTATIN) 100000 UNIT/ML suspension SWISH WITH 5 MLS IN MOUTH 4 TIMES DAILY FOR 2 MINUTES THEN SWALLOW  . Omega-3 Fatty Acids (FISH OIL PO) Take 520 mg by mouth daily.  Marland Kitchen OVER THE COUNTER MEDICATION Apply 1 application topically  daily as needed (nereve pain). Theraworx otc pain relief foam  . oxyCODONE (OXY IR/ROXICODONE) 5 MG immediate release tablet Take 1 tablet by mouth every 6 (six) hours as needed.  . pantoprazole (PROTONIX) 40 MG tablet TAKE 1 TABLET BY MOUTH TWICE DAILY  . Probiotic Product (PROBIOTIC PO) Take 1 tablet by mouth daily.  Marland Kitchen pyridoxine (B-6) 100 MG tablet Take 100 mg by mouth daily.  . ranolazine (RANEXA) 500 MG 12 hr tablet Take 1 tablet (500 mg total) by mouth 2 (two) times daily.  Marland Kitchen tiZANidine (ZANAFLEX) 4 MG tablet Take 1 tablet by mouth every 8 (eight) hours as needed.  . triamcinolone cream (KENALOG) 0.1 % Apply 1 application topically daily as needed (lichen planus). With cetaphil  . [DISCONTINUED] ranolazine (RANEXA) 500 MG 12 hr tablet Take 1 tablet (500 mg total) by mouth 2 (two) times daily.     Allergies:   Gabapentin; Morphine and related; and Naproxen   Social History   Tobacco Use  . Smoking status: Former Smoker    Packs/day: 0.50    Years: 20.00    Pack years: 10.00    Types: Cigarettes    Last attempt to quit: 10/11/1988    Years since quitting: 29.3  . Smokeless tobacco: Never Used  Substance Use Topics  . Alcohol use: No    Alcohol/week: 0.0 oz    Comment: Quit drinking in 1990  . Drug use: No     Family Hx: The patient's family history includes Cancer in his sister; Diabetes in his brother, mother, and sister; Heart attack (age of onset: 32) in his father; Hypertension in his brother and sister; Varicose Veins in his mother and sister.  ROS:   Please see the history of present illness.    Review of Systems  Cardiovascular: Positive for chest pain.  Respiratory: Positive for shortness of breath.   Musculoskeletal: Positive for back pain.   All other systems reviewed and are negative.   EKGs/Labs/Other Test Reviewed:    EKG:  EKG is  ordered today.  The ekg ordered today demonstrates normal sinus rhythm, HR 73, normal axis, septal Q waves, QTc 418 ms, no  change from prior tracing.  Recent Labs: 11/02/2017: BUN 13; Creatinine, Ser 1.26; Hemoglobin 13.2; Platelets 197; Potassium 4.1; Sodium 138   Recent Lipid Panel No results found for: CHOL, TRIG, HDL, CHOLHDL, LDLCALC, LDLDIRECT  Physical Exam:    VS:  BP 116/72   Pulse 74   Ht 5' 11.5" (1.816 m)   Wt  246 lb 12.8 oz (111.9 kg)   SpO2 98%   BMI 33.94 kg/m      Wt Readings from Last 3 Encounters:  02/15/18 246 lb 12.8 oz (111.9 kg)  12/12/17 249 lb 3.2 oz (113 kg)  11/02/17 252 lb (114.3 kg)     Physical Exam  Constitutional: He is oriented to person, place, and time. He appears well-developed and well-nourished. No distress.  HENT:  Head: Normocephalic and atraumatic.  Neck: Neck supple. No JVD present.  Cardiovascular: Normal rate, regular rhythm, S1 normal and S2 normal.  No murmur heard. Pulmonary/Chest: Breath sounds normal. He has no rales.  Abdominal: Soft. There is no hepatomegaly.  Musculoskeletal: He exhibits no edema.  Neurological: He is alert and oriented to person, place, and time.  Skin: Skin is warm and dry.    ASSESSMENT & PLAN:    Coronary artery disease involving native coronary artery of native heart with angina pectoris ([place])  Hx of DES to the OM1.  He has chronic angina related to severe, diffuse disease in the RCA.  He presents today with somewhat worsening chest pain over the last 2 weeks.  However, he also notes chest pain after eating spicy food and his symptoms improve with raising the head of his bed.  His last Cardiac Catheterization was in 2017.  Today's ECG does not demonstrate any ischemic changes.  We had a long discussion regarding proceeding to Cardiac Catheterization vs adjusting his antianginal therapy with close follow up.  I lean more towards proceeding to Cardiac Catheterization first, but he prefers adjusting his medication first.  If he continues to have symptoms, we will need to proceed with Cardiac Catheterization to further evaluate.   He knows to go to the ED if he has worse symptoms.  -Continue aspirin, clopidogrel, atorvastatin, metoprolol, isosorbide.  -Start Ranexa 500 mg twice daily  -Obtain 2D echocardiogram  -Follow-up 2 weeks  Gastroesophageal reflux disease without esophagitis He does have symptoms with lying flat and improved with raising the head of his bed.  His primary care doctor did adjust his proton pump inhibitor therapy today as well.  It is quite possible that his symptoms may be related to uncontrolled acid reflux.  Bilateral carotid artery disease, unspecified type ([place]) Followed by vascular surgery.  IDDM (insulin dependent diabetes mellitus) ([place]) Continue follow-up with primary care.  Essential hypertension The patient's blood pressure is controlled on his current regimen.  Continue current therapy.   Hyperlipidemia, unspecified hyperlipidemia type Continue statin therapy.   Dispo:  Return in about 2 weeks (around 03/01/2018) for Close Follow Up, w/ Richardson Dopp, PA-C.   Medication Adjustments/Labs and Tests Ordered: Current medicines are reviewed at length with the patient today.  Concerns regarding medicines are outlined above.  Tests Ordered: Orders Placed This Encounter  Procedures  . EKG 12-Lead  . ECHOCARDIOGRAM COMPLETE   Medication Changes: Meds ordered this encounter  Medications  . DISCONTD: ranolazine (RANEXA) 500 MG 12 hr tablet    Sig: Take 1 tablet (500 mg total) by mouth 2 (two) times daily.    Dispense:  60 tablet    Refill:  6  . ranolazine (RANEXA) 500 MG 12 hr tablet    Sig: Take 1 tablet (500 mg total) by mouth 2 (two) times daily.    Dispense:  60 tablet    Refill:  6    Signed, Richardson Dopp, PA-C  02/15/2018 1:08 PM    [facility] [place],  [place]  [address] Phone: [phone]; Fax: [phone]

## 2018-02-15 NOTE — Patient Instructions (Addendum)
Medication Instructions:  1. START RANEXA 500 MG 1 TABLET TWICE DAILY; RX HAS BEEN SENT IN   Labwork: NONE ORDERED TODAY  Testing/Procedures: Your physician has requested that you have an echocardiogram. Echocardiography is a painless test that uses sound waves to create images of your heart. It provides your doctor with information about the size and shape of your heart and how well your heart's chambers and valves are working. This procedure takes approximately one hour. There are no restrictions for this procedure. TO BE DONE ON 02/16/18 @ 10:30 AM     Follow-Up: Seth Young, PAC ON 03/10/18 @ 8:45 AM   Any Other Special Instructions Will Be Listed Below (If Applicable).     If you need a refill on your cardiac medications before your next appointment, please call your pharmacy.

## 2018-02-15 NOTE — Telephone Encounter (Signed)
Spoke with pt who has an appt set up with Richardson Dopp today at 12:15 for an evaluation of CP. Pt states CP is not sustained and relieved by nitro. Pt had an episode of bad CP last night, also relieved by nitro. Pt understands if he has an episode of CP sustained and unrelieved by nitro, he should go to the ER vs coming into the office. Pt had no additional questions.

## 2018-02-16 ENCOUNTER — Ambulatory Visit (HOSPITAL_COMMUNITY): Payer: Medicare Other | Attending: Cardiology

## 2018-02-16 ENCOUNTER — Other Ambulatory Visit: Payer: Self-pay

## 2018-02-16 ENCOUNTER — Encounter: Payer: Self-pay | Admitting: Physician Assistant

## 2018-02-16 DIAGNOSIS — E785 Hyperlipidemia, unspecified: Secondary | ICD-10-CM | POA: Insufficient documentation

## 2018-02-16 DIAGNOSIS — I25119 Atherosclerotic heart disease of native coronary artery with unspecified angina pectoris: Secondary | ICD-10-CM | POA: Insufficient documentation

## 2018-02-16 DIAGNOSIS — I10 Essential (primary) hypertension: Secondary | ICD-10-CM | POA: Insufficient documentation

## 2018-02-16 DIAGNOSIS — E119 Type 2 diabetes mellitus without complications: Secondary | ICD-10-CM | POA: Diagnosis not present

## 2018-02-16 DIAGNOSIS — G4730 Sleep apnea, unspecified: Secondary | ICD-10-CM | POA: Insufficient documentation

## 2018-02-16 DIAGNOSIS — R079 Chest pain, unspecified: Secondary | ICD-10-CM | POA: Insufficient documentation

## 2018-02-28 ENCOUNTER — Ambulatory Visit: Payer: Medicare Other | Admitting: Family

## 2018-02-28 ENCOUNTER — Encounter: Payer: Self-pay | Admitting: Family

## 2018-02-28 ENCOUNTER — Other Ambulatory Visit: Payer: Self-pay

## 2018-02-28 ENCOUNTER — Ambulatory Visit (HOSPITAL_COMMUNITY)
Admission: RE | Admit: 2018-02-28 | Discharge: 2018-02-28 | Disposition: A | Discharge status: Home / Self Care | Payer: Medicare Other | Source: Ambulatory Visit | Attending: Family | Admitting: Family

## 2018-02-28 VITALS — BP 112/65 | HR 72 | Temp 97.5°F | Resp 16 | Ht 71.5 in | Wt 247.5 lb

## 2018-02-28 DIAGNOSIS — E785 Hyperlipidemia, unspecified: Secondary | ICD-10-CM | POA: Diagnosis not present

## 2018-02-28 DIAGNOSIS — Z8669 Personal history of other diseases of the nervous system and sense organs: Secondary | ICD-10-CM | POA: Diagnosis not present

## 2018-02-28 DIAGNOSIS — I773 Arterial fibromuscular dysplasia: Secondary | ICD-10-CM | POA: Diagnosis not present

## 2018-02-28 DIAGNOSIS — Z9889 Other specified postprocedural states: Secondary | ICD-10-CM | POA: Diagnosis not present

## 2018-02-28 DIAGNOSIS — I10 Essential (primary) hypertension: Secondary | ICD-10-CM | POA: Insufficient documentation

## 2018-02-28 DIAGNOSIS — E119 Type 2 diabetes mellitus without complications: Secondary | ICD-10-CM | POA: Insufficient documentation

## 2018-02-28 DIAGNOSIS — I6522 Occlusion and stenosis of left carotid artery: Secondary | ICD-10-CM | POA: Insufficient documentation

## 2018-02-28 DIAGNOSIS — Z87891 Personal history of nicotine dependence: Secondary | ICD-10-CM | POA: Diagnosis not present

## 2018-02-28 NOTE — Patient Instructions (Signed)

## 2018-02-28 NOTE — Progress Notes (Signed)
Chief Complaint: Follow up Extracranial Carotid Artery Stenosis   History of Present Illness  Seth Young is a 78 y.o. male who is s/p L CEA (04/23/15) by Dr. Bridgett Larsson for L sx ICA stenosis >90%. Previous carotid studies demonstrated: RICA <54% stenosis, LICA >00% stenosis. Patient has history of stroke symptoms. The patient has had visual field loss felt related to carotid disease. The patient has never had unilateral facial drooping or hemiplegia. The patient has never had receptive or expressive aphasia. The patient's previous neurologic deficits have mostly resolved.  Pt states it has been suggested that he needs a left knee replacement.   He had lumbar spine surgery in March 2019, states in the last week he has noted signific relief from low back pain.   He swims a great deal.  He had a cardiac stent placed in 2015, denies any history of MI.  He has had issues with vertigo for a long time, relieved by lipoflavinoid, also helps with tinnitus    He does not seem to have claudication symptoms with walking.   He is seeing Dr. Marlou Porch office re chest pain at night, relieved by NTG.   Pt state he has occasional dyspnea which started in May or June 2018, states occasional cough from pollen. He states he was treated for pneumonia by his PCP in May 2018.   Diabetic: yes, A1C was 6.7 on 11-02-17, pt states it 6.4 in July 2019 Tobacco use: former smoker, quit in 1990, smoked x 20 years  Pt meds include: Statin : yes ASA: yes Other anticoagulants/antiplatelets: Plavix   Past Medical History:  Diagnosis Date  . Arthritis    "knees, toes, hands" (06/26/2014)  . Ataxia   . Basal cell carcinoma    left temporal area-no residual  . Colon polyps 10-12-11   past hx.  . Exertional angina ([place]) 06/26/2014   Cath with DES to the OM, Bioflow protocol   . GERD (gastroesophageal reflux disease)   . H/O hiatal hernia    no problems  . High cholesterol   . History of  echocardiogram    Echo 7/19:  Mild LVH, mild focal basal septal hypertrophy, EF 60-65, no RWMA, Gr 1 DD, trivial AI, MAC  . Hypertension   . Ischemic chest pain 08/07/2014  . Occasional tremors    Bilateral hands  . Peripheral neuropathy   . Pneumonia 1940's X 2  . Pseudogout    "it moves around"  . Sleep apnea    no cpap used  . Type II diabetes mellitus ([place]) dx'd 1982   nsulin started ~ 2000  . Vertigo     Social History Social History   Tobacco Use  . Smoking status: Former Smoker    Packs/day: 0.50    Years: 20.00    Pack years: 10.00    Types: Cigarettes    Last attempt to quit: 10/11/1988    Years since quitting: 29.4  . Smokeless tobacco: Never Used  Substance Use Topics  . Alcohol use: No    Alcohol/week: 0.0 oz    Comment: Quit drinking in 1990  . Drug use: No    Family History Family History  Problem Relation Age of Onset  . Diabetes Mother   . Varicose Veins Mother   . Heart attack Father 70       "massive heart attack"  . Cancer Sister   . Diabetes Sister   . Hypertension Sister   . Varicose Veins Sister   .  Diabetes Brother   . Hypertension Brother     Surgical History Past Surgical History:  Procedure Laterality Date  . BACK SURGERY    . BASAL CELL CARCINOMA EXCISION Left ~ 2012   face  . CARDIAC CATHETERIZATION  02/2005   Dr. Mare Ferrari; negative.  Marland Kitchen CARDIAC CATHETERIZATION N/A 05/13/2016   Procedure: Left Heart Cath and Coronary Angiography;  Surgeon: Jerline Pain, MD;  Location: [facility];  Service: Cardiovascular;  Laterality: N/A;  . CAROTID ENDARTERECTOMY Left Ssept. 14, 2016   CEA  . CARPAL TUNNEL RELEASE Left 10/2009  . CARPAL TUNNEL RELEASE Right ~ 2013  . CATARACT EXTRACTION W/ INTRAOCULAR LENS IMPLANT Left 2000's  . CATARACT EXTRACTION W/ INTRAOCULAR LENS IMPLANT Right 2015  . COLONOSCOPY W/ POLYPECTOMY    . CORONARY ANGIOPLASTY WITH STENT PLACEMENT  06/26/2014   "1"  . CORONARY ANGIOPLASTY WITH STENT PLACEMENT   06/26/2014   pLAD 50%, d LAD 60%, oD1 80%,  mD1  70%, CFX 50%, OM 2 70%,  RCA 80%, PDA 95% (<27m), OM1 99%-0% with Bio flow stent       . ELBOW SURGERY Bilateral ~ 2014   "for blockages; Dr. GAmedeo Plenty  . ENDARTERECTOMY Left 04/23/2015   Procedure: ENDARTERECTOMY CAROTID;  Surgeon: BConrad Southlake MD;  [facility]  Service: Vascular;  Laterality: Left;  . KNEE ARTHROSCOPY Left 05/2011   10'12-left knee  . LEFT HEART CATHETERIZATION WITH CORONARY ANGIOGRAM N/A 06/26/2014   Procedure: LEFT HEART CATHETERIZATION WITH CORONARY ANGIOGRAM;  Surgeon: MBlane Ohara MD;  [facility];  Service: Cardiovascular;  Laterality: N/A;  . LEFT HEART CATHETERIZATION WITH CORONARY ANGIOGRAM N/A 08/07/2014   Procedure: LEFT HEART CATHETERIZATION WITH CORONARY ANGIOGRAM;  Surgeon: MBlane Ohara MD;  [facility];  Service: Cardiovascular;  Laterality: N/A;  . LUMBAR LAMINECTOMY/DECOMPRESSION MICRODISCECTOMY Left 11/02/2017   Procedure: MICRODISCECTOMY LUMBAR THREE- LUMBAR FOUR, LEFT;  Surgeon: CAshok Pall MD;  [facility]  Service: Neurosurgery;  Laterality: Left;  . NASAL SEPTUM SURGERY  1979  . PATCH ANGIOPLASTY Left 04/23/2015   Procedure: PATCH ANGIOPLASTY USING 1CM X 6CM XENOSURE BIOLOGIC PATCH;  Surgeon: BConrad Bystrom MD;  [facility]  Service: Vascular;  Laterality: Left;  . [place] . SHOULDER OPEN ROTATOR CUFF REPAIR  10/14/2011   Procedure: ROTATOR CUFF REPAIR SHOULDER OPEN;  Surgeon: JJohnn Hai MD;  Location[facility];  Service: Orthopedics;  Laterality: Right;  . SUBACROMIAL DECOMPRESSION  10/14/2011   Procedure: SUBACROMIAL DECOMPRESSION;  Surgeon: JJohnn Hai MD;  Location: [facility];  Service: Orthopedics;  Laterality: Right;  . TRIGGER FINGER RELEASE Right ~ 2013-2014   "3rd & 4th digits"    Allergies  Allergen Reactions  . Gabapentin Other (See Comments)    Ataxia  . Morphine And Related Itching       . Naproxen Other (See  Comments)    Neurological reaction. Tremors, hallucinates     Current Outpatient Medications  Medication Sig Dispense Refill  . acetaminophen (TYLENOL) 325 MG tablet Take 650 mg by mouth every 6 (six) hours as needed for moderate pain or headache.     .Marland Kitchenacetaminophen-codeine (TYLENOL #3) 300-30 MG tablet Take 1 tablet by mouth every 6 (six) hours as needed for moderate pain.    .Marland Kitchenalum & mag hydroxide-simeth (MAALOX/MYLANTA) 200-200-20 MG/5ML suspension Take 15 mLs by mouth every 6 (six) hours as needed for indigestion or heartburn.    .Marland Kitchenaspirin  81 MG tablet Take 1 tablet (81 mg total) by mouth daily.    Marland Kitchen atorvastatin (LIPITOR) 40 MG tablet TAKE 1 TABLET BY MOUTH ONCE DAILY BEFORE BREAKFAST (Patient taking differently: Take 20 mg by mouth once daily) 90 tablet 3  . Calcium Carbonate-Simethicone (ALKA-SELTZER HEARTBURN + GAS) 750-80 MG CHEW Chew 2 tablets by mouth daily as needed (heartburn).    . chlorhexidine (PERIDEX) 0.12 % solution SWISH WITH 1 2 OUNCE FOR 30 SECONDS THEN SPIT TWICE DAILY.  0  . clopidogrel (PLAVIX) 75 MG tablet TAKE 1 TABLET BY MOUTH ONCE DAILY 90 tablet 3  . colchicine 0.6 MG tablet Take 0.6 mg by mouth daily as needed (gout).     . Cyanocobalamin (B-12) 5000 MCG CAPS Take 5,000 mcg by mouth daily.    . dorzolamide-timolol (COSOPT) 22.3-6.8 MG/ML ophthalmic solution Place 1 drop into both eyes 2 (two) times daily.    . empagliflozin (JARDIANCE) 25 MG TABS tablet Take 25 mg by mouth daily.    Marland Kitchen guaiFENesin (MUCINEX) 600 MG 12 hr tablet Take 600 mg by mouth 2 (two) times daily as needed for cough.    Marland Kitchen HUMALOG KWIKPEN 200 UNIT/ML SOPN Inject 10-22 Units as directed 3 (three) times daily. SLIDING SCALE  1  . Hypromellose (ARTIFICIAL TEARS OP) Place 1 drop into both eyes daily as needed (dry eyes).    Marland Kitchen ibuprofen (ADVIL,MOTRIN) 200 MG tablet Take 400 mg by mouth every 6 (six) hours as needed for moderate pain.    Marland Kitchen insulin glargine (LANTUS) 100 UNIT/ML injection Inject 75-80  Units into the skin at bedtime.     . Insulin Pen Needle (FIFTY50 PEN NEEDLES) 31G X 8 MM MISC     . isosorbide mononitrate (IMDUR) 60 MG 24 hr tablet TAKE 1 & 1/2 (ONE & ONE-HALF) TABLETS BY MOUTH  DAILY 135 tablet 3  . lisinopril (PRINIVIL,ZESTRIL) 10 MG tablet TAKE 1 TABLET BY MOUTH ONCE DAILY BEFORE BREAKFAST 90 tablet 3  . LYRICA 100 MG capsule Take 100 mg by mouth 2 (two) times daily as needed. For neuropathic pain  2  . Melatonin 5 MG TABS Take 5 mg by mouth at bedtime.    . metFORMIN (GLUCOPHAGE) 1000 MG tablet TAKE ONE TABLET BY MOUTH TWICE DAILY WITH A MEAL (Patient taking differently: TAKE ONE TABLET (1079m) BY MOUTH TWICE DAILY WITH A MEAL) 60 tablet 0  . metoprolol tartrate (LOPRESSOR) 50 MG tablet Take 1 tablet (50 mg total) by mouth 2 (two) times daily. 180 tablet 3  . nitroGLYCERIN (NITROSTAT) 0.4 MG SL tablet PLACE 1 TABLET UNDER THE TONGUE EVERY 5 MINUTES AS NEEDED FOR CHEST PAIN 25 tablet 3  . nystatin (MYCOSTATIN) 100000 UNIT/ML suspension SWISH WITH 5 MLS IN MOUTH 4 TIMES DAILY FOR 2 MINUTES THEN SWALLOW  3  . Omega-3 Fatty Acids (FISH OIL PO) Take 520 mg by mouth daily.    .Marland KitchenOVER THE COUNTER MEDICATION Apply 1 application topically daily as needed (nereve pain). Theraworx otc pain relief foam    . oxyCODONE (OXY IR/ROXICODONE) 5 MG immediate release tablet Take 1 tablet by mouth every 6 (six) hours as needed.  0  . pantoprazole (PROTONIX) 40 MG tablet TAKE 1 TABLET BY MOUTH TWICE DAILY 180 tablet 1  . Probiotic Product (PROBIOTIC PO) Take 1 tablet by mouth daily.    .Marland Kitchenpyridoxine (B-6) 100 MG tablet Take 100 mg by mouth daily.    . ranolazine (RANEXA) 500 MG 12 hr tablet Take 1 tablet (500  mg total) by mouth 2 (two) times daily. 60 tablet 6  . tiZANidine (ZANAFLEX) 4 MG tablet Take 1 tablet by mouth every 8 (eight) hours as needed.  1  . triamcinolone cream (KENALOG) 0.1 % Apply 1 application topically daily as needed (lichen planus). With cetaphil     No current  facility-administered medications for this visit.     Review of Systems : See HPI for pertinent positives and negatives.  Physical Examination  Vitals:   02/28/18 0918 02/28/18 0920  BP: 117/66 112/65  Pulse: 72   Resp: 16   Temp: (!) 97.5 F (36.4 C)   TempSrc: Oral   SpO2: 98%   Weight: 247 lb 8 oz (112.3 kg)   Height: 5' 11.5" (1.816 m)    Body mass index is 34.04 kg/m.  General: A&O x 3, WDWN obese male Gait: normal HENT: large neck Eyes: PERRLA Pulmonary: Sym exp,respirations are non labored, good air movement in all fields,  CTAB, no rales, rhonchi, or wheezing Cardiac: RRR, Nl S1, S2, no appreciable murmur  Vascular: Vessel Right Left  Radial 2+Palpable 2+Palpable  Carotid Palpable, without bruit Palpable, without bruit  Aorta Not palpable N/A  Popliteal Not palpable Not palpable  PT 2+Palpable 2+Palpable  DP 1+Palpable 1+Palpable   Gastrointestinal: soft, NTND, no G/R, no HSM, no palpable masses, no CVAT B Musculoskeletal: M/S 5/5 throughout, extremities without ischemic changes  Skin: No rashes, no ulcers, no cellulitis.   Neurologic:  A&O X 3; appropriate affect, sensation is normal; speech is normal, CN 2-12 intact, pain and light touch intact in extremities, motor exam as listed above. Psychiatric: Normal thought content, mood appropriate to clinical situation.    Assessment: Seth Young is a 78 y.o. male who is s/p  L CEA (04/23/15). The patient had some visual field loss prior to the left CEA, no subsequent neurological events.  His atherosclerotic risk factors include well controlled DM, former smoker, CAD, OSA, dyslipidemia, and obesity.  He takes a daily ASA, Plavix, and a statin.    DATA Carotid Duplex (02-28-18): Right ICA: 1-39% Left ICA: CEA site with no restenosis, with mild hyperplasia at the surgical bulb. Bilateral vertebral artery flow is antegrade.  Bilateral subclavian artery waveforms are normal.  No significant change in  comparison to the last exams on 02-06-16 and 02-23-17.   Plan: Follow-up in 18 months with Carotid Duplex scan.   I discussed in depth with the patient the nature of atherosclerosis, and emphasized the importance of maximal medical management including strict control of blood pressure, blood glucose, and lipid levels, obtaining regular exercise, and continued cessation of smoking.  The patient is aware that without maximal medical management the underlying atherosclerotic disease process will progress, limiting the benefit of any interventions. The patient was given information about stroke prevention and what symptoms should prompt the patient to seek immediate medical care. Thank you for allowing [place] to participate in this patient's care.  Clemon Chambers, RN, MSN, FNP-C [facility] Office: [phone]  Clinic Physician: Early  02/28/18 9:42 AM

## 2018-03-02 ENCOUNTER — Telehealth: Payer: Self-pay | Admitting: Physician Assistant

## 2018-03-02 ENCOUNTER — Other Ambulatory Visit: Payer: Self-pay | Admitting: Cardiology

## 2018-03-02 NOTE — Telephone Encounter (Signed)
Pt c/o medication issue:  1. Name of Medication: Ranolazine  2. How are you currently taking this medication (dosage and times per day)?  2 times a day 500mg   3. Are you having a reaction (difficulty breathing--STAT)? no  4. What is your medication issue? Making his Vertigo start up again and side effects defeating the purpose of him taking it

## 2018-03-02 NOTE — Telephone Encounter (Signed)
I am sorry he has side effects to this drug. It is ok to stop the Ranexa. Follow up as planned. Richardson Dopp, PA-C    03/02/2018 10:32 AM

## 2018-03-02 NOTE — Telephone Encounter (Signed)
Called patient back. Informed him of Richardson Dopp PA recommendation. Patient will keep his f/u appointment and agreed to plan.

## 2018-03-02 NOTE — Telephone Encounter (Signed)
Called patient back. Patient complaining of vertigo when he takes Renexa. Patient stated he had vertigo for two years and finally stopped having vertigo this year, and then starts Renexa, and it comes back. Patient stated the benefits do not out weigh the side effects for him. Patient stated he has not taking the medication since yesterday and is going to hold it until he hears back from [employer] PA. Will forward to [employer] PA.

## 2018-03-03 ENCOUNTER — Other Ambulatory Visit: Payer: Self-pay

## 2018-03-07 ENCOUNTER — Encounter: Payer: Self-pay | Admitting: Physician Assistant

## 2018-03-07 ENCOUNTER — Ambulatory Visit: Payer: Medicare Other | Admitting: Physician Assistant

## 2018-03-07 ENCOUNTER — Telehealth: Payer: Self-pay | Admitting: *Deleted

## 2018-03-07 VITALS — BP 100/52 | HR 73 | Ht 71.5 in | Wt 243.1 lb

## 2018-03-07 DIAGNOSIS — E785 Hyperlipidemia, unspecified: Secondary | ICD-10-CM | POA: Diagnosis not present

## 2018-03-07 DIAGNOSIS — K219 Gastro-esophageal reflux disease without esophagitis: Secondary | ICD-10-CM | POA: Diagnosis not present

## 2018-03-07 DIAGNOSIS — I10 Essential (primary) hypertension: Secondary | ICD-10-CM

## 2018-03-07 DIAGNOSIS — I25119 Atherosclerotic heart disease of native coronary artery with unspecified angina pectoris: Secondary | ICD-10-CM | POA: Diagnosis not present

## 2018-03-07 LAB — BASIC METABOLIC PANEL
BUN/Creatinine Ratio: 10 (ref 10–24)
BUN: 15 mg/dL (ref 8–27)
CO2: 19 mmol/L — AB (ref 20–29)
Calcium: 9.5 mg/dL (ref 8.6–10.2)
Chloride: 99 mmol/L (ref 96–106)
Creatinine, Ser: 1.49 mg/dL — ABNORMAL HIGH (ref 0.76–1.27)
GFR calc Af Amer: 52 mL/min/{1.73_m2} — ABNORMAL LOW (ref >59)
GFR, EST NON AFRICAN AMERICAN: 45 mL/min/{1.73_m2} — AB (ref >59)
GLUCOSE: 133 mg/dL — AB (ref 65–99)
POTASSIUM: 5.4 mmol/L — AB (ref 3.5–5.2)
SODIUM: 136 mmol/L (ref 134–144)

## 2018-03-07 LAB — CBC
Hematocrit: 42.8 % (ref 37.5–51.0)
Hemoglobin: 13.4 g/dL (ref 13.0–17.7)
MCH: 22.6 pg — AB (ref 26.6–33.0)
MCHC: 31.3 g/dL — ABNORMAL LOW (ref 31.5–35.7)
MCV: 72 fL — ABNORMAL LOW (ref 79–97)
PLATELETS: 187 10*3/uL (ref 150–450)
RBC: 5.92 x10E6/uL — AB (ref 4.14–5.80)
RDW: 17.8 % — AB (ref 12.3–15.4)
WBC: 7.8 10*3/uL (ref 3.4–10.8)

## 2018-03-07 MED ORDER — LISINOPRIL 5 MG PO TABS: 5.0000 mg | ORAL_TABLET | Freq: Every day | ORAL | 3 refills | Status: DC

## 2018-03-07 NOTE — H&P (View-Only) (Signed)
Cardiology Office Note:    Date:  03/07/2018   ID:  Seth Young, DOB 10-May-1941, MRN [id number]  PCP:  Burnard Bunting, MD  Cardiologist:  Candee Furbish, MD   Referring MD: Burnard Bunting, MD   Chief Complaint  Patient presents with  . Follow-up    CAD with angina    History of Present Illness:    Seth Young is a 77 y.o. male with coronary artery disease status post DES to the [place] in 2015, diabetes, hypertension, hyperlipidemia, vertigo, carotid artery disease status post left CEA in 2016.  He has chronic angina.  He has known severe, diffuse disease in the RCA which is best treated medically.  He also has moderate nonobstructive disease in LAD.  His last cardiac catheterization October 2017 demonstrated a patent stent in the OM1.  There was diffuse disease in the RCA.  There was increased stenosis in the PDA.  It was felt that balloon angioplasty could be considered of the PDA if he had refractory angina.  However, this vessel is quite small, making PCI not ideal.    He underwent back surgery in March 2019.  His activity level had been somewhat diminished.  He was last seen in our office February 15, 2018 for evaluation of chest pain.  His discomfort did improve with nitroglycerin.  We discussed intensifying medical therapy versus proceeding with cardiac catheterization.  He preferred medical therapy initially.  I added Ranolazine to his medical regimen.  However, he had significant dizziness with this and had to stop the medication.     [name] returns for follow up.  He is here alone.  He notes no real improvement in his chest symptoms after starting Ranolazine.  He has a house that he goes to in [place] twice a month.  He has noticed that he has severe chest pain if he goes up all 13 steps.  He has to stop after every 3-4 steps to prevent symptoms.  He also notes dyspnea on exertion. He has to stop often while swimming for exercise due to shortness of breath and chest pain.  He denies  syncope.  He does feel weak in the AM. He wonders if this is related to low blood pressure. He denies paroxysmal nocturnal dyspnea, edema.  He denies bleeding issues.   Prior CV studies:   The following studies were reviewed today:  Carotid US 02/28/2018 R 1-39; L CEA patent with mild hyperplasia in the surgical bulb  Echocardiogram 02/15/2018 Mild LVH, EF 60-65, normal wall motion, grade 1 diastolic dysfunction, trivial AI, MAC  Cardiac catheterization 05/13/2016 EF 65 OM1 stent patent RCA diffuse disease, mid 60; PDA 80; increased stenosis in the distal segment of the small acute marginal  Echocardiogram 07/21/2015 Severe focal basal and mild concentric LVH, EF 65-70, normal wall motion, grade 1 diastolic dysfunction, mild to moderate AI, mild TR  Cardiac catheterization 08/07/2014 LM patent LAD proximal 20-30, proximal-mid 40-50; D1 ostial 30-40, mid 60-70 LCx patent; OM1 stent patent; AV circumflex ostial and proximal 40-50 RCA proximal 50, mid 70-80, distal vessel and PDA 95 (subtotal occlusion) leading into tiny PDA branch; acute marginal 50-60 Recommendations:The patient's CAD is stable. His recently implanted stent is widely patent. His LAD stenosis is actually improved from the previous study (likely because of IC NTG). I do not think his right PDA is graftable or treatable with PCI and would favor ongoing medical therapy. Will add low-dose isosorbide and arrange follow-up with Dr Mare Ferrari.  Stress test  07/30/2014 High risk stress nuclear study Moderate area of anterior and apical wall ischemiaas well as inferior wall ischemia from apex to base.  LV Ejection Fraction: 56%. LV Wall Motion: NL LV Function; NL Wall Motion  Past Medical History:  Diagnosis Date  . Arthritis    "knees, toes, hands" (06/26/2014)  . Ataxia   . Basal cell carcinoma    left temporal area-no residual  . Colon polyps 10-12-11   past hx.  . Exertional angina ([place]) 06/26/2014   Cath with DES to  the OM, Bioflow protocol   . GERD (gastroesophageal reflux disease)   . H/O hiatal hernia    no problems  . High cholesterol   . History of echocardiogram    Echo 7/19:  Mild LVH, mild focal basal septal hypertrophy, EF 60-65, no RWMA, Gr 1 DD, trivial AI, MAC  . Hypertension   . Ischemic chest pain 08/07/2014  . Occasional tremors    Bilateral hands  . Peripheral neuropathy   . Pneumonia 1940's X 2  . Pseudogout    "it moves around"  . Sleep apnea    no cpap used  . Type II diabetes mellitus ([place]) dx'd 1982   nsulin started ~ 2000  . Vertigo    Surgical Hx: The patient  has a past surgical history that includes Back surgery; Nasal septum surgery (1979); Carpal tunnel release (Left, 10/2009); Knee arthroscopy (Left, 05/2011); Shoulder open rotator cuff repair (10/14/2011); Subacromial decompression (10/14/2011); Cardiac catheterization (02/2005); Coronary angioplasty with stent (06/26/2014); Posterior lumbar fusion (1964); Carpal tunnel release (Right, ~ 2013); Cataract extraction w/ intraocular lens implant (Left, 2000's); Cataract extraction w/ intraocular lens implant (Right, 2015); Elbow surgery (Bilateral, ~ 2014); Trigger finger release (Right, ~ 2013-2014); Excision basal cell carcinoma (Left, ~ 2012); Coronary angioplasty with stent (06/26/2014); left heart catheterization with coronary angiogram (N/A, 06/26/2014); left heart catheterization with coronary angiogram (N/A, 08/07/2014); Colonoscopy w/ polypectomy; Endarterectomy (Left, 04/23/2015); Patch angioplasty (Left, 04/23/2015); Carotid endarterectomy (Left, Ssept. 14, 2016); Cardiac catheterization (N/A, 05/13/2016); and Lumbar laminectomy/decompression microdiscectomy (Left, 11/02/2017).   Current Medications: Current Meds  Medication Sig  . acetaminophen (TYLENOL) 325 MG tablet Take 650 mg by mouth every 6 (six) hours as needed for moderate pain or headache.   Marland Kitchen alum & mag hydroxide-simeth (MAALOX/MYLANTA) 200-200-20 MG/5ML  suspension Take 15 mLs by mouth every 6 (six) hours as needed for indigestion or heartburn.  Marland Kitchen aspirin 81 MG tablet Take 1 tablet (81 mg total) by mouth daily.  Marland Kitchen atorvastatin (LIPITOR) 40 MG tablet Take 40 mg by mouth daily.  . Calcium Carbonate-Simethicone (ALKA-SELTZER HEARTBURN + GAS) 750-80 MG CHEW Chew 2 tablets by mouth daily as needed (heartburn).  . chlorhexidine (PERIDEX) 0.12 % solution SWISH WITH 1 2 OUNCE FOR 30 SECONDS THEN SPIT TWICE DAILY.  Marland Kitchen clopidogrel (PLAVIX) 75 MG tablet TAKE 1 TABLET BY MOUTH ONCE DAILY  . colchicine 0.6 MG tablet Take 0.6 mg by mouth daily as needed (gout).   . Cyanocobalamin (B-12) 5000 MCG CAPS Take 5,000 mcg by mouth daily.  . dorzolamide-timolol (COSOPT) 22.3-6.8 MG/ML ophthalmic solution Place 1 drop into both eyes 2 (two) times daily.  . empagliflozin (JARDIANCE) 25 MG TABS tablet Take 25 mg by mouth daily.  Marland Kitchen guaiFENesin (MUCINEX) 600 MG 12 hr tablet Take 600 mg by mouth 2 (two) times daily as needed for cough.  Marland Kitchen HUMALOG KWIKPEN 200 UNIT/ML SOPN Inject 10-22 Units as directed 3 (three) times daily. SLIDING SCALE  . Hypromellose (ARTIFICIAL TEARS OP) Place  1 drop into both eyes daily as needed (dry eyes).  Marland Kitchen ibuprofen (ADVIL,MOTRIN) 200 MG tablet Take 400 mg by mouth every 6 (six) hours as needed for moderate pain.  Marland Kitchen insulin glargine (LANTUS) 100 UNIT/ML injection Inject 75-80 Units into the skin at bedtime.   . Insulin Pen Needle (FIFTY50 PEN NEEDLES) 31G X 8 MM MISC   . isosorbide mononitrate (IMDUR) 60 MG 24 hr tablet TAKE 1 & 1/2 (ONE & ONE-HALF) TABLETS BY MOUTH  DAILY  . lisinopril (PRINIVIL,ZESTRIL) 10 MG tablet TAKE 1 TABLET BY MOUTH ONCE DAILY BEFORE BREAKFAST  . LYRICA 100 MG capsule Take 100 mg by mouth 2 (two) times daily as needed. For neuropathic pain  . Melatonin 5 MG TABS Take 5 mg by mouth at bedtime.  . metFORMIN (GLUCOPHAGE) 1000 MG tablet TAKE ONE TABLET BY MOUTH TWICE DAILY WITH A MEAL (Patient taking differently: TAKE ONE  TABLET (1035m) BY MOUTH TWICE DAILY WITH A MEAL)  . metoprolol tartrate (LOPRESSOR) 50 MG tablet TAKE 1 TABLET BY MOUTH TWICE DAILY  . nitroGLYCERIN (NITROSTAT) 0.4 MG SL tablet PLACE 1 TABLET UNDER THE TONGUE EVERY 5 MINUTES AS NEEDED FOR CHEST PAIN  . nystatin (MYCOSTATIN) 100000 UNIT/ML suspension SWISH WITH 5 MLS IN MOUTH 4 TIMES DAILY FOR 2 MINUTES THEN SWALLOW  . Omega-3 Fatty Acids (FISH OIL PO) Take 520 mg by mouth daily.  .Marland KitchenOVER THE COUNTER MEDICATION Apply 1 application topically daily as needed (nereve pain). Theraworx otc pain relief foam  . oxyCODONE (OXY IR/ROXICODONE) 5 MG immediate release tablet Take 1 tablet by mouth every 6 (six) hours as needed.  . pantoprazole (PROTONIX) 40 MG tablet TAKE 1 TABLET BY MOUTH TWICE DAILY  . Probiotic Product (PROBIOTIC PO) Take 1 tablet by mouth daily.  .Marland Kitchenpyridoxine (B-6) 100 MG tablet Take 100 mg by mouth daily.  . ranolazine (RANEXA) 500 MG 12 hr tablet Take 1 tablet (500 mg total) by mouth 2 (two) times daily.  .Marland KitchentiZANidine (ZANAFLEX) 4 MG tablet Take 1 tablet by mouth every 8 (eight) hours as needed.  . triamcinolone cream (KENALOG) 0.1 % Apply 1 application topically daily as needed (lichen planus). With cetaphil     Allergies:   Gabapentin; Morphine and related; and Naproxen   Social History   Tobacco Use  . Smoking status: Former Smoker    Packs/day: 0.50    Years: 20.00    Pack years: 10.00    Types: Cigarettes    Last attempt to quit: 10/11/1988    Years since quitting: 29.4  . Smokeless tobacco: Never Used  Substance Use Topics  . Alcohol use: No    Alcohol/week: 0.0 oz    Comment: Quit drinking in 1990  . Drug use: No     Family Hx: The patient's family history includes Cancer in his sister; Diabetes in his brother, mother, and sister; Heart attack (age of onset: 650 in his father; Hypertension in his brother and sister; Varicose Veins in his mother and sister.  ROS:   Please see the history of present illness.      Review of Systems  Constitution: Positive for decreased appetite and malaise/fatigue.  Cardiovascular: Positive for chest pain and dyspnea on exertion.  Musculoskeletal: Positive for back pain.  Neurological: Positive for dizziness.   All other systems reviewed and are negative.   EKGs/Labs/Other Test Reviewed:    EKG:  EKG is not ordered today.    Recent Labs: 11/02/2017: BUN 13; Creatinine, Ser 1.26; Hemoglobin 13.2;  Platelets 197; Potassium 4.1; Sodium 138   Recent Lipid Panel No results found for: CHOL, TRIG, HDL, CHOLHDL, LDLCALC, LDLDIRECT  Physical Exam:    VS:  BP (!) 100/52   Pulse 73   Ht 5' 11.5" (1.816 m)   Wt 243 lb 1.9 oz (110.3 kg)   SpO2 97%   BMI 33.44 kg/m     Wt Readings from Last 3 Encounters:  03/07/18 243 lb 1.9 oz (110.3 kg)  02/28/18 247 lb 8 oz (112.3 kg)  02/15/18 246 lb 12.8 oz (111.9 kg)     Physical Exam  Constitutional: He is oriented to person, place, and time. He appears well-developed and well-nourished. No distress.  HENT:  Head: Normocephalic and atraumatic.  Eyes: No scleral icterus.  Neck: No JVD present.  Cardiovascular: Normal rate and regular rhythm.  No murmur heard. Pulmonary/Chest: Effort normal. He has no rales.  Abdominal: Soft.  Musculoskeletal: He exhibits no edema.  Neurological: He is alert and oriented to person, place, and time.  Skin: Skin is warm and dry.  Psychiatric: He has a normal mood and affect.    ASSESSMENT & PLAN:    Coronary artery disease involving native coronary artery of native heart with angina pectoris ([place]) Hx of DES to the OM1.  He has chronic angina related to severe, diffuse disease in the RCA.  He recently has noted worsening symptoms.  His blood pressure limits further titration of his antianginal therapy.  He could not tolerate ranolazine.  As he is having refractory angina despite maximal medical therapy, I have recommended proceeding with cardiac catheterization.  I discussed this with  Dr. Marlou Porch who agreed.  He may need angioplasty of the small PDA that had severe disease at catheterization in 2017.  Risks and benefits of cardiac catheterization have been discussed with the patient.  These include bleeding, infection, kidney damage, stroke, heart attack, death.  The patient understands these risks and is willing to proceed.   -Proceed with Cardiac Catheterization this week  -Continue ASA, Plavix, Atorvastatin, Isosorbide, Metoprolol Tartrate  Essential hypertension Blood pressure is running low.  He does feel weak at times in the mornings.  I will decrease his lisinopril to 5 mg daily.  Hyperlipidemia, unspecified hyperlipidemia type Continue statin therapy.  Gastroesophageal reflux disease without esophagitis Recent adjustments in his proton pump inhibitor therapy did not result in significant improvement in his chest symptoms.   Dispo:  Return in about 2 weeks (around 03/21/2018) for Post Procedure Follow Up w/ Dr. Marlou Porch .   Medication Adjustments/Labs and Tests Ordered: Current medicines are reviewed at length with the patient today.  Concerns regarding medicines are outlined above.  Tests Ordered: No orders of the defined types were placed in this encounter.  Medication Changes: No orders of the defined types were placed in this encounter.   Signed, Richardson Dopp, PA-C  03/07/2018 10:05 AM    [facility] [place], [place],   [address] Phone: [phone]; Fax: [phone]

## 2018-03-07 NOTE — Progress Notes (Signed)
Cardiology Office Note:    Date:  03/07/2018   ID:  Seth Young, DOB 10-May-1941, MRN [id number]  PCP:  Burnard Bunting, MD  Cardiologist:  Candee Furbish, MD   Referring MD: Burnard Bunting, MD   Chief Complaint  Patient presents with  . Follow-up    CAD with angina    History of Present Illness:    Seth Young is a 77 y.o. male with coronary artery disease status post DES to the [place] in 2015, diabetes, hypertension, hyperlipidemia, vertigo, carotid artery disease status post left CEA in 2016.  He has chronic angina.  He has known severe, diffuse disease in the RCA which is best treated medically.  He also has moderate nonobstructive disease in LAD.  His last cardiac catheterization October 2017 demonstrated a patent stent in the OM1.  There was diffuse disease in the RCA.  There was increased stenosis in the PDA.  It was felt that balloon angioplasty could be considered of the PDA if he had refractory angina.  However, this vessel is quite small, making PCI not ideal.    He underwent back surgery in March 2019.  His activity level had been somewhat diminished.  He was last seen in our office February 15, 2018 for evaluation of chest pain.  His discomfort did improve with nitroglycerin.  We discussed intensifying medical therapy versus proceeding with cardiac catheterization.  He preferred medical therapy initially.  I added Ranolazine to his medical regimen.  However, he had significant dizziness with this and had to stop the medication.     Seth Young returns for follow up.  He is here alone.  He notes no real improvement in his chest symptoms after starting Ranolazine.  He has a house that he goes to in [place] twice a month.  He has noticed that he has severe chest pain if he goes up all 13 steps.  He has to stop after every 3-4 steps to prevent symptoms.  He also notes dyspnea on exertion. He has to stop often while swimming for exercise due to shortness of breath and chest pain.  He denies  syncope.  He does feel weak in the AM. He wonders if this is related to low blood pressure. He denies paroxysmal nocturnal dyspnea, edema.  He denies bleeding issues.   Prior CV studies:   The following studies were reviewed today:  Carotid US 02/28/2018 R 1-39; L CEA patent with mild hyperplasia in the surgical bulb  Echocardiogram 02/15/2018 Mild LVH, EF 60-65, normal wall motion, grade 1 diastolic dysfunction, trivial AI, MAC  Cardiac catheterization 05/13/2016 EF 65 OM1 stent patent RCA diffuse disease, mid 60; PDA 80; increased stenosis in the distal segment of the small acute marginal  Echocardiogram 07/21/2015 Severe focal basal and mild concentric LVH, EF 65-70, normal wall motion, grade 1 diastolic dysfunction, mild to moderate AI, mild TR  Cardiac catheterization 08/07/2014 LM patent LAD proximal 20-30, proximal-mid 40-50; D1 ostial 30-40, mid 60-70 LCx patent; OM1 stent patent; AV circumflex ostial and proximal 40-50 RCA proximal 50, mid 70-80, distal vessel and PDA 95 (subtotal occlusion) leading into tiny PDA branch; acute marginal 50-60 Recommendations:The patient's CAD is stable. His recently implanted stent is widely patent. His LAD stenosis is actually improved from the previous study (likely because of IC NTG). I do not think his right PDA is graftable or treatable with PCI and would favor ongoing medical therapy. Will add low-dose isosorbide and arrange follow-up with Dr Mare Ferrari.  Stress test  07/30/2014 High risk stress nuclear study Moderate area of anterior and apical wall ischemiaas well as inferior wall ischemia from apex to base.  LV Ejection Fraction: 56%. LV Wall Motion: NL LV Function; NL Wall Motion  Past Medical History:  Diagnosis Date  . Arthritis    "knees, toes, hands" (06/26/2014)  . Ataxia   . Basal cell carcinoma    left temporal area-no residual  . Colon polyps 10-12-11   past hx.  . Exertional angina ([place]) 06/26/2014   Cath with DES to  the OM, Bioflow protocol   . GERD (gastroesophageal reflux disease)   . H/O hiatal hernia    no problems  . High cholesterol   . History of echocardiogram    Echo 7/19:  Mild LVH, mild focal basal septal hypertrophy, EF 60-65, no RWMA, Gr 1 DD, trivial AI, MAC  . Hypertension   . Ischemic chest pain 08/07/2014  . Occasional tremors    Bilateral hands  . Peripheral neuropathy   . Pneumonia 1940's X 2  . Pseudogout    "it moves around"  . Sleep apnea    no cpap used  . Type II diabetes mellitus ([place]) dx'd 1982   nsulin started ~ 2000  . Vertigo    Surgical Hx: The patient  has a past surgical history that includes Back surgery; Nasal septum surgery (1979); Carpal tunnel release (Left, 10/2009); Knee arthroscopy (Left, 05/2011); Shoulder open rotator cuff repair (10/14/2011); Subacromial decompression (10/14/2011); Cardiac catheterization (02/2005); Coronary angioplasty with stent (06/26/2014); Posterior lumbar fusion (1964); Carpal tunnel release (Right, ~ 2013); Cataract extraction w/ intraocular lens implant (Left, 2000's); Cataract extraction w/ intraocular lens implant (Right, 2015); Elbow surgery (Bilateral, ~ 2014); Trigger finger release (Right, ~ 2013-2014); Excision basal cell carcinoma (Left, ~ 2012); Coronary angioplasty with stent (06/26/2014); left heart catheterization with coronary angiogram (N/A, 06/26/2014); left heart catheterization with coronary angiogram (N/A, 08/07/2014); Colonoscopy w/ polypectomy; Endarterectomy (Left, 04/23/2015); Patch angioplasty (Left, 04/23/2015); Carotid endarterectomy (Left, Ssept. 14, 2016); Cardiac catheterization (N/A, 05/13/2016); and Lumbar laminectomy/decompression microdiscectomy (Left, 11/02/2017).   Current Medications: Current Meds  Medication Sig  . acetaminophen (TYLENOL) 325 MG tablet Take 650 mg by mouth every 6 (six) hours as needed for moderate pain or headache.   Marland Kitchen alum & mag hydroxide-simeth (MAALOX/MYLANTA) 200-200-20 MG/5ML  suspension Take 15 mLs by mouth every 6 (six) hours as needed for indigestion or heartburn.  Marland Kitchen aspirin 81 MG tablet Take 1 tablet (81 mg total) by mouth daily.  Marland Kitchen atorvastatin (LIPITOR) 40 MG tablet Take 40 mg by mouth daily.  . Calcium Carbonate-Simethicone (ALKA-SELTZER HEARTBURN + GAS) 750-80 MG CHEW Chew 2 tablets by mouth daily as needed (heartburn).  . chlorhexidine (PERIDEX) 0.12 % solution SWISH WITH 1 2 OUNCE FOR 30 SECONDS THEN SPIT TWICE DAILY.  Marland Kitchen clopidogrel (PLAVIX) 75 MG tablet TAKE 1 TABLET BY MOUTH ONCE DAILY  . colchicine 0.6 MG tablet Take 0.6 mg by mouth daily as needed (gout).   . Cyanocobalamin (B-12) 5000 MCG CAPS Take 5,000 mcg by mouth daily.  . dorzolamide-timolol (COSOPT) 22.3-6.8 MG/ML ophthalmic solution Place 1 drop into both eyes 2 (two) times daily.  . empagliflozin (JARDIANCE) 25 MG TABS tablet Take 25 mg by mouth daily.  Marland Kitchen guaiFENesin (MUCINEX) 600 MG 12 hr tablet Take 600 mg by mouth 2 (two) times daily as needed for cough.  Marland Kitchen HUMALOG KWIKPEN 200 UNIT/ML SOPN Inject 10-22 Units as directed 3 (three) times daily. SLIDING SCALE  . Hypromellose (ARTIFICIAL TEARS OP) Place  1 drop into both eyes daily as needed (dry eyes).  Marland Kitchen ibuprofen (ADVIL,MOTRIN) 200 MG tablet Take 400 mg by mouth every 6 (six) hours as needed for moderate pain.  Marland Kitchen insulin glargine (LANTUS) 100 UNIT/ML injection Inject 75-80 Units into the skin at bedtime.   . Insulin Pen Needle (FIFTY50 PEN NEEDLES) 31G X 8 MM MISC   . isosorbide mononitrate (IMDUR) 60 MG 24 hr tablet TAKE 1 & 1/2 (ONE & ONE-HALF) TABLETS BY MOUTH  DAILY  . lisinopril (PRINIVIL,ZESTRIL) 10 MG tablet TAKE 1 TABLET BY MOUTH ONCE DAILY BEFORE BREAKFAST  . LYRICA 100 MG capsule Take 100 mg by mouth 2 (two) times daily as needed. For neuropathic pain  . Melatonin 5 MG TABS Take 5 mg by mouth at bedtime.  . metFORMIN (GLUCOPHAGE) 1000 MG tablet TAKE ONE TABLET BY MOUTH TWICE DAILY WITH A MEAL (Patient taking differently: TAKE ONE  TABLET (1035m) BY MOUTH TWICE DAILY WITH A MEAL)  . metoprolol tartrate (LOPRESSOR) 50 MG tablet TAKE 1 TABLET BY MOUTH TWICE DAILY  . nitroGLYCERIN (NITROSTAT) 0.4 MG SL tablet PLACE 1 TABLET UNDER THE TONGUE EVERY 5 MINUTES AS NEEDED FOR CHEST PAIN  . nystatin (MYCOSTATIN) 100000 UNIT/ML suspension SWISH WITH 5 MLS IN MOUTH 4 TIMES DAILY FOR 2 MINUTES THEN SWALLOW  . Omega-3 Fatty Acids (FISH OIL PO) Take 520 mg by mouth daily.  .Marland KitchenOVER THE COUNTER MEDICATION Apply 1 application topically daily as needed (nereve pain). Theraworx otc pain relief foam  . oxyCODONE (OXY IR/ROXICODONE) 5 MG immediate release tablet Take 1 tablet by mouth every 6 (six) hours as needed.  . pantoprazole (PROTONIX) 40 MG tablet TAKE 1 TABLET BY MOUTH TWICE DAILY  . Probiotic Product (PROBIOTIC PO) Take 1 tablet by mouth daily.  .Marland Kitchenpyridoxine (B-6) 100 MG tablet Take 100 mg by mouth daily.  . ranolazine (RANEXA) 500 MG 12 hr tablet Take 1 tablet (500 mg total) by mouth 2 (two) times daily.  .Marland KitchentiZANidine (ZANAFLEX) 4 MG tablet Take 1 tablet by mouth every 8 (eight) hours as needed.  . triamcinolone cream (KENALOG) 0.1 % Apply 1 application topically daily as needed (lichen planus). With cetaphil     Allergies:   Gabapentin; Morphine and related; and Naproxen   Social History   Tobacco Use  . Smoking status: Former Smoker    Packs/day: 0.50    Years: 20.00    Pack years: 10.00    Types: Cigarettes    Last attempt to quit: 10/11/1988    Years since quitting: 29.4  . Smokeless tobacco: Never Used  Substance Use Topics  . Alcohol use: No    Alcohol/week: 0.0 oz    Comment: Quit drinking in 1990  . Drug use: No     Family Hx: The patient's family history includes Cancer in his sister; Diabetes in his brother, mother, and sister; Heart attack (age of onset: 650 in his father; Hypertension in his brother and sister; Varicose Veins in his mother and sister.  ROS:   Please see the history of present illness.      Review of Systems  Constitution: Positive for decreased appetite and malaise/fatigue.  Cardiovascular: Positive for chest pain and dyspnea on exertion.  Musculoskeletal: Positive for back pain.  Neurological: Positive for dizziness.   All other systems reviewed and are negative.   EKGs/Labs/Other Test Reviewed:    EKG:  EKG is not ordered today.    Recent Labs: 11/02/2017: BUN 13; Creatinine, Ser 1.26; Hemoglobin 13.2;  Platelets 197; Potassium 4.1; Sodium 138   Recent Lipid Panel No results found for: CHOL, TRIG, HDL, CHOLHDL, LDLCALC, LDLDIRECT  Physical Exam:    VS:  BP (!) 100/52   Pulse 73   Ht 5' 11.5" (1.816 m)   Wt 243 lb 1.9 oz (110.3 kg)   SpO2 97%   BMI 33.44 kg/m     Wt Readings from Last 3 Encounters:  03/07/18 243 lb 1.9 oz (110.3 kg)  02/28/18 247 lb 8 oz (112.3 kg)  02/15/18 246 lb 12.8 oz (111.9 kg)     Physical Exam  Constitutional: He is oriented to person, place, and time. He appears well-developed and well-nourished. No distress.  HENT:  Head: Normocephalic and atraumatic.  Eyes: No scleral icterus.  Neck: No JVD present.  Cardiovascular: Normal rate and regular rhythm.  No murmur heard. Pulmonary/Chest: Effort normal. He has no rales.  Abdominal: Soft.  Musculoskeletal: He exhibits no edema.  Neurological: He is alert and oriented to person, place, and time.  Skin: Skin is warm and dry.  Psychiatric: He has a normal mood and affect.    ASSESSMENT & PLAN:    Coronary artery disease involving native coronary artery of native heart with angina pectoris ([place]) Hx of DES to the OM1.  He has chronic angina related to severe, diffuse disease in the RCA.  He recently has noted worsening symptoms.  His blood pressure limits further titration of his antianginal therapy.  He could not tolerate ranolazine.  As he is having refractory angina despite maximal medical therapy, I have recommended proceeding with cardiac catheterization.  I discussed this with  Dr. Marlou Porch who agreed.  He may need angioplasty of the small PDA that had severe disease at catheterization in 2017.  Risks and benefits of cardiac catheterization have been discussed with the patient.  These include bleeding, infection, kidney damage, stroke, heart attack, death.  The patient understands these risks and is willing to proceed.   -Proceed with Cardiac Catheterization this week  -Continue ASA, Plavix, Atorvastatin, Isosorbide, Metoprolol Tartrate  Essential hypertension Blood pressure is running low.  He does feel weak at times in the mornings.  I will decrease his lisinopril to 5 mg daily.  Hyperlipidemia, unspecified hyperlipidemia type Continue statin therapy.  Gastroesophageal reflux disease without esophagitis Recent adjustments in his proton pump inhibitor therapy did not result in significant improvement in his chest symptoms.   Dispo:  Return in about 2 weeks (around 03/21/2018) for Post Procedure Follow Up w/ Dr. Marlou Porch .   Medication Adjustments/Labs and Tests Ordered: Current medicines are reviewed at length with the patient today.  Concerns regarding medicines are outlined above.  Tests Ordered: No orders of the defined types were placed in this encounter.  Medication Changes: No orders of the defined types were placed in this encounter.   Signed, Richardson Dopp, PA-C  03/07/2018 10:05 AM    [facility] [place], [place],   [address] Phone: [phone]; Fax: [phone]

## 2018-03-07 NOTE — Telephone Encounter (Signed)
-----   Message from Liliane Shi, [place] sent at 03/07/2018  5:14 PM EDT ----- The following abnormalities are noted: The sugar (glucose) is elevated.  The kidney function (Creatinine) is somewhat weak but stable.  The potassium is elevated.  The blood count (hemoglobin) is normal. All other values are normal, stable or within acceptable limits. Medication changes / Follow up labs / Other changes or recommendations:   1. Hold Lisinopril for now 2. Limit dietary potassium  3. Repeat BMET AM of Cardiac Catheterization  Richardson Dopp, PA-C 03/07/2018 5:10 PM

## 2018-03-07 NOTE — Telephone Encounter (Signed)
Pt has been notified of lab results by phone with verbal understanding. Pt states he has been eating a lot of bananas and tomatoes. I asked pt to hold off on these for a few days. Pt advised to hold Lisinopril until further advised by Cardiology. We will obtain BMET 03/09/18 when he goes in for his cath. Pt thanked me for the call.

## 2018-03-07 NOTE — Patient Instructions (Addendum)
Medication Instructions:  1. DECREASE LISINOPRIL 5 MG DAILY; NEW RX HAS BEEN SENT IN  Labwork: TODAY BMET, CBC   Testing/Procedures: Your physician has requested that you have a cardiac catheterization. Cardiac catheterization is used to diagnose and/or treat various heart conditions. Doctors may recommend this procedure for a number of different reasons. The most common reason is to evaluate chest pain. Chest pain can be a symptom of coronary artery disease (CAD), and cardiac catheterization can show whether plaque is narrowing or blocking your heart's arteries. This procedure is also used to evaluate the valves, as well as measure the blood flow and oxygen levels in different parts of your heart. For further information please visit [email or web address]. Please follow instruction sheet, as given.     Follow-Up: DR. Marlou Porch 03/24/18 @ 8:15 AM ; POST CATH FOLLOW UP   Any Other Special Instructions Will Be Listed Below (If Applicable).   If you need a refill on your cardiac medications before your next appointment, please call your pharmacy.     [facility] [address] [place] [address] [place] [address] Dept: [phone] Loc: [place]  03/07/2018  You are scheduled for a Cardiac Catheterization on Thursday, August 1 with Dr. [name] FOR 1:30 PM.   1. Please arrive at the [facility] (Main Entrance A) at [facility]: [place], [place] [address] at 11:30 AM (This time is two hours before your procedure to ensure your preparation). Free valet parking service is available.   Special note: Every effort is made to have your procedure done on time. Please understand that emergencies sometimes delay scheduled procedures.  2. Diet: Do not eat solid foods after midnight.  The patient may have clear liquids until 5am upon the day of the procedure.  3.  Labs: You will need to have blood drawn on Tuesday, July 30 at [facility] at [facility]. [address]  Open: 7:30am - 5pm    Phone: [phone]. You do not need to be fasting.  4. Medication instructions in preparation for your procedure:   Contrast Allergy: No   TAKE 1/2 DOSE OF YOUR LANTUS THE NIGHT BEFORE CATH ON WED 03/08/18  DO NOT TAKE METFORMIN THE MORNING OF CATH OR FOR 48 HOURS AFTER CATH.   HOLD JARDIANCE THE MORNING OF CATH 03/09/18  DO NOT TAKE ANY NOVOLOG THE MORNING OF CATH   On the morning of your procedure, take your ASPRIN AND PLAVIX and any morning medicines NOT listed above.  You may use sips of water.  5. Plan for one night stay--bring personal belongings. 6. Bring a current list of your medications and current insurance cards. 7. You MUST have a responsible person to drive you home. 8. Someone MUST be with you the first 24 hours after you arrive home or your discharge will be delayed. 9. Please wear clothes that are easy to get on and off and wear slip-on shoes.  Thank you for allowing [place] to care for you!   -- [facility]

## 2018-03-09 ENCOUNTER — Encounter (HOSPITAL_COMMUNITY): Payer: Self-pay | Admitting: General Practice

## 2018-03-09 ENCOUNTER — Other Ambulatory Visit: Payer: Self-pay

## 2018-03-09 ENCOUNTER — Ambulatory Visit (HOSPITAL_COMMUNITY)
Admission: RE | Admit: 2018-03-09 | Discharge: 2018-03-10 | Disposition: A | Discharge status: Home / Self Care | Payer: Medicare Other | Source: Ambulatory Visit | Attending: Cardiovascular Disease | Admitting: Cardiovascular Disease

## 2018-03-09 ENCOUNTER — Encounter (HOSPITAL_COMMUNITY)
Admission: RE | Disposition: A | Discharge status: Home / Self Care | Payer: Self-pay | Source: Ambulatory Visit | Attending: Cardiovascular Disease

## 2018-03-09 DIAGNOSIS — Z981 Arthrodesis status: Secondary | ICD-10-CM | POA: Diagnosis not present

## 2018-03-09 DIAGNOSIS — I25119 Atherosclerotic heart disease of native coronary artery with unspecified angina pectoris: Secondary | ICD-10-CM

## 2018-03-09 DIAGNOSIS — E1140 Type 2 diabetes mellitus with diabetic neuropathy, unspecified: Secondary | ICD-10-CM | POA: Diagnosis not present

## 2018-03-09 DIAGNOSIS — Z888 Allergy status to other drugs, medicaments and biological substances status: Secondary | ICD-10-CM | POA: Insufficient documentation

## 2018-03-09 DIAGNOSIS — M1990 Unspecified osteoarthritis, unspecified site: Secondary | ICD-10-CM | POA: Diagnosis not present

## 2018-03-09 DIAGNOSIS — Z794 Long term (current) use of insulin: Secondary | ICD-10-CM | POA: Diagnosis not present

## 2018-03-09 DIAGNOSIS — K219 Gastro-esophageal reflux disease without esophagitis: Secondary | ICD-10-CM | POA: Diagnosis not present

## 2018-03-09 DIAGNOSIS — Z7982 Long term (current) use of aspirin: Secondary | ICD-10-CM | POA: Diagnosis not present

## 2018-03-09 DIAGNOSIS — Z87891 Personal history of nicotine dependence: Secondary | ICD-10-CM | POA: Insufficient documentation

## 2018-03-09 DIAGNOSIS — I200 Unstable angina: Secondary | ICD-10-CM | POA: Diagnosis present

## 2018-03-09 DIAGNOSIS — G4730 Sleep apnea, unspecified: Secondary | ICD-10-CM | POA: Diagnosis not present

## 2018-03-09 DIAGNOSIS — R42 Dizziness and giddiness: Secondary | ICD-10-CM | POA: Diagnosis not present

## 2018-03-09 DIAGNOSIS — Z885 Allergy status to narcotic agent status: Secondary | ICD-10-CM | POA: Diagnosis not present

## 2018-03-09 DIAGNOSIS — R079 Chest pain, unspecified: Secondary | ICD-10-CM

## 2018-03-09 DIAGNOSIS — Z886 Allergy status to analgesic agent status: Secondary | ICD-10-CM | POA: Diagnosis not present

## 2018-03-09 DIAGNOSIS — E785 Hyperlipidemia, unspecified: Secondary | ICD-10-CM | POA: Diagnosis not present

## 2018-03-09 DIAGNOSIS — Z833 Family history of diabetes mellitus: Secondary | ICD-10-CM | POA: Diagnosis not present

## 2018-03-09 DIAGNOSIS — I25110 Atherosclerotic heart disease of native coronary artery with unstable angina pectoris: Secondary | ICD-10-CM

## 2018-03-09 DIAGNOSIS — I219 Acute myocardial infarction, unspecified: Secondary | ICD-10-CM | POA: Diagnosis not present

## 2018-03-09 DIAGNOSIS — I10 Essential (primary) hypertension: Secondary | ICD-10-CM | POA: Insufficient documentation

## 2018-03-09 DIAGNOSIS — Z9889 Other specified postprocedural states: Secondary | ICD-10-CM | POA: Insufficient documentation

## 2018-03-09 DIAGNOSIS — Z7902 Long term (current) use of antithrombotics/antiplatelets: Secondary | ICD-10-CM | POA: Insufficient documentation

## 2018-03-09 DIAGNOSIS — Z9841 Cataract extraction status, right eye: Secondary | ICD-10-CM | POA: Diagnosis not present

## 2018-03-09 DIAGNOSIS — Z86010 Personal history of colonic polyps: Secondary | ICD-10-CM | POA: Insufficient documentation

## 2018-03-09 DIAGNOSIS — Z8249 Family history of ischemic heart disease and other diseases of the circulatory system: Secondary | ICD-10-CM | POA: Insufficient documentation

## 2018-03-09 DIAGNOSIS — Z85828 Personal history of other malignant neoplasm of skin: Secondary | ICD-10-CM | POA: Diagnosis not present

## 2018-03-09 DIAGNOSIS — Z955 Presence of coronary angioplasty implant and graft: Secondary | ICD-10-CM | POA: Insufficient documentation

## 2018-03-09 DIAGNOSIS — Z79899 Other long term (current) drug therapy: Secondary | ICD-10-CM | POA: Diagnosis not present

## 2018-03-09 DIAGNOSIS — R0789 Other chest pain: Secondary | ICD-10-CM

## 2018-03-09 HISTORY — PX: LEFT HEART CATH AND CORONARY ANGIOGRAPHY: CATH118249

## 2018-03-09 HISTORY — PX: CORONARY ANGIOPLASTY WITH STENT PLACEMENT: SHX49

## 2018-03-09 HISTORY — PX: CORONARY STENT INTERVENTION: CATH118234

## 2018-03-09 HISTORY — DX: Atherosclerotic heart disease of native coronary artery without angina pectoris: I25.10

## 2018-03-09 LAB — BASIC METABOLIC PANEL
Anion gap: 11 (ref 5–15)
BUN: 15 mg/dL (ref 8–23)
CO2: 22 mmol/L (ref 22–32)
CREATININE: 1.19 mg/dL (ref 0.61–1.24)
Calcium: 9.7 mg/dL (ref 8.9–10.3)
Chloride: 103 mmol/L (ref 98–111)
GFR calc Af Amer: >60 mL/min (ref >60)
GFR, EST NON AFRICAN AMERICAN: 57 mL/min — AB (ref >60)
Glucose, Bld: 163 mg/dL — ABNORMAL HIGH (ref 70–99)
Potassium: 4.6 mmol/L (ref 3.5–5.1)
SODIUM: 136 mmol/L (ref 135–145)

## 2018-03-09 LAB — GLUCOSE, CAPILLARY
GLUCOSE-CAPILLARY: 168 mg/dL — AB (ref 70–99)
Glucose-Capillary: 147 mg/dL — ABNORMAL HIGH (ref 70–99)
Glucose-Capillary: 153 mg/dL — ABNORMAL HIGH (ref 70–99)

## 2018-03-09 LAB — POCT ACTIVATED CLOTTING TIME
ACTIVATED CLOTTING TIME: 257 s
ACTIVATED CLOTTING TIME: 257 s

## 2018-03-09 SURGERY — LEFT HEART CATH AND CORONARY ANGIOGRAPHY
Anesthesia: LOCAL

## 2018-03-09 MED ORDER — NITROGLYCERIN 0.4 MG SL SUBL: 0.4000 mg | SUBLINGUAL_TABLET | SUBLINGUAL | Status: DC | PRN

## 2018-03-09 MED ORDER — HYDRALAZINE HCL 20 MG/ML IJ SOLN: 5.0000 mg | INTRAMUSCULAR | Status: AC | PRN

## 2018-03-09 MED ORDER — HEPARIN SODIUM (PORCINE) 1000 UNIT/ML IJ SOLN
INTRAMUSCULAR | Status: DC | PRN
  Administered 2018-03-09: 5000 [IU] via INTRAVENOUS
  Administered 2018-03-09: 7000 [IU] via INTRAVENOUS

## 2018-03-09 MED ORDER — CLOPIDOGREL BISULFATE 75 MG PO TABS
ORAL_TABLET | ORAL | Status: AC
  Filled 2018-03-09: qty 3

## 2018-03-09 MED ORDER — TIZANIDINE HCL 4 MG PO TABS
4.0000 mg | ORAL_TABLET | Freq: Every day | ORAL | Status: DC
  Administered 2018-03-09: 22:00:00 4 mg via ORAL
  Filled 2018-03-09: qty 1

## 2018-03-09 MED ORDER — SODIUM CHLORIDE 0.9 % WEIGHT BASED INFUSION: 3.0000 mL/kg/h | INTRAVENOUS | Status: DC

## 2018-03-09 MED ORDER — HEPARIN SODIUM (PORCINE) 1000 UNIT/ML IJ SOLN
INTRAMUSCULAR | Status: DC | PRN
  Administered 2018-03-09: 2000 [IU] via INTRAVENOUS

## 2018-03-09 MED ORDER — SODIUM CHLORIDE 0.9 % IV SOLN: 250.0000 mL | INTRAVENOUS | Status: DC | PRN

## 2018-03-09 MED ORDER — MELATONIN 3 MG PO TABS
3.0000 mg | ORAL_TABLET | Freq: Every day | ORAL | Status: DC
  Administered 2018-03-09: 3 mg via ORAL
  Filled 2018-03-09: qty 1

## 2018-03-09 MED ORDER — VERAPAMIL HCL 2.5 MG/ML IV SOLN
INTRAVENOUS | Status: AC
  Filled 2018-03-09: qty 2

## 2018-03-09 MED ORDER — ASPIRIN 81 MG PO CHEW
81.0000 mg | CHEWABLE_TABLET | Freq: Every day | ORAL | Status: DC
  Administered 2018-03-10: 09:00:00 81 mg via ORAL
  Filled 2018-03-09: qty 1

## 2018-03-09 MED ORDER — MIDAZOLAM HCL 2 MG/2ML IJ SOLN
INTRAMUSCULAR | Status: AC
  Filled 2018-03-09: qty 2

## 2018-03-09 MED ORDER — SODIUM CHLORIDE 0.9% FLUSH
3.0000 mL | Freq: Two times a day (BID) | INTRAVENOUS | Status: DC
  Administered 2018-03-09: 3 mL via INTRAVENOUS

## 2018-03-09 MED ORDER — ANGIOPLASTY BOOK
Freq: Once | Status: DC
  Filled 2018-03-09: qty 1

## 2018-03-09 MED ORDER — SODIUM CHLORIDE 0.9 % IV SOLN: INTRAVENOUS | Status: AC

## 2018-03-09 MED ORDER — FENTANYL CITRATE (PF) 100 MCG/2ML IJ SOLN
INTRAMUSCULAR | Status: DC | PRN
  Administered 2018-03-09 (×2): 25 ug via INTRAVENOUS

## 2018-03-09 MED ORDER — ISOSORBIDE MONONITRATE ER 30 MG PO TB24
45.0000 mg | ORAL_TABLET | Freq: Every day | ORAL | Status: DC
  Administered 2018-03-09 – 2018-03-10 (×2): 45 mg via ORAL
  Filled 2018-03-09 (×2): qty 2

## 2018-03-09 MED ORDER — INSULIN GLARGINE 100 UNIT/ML ~~LOC~~ SOLN
80.0000 [IU] | Freq: Every day | SUBCUTANEOUS | Status: DC
  Administered 2018-03-09: 22:00:00 80 [IU] via SUBCUTANEOUS
  Filled 2018-03-09: qty 0.8

## 2018-03-09 MED ORDER — SODIUM CHLORIDE 0.9 % WEIGHT BASED INFUSION: 1.0000 mL/kg/h | INTRAVENOUS | Status: DC

## 2018-03-09 MED ORDER — HEPARIN (PORCINE) IN NACL 1000-0.9 UT/500ML-% IV SOLN
INTRAVENOUS | Status: AC
  Filled 2018-03-09: qty 1000

## 2018-03-09 MED ORDER — LIDOCAINE HCL (PF) 1 % IJ SOLN
INTRAMUSCULAR | Status: AC
  Filled 2018-03-09: qty 30

## 2018-03-09 MED ORDER — SODIUM CHLORIDE 0.9% FLUSH: 3.0000 mL | INTRAVENOUS | Status: DC | PRN

## 2018-03-09 MED ORDER — CLOPIDOGREL BISULFATE 75 MG PO TABS
ORAL_TABLET | ORAL | Status: DC | PRN
  Administered 2018-03-09: 225 mg via ORAL

## 2018-03-09 MED ORDER — IOPAMIDOL (ISOVUE-370) INJECTION 76%
INTRAVENOUS | Status: AC
  Filled 2018-03-09: qty 50

## 2018-03-09 MED ORDER — IOPAMIDOL (ISOVUE-370) INJECTION 76%
INTRAVENOUS | Status: DC | PRN
  Administered 2018-03-09: 130 mL via INTRA_ARTERIAL

## 2018-03-09 MED ORDER — DORZOLAMIDE HCL-TIMOLOL MAL 2-0.5 % OP SOLN
1.0000 [drp] | Freq: Two times a day (BID) | OPHTHALMIC | Status: DC
  Administered 2018-03-09 – 2018-03-10 (×2): 1 [drp] via OPHTHALMIC
  Filled 2018-03-09: qty 10

## 2018-03-09 MED ORDER — ONDANSETRON HCL 4 MG/2ML IJ SOLN: 4.0000 mg | Freq: Four times a day (QID) | INTRAMUSCULAR | Status: DC | PRN

## 2018-03-09 MED ORDER — MIDAZOLAM HCL 2 MG/2ML IJ SOLN
INTRAMUSCULAR | Status: DC | PRN
  Administered 2018-03-09 (×2): 1 mg via INTRAVENOUS

## 2018-03-09 MED ORDER — METOPROLOL TARTRATE 25 MG PO TABS
50.0000 mg | ORAL_TABLET | Freq: Two times a day (BID) | ORAL | Status: DC
Start: 2018-03-09 — End: 2018-03-10
  Administered 2018-03-09 – 2018-03-10 (×2): 50 mg via ORAL
  Filled 2018-03-09 (×2): qty 2

## 2018-03-09 MED ORDER — HEPARIN SODIUM (PORCINE) 1000 UNIT/ML IJ SOLN
INTRAMUSCULAR | Status: AC
  Filled 2018-03-09: qty 1

## 2018-03-09 MED ORDER — MELATONIN 5 MG PO TABS
5.0000 mg | ORAL_TABLET | Freq: Every day | ORAL | Status: DC
Start: 2018-03-09 — End: 2018-03-09
  Filled 2018-03-09 (×2): qty 1

## 2018-03-09 MED ORDER — LABETALOL HCL 5 MG/ML IV SOLN: 10.0000 mg | INTRAVENOUS | Status: AC | PRN

## 2018-03-09 MED ORDER — HEPARIN (PORCINE) IN NACL 2-0.9 UNITS/ML
INTRAMUSCULAR | Status: DC | PRN
  Administered 2018-03-09: 10 mL via INTRA_ARTERIAL

## 2018-03-09 MED ORDER — CLOPIDOGREL BISULFATE 75 MG PO TABS
75.0000 mg | ORAL_TABLET | Freq: Every day | ORAL | Status: DC
  Administered 2018-03-10: 75 mg via ORAL
  Filled 2018-03-09: qty 1

## 2018-03-09 MED ORDER — ASPIRIN 81 MG PO CHEW: 81.0000 mg | CHEWABLE_TABLET | ORAL | Status: DC

## 2018-03-09 MED ORDER — FENTANYL CITRATE (PF) 100 MCG/2ML IJ SOLN
INTRAMUSCULAR | Status: AC
  Filled 2018-03-09: qty 2

## 2018-03-09 MED ORDER — ATORVASTATIN CALCIUM 40 MG PO TABS
40.0000 mg | ORAL_TABLET | Freq: Every day | ORAL | Status: DC
Start: 2018-03-09 — End: 2018-03-10
  Administered 2018-03-09: 40 mg via ORAL
  Filled 2018-03-09: qty 1

## 2018-03-09 MED ORDER — PANTOPRAZOLE SODIUM 40 MG PO TBEC: 40.0000 mg | DELAYED_RELEASE_TABLET | Freq: Two times a day (BID) | ORAL | Status: DC

## 2018-03-09 MED ORDER — HEPARIN (PORCINE) IN NACL 1000-0.9 UT/500ML-% IV SOLN
INTRAVENOUS | Status: DC | PRN
  Administered 2018-03-09 (×2): 500 mL

## 2018-03-09 MED ORDER — LIDOCAINE HCL (PF) 1 % IJ SOLN
INTRAMUSCULAR | Status: DC | PRN
  Administered 2018-03-09: 2 mL

## 2018-03-09 MED ORDER — ACETAMINOPHEN 325 MG PO TABS
650.0000 mg | ORAL_TABLET | ORAL | Status: DC | PRN
  Administered 2018-03-09: 22:00:00 650 mg via ORAL
  Filled 2018-03-09: qty 2

## 2018-03-09 SURGICAL SUPPLY — 15 items
BALLN SAPPHIRE 2.5X12 (BALLOONS) ×2
BALLN SAPPHIRE ~~LOC~~ 3.0X12 (BALLOONS) ×2 IMPLANT
BALLOON SAPPHIRE 2.5X12 (BALLOONS) ×1 IMPLANT
CATH 5FR JL3.5 JR4 ANG PIG MP (CATHETERS) ×2 IMPLANT
CATH VISTA GUIDE 6FR XBLAD3.5 (CATHETERS) ×2 IMPLANT
DEVICE RAD COMP TR BAND LRG (VASCULAR PRODUCTS) ×2 IMPLANT
GLIDESHEATH SLEND SS 6F .021 (SHEATH) ×2 IMPLANT
GUIDEWIRE INQWIRE 1.5J.035X260 (WIRE) ×1 IMPLANT
INQWIRE 1.5J .035X260CM (WIRE) ×2
KIT HEART LEFT (KITS) ×2 IMPLANT
PACK CARDIAC CATHETERIZATION (CUSTOM PROCEDURE TRAY) ×2 IMPLANT
STENT SIERRA 3.00 X 15 MM (Permanent Stent) ×2 IMPLANT
TRANSDUCER W/STOPCOCK (MISCELLANEOUS) ×2 IMPLANT
TUBING CIL FLEX 10 FLL-RA (TUBING) ×2 IMPLANT
WIRE COUGAR XT STRL 190CM (WIRE) ×2 IMPLANT

## 2018-03-09 NOTE — Interval H&P Note (Signed)
History and Physical Interval Note:  03/09/2018 1:38 PM  Seth Young  has presented today for cardiac cath with the diagnosis of CAD with unstable angina. The various methods of treatment have been discussed with the patient and family. After consideration of risks, benefits and other options for treatment, the patient has consented to  Procedure(s): LEFT HEART CATH AND CORONARY ANGIOGRAPHY (N/A) as a surgical intervention .  The patient's history has been reviewed, patient examined, no change in status, stable for surgery.  I have reviewed the patient's chart and labs.  Questions were answered to the patient's satisfaction.    Cath Lab Visit (complete for each Cath Lab visit)  Clinical Evaluation Leading to the Procedure:   ACS: No.  Non-ACS:    Anginal Classification: CCS III  Anti-ischemic medical therapy: Maximal Therapy (2 or more classes of medications)  Non-Invasive Test Results: No non-invasive testing performed  Prior CABG: No previous CABG         Seth Young

## 2018-03-10 ENCOUNTER — Ambulatory Visit: Payer: Medicare Other | Admitting: Physician Assistant

## 2018-03-10 ENCOUNTER — Encounter (HOSPITAL_COMMUNITY): Payer: Self-pay | Admitting: Cardiovascular Disease

## 2018-03-10 DIAGNOSIS — Z886 Allergy status to analgesic agent status: Secondary | ICD-10-CM | POA: Diagnosis not present

## 2018-03-10 DIAGNOSIS — G4730 Sleep apnea, unspecified: Secondary | ICD-10-CM | POA: Diagnosis not present

## 2018-03-10 DIAGNOSIS — M1990 Unspecified osteoarthritis, unspecified site: Secondary | ICD-10-CM | POA: Diagnosis not present

## 2018-03-10 DIAGNOSIS — I200 Unstable angina: Secondary | ICD-10-CM | POA: Diagnosis not present

## 2018-03-10 DIAGNOSIS — K219 Gastro-esophageal reflux disease without esophagitis: Secondary | ICD-10-CM | POA: Diagnosis not present

## 2018-03-10 DIAGNOSIS — I25110 Atherosclerotic heart disease of native coronary artery with unstable angina pectoris: Secondary | ICD-10-CM | POA: Diagnosis not present

## 2018-03-10 DIAGNOSIS — Z794 Long term (current) use of insulin: Secondary | ICD-10-CM | POA: Diagnosis not present

## 2018-03-10 DIAGNOSIS — E1140 Type 2 diabetes mellitus with diabetic neuropathy, unspecified: Secondary | ICD-10-CM | POA: Diagnosis not present

## 2018-03-10 DIAGNOSIS — R42 Dizziness and giddiness: Secondary | ICD-10-CM | POA: Diagnosis not present

## 2018-03-10 DIAGNOSIS — Z7982 Long term (current) use of aspirin: Secondary | ICD-10-CM | POA: Diagnosis not present

## 2018-03-10 DIAGNOSIS — Z87891 Personal history of nicotine dependence: Secondary | ICD-10-CM | POA: Diagnosis not present

## 2018-03-10 DIAGNOSIS — Z7902 Long term (current) use of antithrombotics/antiplatelets: Secondary | ICD-10-CM | POA: Diagnosis not present

## 2018-03-10 DIAGNOSIS — Z79899 Other long term (current) drug therapy: Secondary | ICD-10-CM | POA: Diagnosis not present

## 2018-03-10 DIAGNOSIS — Z885 Allergy status to narcotic agent status: Secondary | ICD-10-CM | POA: Diagnosis not present

## 2018-03-10 DIAGNOSIS — E785 Hyperlipidemia, unspecified: Secondary | ICD-10-CM | POA: Diagnosis not present

## 2018-03-10 DIAGNOSIS — I219 Acute myocardial infarction, unspecified: Secondary | ICD-10-CM | POA: Diagnosis not present

## 2018-03-10 DIAGNOSIS — I10 Essential (primary) hypertension: Secondary | ICD-10-CM | POA: Diagnosis not present

## 2018-03-10 LAB — CBC
HCT: 41.6 % (ref 39.0–52.0)
Hemoglobin: 12.1 g/dL — ABNORMAL LOW (ref 13.0–17.0)
MCH: 22 pg — ABNORMAL LOW (ref 26.0–34.0)
MCHC: 29.1 g/dL — AB (ref 30.0–36.0)
MCV: 75.6 fL — AB (ref 78.0–100.0)
PLATELETS: 182 10*3/uL (ref 150–400)
RBC: 5.5 MIL/uL (ref 4.22–5.81)
RDW: 18.8 % — AB (ref 11.5–15.5)
WBC: 6.8 10*3/uL (ref 4.0–10.5)

## 2018-03-10 LAB — BASIC METABOLIC PANEL
Anion gap: 9 (ref 5–15)
BUN: 16 mg/dL (ref 8–23)
CALCIUM: 9.3 mg/dL (ref 8.9–10.3)
CO2: 25 mmol/L (ref 22–32)
CREATININE: 1.19 mg/dL (ref 0.61–1.24)
Chloride: 103 mmol/L (ref 98–111)
GFR calc non Af Amer: 57 mL/min — ABNORMAL LOW (ref >60)
Glucose, Bld: 107 mg/dL — ABNORMAL HIGH (ref 70–99)
Potassium: 4 mmol/L (ref 3.5–5.1)
Sodium: 137 mmol/L (ref 135–145)

## 2018-03-10 LAB — GLUCOSE, CAPILLARY: Glucose-Capillary: 105 mg/dL — ABNORMAL HIGH (ref 70–99)

## 2018-03-10 MED ORDER — ISOSORBIDE MONONITRATE ER 60 MG PO TB24: 60.0000 mg | ORAL_TABLET | Freq: Every day | ORAL | 3 refills | Status: DC

## 2018-03-10 NOTE — Progress Notes (Addendum)
Progress Note  Patient Name: Seth Young Date of Encounter: 03/10/2018  Primary Cardiologist: Candee Furbish, MD   Subjective   Feeling well. No chest pain, sob or palpitations.   Inpatient Medications    Scheduled Meds: . angioplasty book   Does not apply Once  . aspirin  81 mg Oral Daily  . atorvastatin  40 mg Oral q1800  . clopidogrel  75 mg Oral Daily  . dorzolamide-timolol  1 drop Both Eyes BID  . insulin glargine  80 Units Subcutaneous QHS  . isosorbide mononitrate  45 mg Oral Daily  . Melatonin  3 mg Oral QHS  . metoprolol tartrate  50 mg Oral BID  . pantoprazole  40 mg Oral BID  . sodium chloride flush  3 mL Intravenous Q12H  . tiZANidine  4 mg Oral QHS   Continuous Infusions: . sodium chloride     PRN Meds: sodium chloride, acetaminophen, nitroGLYCERIN, ondansetron (ZOFRAN) IV, sodium chloride flush   Vital Signs    Vitals:   03/09/18 1447 03/09/18 1523 03/09/18 1900 03/10/18 0400  BP: 115/67 121/60 (!) 159/55 (!) 126/56  Pulse: 77 76 82 67  Resp: 18 15 14    Temp:  98.4 F (36.9 C) 98.7 F (37.1 C) 97.8 F (36.6 C)  TempSrc:  Oral Oral Oral  SpO2: 100% 99% 99% 100%  Weight:      Height:        Intake/Output Summary (Last 24 hours) at 03/10/2018 0751 Last data filed at 03/10/2018 0400 Gross per 24 hour  Intake 840 ml  Output 1700 ml  Net -860 ml   Filed Weights   03/09/18 1132  Weight: 246 lb (111.6 kg)    Telemetry    SR - Personally Reviewed  ECG    Sr at rate of 66 bpm - Personally Reviewed  Physical Exam   GEN: No acute distress.   Neck: No JVD Cardiac: RRR, no murmurs, rubs, or gallops. R radial cath site stable.  Respiratory: Clear to auscultation bilaterally. GI: Soft, nontender, non-distended  MS: No edema; No deformity. Neuro:  Nonfocal  Psych: Normal affect   Labs    Chemistry Recent Labs  Lab 03/07/18 1045 03/09/18 1152 03/10/18 0348  NA 136 136 137  K 5.4* 4.6 4.0  CL 99 103 103  CO2 19* 22 25  GLUCOSE 133*  163* 107*  BUN 15 15 16   CREATININE 1.49* 1.19 1.19  CALCIUM 9.5 9.7 9.3  GFRNONAA 45* 57* 57*  GFRAA 52* >60 >60  ANIONGAP  --  11 9     Hematology Recent Labs  Lab 03/07/18 1045 03/10/18 0348  WBC 7.8 6.8  RBC 5.92* 5.50  HGB 13.4 12.1*  HCT 42.8 41.6  MCV 72* 75.6*  MCH 22.6* 22.0*  MCHC 31.3* 29.1*  RDW 17.8* 18.8*  PLT 187 182   Lipid Panel  No results found for: CHOL, TRIG, HDL, CHOLHDL, VLDL, LDLCALC, LDLDIRECT   Radiology    No results found.  Cardiac Studies   CORONARY STENT INTERVENTION  LEFT HEART CATH AND CORONARY ANGIOGRAPHY  Conclusion     Prox RCA lesion is 30% stenosed.  Acute Mrg lesion is 99% stenosed.  Mid RCA to Dist RCA lesion is 80% stenosed.  Dist RCA lesion is 99% stenosed.  Ost Ramus lesion is 50% stenosed.  Prox Cx-1 lesion is 50% stenosed.  Prox Cx-2 lesion is 50% stenosed.  Previously placed Mid Cx stent (unknown type) is widely patent.  Ost LAD  to Prox LAD lesion is 40% stenosed.  Ost 1st Diag to 1st Diag lesion is 90% stenosed.  Prox LAD to Mid LAD lesion is 80% stenosed.  Mid LAD to Dist LAD lesion is 20% stenosed.  A drug-eluting stent was successfully placed using a STENT SIERRA 3.00 X 15 MM.  Post intervention, there is a 0% residual stenosis.   1. Diffuse triple vessel CAD 2. The LAD is a large caliber vessel that courses to the apex. The proximal LAD has diffuse moderate disease but no focally obstructive lesions. The mid LAD has an eccentric, focal, hazy stenosis. The distal LAD has mild diffuse disease. The first diagonal branch is a small caliber vessel with diffuse severe disease, unchanged from prior cath and not large enough for PCI.  3. The Circumflex has moderate proximal and mid stenosis. The obtuse marginal stent is patent without restenosis. The moderate to large caliber intermediate branch has moderate proximal stenosis.  4. The RCA is small caliber, non-dominant vessel with diffuse severe stenosis  in the small PDA and in the RV marginal branch, unchanged from last cath and too small for PCI.  5. Successful PTCA/DES x 1 mid LAD.   Recommendations: The PDA and the Diagonal are too small for stenting. Both of these branches have chronic lesions and are unchanged since the last cath in 2017. Either branch could cause angina. The LAD was stented today in a hazy mid stenosis. Hopefully this will relieve some of his angina.   Recommend uninterrupted dual antiplatelet therapy with Aspirin 81mg  daily and Clopidogrel 75mg  daily for a minimum of 12 months (ACS - Class I recommendation).    Diagnostic Diagram       Post-Intervention Diagram         Patient Profile     77 y.o. male with coronary artery disease status post DES to the [place] in 2015, diabetes, hypertension, hyperlipidemia, vertigo, carotid artery disease status post left CEA in 2016 presented for cath.   Assessment & Plan    1. CAD - Cath as above. S/p Successful PTCA/DES x 1 mid LAD. The PDA and the Diagonal are too small for stenting. Both of these branches have chronic lesions and are unchanged since the last cath in 2017.  - Continue ASA, Plavix, statin, BB and Imdur.   2. HLD - He will bring lipid panel reading during next office visit. Continue Lipitor.   For questions or updates, please contact [place] Please consult [email or web address] for contact info under Cardiology/STEMI.      Jarrett Soho, PA  03/10/2018, 7:51 AM    Patient seen and examined. Agree with assessment and plan. Feels better, but had experience mild chest fullness with walking. S/P DES stent to mid LAD  But multiple additional sources of potential ischemia.  Will increase isosorbide to 60 mg. Continue BB.  Potentially can add amlodipine and/or ranexa as outpatient ir recurrent angina develops. Aggressive lipid managemnet with LDL < 70 and preferably in 50s or below. OK for dc today; F/U Dr. Marlou Porch.   Troy Sine, MD,  [facility] 03/10/2018 8:53 AM

## 2018-03-10 NOTE — Discharge Summary (Signed)
Discharge Summary    Patient ID: GRIFFON HERBERG,  MRN: [id number], DOB/AGE: 77-Jan-1942 77 y.o.  Admit date: 03/09/2018 Discharge date: 03/10/2018  Primary Care Provider: Burnard Bunting Primary Cardiologist: Candee Furbish, MD  Discharge Diagnoses    Active Problems:   Coronary artery disease involving native coronary artery of native heart with unstable angina pectoris ([place])   Unstable angina (HCC)   HTN   HLD   S/p L CEA   DM  Allergies Allergies  Allergen Reactions  . Gabapentin Other (See Comments)    Ataxia  . Morphine And Related Itching       . Naproxen Other (See Comments)    Neurological reaction. Tremors, hallucinates     Diagnostic Studies/Procedures    CORONARY STENT INTERVENTION  LEFT HEART CATH AND CORONARY ANGIOGRAPHY  Conclusion     Prox RCA lesion is 30% stenosed.  Acute Mrg lesion is 99% stenosed.  Mid RCA to Dist RCA lesion is 80% stenosed.  Dist RCA lesion is 99% stenosed.  Ost Ramus lesion is 50% stenosed.  Prox Cx-1 lesion is 50% stenosed.  Prox Cx-2 lesion is 50% stenosed.  Previously placed Mid Cx stent (unknown type) is widely patent.  Ost LAD to Prox LAD lesion is 40% stenosed.  Ost 1st Diag to 1st Diag lesion is 90% stenosed.  Prox LAD to Mid LAD lesion is 80% stenosed.  Mid LAD to Dist LAD lesion is 20% stenosed.  A drug-eluting stent was successfully placed using a STENT SIERRA 3.00 X 15 MM.  Post intervention, there is a 0% residual stenosis.  1. Diffuse triple vessel CAD 2. The LAD is a large caliber vessel that courses to the apex. The proximal LAD has diffuse moderate disease but no focally obstructive lesions. The mid LAD has an eccentric, focal, hazy stenosis. The distal LAD has mild diffuse disease. The first diagonal branch is a small caliber vessel with diffuse severe disease, unchanged from prior cath and not large enough for PCI.  3. The Circumflex has moderate proximal and mid stenosis. The obtuse  marginal stent is patent without restenosis. The moderate to large caliber intermediate branch has moderate proximal stenosis.  4. The RCA is small caliber, non-dominant vessel with diffuse severe stenosis in the small PDA and in the RV marginal branch, unchanged from last cath and too small for PCI.  5. Successful PTCA/DES x 1 mid LAD.   Recommendations: The PDA and the Diagonal are too small for stenting. Both of these branches have chronic lesions and are unchanged since the last cath in 2017. Either branch could cause angina. The LAD was stented today in a hazy mid stenosis. Hopefully this will relieve some of his angina.   Recommend uninterrupted dual antiplatelet therapy with Aspirin 81mg  daily and Clopidogrel 75mg  dailyfor a minimum of 12 months (ACS - Class I recommendation).    Diagnostic Diagram       Post-Intervention Diagram         History of Present Illness     77 y.o. male with coronary artery disease status post DES to the OM1 in 2015, diabetes, hypertension, hyperlipidemia, vertigo, carotid artery disease status post left CEA in 2016 presented for cath.   Hospital Course     Consultants: None  1. CAD - Cath as above. S/p Successful PTCA/DES x 1 mid LAD. The PDA and the Diagonal are too small for stenting. But multiple additional sources of potential ischemia. Mild chest discomfort with waking with cardiac rehab.  Will increase isosorbide to 60 mg (recently reduced). Continue BB.  Potentially can add amlodipine and/or ranexa (listed in home meds but not taking) as outpatient ir recurrent angina.  - Continue ASA, Plavix, statin, BB and Imdur.   2. HLD - He will bring lipid panel reading during next office visit. Continue Lipitor. Aggressive lipid managemnet with LDL < 70 and preferably in 50s or below.   Discharge Vitals Blood pressure (!) 155/61, pulse 67, temperature 97.9 F (36.6 C), resp. rate 16, height 5' 11.5" (1.816 m), weight 246 lb (111.6 kg), SpO2  100 %.  Filed Weights   03/09/18 1132  Weight: 246 lb (111.6 kg)    Labs & Radiologic Studies    CBC Recent Labs    03/07/18 1045 03/10/18 0348  WBC 7.8 6.8  HGB 13.4 12.1*  HCT 42.8 41.6  MCV 72* 75.6*  PLT 187 401   Basic Metabolic Panel Recent Labs    03/09/18 1152 03/10/18 0348  NA 136 137  K 4.6 4.0  CL 103 103  CO2 22 25  GLUCOSE 163* 107*  BUN 15 16  CREATININE 1.19 1.19  CALCIUM 9.7 9.3    Disposition   Pt is being discharged home today in good condition.  Follow-up Plans & Appointments    Follow-up Information    Jerline Pain, MD. Go on 03/24/2018.   Specialty:  Cardiology Why:  @8 :20am for hospital follow up Contact information: [address] [place] [address] [phone]          Discharge Instructions    Amb Referral to Cardiac Rehabilitation   Complete by:  As directed    Diagnosis:  Coronary Stents   Diet - low sodium heart healthy   Complete by:  As directed    Discharge instructions   Complete by:  As directed    No driving for 48 hours. No lifting over 5 lbs for 1 week. No sexual activity for 1 week.  Keep procedure site clean & dry. If you notice increased pain, swelling, bleeding or pus, call/return!  You may shower, but no soaking baths/hot tubs/pools for 1 week.   Hold metformin for 2 days. Resume on Sunday.   Increase activity slowly   Complete by:  As directed       Discharge Medications   Allergies as of 03/10/2018      Reactions   Gabapentin Other (See Comments)   Ataxia   Morphine And Related Itching      Naproxen Other (See Comments)   Neurological reaction. Tremors, hallucinates       Medication List    STOP taking these medications   ranolazine 500 MG 12 hr tablet Commonly known as:  RANEXA     TAKE these medications   acetaminophen 325 MG tablet Commonly known as:  TYLENOL Take 650 mg by mouth every 6 (six) hours as needed for moderate pain or headache.   alum & mag  hydroxide-simeth 200-200-20 MG/5ML suspension Commonly known as:  MAALOX/MYLANTA Take 15 mLs by mouth every 6 (six) hours as needed for indigestion or heartburn.   ARTIFICIAL TEARS OP Place 1 drop into both eyes daily as needed (dry eyes).   aspirin 81 MG tablet Take 1 tablet (81 mg total) by mouth daily.   atorvastatin 40 MG tablet Commonly known as:  LIPITOR TAKE 1 TABLET BY MOUTH ONCE DAILY BEFORE BREAKFAST What changed:  See the new instructions.   B-12 5000 MCG Caps Take 5,000 mcg  by mouth daily.   chlorhexidine 0.12 % solution Commonly known as:  PERIDEX SWISH WITH 1-2 OUNCE FOR 30 SECONDS THEN SPIT TWICE DAILY as needed for flare up   clopidogrel 75 MG tablet Commonly known as:  PLAVIX TAKE 1 TABLET BY MOUTH ONCE DAILY   colchicine 0.6 MG tablet Take 0.6 mg by mouth daily as needed (gout).   dorzolamide-timolol 22.3-6.8 MG/ML ophthalmic solution Commonly known as:  COSOPT Place 1 drop into both eyes 2 (two) times daily.   FIFTY50 PEN NEEDLES 31G X 8 MM Misc Generic drug:  Insulin Pen Needle   FISH OIL PO Take 520 mg by mouth daily.   guaiFENesin 600 MG 12 hr tablet Commonly known as:  MUCINEX Take 600 mg by mouth 2 (two) times daily as needed for cough.   HUMALOG KWIKPEN 200 UNIT/ML Sopn Generic drug:  Insulin Lispro Inject 10-22 Units as directed 3 (three) times daily. SLIDING SCALE   ibuprofen 200 MG tablet Commonly known as:  ADVIL,MOTRIN Take 400 mg by mouth every 6 (six) hours as needed for moderate pain.   insulin glargine 100 UNIT/ML injection Commonly known as:  LANTUS Inject 80 Units into the skin at bedtime.   isosorbide mononitrate 60 MG 24 hr tablet Commonly known as:  IMDUR Take 1 tablet (60 mg total) by mouth daily. What changed:  See the new instructions.   JARDIANCE 25 MG Tabs tablet Generic drug:  empagliflozin Take 25 mg by mouth daily.   lisinopril 5 MG tablet Commonly known as:  PRINIVIL,ZESTRIL Take 1 tablet (5 mg total) by  mouth daily. HOLD PER SCOTT WEAVER, [facility] 03/07/18   LYRICA 100 MG capsule Generic drug:  pregabalin Take 100 mg by mouth 2 (two) times daily as needed. For neuropathic pain   Melatonin 5 MG Tabs Take 5 mg by mouth at bedtime.   metFORMIN 1000 MG tablet Commonly known as:  GLUCOPHAGE TAKE ONE TABLET BY MOUTH TWICE DAILY WITH A MEAL What changed:    how much to take  how to take this  when to take this   metoprolol tartrate 50 MG tablet Commonly known as:  LOPRESSOR TAKE 1 TABLET BY MOUTH TWICE DAILY   nitroGLYCERIN 0.4 MG SL tablet Commonly known as:  NITROSTAT PLACE 1 TABLET UNDER THE TONGUE EVERY 5 MINUTES AS NEEDED FOR CHEST PAIN   nystatin 100000 UNIT/ML suspension Commonly known as:  MYCOSTATIN SWISH WITH 5 MLS IN MOUTH 4 TIMES DAILY FOR 2 MINUTES THEN SWALLOW   OVER THE COUNTER MEDICATION Apply 1 application topically daily as needed (nerve pain). Theraworx otc pain relief foam   pantoprazole 40 MG tablet Commonly known as:  PROTONIX TAKE 1 TABLET BY MOUTH TWICE DAILY   PROBIOTIC PO Take 1 tablet by mouth daily.   pyridoxine 100 MG tablet Commonly known as:  B-6 Take 100 mg by mouth daily.   tiZANidine 4 MG tablet Commonly known as:  ZANAFLEX Take 1 tablet by mouth at bedtime.   triamcinolone cream 0.1 % Commonly known as:  KENALOG Apply 1 application topically daily as needed (lichen planus). With cetaphil        Acute coronary syndrome (MI, NSTEMI, STEMI, etc) this admission?: No.    Outstanding Labs/Studies   None  Duration of Discharge Encounter   Greater than 30 minutes including physician time.  Signed, Leanor Kail, PA 03/10/2018, 9:15 AM

## 2018-03-10 NOTE — Progress Notes (Signed)
CARDIAC REHAB PHASE I   PRE:  Rate/Rhythm: 80 SR  BP:  Supine:   Sitting: 156/61  Standing:    SaO2:   MODE:  Ambulation: 480 ft   POST:  Rate/Rhythm: 102 ST  BP:  Supine:   Sitting: 196/74,  161/65  Standing:    SaO2: 99%RA 0805--0900 Pt walked 480 ft on RA with slow steady gait. Uses a cane at home if he walks long distances. Did have a little chest tightness after walk that was relieved with sitting on side of bed. Not like CP he was admitted with. BP elevated after walk but better next check which was in a couple of minutes. Stressed importance of plavix with stent. Reviewed NTG use, gave heart healthy and diabetic diets and discussed healthy food choices. Pt with recent back surgery. Encouraged walking as tolerated as directed by surgeon. Has been swimming for ex. Since wrist used for PCI, encouraged pt not to swim until returns for cardiology visit. Discussed CRP 2 and referred to [place].    Graylon Good, RN BSN  03/10/2018 8:54 AM

## 2018-03-14 ENCOUNTER — Telehealth (HOSPITAL_COMMUNITY): Payer: Self-pay

## 2018-03-14 ENCOUNTER — Telehealth: Payer: Self-pay | Admitting: Cardiology

## 2018-03-14 NOTE — Telephone Encounter (Signed)
Pt had cardiac cath last Thurasday.  Reviewed discharge instructions and advised OK to remove bandage if he hasn't already done so.  Advised to keep clean and dry and to c/b if any concerns with his cath site.  He sates understanding.

## 2018-03-14 NOTE — Telephone Encounter (Signed)
New message   Patient wants to know how long he needs to keep bandage on the site for a post Heart Cath. Please advise.

## 2018-03-14 NOTE — Telephone Encounter (Signed)
Made initial call to patient in regards to Cardiac Rehab. Patient stated he is dealing with Vertigo at this time. He does not want to commit right now and would like to talk with Dr.Skains. Explained to patient our scheduling process and he verbalized understanding. Will follow up with patient once follow up appt has been completed.

## 2018-03-14 NOTE — Telephone Encounter (Signed)
Patients insurance is active and benefits verified through [facility] - No co-pay, no deductible, out of pocket amount of $5,500/$777.09 has been met, 20% co-insurance, and no pre-authorization is required. Reference - Manfred Shirts 03/14/18.  Will contact patient to see if he is interested in the Cardiac Rehab program. If interested, patient will need to complete follow up appt. Further review to be done.

## 2018-03-24 ENCOUNTER — Ambulatory Visit: Payer: Medicare Other | Admitting: Cardiology

## 2018-03-24 ENCOUNTER — Encounter: Payer: Self-pay | Admitting: Cardiology

## 2018-03-24 ENCOUNTER — Telehealth (HOSPITAL_COMMUNITY): Payer: Self-pay

## 2018-03-24 VITALS — BP 124/68 | HR 73 | Ht 71.5 in | Wt 243.0 lb

## 2018-03-24 DIAGNOSIS — E785 Hyperlipidemia, unspecified: Secondary | ICD-10-CM | POA: Diagnosis not present

## 2018-03-24 DIAGNOSIS — I209 Angina pectoris, unspecified: Secondary | ICD-10-CM | POA: Diagnosis not present

## 2018-03-24 DIAGNOSIS — I779 Disorder of arteries and arterioles, unspecified: Secondary | ICD-10-CM | POA: Diagnosis not present

## 2018-03-24 DIAGNOSIS — I739 Peripheral vascular disease, unspecified: Secondary | ICD-10-CM

## 2018-03-24 DIAGNOSIS — I2510 Atherosclerotic heart disease of native coronary artery without angina pectoris: Secondary | ICD-10-CM

## 2018-03-24 DIAGNOSIS — I2583 Coronary atherosclerosis due to lipid rich plaque: Secondary | ICD-10-CM

## 2018-03-24 MED ORDER — LISINOPRIL 5 MG PO TABS: 5.0000 mg | ORAL_TABLET | Freq: Every day | ORAL | 3 refills | Status: DC

## 2018-03-24 NOTE — Patient Instructions (Signed)
Medication Instructions:  The current medical regimen is effective;  continue present plan and medications.  Follow-Up: Follow up in 6 months with Richardson Dopp, PA.  You will receive a letter in the mail 2 months before you are due.  Please call us when you receive this letter to schedule your follow up appointment.  Follow up in 1 year with Dr. Marlou Porch.  You will receive a letter in the mail 2 months before you are due.  Please call us when you receive this letter to schedule your follow up appointment.  If you need a refill on your cardiac medications before your next appointment, please call your pharmacy.  Thank you for choosing [facility]!!

## 2018-03-24 NOTE — Telephone Encounter (Signed)
Called to follow up with patient in regards to cardiac rehab - patient stated he is not interested to participate at this time. Closed referral.

## 2018-03-24 NOTE — Progress Notes (Signed)
Cardiology Office Note    Date:  03/24/2018   ID:  RAIFE Young, DOB [birth year], MRN [id number]  PCP:  Burnard Bunting, MD  Cardiologist:   Candee Furbish, MD     History of Present Illness:  Seth Young is a 77 y.o. male former patient of Dr. Sherryl Young with diabetes, hypertension, hyperlipidemia, coronary artery disease here for follow-up  In 2015 he underwent heart catheterization after an abnormal nuclear stress test. Fairly diffuse disease. Normal ejection fraction 55%. Culprit vessel was felt to be the obtuse marginal 1. This was treated with PCI and DES. See some catheterization report summarized as below  Vertigo all summer 2017 - no CVA. Still with it. Cut back on exercise. Swim a little.   Felt a pressure in chest similar to GERD prior to stent. Dr. Reynaldo Young ECG.  Watch diet. Pain certainly has GERD-like suggestion such as when laying down feels worse. He however is quite concerned especially with the increased shortness of breath with exercise. He is worried that his chest pressure may be worsening coronary disease.  Cardiac catheterization from 20152 reviewed. OM stent patent. PDA did not appear graftable. Fairly diffuse moderate CAD.   Swimming, exercise bicycle. Vertigo.   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   Surprised by CAD. Does not have much confidence in nuclear stress test. We discussed.   06/09/17-we have been increasing his antianginals, isosorbide to 90, metoprolol 50 twice a day.  Once again last catheterization 2015, stent to first obtuse marginal.  Diffuse nonobstructive LAD and circumflex disease.  Severe RCA stenosis involving distal branches. Multitude of complaints including neuropathy, vertigo, anxiety.  We discussed at length.  12/12/17 - rare GERD At night . If stop eating at 7 improved GERD symptoms.  Overall he has been rehabbing from lumbar spine surgery back on 11/02/2017.  Been doing fairly well.  No  heart symptoms to speak of.  No chest pain, no shortness of breath, no syncope.  Blood pressure slightly on the low side today but he is asymptomatic.  03/24/18 - Veritgo 2 days after stent placed. Has had for 2 years at times. Lipoflavanoid helps. Feels better. Pressure in chest has gotten worse. Wonders if it is gas. Gas-x has helped. Had constant pressure prior. As soon as lays down pressure. Reduced lisinopril to 5.  Encouraged him to stay active.  He asked about a chair lift up his stairs and I encouraged him to try to avoid this as long as possible unless it is absolutely necessary.  No bleeding issues.  Past Medical History:  Diagnosis Date  . Arthritis    "knees, toes, hands, probably in my back" (03/09/2018)  . Ataxia   . Basal cell carcinoma    left temporal area-no residual  . Colon polyps 10-12-11   past hx.  . Coronary artery disease   . Exertional angina ([place]) 06/26/2014   Cath with DES to the OM, Bioflow protocol   . GERD (gastroesophageal reflux disease)   . H/O hiatal hernia    no problems  . High cholesterol   . History of echocardiogram    Echo 7/19:  Mild LVH, mild focal basal septal hypertrophy, EF 60-65, no RWMA, Gr 1 DD, trivial AI, MAC  . Hypertension   . Ischemic chest pain 08/07/2014  . Occasional tremors    Bilateral hands  . Peripheral neuropathy   . Pneumonia 1940's X 2; ~ 2017   "infant; walking pneumonia" (  03/09/2018)  . Pseudogout    "it moves around"  . Sleep apnea    no cpap used  . Type II diabetes mellitus ([place]) dx'd 1982   nsulin started ~ 2000  . Vertigo     Past Surgical History:  Procedure Laterality Date  . BACK SURGERY    . BASAL CELL CARCINOMA EXCISION Left ~ 2012   face  . CARDIAC CATHETERIZATION  02/2005   Dr. Mare Ferrari; negative.  Seth Young CARDIAC CATHETERIZATION N/A 05/13/2016   Procedure: Left Heart Cath and Coronary Angiography;  Surgeon: Jerline Pain, MD;  Location: [place] [facility];  Service: Cardiovascular;  Laterality: N/A;  .  CARPAL TUNNEL RELEASE Left 10/2009  . CARPAL TUNNEL RELEASE Right ~ 2013  . CATARACT EXTRACTION W/ INTRAOCULAR LENS IMPLANT Left 2000's  . CATARACT EXTRACTION W/ INTRAOCULAR LENS IMPLANT Right 2015  . COLONOSCOPY W/ POLYPECTOMY    . CORONARY ANGIOPLASTY WITH STENT PLACEMENT  06/26/2014   "1"  . CORONARY ANGIOPLASTY WITH STENT PLACEMENT  06/26/2014   pLAD 50%, d LAD 60%, oD1 80%,  mD1  70%, CFX 50%, OM 2 70%,  RCA 80%, PDA 95% (<33m), OM1 99%-0% with Bio flow stent       . CORONARY ANGIOPLASTY WITH STENT PLACEMENT  03/09/2018  . CORONARY STENT INTERVENTION N/A 03/09/2018   Procedure: CORONARY STENT INTERVENTION;  Surgeon: MBurnell Blanks MD;  [facility];  Service: Cardiovascular;  Laterality: N/A;  . ELBOW SURGERY Bilateral ~ 2014   "for blockages; Dr. GAmedeo Plenty  . ENDARTERECTOMY Left 04/23/2015   Procedure: ENDARTERECTOMY CAROTID;  Surgeon: BConrad Mentor MD;  [place] [facility]  Service: Vascular;  Laterality: Left;  . EYE SURGERY Left    "related go diabetes; laser"  . KNEE ARTHROSCOPY Left 05/2011  . LEFT HEART CATH AND CORONARY ANGIOGRAPHY N/A 03/09/2018   Procedure: LEFT HEART CATH AND CORONARY ANGIOGRAPHY;  Surgeon: MBurnell Blanks MD;  [facility];  Service: Cardiovascular;  Laterality: N/A;  . LEFT HEART CATHETERIZATION WITH CORONARY ANGIOGRAM N/A 06/26/2014   Procedure: LEFT HEART CATHETERIZATION WITH CORONARY ANGIOGRAM;  Surgeon: MBlane Ohara MD;  [facility];  Service: Cardiovascular;  Laterality: N/A;  . LEFT HEART CATHETERIZATION WITH CORONARY ANGIOGRAM N/A 08/07/2014   Procedure: LEFT HEART CATHETERIZATION WITH CORONARY ANGIOGRAM;  Surgeon: MBlane Ohara MD;  [facility];  Service: Cardiovascular;  Laterality: N/A;  . LUMBAR LAMINECTOMY/DECOMPRESSION MICRODISCECTOMY Left 11/02/2017   Procedure: MICRODISCECTOMY LUMBAR THREE- LUMBAR FOUR, LEFT;  Surgeon: CAshok Pall MD;  [facility] [facility]  Service:  Neurosurgery;  Laterality: Left;  . NASAL SEPTUM SURGERY  1979  . PATCH ANGIOPLASTY Left 04/23/2015   Procedure: PATCH ANGIOPLASTY USING 1CM X 6CM XENOSURE BIOLOGIC PATCH;  Surgeon: BConrad Ceylon MD;  [place] [facility]  Service: Vascular;  Laterality: Left;  . PSan Ygnacio . SHOULDER OPEN ROTATOR CUFF REPAIR  10/14/2011   Procedure: ROTATOR CUFF REPAIR SHOULDER OPEN;  Surgeon: JJohnn Hai MD;  [facility];  Service: Orthopedics;  Laterality: Right;  . SUBACROMIAL DECOMPRESSION  10/14/2011   Procedure: SUBACROMIAL DECOMPRESSION;  Surgeon: JJohnn Hai MD;  [facility];  Service: Orthopedics;  Laterality: Right;  . TRIGGER FINGER RELEASE Right ~ 2013-2014   "3rd & 4th digits"    Current Medications: Outpatient Medications Prior to Visit  Medication Sig Dispense Refill  . acetaminophen (TYLENOL) 325 MG tablet Take 650 mg by mouth every 6 (six)  hours as needed for moderate pain or headache.     Seth Young alum & mag hydroxide-simeth (MAALOX/MYLANTA) 200-200-20 MG/5ML suspension Take 15 mLs by mouth every 6 (six) hours as needed for indigestion or heartburn.    Seth Young aspirin 81 MG tablet Take 1 tablet (81 mg total) by mouth daily.    Seth Young atorvastatin (LIPITOR) 40 MG tablet TAKE 1 TABLET BY MOUTH ONCE DAILY BEFORE BREAKFAST (Patient taking differently: TAKE 20 mg BY MOUTH ONCE DAILY BEFORE BREAKFAST) 90 tablet 3  . chlorhexidine (PERIDEX) 0.12 % solution SWISH WITH 1-2 OUNCE FOR 30 SECONDS THEN SPIT TWICE DAILY as needed for flare up  0  . clopidogrel (PLAVIX) 75 MG tablet TAKE 1 TABLET BY MOUTH ONCE DAILY 90 tablet 3  . colchicine 0.6 MG tablet Take 0.6 mg by mouth daily as needed (gout).     . Cyanocobalamin (B-12) 5000 MCG CAPS Take 5,000 mcg by mouth daily.    . dorzolamide-timolol (COSOPT) 22.3-6.8 MG/ML ophthalmic solution Place 1 drop into both eyes 2 (two) times daily.    . empagliflozin (JARDIANCE) 25 MG TABS tablet Take 25 mg by mouth daily.    Seth Young guaiFENesin (MUCINEX)  600 MG 12 hr tablet Take 600 mg by mouth 2 (two) times daily as needed for cough.    Seth Young HUMALOG KWIKPEN 200 UNIT/ML SOPN Inject 10-22 Units as directed 3 (three) times daily. SLIDING SCALE  1  . Hypromellose (ARTIFICIAL TEARS OP) Place 1 drop into both eyes daily as needed (dry eyes).    Seth Young ibuprofen (ADVIL,MOTRIN) 200 MG tablet Take 400 mg by mouth every 6 (six) hours as needed for moderate pain.    Seth Young insulin glargine (LANTUS) 100 UNIT/ML injection Inject 80 Units into the skin at bedtime.     . Insulin Pen Needle (FIFTY50 PEN NEEDLES) 31G X 8 MM MISC     . isosorbide mononitrate (IMDUR) 60 MG 24 hr tablet Take 1 tablet (60 mg total) by mouth daily. 90 tablet 3  . LYRICA 100 MG capsule Take 100 mg by mouth 2 (two) times daily as needed. For neuropathic pain  2  . Melatonin 5 MG TABS Take 5 mg by mouth at bedtime.    . metFORMIN (GLUCOPHAGE) 1000 MG tablet TAKE ONE TABLET BY MOUTH TWICE DAILY WITH A MEAL (Patient taking differently: TAKE ONE TABLET (1063m) BY MOUTH TWICE DAILY WITH A MEAL) 60 tablet 0  . metoprolol tartrate (LOPRESSOR) 50 MG tablet TAKE 1 TABLET BY MOUTH TWICE DAILY 180 tablet 2  . nitroGLYCERIN (NITROSTAT) 0.4 MG SL tablet PLACE 1 TABLET UNDER THE TONGUE EVERY 5 MINUTES AS NEEDED FOR CHEST PAIN 25 tablet 3  . nystatin (MYCOSTATIN) 100000 UNIT/ML suspension SWISH WITH 5 MLS IN MOUTH 4 TIMES DAILY FOR 2 MINUTES THEN SWALLOW  3  . Omega-3 Fatty Acids (FISH OIL PO) Take 520 mg by mouth daily.    .Seth KitchenOVER THE COUNTER MEDICATION Apply 1 application topically daily as needed (nerve pain). Theraworx otc pain relief foam     . pantoprazole (PROTONIX) 40 MG tablet TAKE 1 TABLET BY MOUTH TWICE DAILY 180 tablet 1  . Probiotic Product (PROBIOTIC PO) Take 1 tablet by mouth daily.    .Seth Kitchenpyridoxine (B-6) 100 MG tablet Take 100 mg by mouth daily.    .Seth KitchentiZANidine (ZANAFLEX) 4 MG tablet Take 1 tablet by mouth at bedtime.   1  . triamcinolone cream (KENALOG) 0.1 % Apply 1 application topically daily as  needed (lichen planus). With cetaphil    .  lisinopril (PRINIVIL,ZESTRIL) 5 MG tablet Take 1 tablet (5 mg total) by mouth daily. HOLD PER SCOTT WEAVER, [facility] 03/07/18 90 tablet 3   No facility-administered medications prior to visit.      Allergies:   Gabapentin; Morphine and related; and Naproxen   Social History   Socioeconomic History  . Marital status: Married    Spouse name: Not on file  . Number of children: 5  . Years of education: College  . Highest education level: Not on file  Occupational History  . Not on file  Social Needs  . Financial resource strain: Not on file  . Food insecurity:    Worry: Not on file    Inability: Not on file  . Transportation needs:    Medical: Not on file    Non-medical: Not on file  Tobacco Use  . Smoking status: Former Smoker    Packs/day: 0.50    Years: 20.00    Pack years: 10.00    Types: Cigarettes    Last attempt to quit: 10/11/1988    Years since quitting: 29.4  . Smokeless tobacco: Never Used  Substance and Sexual Activity  . Alcohol use: No    Alcohol/week: 0.0 standard drinks    Comment: Quit drinking in 1990  . Drug use: No  . Sexual activity: Not Currently  Lifestyle  . Physical activity:    Days per week: Not on file    Minutes per session: Not on file  . Stress: Not on file  Relationships  . Social connections:    Talks on phone: Not on file    Gets together: Not on file    Attends religious service: Not on file    Active member of club or organization: Not on file    Attends meetings of clubs or organizations: Not on file    Relationship status: Not on file  Other Topics Concern  . Not on file  Social History Narrative   Drinks about 2 cups of coffee a day, occasional diet pepsi.     Quit smoking approximately 27 years ago.  Family History:  The patient's family history includes Cancer in his sister; Diabetes in his brother, mother, and sister; Heart attack (age of onset: 38) in his father; Hypertension in his  brother and sister; Varicose Veins in his mother and sister.   ROS:   Please see the history of present illness.    Review of Systems  All other systems reviewed and are negative.     PHYSICAL EXAM:   VS:  BP 124/68   Pulse 73   Ht 5' 11.5" (1.816 m)   Wt 243 lb (110.2 kg)   BMI 33.42 kg/m    GEN: Well nourished, well developed, in no acute distress, Cane HEENT: normal  Neck: no JVD, carotid bruits, or masses Cardiac: RRR; no murmurs, rubs, or gallops,no edema  Respiratory:  clear to auscultation bilaterally, normal work of breathing GI: soft, nontender, nondistended, + BS MS: no deformity or atrophy  Skin: warm and dry, no rash Neuro:  Alert and Oriented x 3, Strength and sensation are intact Psych: euthymic mood, full affect     Wt Readings from Last 3 Encounters:  03/24/18 243 lb (110.2 kg)  03/09/18 246 lb (111.6 kg)  03/07/18 243 lb 1.9 oz (110.3 kg)      Studies/Labs Reviewed:   EKG:  None today  Recent Labs: 03/10/2018: BUN 16; Creatinine, Ser 1.19; Hemoglobin 12.1; Platelets 182; Potassium 4.0; Sodium 137  Lipid Panel No results found for: CHOL, TRIG, HDL, CHOLHDL, VLDL, LDLCALC, LDLDIRECT  Additional studies/ records that were reviewed today include:  ECHO  (07/21/15) - Left ventricle: The cavity size was normal. There was severe focal basal and mild concentric hypertrophy of the left ventricle. Systolic function was vigorous. The estimated ejection fraction was in the range of 65% to 70%. Wall motion was normal; there were no regional wall motion abnormalities. Doppler parameters are consistent with abnormal left ventricular relaxation (grade 1 diastolic dysfunction). Doppler parameters are consistent with elevated ventricular end-diastolic filling pressure.  CORONARY STENT INTERVENTION  LEFT HEART CATH AND CORONARY ANGIOGRAPHY  Conclusion     Prox RCA lesion is 30% stenosed.  Acute Mrg lesion is 99% stenosed.  Mid RCA to  Dist RCA lesion is 80% stenosed.  Dist RCA lesion is 99% stenosed.  Ost Ramus lesion is 50% stenosed.  Prox Cx-1 lesion is 50% stenosed.  Prox Cx-2 lesion is 50% stenosed.  Previously placed Mid Cx stent (unknown type) is widely patent.  Ost LAD to Prox LAD lesion is 40% stenosed.  Ost 1st Diag to 1st Diag lesion is 90% stenosed.  Prox LAD to Mid LAD lesion is 80% stenosed.  Mid LAD to Dist LAD lesion is 20% stenosed.  A drug-eluting stent was successfully placed using a STENT SIERRA 3.00 X 15 MM.  Post intervention, there is a 0% residual stenosis.  1. Diffuse triple vessel CAD 2. The LAD is a large caliber vessel that courses to the apex. The proximal LAD has diffuse moderate disease but no focally obstructive lesions. The mid LAD has an eccentric, focal, hazy stenosis. The distal LAD has mild diffuse disease. The first diagonal branch is a small caliber vessel with diffuse severe disease, unchanged from prior cath and not large enough for PCI.  3. The Circumflex has moderate proximal and mid stenosis. The obtuse marginal stent is patent without restenosis. The moderate to large caliber intermediate branch has moderate proximal stenosis.  4. The RCA is small caliber, non-dominant vessel with diffuse severe stenosis in the small PDA and in the RV marginal branch, unchanged from last cath and too small for PCI.  5. Successful PTCA/DES x 1 mid LAD.   Recommendations: The PDA and the Diagonal are too small for stenting. Both of these branches have chronic lesions and are unchanged since the last cath in 2017. Either branch could cause angina. The LAD was stented today in a hazy mid stenosis. Hopefully this will relieve some of his angina.   Recommend uninterrupted dual antiplatelet therapy with Aspirin 70m daily and Clopidogrel 79mdailyfor a minimum of 12 months (ACS - Class I recommendation).    Diagnostic Diagram       Post-Intervention Diagram           ASSESSMENT:    1. Angina pectoris ([facility]  2. Bilateral carotid artery disease, unspecified type ([facility]  3. Coronary artery disease due to lipid rich plaque   4. Hyperlipidemia, unspecified hyperlipidemia type      PLAN:  In order of problems listed above:  Coronary artery disease/Angina  - Stent to mid LAD 03/09/18, diag too small, additional sources for angina noted. Imdur. Could add amlodipine or Ranexa in future. Did not want to participate in rehab. DAPT. Prior to this had cath 05/13/16  - Isosorbide 60, metoprolol 50 twice a day.  Continue to encourage weight loss, optimization of diet.  Continue to encourage exercise, movement.  He did feel like  some of his chest pressure might be gas/GERD related.  This seems to have improved after taking Gas-X.  I asked him to discuss further with Dr. Reynaldo Young.  Essential hypertension  - Isosorbide, lisinopril, metoprolol.  Previously, his lisinopril 5 mg was on hold.  He is now taking this once again.  Blood pressure doing okay.  Asymptomatic.  Carotid artery disease  - Endarterectomy 04/23/15, left with no TIA symptoms. Dr. Bridgett Larsson. Aggressive secondary prevention, continue.  No changes made.  Hyperlipidemia  - Lipitor. PCP has been following this.  No changes.  LDL 65.  No myalgias.  Diabetes with insulin use  - Hemoglobin once he 7.0. Medicines reviewed. Jardiance, doing well.  Lisinopril helpful for renal protection.  Obesity  - Continue to encourage weight loss. This will also help his diabetes.  Understands.  Vertigo  -Has been battling this for the past couple years.  Had an episode post catheterization, just now getting better.  Has seen several doctors for this.    Medication Adjustments/Labs and Tests Ordered: Current medicines are reviewed at length with the patient today.  Concerns regarding medicines are outlined above.  Medication changes, Labs and Tests ordered today are listed in the Patient Instructions below.  72-month follow-up with LMickel Baas 1 year with me  Patient Instructions  Medication Instructions:  The current medical regimen is effective;  continue present plan and medications.  Follow-Up: Follow up in 6 months with SRichardson Dopp PA.  You will receive a letter in the mail 2 months before you are due.  Please call uKoreawhen you receive this letter to schedule your follow up appointment.  Follow up in 1 year with Dr. SMarlou Porch  You will receive a letter in the mail 2 months before you are due.  Please call uKoreawhen you receive this letter to schedule your follow up appointment.  If you need a refill on your cardiac medications before your next appointment, please call your pharmacy.  [facility]        Signed, MCandee Furbish MD  03/24/2018 8:43 AM    [facility] [address] [place]  [id number]: [phone] Fax: [phone]

## 2018-04-14 ENCOUNTER — Other Ambulatory Visit: Payer: Self-pay | Admitting: Cardiology

## 2018-04-20 ENCOUNTER — Telehealth: Payer: Self-pay | Admitting: Cardiology

## 2018-04-20 NOTE — Telephone Encounter (Signed)
Increase metoprolol tartrate to 100mg  PO BID and have follow up with Cecille Rubin or Mickel Baas next available appt.  Candee Furbish, MD

## 2018-04-20 NOTE — Telephone Encounter (Signed)
° °  Pt c/o of Chest Pain: STAT if CP now or developed within 24 hours  1. Are you having CP right now? No  2. Are you experiencing any other symptoms (ex. SOB, nausea, vomiting, sweating)? Chest discomfort, nausea  3. How long have you been experiencing CP? weeks  4. Is your CP continuous or coming and going? Coming and going, more when laying down, walking   5. Have you taken Nitroglycerin? YES ?

## 2018-04-20 NOTE — Telephone Encounter (Signed)
Spoke with patient who is reporting he is having continuing chest pain/pressure as described in his recent office visit with Dr Marlou Porch.  Gas-X does help relieve it but so does (1) SL Ntg.  He has noted he feels it at night when he lays down.  He has decreased the amount of "ice chewing" he does before bed and it does seem to help.  A few days ago however he was walking up stairs and felt the pressure and it caused him to become sick to his stomach.  He reports some SOB when he has the pressure.  He reports when it occurs he stops what he is doing at takes some deep breaths which helps as well.  Advised since no active chest pain or pressure at this time he should f/u with Dr [name] as suggested by Dr Marlou Porch for further evaluation.  Also advised I will forward this information to Dr Marlou Porch for his review and any further orders.  Pt states "I just want to make sure I'm not going to have a heart attack or anything".  Reassurance given along with instruction to continue to use SL Ntg/call 911 should pain reoccur and not subside.  He was grateful for the call and will await further instructions.

## 2018-04-20 NOTE — Telephone Encounter (Signed)
Left message for pt to call back to discuss his chest discomfort/nausea.  When he was recently seen by Dr. Marlou Porch and evaluated for this patient felt like his discomfort may be r/t gas/indegestion and that Gas-X had afforded him some relief.  Dr. Marlou Porch had asked pt to f/u with Dr [name] for further evaluation of this.

## 2018-04-20 NOTE — Telephone Encounter (Signed)
Follow Up:      Returning your call from today. 

## 2018-04-21 MED ORDER — METOPROLOL TARTRATE 100 MG PO TABS: 100.0000 mg | ORAL_TABLET | Freq: Two times a day (BID) | ORAL | 3 refills | Status: DC

## 2018-04-21 NOTE — Telephone Encounter (Signed)
Pt aware to increase Metoprolol to 100 mg BID.  Requests new rx be sent into [facility] [id number].  Scheduled f/u appt 9/19.  Pt was grateful for the c/b, instructions and appt.

## 2018-04-24 DIAGNOSIS — E118 Type 2 diabetes mellitus with unspecified complications: Secondary | ICD-10-CM | POA: Diagnosis not present

## 2018-04-24 DIAGNOSIS — E1129 Type 2 diabetes mellitus with other diabetic kidney complication: Secondary | ICD-10-CM | POA: Diagnosis not present

## 2018-04-24 DIAGNOSIS — Z794 Long term (current) use of insulin: Secondary | ICD-10-CM | POA: Diagnosis not present

## 2018-04-24 DIAGNOSIS — E1139 Type 2 diabetes mellitus with other diabetic ophthalmic complication: Secondary | ICD-10-CM | POA: Diagnosis not present

## 2018-04-25 ENCOUNTER — Other Ambulatory Visit: Payer: Self-pay | Admitting: Internal Medicine

## 2018-04-25 DIAGNOSIS — R079 Chest pain, unspecified: Secondary | ICD-10-CM

## 2018-04-25 DIAGNOSIS — R110 Nausea: Secondary | ICD-10-CM

## 2018-04-27 ENCOUNTER — Encounter: Payer: Self-pay | Admitting: Cardiology

## 2018-04-27 ENCOUNTER — Ambulatory Visit: Payer: Medicare Other | Admitting: Cardiology

## 2018-04-27 VITALS — BP 108/62 | HR 74 | Ht 71.5 in | Wt 247.1 lb

## 2018-04-27 DIAGNOSIS — I10 Essential (primary) hypertension: Secondary | ICD-10-CM | POA: Diagnosis not present

## 2018-04-27 DIAGNOSIS — I2583 Coronary atherosclerosis due to lipid rich plaque: Secondary | ICD-10-CM

## 2018-04-27 DIAGNOSIS — I779 Disorder of arteries and arterioles, unspecified: Secondary | ICD-10-CM

## 2018-04-27 DIAGNOSIS — I739 Peripheral vascular disease, unspecified: Secondary | ICD-10-CM

## 2018-04-27 DIAGNOSIS — I209 Angina pectoris, unspecified: Secondary | ICD-10-CM

## 2018-04-27 DIAGNOSIS — I2510 Atherosclerotic heart disease of native coronary artery without angina pectoris: Secondary | ICD-10-CM

## 2018-04-27 NOTE — Patient Instructions (Signed)
Medication Instructions:  The current medical regimen is effective;  continue present plan and medications.  Follow-Up: Follow up in 6 months with Laura Ingold, NP.  You will receive a letter in the mail 2 months before you are due.  Please call us when you receive this letter to schedule your follow up appointment.  Follow up in 1 year with Dr. Skains.  You will receive a letter in the mail 2 months before you are due.  Please call us when you receive this letter to schedule your follow up appointment.  If you need a refill on your cardiac medications before your next appointment, please call your pharmacy.  Thank you for choosing [facility]!!     

## 2018-04-27 NOTE — Progress Notes (Signed)
Cardiology Office Note    Date:  04/27/2018   ID:  Seth WITHEM, DOB [birth year], MRN [id number]  PCP:  Burnard Bunting, MD  Cardiologist:   Candee Furbish, MD     History of Present Illness:  Seth Young is a 77 y.o. male former patient of Dr. Sherryl Barters with diabetes, hypertension, hyperlipidemia, coronary artery disease here for follow-up  In 2015 he underwent heart catheterization after an abnormal nuclear stress test. Fairly diffuse disease. Normal ejection fraction 55%. Culprit vessel was felt to be the obtuse marginal 1. This was treated with PCI and DES. See some catheterization report summarized as below  Vertigo all summer 2017 - no CVA. Still with it. Cut back on exercise. Swim a little.   Felt a pressure in chest similar to GERD prior to stent. Dr. [name] ECG.  Watch diet. Pain certainly has GERD-like suggestion such as when laying down feels worse. He however is quite concerned especially with the increased shortness of breath with exercise. He is worried that his chest pressure may be worsening coronary disease.  Cardiac catheterization from 20152 reviewed. OM stent patent. PDA did not appear graftable. Fairly diffuse moderate CAD.   Swimming, exercise bicycle. Vertigo.   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   Surprised by CAD. Does not have much confidence in nuclear stress test. We discussed.   06/09/17-we have been increasing his antianginals, isosorbide to 90, metoprolol 50 twice a day.  Once again last catheterization 2015, stent to first obtuse marginal.  Diffuse nonobstructive LAD and circumflex disease.  Severe RCA stenosis involving distal branches. Multitude of complaints including neuropathy, vertigo, anxiety.  We discussed at length.  12/12/17 - rare GERD At night . If stop eating at 7 improved GERD symptoms.  Overall he has been rehabbing from lumbar spine surgery back on 11/02/2017.  Been doing fairly well.  No  heart symptoms to speak of.  No chest pain, no shortness of breath, no syncope.  Blood pressure slightly on the low side today but he is asymptomatic.  03/24/18 - Veritgo 2 days after stent placed. Has had for 2 years at times. Lipoflavanoid helps. Feels better. Pressure in chest has gotten worse. Wonders if it is gas. Gas-x has helped. Had constant pressure prior. As soon as lays down pressure. Reduced lisinopril to 5.  Encouraged him to stay active.  He asked about a chair lift up his stairs and I encouraged him to try to avoid this as long as possible unless it is absolutely necessary.  No bleeding issues.  04/27/18 -chief complaint-evaluation of angina after increasing metoprolol from 50-100 twice a day.  States that he almost immediately felt better.  Going to have gall bladder evaluated by Dr. [name], ice sometimes exacerbates chest discomfort, question esophageal spasm.  Noted some discomfort when walking from couch to bedroom but this has improved since taking the increased metoprolol.  He is very pleased.  Denies any significant shortness of breath.  No fevers chills nausea vomiting.    Past Medical History:  Diagnosis Date  . Arthritis    "knees, toes, hands, probably in my back" (03/09/2018)  . Ataxia   . Basal cell carcinoma    left temporal area-no residual  . Colon polyps 10-12-11   past hx.  . Coronary artery disease   . Exertional angina ([place]) 06/26/2014   Cath with DES to the OM, Bioflow protocol   . GERD (gastroesophageal reflux disease)   .  H/O hiatal hernia    no problems  . High cholesterol   . History of echocardiogram    Echo 7/19:  Mild LVH, mild focal basal septal hypertrophy, EF 60-65, no RWMA, Gr 1 DD, trivial AI, MAC  . Hypertension   . Ischemic chest pain 08/07/2014  . Occasional tremors    Bilateral hands  . Peripheral neuropathy   . Pneumonia 1940's X 2; ~ 2017   "infant; walking pneumonia" (03/09/2018)  . Pseudogout    "it moves around"  . Sleep apnea     no cpap used  . Type II diabetes mellitus ([place]) dx'd 1982   nsulin started ~ 2000  . Vertigo     Past Surgical History:  Procedure Laterality Date  . BACK SURGERY    . BASAL CELL CARCINOMA EXCISION Left ~ 2012   face  . CARDIAC CATHETERIZATION  02/2005   Dr. Mare Ferrari; negative.  Marland Kitchen CARDIAC CATHETERIZATION N/A 05/13/2016   Procedure: Left Heart Cath and Coronary Angiography;  Surgeon: Jerline Pain, MD;  Location: [place] [facility];  Service: Cardiovascular;  Laterality: N/A;  . CARPAL TUNNEL RELEASE Left 10/2009  . CARPAL TUNNEL RELEASE Right ~ 2013  . CATARACT EXTRACTION W/ INTRAOCULAR LENS IMPLANT Left 2000's  . CATARACT EXTRACTION W/ INTRAOCULAR LENS IMPLANT Right 2015  . COLONOSCOPY W/ POLYPECTOMY    . CORONARY ANGIOPLASTY WITH STENT PLACEMENT  06/26/2014   "1"  . CORONARY ANGIOPLASTY WITH STENT PLACEMENT  06/26/2014   pLAD 50%, d LAD 60%, oD1 80%,  mD1  70%, CFX 50%, OM 2 70%,  RCA 80%, PDA 95% (<7m), OM1 99%-0% with Bio flow stent       . CORONARY ANGIOPLASTY WITH STENT PLACEMENT  03/09/2018  . CORONARY STENT INTERVENTION N/A 03/09/2018   Procedure: CORONARY STENT INTERVENTION;  Surgeon: MBurnell Blanks MD;  [facility];  Service: Cardiovascular;  Laterality: N/A;  . ELBOW SURGERY Bilateral ~ 2014   "for blockages; Dr. GAmedeo Plenty  . ENDARTERECTOMY Left 04/23/2015   Procedure: ENDARTERECTOMY CAROTID;  Surgeon: BConrad Staples MD;  [facility] [facility]  Service: Vascular;  Laterality: Left;  . EYE SURGERY Left    "related go diabetes; laser"  . KNEE ARTHROSCOPY Left 05/2011  . LEFT HEART CATH AND CORONARY ANGIOGRAPHY N/A 03/09/2018   Procedure: LEFT HEART CATH AND CORONARY ANGIOGRAPHY;  Surgeon: MBurnell Blanks MD;  [facility];  Service: Cardiovascular;  Laterality: N/A;  . LEFT HEART CATHETERIZATION WITH CORONARY ANGIOGRAM N/A 06/26/2014   Procedure: LEFT HEART CATHETERIZATION WITH CORONARY ANGIOGRAM;  Surgeon: MBlane Ohara MD;   [facility];  Service: Cardiovascular;  Laterality: N/A;  . LEFT HEART CATHETERIZATION WITH CORONARY ANGIOGRAM N/A 08/07/2014   Procedure: LEFT HEART CATHETERIZATION WITH CORONARY ANGIOGRAM;  Surgeon: MBlane Ohara MD;  [facility];  Service: Cardiovascular;  Laterality: N/A;  . LUMBAR LAMINECTOMY/DECOMPRESSION MICRODISCECTOMY Left 11/02/2017   Procedure: MICRODISCECTOMY LUMBAR THREE- LUMBAR FOUR, LEFT;  Surgeon: CAshok Pall MD;  [place] [facility]  Service: Neurosurgery;  Laterality: Left;  . NASAL SEPTUM SURGERY  1979  . PATCH ANGIOPLASTY Left 04/23/2015   Procedure: PATCH ANGIOPLASTY USING 1CM X 6CM XENOSURE BIOLOGIC PATCH;  Surgeon: BConrad [place] MD;  [place] [facility]  Service: Vascular;  Laterality: Left;  . [place] . SHOULDER OPEN ROTATOR CUFF REPAIR  10/14/2011   Procedure: ROTATOR CUFF REPAIR SHOULDER OPEN;  Surgeon: JJohnn Hai MD;  [facility];  Service: Orthopedics;  Laterality: Right;  . SUBACROMIAL DECOMPRESSION  10/14/2011   Procedure: SUBACROMIAL DECOMPRESSION;  Surgeon: Johnn Hai, MD;  [facility];  Service: Orthopedics;  Laterality: Right;  . TRIGGER FINGER RELEASE Right ~ 2013-2014   "3rd & 4th digits"    Current Medications: Outpatient Medications Prior to Visit  Medication Sig Dispense Refill  . acetaminophen (TYLENOL) 325 MG tablet Take 650 mg by mouth every 6 (six) hours as needed for moderate pain or headache.     Marland Kitchen alum & mag hydroxide-simeth (MAALOX/MYLANTA) 200-200-20 MG/5ML suspension Take 15 mLs by mouth every 6 (six) hours as needed for indigestion or heartburn.    Marland Kitchen aspirin 81 MG tablet Take 1 tablet (81 mg total) by mouth daily.    Marland Kitchen atorvastatin (LIPITOR) 40 MG tablet Take 40 mg by mouth daily.    . chlorhexidine (PERIDEX) 0.12 % solution SWISH WITH 1-2 OUNCE FOR 30 SECONDS THEN SPIT TWICE DAILY as needed for flare up  0  . clopidogrel (PLAVIX) 75 MG tablet TAKE 1 TABLET BY MOUTH ONCE DAILY 90  tablet 3  . colchicine 0.6 MG tablet Take 0.6 mg by mouth daily as needed (gout).     . Cyanocobalamin (B-12) 5000 MCG CAPS Take 5,000 mcg by mouth daily.    . dorzolamide-timolol (COSOPT) 22.3-6.8 MG/ML ophthalmic solution Place 1 drop into both eyes 2 (two) times daily.    . empagliflozin (JARDIANCE) 25 MG TABS tablet Take 25 mg by mouth daily.    Marland Kitchen guaiFENesin (MUCINEX) 600 MG 12 hr tablet Take 600 mg by mouth 2 (two) times daily as needed for cough.    Marland Kitchen HUMALOG KWIKPEN 200 UNIT/ML SOPN Inject 10-22 Units as directed 3 (three) times daily. SLIDING SCALE  1  . ibuprofen (ADVIL,MOTRIN) 200 MG tablet Take 400 mg by mouth every 6 (six) hours as needed for moderate pain.    Marland Kitchen insulin glargine (LANTUS) 100 UNIT/ML injection Inject 80 Units into the skin at bedtime.     . Insulin Pen Needle (FIFTY50 PEN NEEDLES) 31G X 8 MM MISC     . isosorbide mononitrate (IMDUR) 60 MG 24 hr tablet Take 1 tablet (60 mg total) by mouth daily. 90 tablet 3  . lisinopril (PRINIVIL,ZESTRIL) 5 MG tablet Take 1 tablet (5 mg total) by mouth daily. 90 tablet 3  . LYRICA 100 MG capsule Take 100 mg by mouth 2 (two) times daily as needed. For neuropathic pain  2  . Melatonin 5 MG TABS Take 5 mg by mouth at bedtime.    . metFORMIN (GLUCOPHAGE) 1000 MG tablet Take 1,000 mg by mouth 2 (two) times daily with a meal.    . metoprolol tartrate (LOPRESSOR) 100 MG tablet Take 1 tablet (100 mg total) by mouth 2 (two) times daily. 180 tablet 3  . nitroGLYCERIN (NITROSTAT) 0.4 MG SL tablet PLACE 1 TABLET UNDER THE TONGUE EVERY 5 MINUTES AS NEEDED FOR CHEST PAIN 25 tablet 3  . Omega-3 Fatty Acids (FISH OIL PO) Take 520 mg by mouth daily.    Marland Kitchen OVER THE COUNTER MEDICATION Apply 1 application topically daily as needed (nerve pain). Theraworx otc pain relief foam     . pantoprazole (PROTONIX) 40 MG tablet TAKE 1 TABLET BY MOUTH TWICE DAILY 180 tablet 1  . Probiotic Product (PROBIOTIC PO) Take 1 tablet by mouth daily.    Marland Kitchen pyridoxine (B-6) 100  MG tablet Take 100 mg by mouth daily.    Marland Kitchen tiZANidine (ZANAFLEX) 4  MG tablet Take 1 tablet by mouth at bedtime.   1  . triamcinolone cream (KENALOG) 0.1 % Apply 1 application topically daily as needed (lichen planus). With cetaphil    . atorvastatin (LIPITOR) 40 MG tablet TAKE 1 TABLET BY MOUTH ONCE DAILY BEFORE BREAKFAST (Patient not taking: Reported on 04/27/2018) 90 tablet 3  . Hypromellose (ARTIFICIAL TEARS OP) Place 1 drop into both eyes daily as needed (dry eyes).    . metFORMIN (GLUCOPHAGE) 1000 MG tablet TAKE ONE TABLET BY MOUTH TWICE DAILY WITH A MEAL (Patient not taking: Reported on 04/27/2018) 60 tablet 0  . nystatin (MYCOSTATIN) 100000 UNIT/ML suspension SWISH WITH 5 MLS IN MOUTH 4 TIMES DAILY FOR 2 MINUTES THEN SWALLOW  3   No facility-administered medications prior to visit.      Allergies:   Gabapentin; Morphine and related; and Naproxen   Social History   Socioeconomic History  . Marital status: Married    Spouse name: Not on file  . Number of children: 5  . Years of education: College  . Highest education level: Not on file  Occupational History  . Not on file  Social Needs  . Financial resource strain: Not on file  . Food insecurity:    Worry: Not on file    Inability: Not on file  . Transportation needs:    Medical: Not on file    Non-medical: Not on file  Tobacco Use  . Smoking status: Former Smoker    Packs/day: 0.50    Years: 20.00    Pack years: 10.00    Types: Cigarettes    Last attempt to quit: 10/11/1988    Years since quitting: 29.5  . Smokeless tobacco: Never Used  Substance and Sexual Activity  . Alcohol use: No    Alcohol/week: 0.0 standard drinks    Comment: Quit drinking in 1990  . Drug use: No  . Sexual activity: Not Currently  Lifestyle  . Physical activity:    Days per week: Not on file    Minutes per session: Not on file  . Stress: Not on file  Relationships  . Social connections:    Talks on phone: Not on file    Gets together:  Not on file    Attends religious service: Not on file    Active member of club or organization: Not on file    Attends meetings of clubs or organizations: Not on file    Relationship status: Not on file  Other Topics Concern  . Not on file  Social History Narrative   Drinks about 2 cups of coffee a day, occasional diet pepsi.     Quit smoking approximately 27 years ago.  Family History:  The patient's family history includes Cancer in his sister; Diabetes in his brother, mother, and sister; Heart attack (age of onset: 40) in his father; Hypertension in his brother and sister; Varicose Veins in his mother and sister.   ROS:   Please see the history of present illness.    Review of Systems  All other systems reviewed and are negative.     PHYSICAL EXAM:   VS:  BP 108/62   Pulse 74   Ht 5' 11.5" (1.816 m)   Wt 247 lb 1.9 oz (112.1 kg)   SpO2 98%   BMI 33.99 kg/m    GEN: Well nourished, well developed, in no acute distress , uses cane HEENT: normal  Neck: no JVD, carotid bruits, or masses Cardiac: RRR; no murmurs, rubs,  or gallops,no edema  Respiratory:  clear to auscultation bilaterally, normal work of breathing GI: soft, nontender, nondistended, + BS MS: no deformity or atrophy  Skin: warm and dry, no rash Neuro:  Alert and Oriented x 3, Strength and sensation are intact Psych: euthymic mood, full affect      Wt Readings from Last 3 Encounters:  04/27/18 247 lb 1.9 oz (112.1 kg)  03/24/18 243 lb (110.2 kg)  03/09/18 246 lb (111.6 kg)      Studies/Labs Reviewed:   EKG:  None today  Recent Labs: 03/10/2018: BUN 16; Creatinine, Ser 1.19; Hemoglobin 12.1; Platelets 182; Potassium 4.0; Sodium 137   Lipid Panel No results found for: CHOL, TRIG, HDL, CHOLHDL, VLDL, LDLCALC, LDLDIRECT  Additional studies/ records that were reviewed today include:  ECHO  (07/21/15) - Left ventricle: The cavity size was normal. There was severe focal basal and mild concentric  hypertrophy of the left ventricle. Systolic function was vigorous. The estimated ejection fraction was in the range of 65% to 70%. Wall motion was normal; there were no regional wall motion abnormalities. Doppler parameters are consistent with abnormal left ventricular relaxation (grade 1 diastolic dysfunction). Doppler parameters are consistent with elevated ventricular end-diastolic filling pressure.   Cardiac catheterization 03/09/2018: CORONARY STENT INTERVENTION  LEFT HEART CATH AND CORONARY ANGIOGRAPHY  Conclusion     Prox RCA lesion is 30% stenosed.  Acute Mrg lesion is 99% stenosed.  Mid RCA to Dist RCA lesion is 80% stenosed.  Dist RCA lesion is 99% stenosed.  Ost Ramus lesion is 50% stenosed.  Prox Cx-1 lesion is 50% stenosed.  Prox Cx-2 lesion is 50% stenosed.  Previously placed Mid Cx stent (unknown type) is widely patent.  Ost LAD to Prox LAD lesion is 40% stenosed.  Ost 1st Diag to 1st Diag lesion is 90% stenosed.  Prox LAD to Mid LAD lesion is 80% stenosed.  Mid LAD to Dist LAD lesion is 20% stenosed.  A drug-eluting stent was successfully placed using a STENT SIERRA 3.00 X 15 MM.  Post intervention, there is a 0% residual stenosis.  1. Diffuse triple vessel CAD 2. The LAD is a large caliber vessel that courses to the apex. The proximal LAD has diffuse moderate disease but no focally obstructive lesions. The mid LAD has an eccentric, focal, hazy stenosis. The distal LAD has mild diffuse disease. The first diagonal branch is a small caliber vessel with diffuse severe disease, unchanged from prior cath and not large enough for PCI.  3. The Circumflex has moderate proximal and mid stenosis. The obtuse marginal stent is patent without restenosis. The moderate to large caliber intermediate branch has moderate proximal stenosis.  4. The RCA is small caliber, non-dominant vessel with diffuse severe stenosis in the small PDA and in the RV marginal  branch, unchanged from last cath and too small for PCI.  5. Successful PTCA/DES x 1 mid LAD.   Recommendations: The PDA and the Diagonal are too small for stenting. Both of these branches have chronic lesions and are unchanged since the last cath in 2017. Either branch could cause angina. The LAD was stented today in a hazy mid stenosis. Hopefully this will relieve some of his angina.   Recommend uninterrupted dual antiplatelet therapy with Aspirin 80m daily and Clopidogrel 725mdailyfor a minimum of 12 months (ACS - Class I recommendation).    Diagnostic Diagram       Post-Intervention Diagram          ASSESSMENT:  1. Angina pectoris ([place])   2. Coronary artery disease due to lipid rich plaque   3. Essential hypertension   4. Bilateral carotid artery disease, unspecified type ([place])      PLAN:  In order of problems listed above:  Coronary artery disease/Angina  - Stent to mid LAD 03/09/18, diag too small, additional sources for angina noted. Imdur. Could add amlodipine or Ranexa in future. Did not want to participate in rehab. DAPT. Prior to this had cath 05/13/16  - Isosorbide 60, metoprolol now 100 twice a day, increased on 04/21/2018. Helping with angina.  Could sometimes ice be exacerbating potential esophageal spasm.  He is going to have his gallbladder evaluated as well.  Essential hypertension  - Isosorbide, lisinopril, metoprolol.  Previously, his lisinopril 5 mg was on hold.  He is now taking this once again.  Blood pressure doing okay.  Asymptomatic.  Carotid artery disease  - Endarterectomy 04/23/15, left with no TIA symptoms. Dr. Bridgett Larsson. Aggressive secondary prevention, continue.  Once again no changes.  Hyperlipidemia  - Lipitor. PCP has been following this.  No changes.  LDL 65.  No myalgias.  Feels well.  Diabetes with insulin use  -A1c now 6.4, excellent.. Medicines reviewed.  Lisinopril helpful for renal protection.  Obesity  -Once again encouraged  weight loss.  Vertigo  -Has been battling this for the past couple years. Has seen several doctors for this.  No current issues.  Medication Adjustments/Labs and Tests Ordered: Current medicines are reviewed at length with the patient today.  Concerns regarding medicines are outlined above.  Medication changes, Labs and Tests ordered today are listed in the Patient Instructions below.  15-monthfollow-up with LMickel Baas 1 year with me  Patient Instructions  Medication Instructions:  The current medical regimen is effective;  continue present plan and medications.  Follow-Up: Follow up in 6 months with LCecilie Kicks NP.  You will receive a letter in the mail 2 months before you are due.  Please call [place] you receive this letter to schedule your follow up appointment.  Follow up in 1 year with Dr. SMarlou Porch  You will receive a letter in the mail 2 months before you are due.  Please call [place] you receive this letter to schedule your follow up appointment.  If you need a refill on your cardiac medications before your next appointment, please call your pharmacy.  [facility]        Signed, MCandee Furbish MD  04/27/2018 12:05 PM    [address]  [id number]: [phone] Fax: [phone]

## 2018-05-01 ENCOUNTER — Ambulatory Visit
Admission: RE | Admit: 2018-05-01 | Discharge: 2018-05-01 | Disposition: A | Discharge status: Home / Self Care | Payer: Medicare Other | Source: Ambulatory Visit | Attending: Internal Medicine | Admitting: Internal Medicine

## 2018-05-01 DIAGNOSIS — R079 Chest pain, unspecified: Secondary | ICD-10-CM

## 2018-05-01 DIAGNOSIS — R110 Nausea: Secondary | ICD-10-CM

## 2018-05-01 DIAGNOSIS — K760 Fatty (change of) liver, not elsewhere classified: Secondary | ICD-10-CM | POA: Diagnosis not present

## 2018-05-13 DIAGNOSIS — E1165 Type 2 diabetes mellitus with hyperglycemia: Secondary | ICD-10-CM | POA: Diagnosis not present

## 2018-05-18 DIAGNOSIS — I2510 Atherosclerotic heart disease of native coronary artery without angina pectoris: Secondary | ICD-10-CM | POA: Diagnosis not present

## 2018-05-18 DIAGNOSIS — E1129 Type 2 diabetes mellitus with other diabetic kidney complication: Secondary | ICD-10-CM | POA: Diagnosis not present

## 2018-05-18 DIAGNOSIS — Z794 Long term (current) use of insulin: Secondary | ICD-10-CM | POA: Diagnosis not present

## 2018-05-18 DIAGNOSIS — I10 Essential (primary) hypertension: Secondary | ICD-10-CM | POA: Diagnosis not present

## 2018-05-31 ENCOUNTER — Telehealth: Payer: Self-pay | Admitting: Cardiology

## 2018-05-31 DIAGNOSIS — R0789 Other chest pain: Principal | ICD-10-CM

## 2018-05-31 DIAGNOSIS — R079 Chest pain, unspecified: Secondary | ICD-10-CM

## 2018-05-31 NOTE — Telephone Encounter (Signed)
Pt c/o of Chest Pain: STAT if CP now or developed within 24 hours  1. Are you having CP right now? Chest Pressure- not right now  2. Are you experiencing any other symptoms (ex. SOB, nausea, vomiting, sweating)? SOB when he exert himself  3. How long have you been experiencing CP? About 2 weeks he  Michela Pitcher things start changing  4. Is your CP continuous or coming and going? Chest Pressure off and on  5. Have you taken Nitroglycerin? Nitroglycerin on Monday and Tuesday ?

## 2018-05-31 NOTE — Telephone Encounter (Signed)
Try increasing Imdur to 90mg  PO QD Candee Furbish, MD

## 2018-05-31 NOTE — Telephone Encounter (Signed)
Pt reports having trouble with chest pressure since his last office visit.  He started "Metoprolol as ordered which helped for a while".  He had a Abdominal U/S and ruled ou for gallstones.  Gas-X and diet changes have helped as well but chest pressure still occurs.  He has noticed chest pressure in the middle to left side of his chest after becoming SOB with changing clothes for bed and laying down.  He took SL Ntg X 1 with relief.  A 2nd time he had been on his stationary bike and the pressure occurred again.  Relieved with SL Ntg X 1.  He feels like this chest pressure maybe r/t his heart.  At other times he has chest pressure to the right side of his chest that he feels is r/t indigestion.  He has not taken his BP/HR during these episodes but reports they have been "good".  He did check his BS which was normal.  Advised pt to continue current therapy, to use SL ntg as needed and I will have Dr Marlou Porch review information.  Pt is aware I will c/b with further instructions/orders once I have them.  He was grateful for the call and time I took reviewing his s/s.

## 2018-06-01 MED ORDER — ISOSORBIDE MONONITRATE ER 60 MG PO TB24: 90.0000 mg | ORAL_TABLET | Freq: Every day | ORAL | 3 refills | Status: DC

## 2018-06-01 NOTE — Telephone Encounter (Signed)
Called patient with Dr. Marlou Porch recommendation. Patient verbalized understanding. Patient will try  Imdur 90 mg by mouth daily.

## 2018-06-01 NOTE — Addendum Note (Signed)
Addended by: Aris Georgia, Cerra Eisenhower L on: 06/01/2018 08:18 AM   Modules accepted: Orders

## 2018-06-09 DIAGNOSIS — H34232 Retinal artery branch occlusion, left eye: Secondary | ICD-10-CM | POA: Diagnosis not present

## 2018-06-09 DIAGNOSIS — E113512 Type 2 diabetes mellitus with proliferative diabetic retinopathy with macular edema, left eye: Secondary | ICD-10-CM | POA: Diagnosis not present

## 2018-06-09 DIAGNOSIS — E113591 Type 2 diabetes mellitus with proliferative diabetic retinopathy without macular edema, right eye: Secondary | ICD-10-CM | POA: Diagnosis not present

## 2018-06-09 DIAGNOSIS — H348322 Tributary (branch) retinal vein occlusion, left eye, stable: Secondary | ICD-10-CM | POA: Diagnosis not present

## 2018-06-30 ENCOUNTER — Other Ambulatory Visit: Payer: Self-pay | Admitting: Cardiology

## 2018-06-30 DIAGNOSIS — R079 Chest pain, unspecified: Secondary | ICD-10-CM

## 2018-06-30 DIAGNOSIS — R0789 Other chest pain: Principal | ICD-10-CM

## 2018-07-28 DIAGNOSIS — E11319 Type 2 diabetes mellitus with unspecified diabetic retinopathy without macular edema: Secondary | ICD-10-CM | POA: Diagnosis not present

## 2018-07-28 DIAGNOSIS — Z125 Encounter for screening for malignant neoplasm of prostate: Secondary | ICD-10-CM | POA: Diagnosis not present

## 2018-07-28 DIAGNOSIS — E782 Mixed hyperlipidemia: Secondary | ICD-10-CM | POA: Diagnosis not present

## 2018-07-28 DIAGNOSIS — R82998 Other abnormal findings in urine: Secondary | ICD-10-CM | POA: Diagnosis not present

## 2018-07-28 DIAGNOSIS — N183 Chronic kidney disease, stage 3 (moderate): Secondary | ICD-10-CM | POA: Diagnosis not present

## 2018-07-31 DIAGNOSIS — E1129 Type 2 diabetes mellitus with other diabetic kidney complication: Secondary | ICD-10-CM | POA: Diagnosis not present

## 2018-07-31 DIAGNOSIS — Z0000 Encounter for general adult medical examination without abnormal findings: Secondary | ICD-10-CM | POA: Diagnosis not present

## 2018-07-31 DIAGNOSIS — M545 Low back pain: Secondary | ICD-10-CM | POA: Diagnosis not present

## 2018-07-31 DIAGNOSIS — Z794 Long term (current) use of insulin: Secondary | ICD-10-CM | POA: Diagnosis not present

## 2018-08-04 ENCOUNTER — Telehealth: Payer: Self-pay | Admitting: Cardiology

## 2018-08-04 NOTE — Telephone Encounter (Signed)
New message    Pt c/o medication issue:  1. Name of Medication: isosorbide mononitrate (IMDUR) 60 MG 24 hr tablet  2. How are you currently taking this medication (dosage and times per day)? Full pill in morning and 1/2 in afternoon   3. Are you having a reaction (difficulty breathing--STAT)? Yes   4. What is your medication issue? Pt stated that yesterday and today he is very tired while walking short distances and tying  shoes .

## 2018-08-04 NOTE — Telephone Encounter (Signed)
Spoke with patient who reports yesterday morning he had SOB/chest pressure and tightness after walking in his house to put on his shoes.  The pressure/tightness was a 6 or 7 on a pain level scale from 1-10.  He used (1) SL Ntg with relief.  The discomfort was in the center of his chest.  He has SOB with the pain but no radiation, n/v or diaphoresis.  This same type of incident occurred this AM and as soon as he felt it starting he laid down and used SL Ntg X 1 with relief.  This episode lasted only a short amount of time.  He is asking what he needs to do at this point.  Advised pt since he is not having any active chest pain or SOB he should continue to monitor while continuing his normal activities.  Advised SL Ntg can resolve some types of pain that is not cardiac related such as GI issues.  He is asking if he should/can further increase his Isosorbide since that helped the last time he increased to 90 mg daily.  Advised I will forward to Dr Marlou Porch for review and orders.  Pt is aware I will call back once I hear from him but he should c/b if pain/tightness reoccurs prior to then.

## 2018-08-07 NOTE — Telephone Encounter (Signed)
Imdur 120. Thanks Candee Furbish, MD

## 2018-08-07 NOTE — Telephone Encounter (Signed)
Spoke with patient who reports he feels he may be created some of his symptoms by taking OTC medications for cold/cough. Advised he should always discuss OTC medications with either his physician or pharmacist prior to taking anything. He states understanding.  He is aware Dr Marlou Porch did give orders to increase Isosorbide to 120 mg daily.  He is going to wait on this right now and call back if he decides to increase medication.

## 2018-09-05 ENCOUNTER — Telehealth: Payer: Self-pay | Admitting: *Deleted

## 2018-09-05 DIAGNOSIS — R079 Chest pain, unspecified: Secondary | ICD-10-CM

## 2018-09-05 DIAGNOSIS — R0789 Other chest pain: Principal | ICD-10-CM

## 2018-09-05 MED ORDER — ISOSORBIDE MONONITRATE ER 60 MG PO TB24: 60.0000 mg | ORAL_TABLET | Freq: Two times a day (BID) | ORAL | 3 refills | Status: DC

## 2018-09-05 NOTE — Telephone Encounter (Signed)
   Primary Cardiologist: Candee Furbish, MD  Chart reviewed as part of pre-operative protocol coverage. Patient was contacted 09/05/2018 in reference to pre-operative risk assessment for pending surgery as outlined below.  Seth Young underwent a cardiac catheterization with PCI/DES to LAD 03/09/2018, at which time he was recommended for uninterrupted DAPT with aspirin and plavix for a minimum of 1 year.   Patient was notified of need to delay routine colonoscopy screening until at least August 2020. He reported improved chest discomfort after increasing his imdur to 90mg  daily but asked if going up to 120mg  daily may be helpful at reducing chest discomfort further. Per chart review, Dr. Marlou Porch had recommended increasing his imdur to 120mg  daily back in December 2019. Encouraged patient to increase his imdur to 120mg  daily at this time. Will send an updated prescription at this time.   Will route this recommendation to Dr. Perley Jain office and remove from the preop pool.  Callback: - Please ensure preop recommendations are received by Dr. Perley Jain offices and confirm need to cancel/delay procedure until August 2020.     Abigail Butts, PA-C 09/05/2018, 10:32 AM

## 2018-09-05 NOTE — Telephone Encounter (Signed)
Called [facility], spoke with and confirmed that they have received the surgery clearance for Mr. Anthis and they have cancelled the procedure.

## 2018-09-05 NOTE — Telephone Encounter (Signed)
   [facility] Pre-operative Risk Assessment    Request for surgical clearance:  1. What type of surgery is being performed? COLONOSCOPY   2. When is this surgery scheduled? 10/05/18   3. What type of clearance is required (medical clearance vs. Pharmacy clearance to hold med vs. Both)? MEDICAL  4. Are there any medications that need to be held prior to surgery and how long?PLAVIX AND ASA  5. Practice name and name of physician performing surgery?  [facility]; DR. MAGOD   6. What is your office phone number [phone]    7.   What is your office fax number [phone]  8.   Anesthesia type (None, local, MAC, general) ? PROPOFOL ?   Julaine Hua 09/05/2018, 10:05 AM  _________________________________________________________________   (provider comments below)

## 2018-09-15 DIAGNOSIS — E113512 Type 2 diabetes mellitus with proliferative diabetic retinopathy with macular edema, left eye: Secondary | ICD-10-CM | POA: Diagnosis not present

## 2018-09-15 DIAGNOSIS — E113591 Type 2 diabetes mellitus with proliferative diabetic retinopathy without macular edema, right eye: Secondary | ICD-10-CM | POA: Diagnosis not present

## 2018-09-15 DIAGNOSIS — H34232 Retinal artery branch occlusion, left eye: Secondary | ICD-10-CM | POA: Diagnosis not present

## 2018-09-15 DIAGNOSIS — H348322 Tributary (branch) retinal vein occlusion, left eye, stable: Secondary | ICD-10-CM | POA: Diagnosis not present

## 2018-10-25 ENCOUNTER — Telehealth: Payer: Self-pay

## 2018-10-25 NOTE — Telephone Encounter (Addendum)
Addendum: I spoke with Seth Young personally.  He is currently doing well without any escalating anginal symptoms.  No fevers chills nausea vomiting syncope.  No shortness of breath.  He has ample supply of his medications.  He knows to contact us if any worrisome symptoms develop.  He is fine with postponing his appointment given the coronavirus.  Seth Young has an appointment with Dr. Marlou Porch 3/23. He was called to see how he is doing and if he could reschedule to a later date due to COVID-19 pandemic.  Seth Young states he has had 2 episodes since his visit with Dr. Marlou Porch last year that he has had to take NTG, both within the last few weeks.  Both episodes were similar. One occurred when he just got home from work and he walked into his room to get changed. The second episode occurred when he was walking around getting ready for bed. Both instances included CP and SOB. He sat down, took 1 NTG tablet and the symptoms resolved.  Otherwise, he feels great and is asymptomatic now.  He understands his symptoms will be reviewed with Dr. Marlou Porch and he will be called to confirm appointment or delay it (he states he would rather wait if at all possible).  03/09/18 cath report: "1. Diffuse triple vessel CAD 2. The LAD is a large caliber vessel that courses to the apex. The proximal LAD has diffuse moderate disease but no focally obstructive lesions. The mid LAD has an eccentric, focal, hazy stenosis. The distal LAD has mild diffuse disease. The first diagonal branch is a small caliber vessel with diffuse severe disease, unchanged from prior cath and not large enough for PCI.  3. The Circumflex has moderate proximal and mid stenosis. The obtuse marginal stent is patent without restenosis. The moderate to large caliber intermediate branch has moderate proximal stenosis.  4. The RCA is small caliber, non-dominant vessel with diffuse severe stenosis in the small PDA and in the RV marginal branch, unchanged from last  cath and too small for PCI.  5. Successful PTCA/DES x 1 mid LAD.   Recommendations: The PDA and the Diagonal are too small for stenting. Both of these branches have chronic lesions and are unchanged since the last cath in 2017. Either branch could cause angina. The LAD was stented today in a hazy mid stenosis. Hopefully this will relieve some of his angina.   Recommend uninterrupted dual antiplatelet therapy with Aspirin 81mg  daily and Clopidogrel 75mg  daily for a minimum of 12 months (ACS - Class I recommendation)."

## 2018-10-30 ENCOUNTER — Ambulatory Visit: Payer: Medicare Other | Admitting: Cardiology

## 2018-11-07 NOTE — Telephone Encounter (Signed)
Please arrange a telehealth visit with Dr. Marlou Porch in next 6 weeks.

## 2018-12-13 ENCOUNTER — Other Ambulatory Visit: Payer: Self-pay

## 2018-12-13 ENCOUNTER — Encounter: Payer: Self-pay | Admitting: Cardiology

## 2018-12-13 ENCOUNTER — Telehealth (INDEPENDENT_AMBULATORY_CARE_PROVIDER_SITE_OTHER): Payer: Medicare Other | Admitting: Cardiology

## 2018-12-13 VITALS — BP 138/80 | HR 71 | Ht 71.5 in | Wt 254.0 lb

## 2018-12-13 DIAGNOSIS — I2583 Coronary atherosclerosis due to lipid rich plaque: Secondary | ICD-10-CM | POA: Diagnosis not present

## 2018-12-13 DIAGNOSIS — IMO0001 Reserved for inherently not codable concepts without codable children: Secondary | ICD-10-CM

## 2018-12-13 DIAGNOSIS — I739 Peripheral vascular disease, unspecified: Secondary | ICD-10-CM

## 2018-12-13 DIAGNOSIS — I209 Angina pectoris, unspecified: Secondary | ICD-10-CM

## 2018-12-13 DIAGNOSIS — E119 Type 2 diabetes mellitus without complications: Secondary | ICD-10-CM

## 2018-12-13 DIAGNOSIS — E7800 Pure hypercholesterolemia, unspecified: Secondary | ICD-10-CM

## 2018-12-13 DIAGNOSIS — I2510 Atherosclerotic heart disease of native coronary artery without angina pectoris: Secondary | ICD-10-CM | POA: Diagnosis not present

## 2018-12-13 DIAGNOSIS — I779 Disorder of arteries and arterioles, unspecified: Secondary | ICD-10-CM

## 2018-12-13 DIAGNOSIS — I10 Essential (primary) hypertension: Secondary | ICD-10-CM

## 2018-12-13 DIAGNOSIS — Z794 Long term (current) use of insulin: Secondary | ICD-10-CM

## 2018-12-13 NOTE — Progress Notes (Signed)
Virtual Visit via Video Note   This visit type was conducted due to national recommendations for restrictions regarding the COVID-19 Pandemic (e.g. social distancing) in an effort to limit this patient's exposure and mitigate transmission in our community.  Due to his co-morbid illnesses, this patient is at least at moderate risk for complications without adequate follow up.  This format is felt to be most appropriate for this patient at this time.  All issues noted in this document were discussed and addressed.  A limited physical exam was performed with this format.  Please refer to the patient's chart for his consent to telehealth for [facility].   Date:  12/13/2018   ID:  Seth Young, DOB [birth year], MRN [id number]  Patient Location: Home Provider Location: Home  PCP:  Burnard Bunting, MD  Cardiologist:  Candee Furbish, MD  Electrophysiologist:  None   Evaluation Performed:  Follow-Up Visit  Chief Complaint: Coronary artery disease angina follow-up  History of Present Illness:    Seth Young is a 78 y.o. male with coronary artery disease hypertension hyperlipidemia and diabetes former patient of Dr. Mare Ferrari is here for follow-up.  Prior difficulty with antianginals.  Have been increasing.  Gallbladder was evaluated by Dr. [name], I sometimes exacerbates chest discomfort possible esophageal spasm.  Left heart catheterization in 219, diffuse triple-vessel CAD with successful DES and PCI to mid LAD.  The PDA and diagonal are too small for stenting.  Chronic lesions.  Could cause angina.  Feels better with Imdur 51m BID.  Does not really feel like he is having any "heart "issues.  Still having some back discomfort post his surgery last March.  Takes chest congestion relief, runny nose OTC.   The patient does not have symptoms concerning for COVID-19 infection (fever, chills, cough, or new shortness of breath).    Past Medical History:  Diagnosis Date  . Arthritis    "knees, toes, hands, probably in my back" (03/09/2018)  . Ataxia   . Basal cell carcinoma    left temporal area-no residual  . Colon polyps 10-12-11   past hx.  . Coronary artery disease   . Exertional angina ([place] 06/26[place]   Cath with DES to the OM, Bioflow protocol   . GERD (gastroesophageal reflux disease)   . H/O hiatal hernia    no problems  . High cholesterol   . History of echocardiogram    Echo 7/19:  Mild LVH, mild focal basal septal hypertrophy, EF 60-65, no RWMA, Gr 1 DD, trivial AI, MAC  . Hypertension   . [place]ic chest pain ([place] 08/07[place]  . Occasional tremors    Bilateral hands  . Peripheral neuropathy   . Pneumonia 1940's X 2; ~ 2017   "infant; walking pneumonia" (03/09/2018)  . Pseudogout    "it moves around"  . Sleep apnea    no cpap used  . Type II diabetes mellitus ([place] dx[place] 1982   nsulin started ~ 2000  . Vertigo    Past Surgical History:  Procedure Laterality Date  . BACK SURGERY    . BASAL CELL CARCINOMA EXCISION Left ~ 2012   face  . CARDIAC CATHETERIZATION  02/2005   Dr. BMare Ferrari negative.  .Marland KitchenCARDIAC CATHETERIZATION N/A 05/13/2016   Procedure: Left Heart Cath and Coronary Angiography;  Surgeon: MJerline Pain MD;  [facility] [facility];  Service: Cardiovascular;  Laterality: N/A;  . CARPAL TUNNEL RELEASE Left 10/2009  . CARPAL TUNNEL RELEASE Right ~ 2013  .  CATARACT EXTRACTION W/ INTRAOCULAR LENS IMPLANT Left 2000's  . CATARACT EXTRACTION W/ INTRAOCULAR LENS IMPLANT Right 2015  . COLONOSCOPY W/ POLYPECTOMY    . CORONARY ANGIOPLASTY WITH STENT PLACEMENT  06/26/2014   "1"  . CORONARY ANGIOPLASTY WITH STENT PLACEMENT  06/26/2014   pLAD 50%, d LAD 60%, oD1 80%,  mD1  70%, CFX 50%, OM 2 70%,  RCA 80%, PDA 95% (<62m), OM1 99%-0% with Bio flow stent       . CORONARY ANGIOPLASTY WITH STENT PLACEMENT  03/09/2018  . CORONARY STENT INTERVENTION N/A 03/09/2018   Procedure: CORONARY STENT INTERVENTION;  Surgeon: MBurnell Blanks MD;  [place]  [facility];  Service: Cardiovascular;  Laterality: N/A;  . ELBOW SURGERY Bilateral ~ 2014   "for blockages; Dr. GAmedeo Plenty  . ENDARTERECTOMY Left 04/23/2015   Procedure: ENDARTERECTOMY CAROTID;  Surgeon: BConrad Lorimor MD;  [place] [facility]  Service: Vascular;  Laterality: Left;  . EYE SURGERY Left    "related go diabetes; laser"  . KNEE ARTHROSCOPY Left 05/2011  . LEFT HEART CATH AND CORONARY ANGIOGRAPHY N/A 03/09/2018   Procedure: LEFT HEART CATH AND CORONARY ANGIOGRAPHY;  Surgeon: MBurnell Blanks MD;  [place] [facility];  Service: Cardiovascular;  Laterality: N/A;  . LEFT HEART CATHETERIZATION WITH CORONARY ANGIOGRAM N/A 06/26/2014   Procedure: LEFT HEART CATHETERIZATION WITH CORONARY ANGIOGRAM;  Surgeon: MBlane Ohara MD;  [facility];  Service: Cardiovascular;  Laterality: N/A;  . LEFT HEART CATHETERIZATION WITH CORONARY ANGIOGRAM N/A 08/07/2014   Procedure: LEFT HEART CATHETERIZATION WITH CORONARY ANGIOGRAM;  Surgeon: MBlane Ohara MD;  [facility];  Service: Cardiovascular;  Laterality: N/A;  . LUMBAR LAMINECTOMY/DECOMPRESSION MICRODISCECTOMY Left 11/02/2017   Procedure: MICRODISCECTOMY LUMBAR THREE- LUMBAR FOUR, LEFT;  Surgeon: CAshok Pall MD;  [place]  Service: Neurosurgery;  Laterality: Left;  . NASAL SEPTUM SURGERY  1979  . PATCH ANGIOPLASTY Left 04/23/2015   Procedure: PATCH ANGIOPLASTY USING 1CM X 6CM XENOSURE BIOLOGIC PATCH;  Surgeon: BConrad Corry MD;  [place]  Service: Vascular;  Laterality: Left;  . PBurns . SHOULDER OPEN ROTATOR CUFF REPAIR  10/14/2011   Procedure: ROTATOR CUFF REPAIR SHOULDER OPEN;  Surgeon: JJohnn Hai MD;  [facility];  Service: Orthopedics;  Laterality: Right;  . SUBACROMIAL DECOMPRESSION  10/14/2011   Procedure: SUBACROMIAL DECOMPRESSION;  Surgeon: JJohnn Hai MD;  Location: [facility];  Service: Orthopedics;  Laterality: Right;  . TRIGGER FINGER RELEASE  Right ~ 2013-2014   "3rd & 4th digits"     Current Meds  Medication Sig  . acetaminophen (TYLENOL) 325 MG tablet Take 650 mg by mouth every 6 (six) hours as needed for moderate pain or headache.   .Marland Kitchenalum & mag hydroxide-simeth (MAALOX/MYLANTA) 200-200-20 MG/5ML suspension Take 15 mLs by mouth every 6 (six) hours as needed for indigestion or heartburn.  .Marland Kitchenaspirin 81 MG tablet Take 1 tablet (81 mg total) by mouth daily.  .Marland Kitchenatorvastatin (LIPITOR) 20 MG tablet Take 20 mg by mouth daily.  . chlorhexidine (PERIDEX) 0.12 % solution SWISH WITH 1-2 OUNCE FOR 30 SECONDS THEN SPIT TWICE DAILY as needed for flare up  . clopidogrel (PLAVIX) 75 MG tablet TAKE 1 TABLET BY MOUTH ONCE DAILY  . colchicine 0.6 MG tablet Take 0.6 mg by mouth daily as needed (gout).   . Cyanocobalamin (B-12) 5000 MCG CAPS Take 5,000 mcg by mouth daily.  . dorzolamide-timolol (COSOPT) 22.3-6.8 MG/ML  ophthalmic solution Place 1 drop into both eyes 2 (two) times daily.  . empagliflozin (JARDIANCE) 25 MG TABS tablet Take 25 mg by mouth daily.  Marland Kitchen guaiFENesin (MUCINEX) 600 MG 12 hr tablet Take 600 mg by mouth 2 (two) times daily as needed for cough.  Marland Kitchen HUMALOG KWIKPEN 200 UNIT/ML SOPN Inject 10-22 Units as directed 3 (three) times daily. SLIDING SCALE  . ibuprofen (ADVIL,MOTRIN) 200 MG tablet Take 400 mg by mouth every 6 (six) hours as needed for moderate pain.  Marland Kitchen insulin glargine (LANTUS) 100 UNIT/ML injection Inject 80 Units into the skin at bedtime.   . Insulin Pen Needle (FIFTY50 PEN NEEDLES) 31G X 8 MM MISC   . isosorbide mononitrate (IMDUR) 60 MG 24 hr tablet Take 1 tablet (60 mg total) by mouth 2 (two) times daily.  Marland Kitchen lisinopril (PRINIVIL,ZESTRIL) 5 MG tablet Take 1 tablet (5 mg total) by mouth daily.  Marland Kitchen LYRICA 100 MG capsule Take 100 mg by mouth 2 (two) times daily as needed. For neuropathic pain  . Melatonin 5 MG TABS Take 5 mg by mouth at bedtime.  . metFORMIN (GLUCOPHAGE) 1000 MG tablet Take 1,000 mg by mouth 2 (two) times  daily with a meal.  . metoprolol tartrate (LOPRESSOR) 100 MG tablet Take 1 tablet (100 mg total) by mouth 2 (two) times daily.  . nitroGLYCERIN (NITROSTAT) 0.4 MG SL tablet PLACE 1 TABLET UNDER THE TONGUE EVERY 5 MINUTES AS NEEDED FOR CHEST PAIN  . Omega-3 Fatty Acids (FISH OIL PO) Take 520 mg by mouth daily.  Marland Kitchen OVER THE COUNTER MEDICATION Apply 1 application topically daily as needed (nerve pain). Theraworx otc pain relief foam   . pantoprazole (PROTONIX) 40 MG tablet TAKE 1 TABLET BY MOUTH TWICE DAILY  . Probiotic Product (PROBIOTIC PO) Take 1 tablet by mouth daily.  Marland Kitchen pyridoxine (B-6) 100 MG tablet Take 100 mg by mouth daily.  Marland Kitchen tiZANidine (ZANAFLEX) 4 MG tablet Take 1 tablet by mouth at bedtime.   . triamcinolone cream (KENALOG) 0.1 % Apply 1 application topically daily as needed (lichen planus). With cetaphil     Allergies:   Gabapentin; Morphine and related; and Naproxen   Social History   Tobacco Use  . Smoking status: Former Smoker    Packs/day: 0.50    Years: 20.00    Pack years: 10.00    Types: Cigarettes    Last attempt to quit: 10/11/1988    Years since quitting: 30.1  . Smokeless tobacco: Never Used  Substance Use Topics  . Alcohol use: No    Alcohol/week: 0.0 standard drinks    Comment: Quit drinking in 1990  . Drug use: No     Family Hx: The patient's family history includes Cancer in his sister; Diabetes in his brother, mother, and sister; Heart attack (age of onset: 9) in his father; Hypertension in his brother and sister; Varicose Veins in his mother and sister.  ROS:   Please see the history of present illness.    Back issues, 3/19 surgery.  All other systems reviewed and are negative.   Prior CV studies:   The following studies were reviewed today:  Cath reviewed. LAD stent  Labs/Other Tests and Data Reviewed:    EKG:  An ECG dated 03/10/18 was personally reviewed today and demonstrated:  NSR with mild first-degree AV block no other abnormalities   Recent Labs: 03/10/2018: BUN 16; Creatinine, Ser 1.19; Hemoglobin 12.1; Platelets 182; Potassium 4.0; Sodium 137   Recent Lipid Panel No results  found for: CHOL, TRIG, HDL, CHOLHDL, LDLCALC, LDLDIRECT  Wt Readings from Last 3 Encounters:  12/13/18 254 lb (115.2 kg)  04/27/18 247 lb 1.9 oz (112.1 kg)  03/24/18 243 lb (110.2 kg)     Objective:    Vital Signs:  BP 138/80   Pulse 71   Ht 5' 11.5" (1.816 m)   Wt 254 lb (115.2 kg)   BMI 34.93 kg/m    VITAL SIGNS:  reviewed GEN:  no acute distress EYES:  sclerae anicteric, EOMI - Extraocular Movements Intact RESPIRATORY:  normal respiratory effort, symmetric expansion SKIN:  no rash, lesions or ulcers. MUSCULOSKELETAL:  no obvious deformities. NEURO:  alert and oriented x 3, no obvious focal deficit PSYCH:  normal affect  ASSESSMENT & PLAN:    Coronary artery disease/angina - Prior stent to mid LAD 03/09/2018 with diagonal and obtuse marginal branch too small for stenting.  Isosorbide utilized as well as metoprolol.  See above.  Increased isosorbide to 120 mg previously.  Essential hypertension - Overall well controlled with current medications.  Carotid endarterectomy/carotid artery disease - Endarterectomy in 2013.  No TIA.  Hyperlipidemia -Continue with atorvastatin, LDL 65.  Diabetes with hypertension and insulin use -Prior hemoglobin A1c 6.4.  Lisinopril for renal protection.  COVID-19 Education: The signs and symptoms of COVID-19 were discussed with the patient and how to seek care for testing (follow up with PCP or arrange E-visit).  The importance of social distancing was discussed today.  Time:   Today, I have spent 11 minutes with the patient with telehealth technology discussing the above problems.     Medication Adjustments/Labs and Tests Ordered: Current medicines are reviewed at length with the patient today.  Concerns regarding medicines are outlined above.   Tests Ordered: No orders of the defined types  were placed in this encounter.   Medication Changes: No orders of the defined types were placed in this encounter.   Disposition:  Follow up in 6 month(s)  Signed, Candee Furbish, MD  12/13/2018 11:12 AM    [place]

## 2018-12-13 NOTE — Patient Instructions (Signed)
Medication Instructions:  The current medical regimen is effective;  continue present plan and medications.  If you need a refill on your cardiac medications before your next appointment, please call your pharmacy.   Follow-Up: Follow up in 6 months with Dr. Skains.  You will receive a letter in the mail 2 months before you are due.  Please call us when you receive this letter to schedule your follow up appointment.   Thank you for choosing [facility]!!       

## 2018-12-20 ENCOUNTER — Telehealth: Payer: Medicare Other | Admitting: Cardiology

## 2018-12-24 IMAGING — CR DG LUMBAR SPINE 2-3V
2 series · 2 of 2 positions shown · non-contrast
Comparison: None.

CLINICAL DATA: Intraoperative localization

EXAM:
LUMBAR SPINE - 2-3 VIEW

[xtable lateral (1 of 2)]
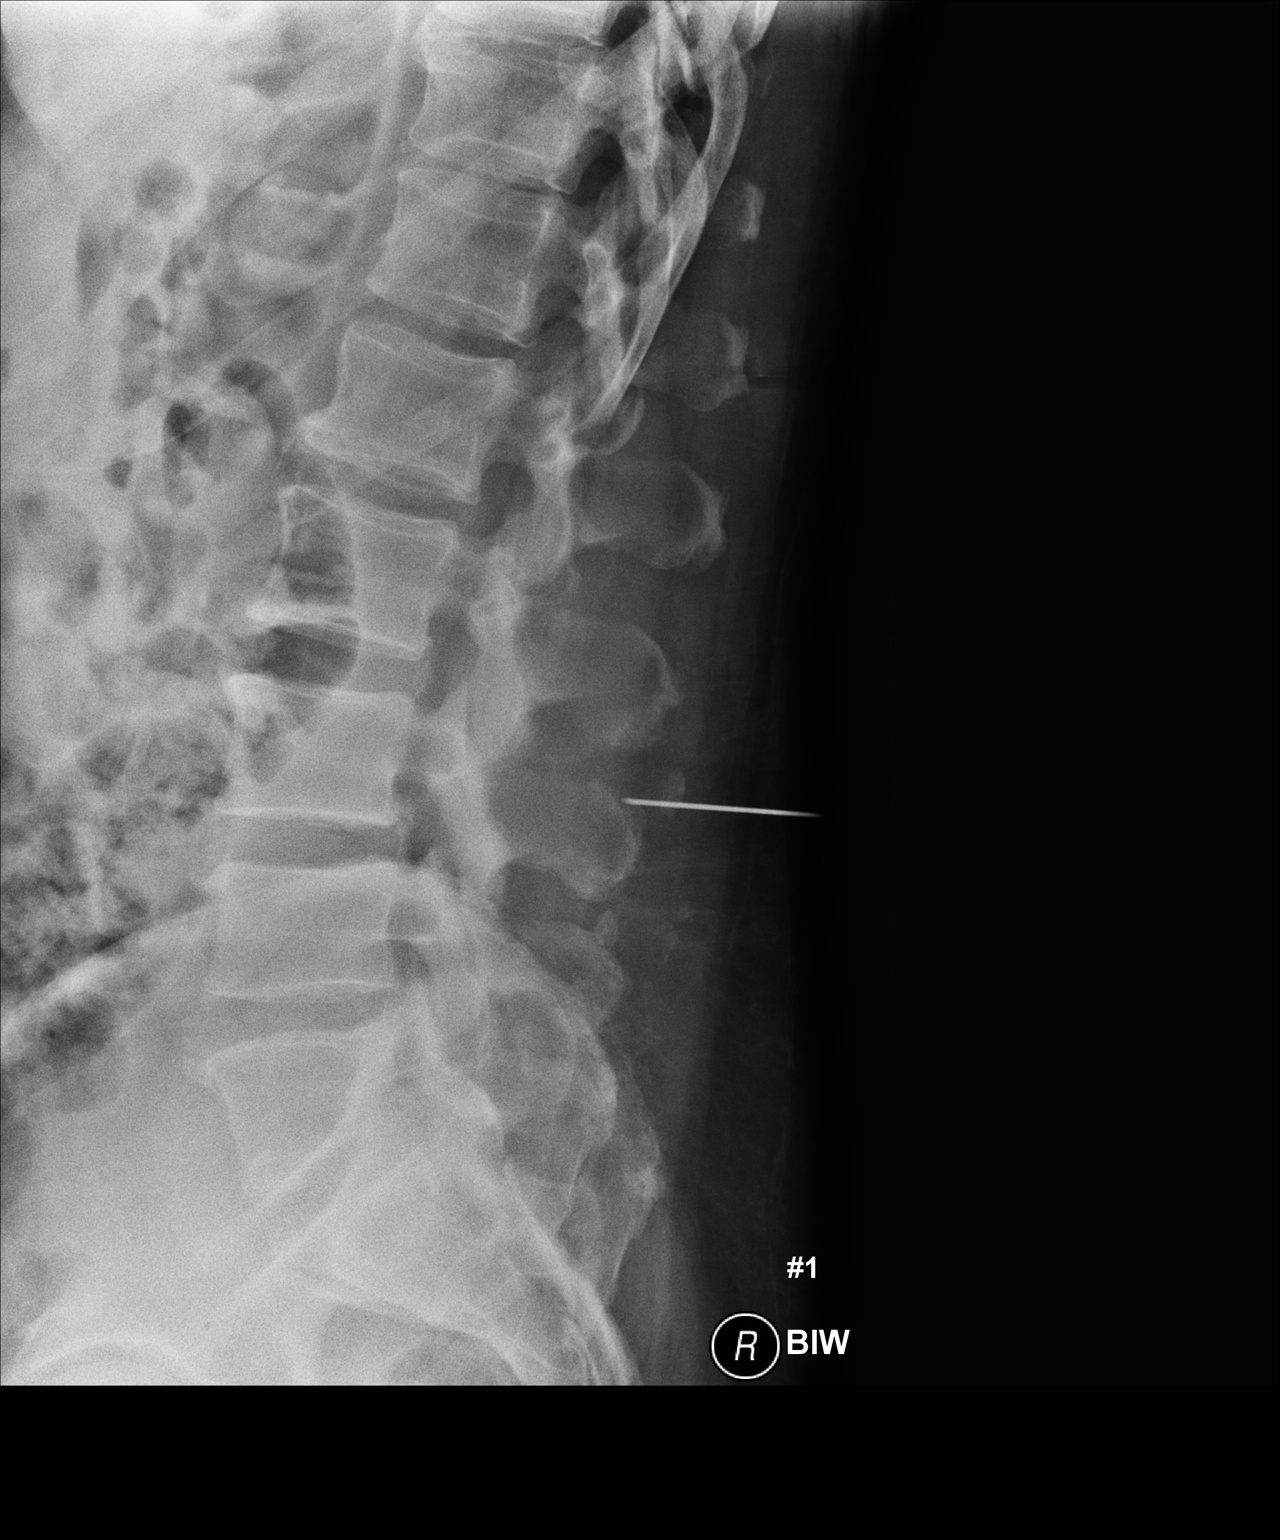

[xtable lateral (2 of 2)]
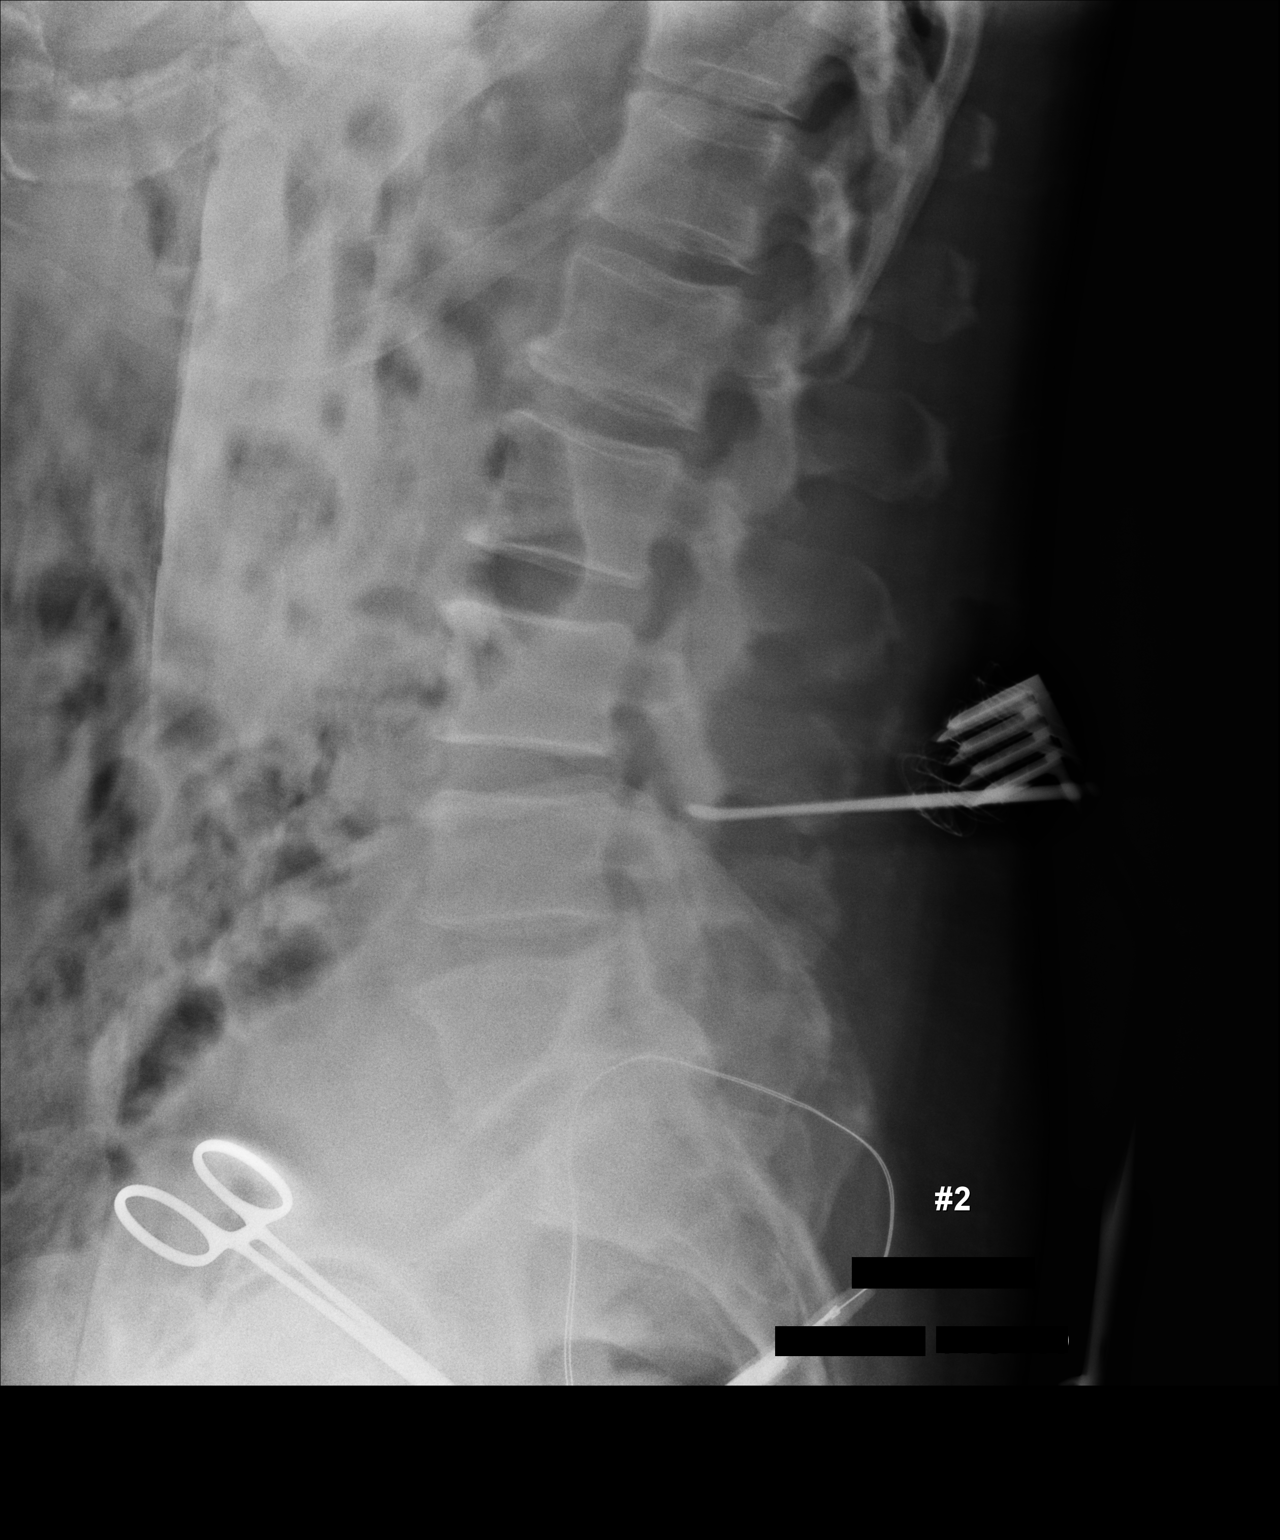

[2 of 2 positions shown; findings below may reference images not displayed]

FINDINGS: Two cross-table lateral intraoperative x-rays of the lumbar spine.

Image number 1: Posterior metallic needle with the tip projecting
along the posterior margin of L3 spinous process.

Image number 2: Posterior metallic probe with the tip projecting
along the posterior margin of L3-4 facet joint.
IMPRESSION: Intraoperative localization as described above.

## 2018-12-26 ENCOUNTER — Other Ambulatory Visit: Payer: Self-pay | Admitting: Cardiology

## 2018-12-27 DIAGNOSIS — Z794 Long term (current) use of insulin: Secondary | ICD-10-CM | POA: Diagnosis not present

## 2018-12-27 DIAGNOSIS — E1139 Type 2 diabetes mellitus with other diabetic ophthalmic complication: Secondary | ICD-10-CM | POA: Diagnosis not present

## 2018-12-27 DIAGNOSIS — E118 Type 2 diabetes mellitus with unspecified complications: Secondary | ICD-10-CM | POA: Diagnosis not present

## 2018-12-27 DIAGNOSIS — E1129 Type 2 diabetes mellitus with other diabetic kidney complication: Secondary | ICD-10-CM | POA: Diagnosis not present

## 2019-01-25 DIAGNOSIS — E1140 Type 2 diabetes mellitus with diabetic neuropathy, unspecified: Secondary | ICD-10-CM | POA: Diagnosis not present

## 2019-01-25 DIAGNOSIS — Z794 Long term (current) use of insulin: Secondary | ICD-10-CM | POA: Diagnosis not present

## 2019-01-25 DIAGNOSIS — Z79899 Other long term (current) drug therapy: Secondary | ICD-10-CM | POA: Diagnosis not present

## 2019-01-25 DIAGNOSIS — E1129 Type 2 diabetes mellitus with other diabetic kidney complication: Secondary | ICD-10-CM | POA: Diagnosis not present

## 2019-02-22 ENCOUNTER — Other Ambulatory Visit: Payer: Self-pay | Admitting: Physician Assistant

## 2019-03-05 ENCOUNTER — Other Ambulatory Visit: Payer: Self-pay | Admitting: Cardiology

## 2019-03-05 DIAGNOSIS — R079 Chest pain, unspecified: Secondary | ICD-10-CM

## 2019-03-27 ENCOUNTER — Other Ambulatory Visit: Payer: Self-pay | Admitting: Cardiology

## 2019-04-09 ENCOUNTER — Telehealth: Payer: Self-pay | Admitting: *Deleted

## 2019-04-09 ENCOUNTER — Encounter: Payer: Self-pay | Admitting: *Deleted

## 2019-04-09 DIAGNOSIS — Z006 Encounter for examination for normal comparison and control in clinical research program: Secondary | ICD-10-CM

## 2019-04-09 NOTE — Telephone Encounter (Signed)
BIOFLOW- V research study: Left message for patient to call the research office for the last telephone follow up.

## 2019-04-10 NOTE — Research (Signed)
BIOFLOW V Research Study: 5 Year telephone follow up completed.. Patient happy to hear from the research department. He has been doing well. Denies chest pain, however it took a ntg prior to swimming ( self medicated just because) the only medication changes noted to date was the his Imdur was increase at some point to BID. (not sure if he already told us). He has an unresolved AE of vertigo and that resolved back in 2018. No other SAE or AE to report. I updated him about the Stent approval 09/2017. I thanked him for his participation. This conclude the BIOFLOW V follow up. [place]  Medications to date : Current Outpatient Medications on File Prior to Visit  Medication Sig Dispense Refill  . acetaminophen (TYLENOL) 325 MG tablet Take 650 mg by mouth every 6 (six) hours as needed for moderate pain or headache.     Marland Kitchen alum & mag hydroxide-simeth (MAALOX/MYLANTA) 200-200-20 MG/5ML suspension Take 15 mLs by mouth every 6 (six) hours as needed for indigestion or heartburn.    Marland Kitchen aspirin 81 MG tablet Take 1 tablet (81 mg total) by mouth daily.    Marland Kitchen atorvastatin (LIPITOR) 20 MG tablet Take 20 mg by mouth daily.    . chlorhexidine (PERIDEX) 0.12 % solution SWISH WITH 1-2 OUNCE FOR 30 SECONDS THEN SPIT TWICE DAILY as needed for flare up  0  . clopidogrel (PLAVIX) 75 MG tablet Take 1 tablet by mouth once daily 90 tablet 2  . colchicine 0.6 MG tablet Take 0.6 mg by mouth daily as needed (gout).     . Cyanocobalamin (B-12) 5000 MCG CAPS Take 5,000 mcg by mouth daily.    . dorzolamide-timolol (COSOPT) 22.3-6.8 MG/ML ophthalmic solution Place 1 drop into both eyes 2 (two) times daily.    . empagliflozin (JARDIANCE) 25 MG TABS tablet Take 25 mg by mouth daily.    Marland Kitchen guaiFENesin (MUCINEX) 600 MG 12 hr tablet Take 600 mg by mouth 2 (two) times daily as needed for cough.    Marland Kitchen HUMALOG KWIKPEN 200 UNIT/ML SOPN Inject 10-22 Units as directed 3 (three) times daily. SLIDING SCALE  1  . ibuprofen (ADVIL,MOTRIN) 200 MG  tablet Take 400 mg by mouth every 6 (six) hours as needed for moderate pain.    Marland Kitchen insulin glargine (LANTUS) 100 UNIT/ML injection Inject 80 Units into the skin at bedtime.     . Insulin Pen Needle (FIFTY50 PEN NEEDLES) 31G X 8 MM MISC     . isosorbide mononitrate (IMDUR) 60 MG 24 hr tablet Take 1 tablet (60 mg total) by mouth 2 (two) times daily. 180 tablet 3  . lisinopril (ZESTRIL) 5 MG tablet Take 1 tablet by mouth once daily 90 tablet 3  . LYRICA 100 MG capsule Take 100 mg by mouth 2 (two) times daily as needed. For neuropathic pain  2  . Melatonin 5 MG TABS Take 5 mg by mouth at bedtime.    . metFORMIN (GLUCOPHAGE) 1000 MG tablet Take 1,000 mg by mouth 2 (two) times daily with a meal.    . metoprolol tartrate (LOPRESSOR) 100 MG tablet Take 1 tablet (100 mg total) by mouth 2 (two) times daily. 180 tablet 3  . nitroGLYCERIN (NITROSTAT) 0.4 MG SL tablet PLACE 1 TABLET UNDER THE TONGUE EVERY 5 MINUTES AS NEEDED FOR CHEST PAIN 25 tablet 3  . Omega-3 Fatty Acids (FISH OIL PO) Take 520 mg by mouth daily.    Marland Kitchen OVER THE COUNTER MEDICATION Apply 1 application topically daily as needed (  nerve pain). Theraworx otc pain relief foam     . pantoprazole (PROTONIX) 40 MG tablet TAKE 1 TABLET BY MOUTH TWICE DAILY 180 tablet 3  . Probiotic Product (PROBIOTIC PO) Take 1 tablet by mouth daily.    Marland Kitchen pyridoxine (B-6) 100 MG tablet Take 100 mg by mouth daily.    Marland Kitchen tiZANidine (ZANAFLEX) 4 MG tablet Take 1 tablet by mouth at bedtime.   1  . triamcinolone cream (KENALOG) 0.1 % Apply 1 application topically daily as needed (lichen planus). With cetaphil     No current facility-administered medications on file prior to visit.

## 2019-04-20 DIAGNOSIS — Z23 Encounter for immunization: Secondary | ICD-10-CM | POA: Diagnosis not present

## 2019-04-23 DIAGNOSIS — E1129 Type 2 diabetes mellitus with other diabetic kidney complication: Secondary | ICD-10-CM | POA: Diagnosis not present

## 2019-04-23 DIAGNOSIS — E1139 Type 2 diabetes mellitus with other diabetic ophthalmic complication: Secondary | ICD-10-CM | POA: Diagnosis not present

## 2019-04-23 DIAGNOSIS — Z794 Long term (current) use of insulin: Secondary | ICD-10-CM | POA: Diagnosis not present

## 2019-04-23 DIAGNOSIS — E118 Type 2 diabetes mellitus with unspecified complications: Secondary | ICD-10-CM | POA: Diagnosis not present

## 2019-04-30 ENCOUNTER — Other Ambulatory Visit: Payer: Self-pay | Admitting: Cardiology

## 2019-05-01 ENCOUNTER — Other Ambulatory Visit: Payer: Self-pay | Admitting: Cardiology

## 2019-05-01 MED ORDER — METOPROLOL TARTRATE 100 MG PO TABS: 100.0000 mg | ORAL_TABLET | Freq: Two times a day (BID) | ORAL | 2 refills | Status: DC

## 2019-06-11 ENCOUNTER — Encounter: Payer: Self-pay | Admitting: Cardiology

## 2019-06-11 ENCOUNTER — Telehealth (INDEPENDENT_AMBULATORY_CARE_PROVIDER_SITE_OTHER): Payer: Medicare Other | Admitting: Cardiology

## 2019-06-11 ENCOUNTER — Other Ambulatory Visit: Payer: Self-pay

## 2019-06-11 VITALS — BP 116/66 | HR 73 | Ht 71.5 in | Wt 247.0 lb

## 2019-06-11 DIAGNOSIS — I2583 Coronary atherosclerosis due to lipid rich plaque: Secondary | ICD-10-CM

## 2019-06-11 DIAGNOSIS — I2510 Atherosclerotic heart disease of native coronary artery without angina pectoris: Secondary | ICD-10-CM | POA: Diagnosis not present

## 2019-06-11 DIAGNOSIS — I10 Essential (primary) hypertension: Secondary | ICD-10-CM

## 2019-06-11 DIAGNOSIS — I209 Angina pectoris, unspecified: Secondary | ICD-10-CM

## 2019-06-11 DIAGNOSIS — E7800 Pure hypercholesterolemia, unspecified: Secondary | ICD-10-CM

## 2019-06-11 DIAGNOSIS — I779 Disorder of arteries and arterioles, unspecified: Secondary | ICD-10-CM

## 2019-06-11 NOTE — Progress Notes (Signed)
Virtual Visit via Telephone Note   This visit type was conducted due to national recommendations for restrictions regarding the COVID-19 Pandemic (e.g. social distancing) in an effort to limit this patient's exposure and mitigate transmission in our community.  Due to his co-morbid illnesses, this patient is at least at moderate risk for complications without adequate follow up.  This format is felt to be most appropriate for this patient at this time.  The patient did not have access to video technology/had technical difficulties with video requiring transitioning to audio format only (telephone).  All issues noted in this document were discussed and addressed.  No physical exam could be performed with this format.  Please refer to the patient's chart for his  consent to telehealth for [facility].   Date:  06/11/2019   ID:  Seth Young, DOB Aug 06, 1941, MRN [id number]  Patient Location: Home Provider Location: Home  PCP:  Burnard Bunting, MD  Cardiologist:  Candee Furbish, MD  Electrophysiologist:  None   Evaluation Performed:  Follow-Up Visit  Chief Complaint: Coronary disease follow-up  History of Present Illness:    Seth Young is a 78 y.o. male here for anginal follow-up coronary artery disease.  Doing very well.  Every now and then may take a nitroglycerin to help him out but overall his isosorbide seems to be doing the trick.  Swimming when he can.  No fevers chills nausea vomiting syncope bleeding.  Left heart catheterization in 2019 showed diffuse triple-vessel CAD with successful DES placement to mid LAD.  PDA and diagonal branch were too small for stenting.  Chronic lesions.  These could cause angina to occur.  Had back surgery 2 years ago.  Try to get back into exercise.  The patient does not have symptoms concerning for COVID-19 infection (fever, chills, cough, or new shortness of breath).    Past Medical History:  Diagnosis Date  . Arthritis    "knees,  toes, hands, probably in my back" (03/09/2018)  . Ataxia   . Basal cell carcinoma    left temporal area-no residual  . Colon polyps 10-12-11   past hx.  . Coronary artery disease   . Exertional angina ([place]) 06/26/2014   Cath with DES to the OM, Bioflow protocol   . GERD (gastroesophageal reflux disease)   . H/O hiatal hernia    no problems  . High cholesterol   . History of echocardiogram    Echo 7/19:  Mild LVH, mild focal basal septal hypertrophy, EF 60-65, no RWMA, Gr 1 DD, trivial AI, MAC  . Hypertension   . Ischemic chest pain ([place]) 08/07/2014  . Occasional tremors    Bilateral hands  . Peripheral neuropathy   . Pneumonia 1940's X 2; ~ 2017   "infant; walking pneumonia" (03/09/2018)  . Pseudogout    "it moves around"  . Sleep apnea    no cpap used  . Type II diabetes mellitus ([place]) dx'd 1982   nsulin started ~ 2000  . Vertigo    Past Surgical History:  Procedure Laterality Date  . BACK SURGERY    . BASAL CELL CARCINOMA EXCISION Left ~ 2012   face  . CARDIAC CATHETERIZATION  02/2005   Dr. Mare Ferrari; negative.  Marland Kitchen CARDIAC CATHETERIZATION N/A 05/13/2016   Procedure: Left Heart Cath and Coronary Angiography;  Surgeon: Jerline Pain, MD;  Location: [place] [facility];  Service: Cardiovascular;  Laterality: N/A;  . CARPAL TUNNEL RELEASE Left 10/2009  . CARPAL TUNNEL RELEASE  Right ~ 2013  . CATARACT EXTRACTION W/ INTRAOCULAR LENS IMPLANT Left 2000's  . CATARACT EXTRACTION W/ INTRAOCULAR LENS IMPLANT Right 2015  . COLONOSCOPY W/ POLYPECTOMY    . CORONARY ANGIOPLASTY WITH STENT PLACEMENT  06/26/2014   "1"  . CORONARY ANGIOPLASTY WITH STENT PLACEMENT  06/26/2014   pLAD 50%, d LAD 60%, oD1 80%,  mD1  70%, CFX 50%, OM 2 70%,  RCA 80%, PDA 95% (<60m), OM1 99%-0% with Bio flow stent       . CORONARY ANGIOPLASTY WITH STENT PLACEMENT  03/09/2018  . CORONARY STENT INTERVENTION N/A 03/09/2018   Procedure: CORONARY STENT INTERVENTION;  Surgeon: MBurnell Blanks MD;  [facility];  Service: Cardiovascular;  Laterality: N/A;  . ELBOW SURGERY Bilateral ~ 2014   "for blockages; Dr. GAmedeo Plenty  . ENDARTERECTOMY Left 04/23/2015   Procedure: ENDARTERECTOMY CAROTID;  Surgeon: BConrad [place] MD;  [facility]  Service: Vascular;  Laterality: Left;  . EYE SURGERY Left    "related go diabetes; laser"  . KNEE ARTHROSCOPY Left 05/2011  . LEFT HEART CATH AND CORONARY ANGIOGRAPHY N/A 03/09/2018   Procedure: LEFT HEART CATH AND CORONARY ANGIOGRAPHY;  Surgeon: MBurnell Blanks MD;  [facility];  Service: Cardiovascular;  Laterality: N/A;  . LEFT HEART CATHETERIZATION WITH CORONARY ANGIOGRAM N/A 06/26/2014   Procedure: LEFT HEART CATHETERIZATION WITH CORONARY ANGIOGRAM;  Surgeon: MBlane Ohara MD;  [facility];  Service: Cardiovascular;  Laterality: N/A;  . LEFT HEART CATHETERIZATION WITH CORONARY ANGIOGRAM N/A 08/07/2014   Procedure: LEFT HEART CATHETERIZATION WITH CORONARY ANGIOGRAM;  Surgeon: MBlane Ohara MD;  [facility];  Service: Cardiovascular;  Laterality: N/A;  . LUMBAR LAMINECTOMY/DECOMPRESSION MICRODISCECTOMY Left 11/02/2017   Procedure: MICRODISCECTOMY LUMBAR THREE- LUMBAR FOUR, LEFT;  Surgeon: CAshok Pall MD;  [place]  Service: Neurosurgery;  Laterality: Left;  . NASAL SEPTUM SURGERY  1979  . PATCH ANGIOPLASTY Left 04/23/2015   Procedure: PATCH ANGIOPLASTY USING 1CM X 6CM XENOSURE BIOLOGIC PATCH;  Surgeon: BConrad [place] MD;  [place] [facility]  Service: Vascular;  Laterality: Left;  . [place] SHOULDER OPEN ROTATOR CUFF REPAIR  10/14/2011   Procedure: ROTATOR CUFF REPAIR SHOULDER OPEN;  Surgeon: JJohnn Hai MD;  [facility];  Service: Orthopedics;  Laterality: Right;  . SUBACROMIAL DECOMPRESSION  10/14/2011   Procedure: SUBACROMIAL DECOMPRESSION;  Surgeon: JJohnn Hai MD;  Location: [facility];  Service: Orthopedics;  Laterality: Right;  . TRIGGER FINGER RELEASE Right  ~ 2013-2014   "3rd & 4th digits"     Current Meds  Medication Sig  . acetaminophen (TYLENOL) 325 MG tablet Take 650 mg by mouth every 6 (six) hours as needed for moderate pain or headache.   .Marland Kitchenalum & mag hydroxide-simeth (MAALOX/MYLANTA) 200-200-20 MG/5ML suspension Take 15 mLs by mouth every 6 (six) hours as needed for indigestion or heartburn.  .Marland Kitchenaspirin 81 MG tablet Take 1 tablet (81 mg total) by mouth daily.  .Marland Kitchenatorvastatin (LIPITOR) 20 MG tablet Take 20 mg by mouth daily.  . chlorhexidine (PERIDEX) 0.12 % solution SWISH WITH 1-2 OUNCE FOR 30 SECONDS THEN SPIT TWICE DAILY as needed for flare up  . clopidogrel (PLAVIX) 75 MG tablet Take 1 tablet by mouth once daily  . colchicine 0.6 MG tablet Take 0.6 mg by mouth daily as needed (gout).   . Cyanocobalamin (B-12) 5000 MCG CAPS Take 5,000 mcg by mouth daily.  .Marland Kitchen  dorzolamide-timolol (COSOPT) 22.3-6.8 MG/ML ophthalmic solution Place 1 drop into both eyes 2 (two) times daily.  . empagliflozin (JARDIANCE) 25 MG TABS tablet Take 25 mg by mouth daily.  Marland Kitchen guaiFENesin (MUCINEX) 600 MG 12 hr tablet Take 600 mg by mouth 2 (two) times daily as needed for cough.  Marland Kitchen HUMALOG KWIKPEN 200 UNIT/ML SOPN Inject 10-22 Units as directed 3 (three) times daily. SLIDING SCALE  . ibuprofen (ADVIL,MOTRIN) 200 MG tablet Take 400 mg by mouth every 6 (six) hours as needed for moderate pain.  Marland Kitchen insulin glargine (LANTUS) 100 UNIT/ML injection Inject 80 Units into the skin at bedtime.   . Insulin Pen Needle (FIFTY50 PEN NEEDLES) 31G X 8 MM MISC   . isosorbide mononitrate (IMDUR) 60 MG 24 hr tablet Take 1 tablet (60 mg total) by mouth 2 (two) times daily.  Marland Kitchen lisinopril (ZESTRIL) 5 MG tablet Take 1 tablet by mouth once daily  . LYRICA 100 MG capsule Take 100 mg by mouth 2 (two) times daily as needed. For neuropathic pain  . Melatonin 5 MG TABS Take 5 mg by mouth at bedtime.  . metFORMIN (GLUCOPHAGE) 1000 MG tablet Take 1,000 mg by mouth 2 (two) times daily with a meal.  .  metoprolol tartrate (LOPRESSOR) 100 MG tablet Take 1 tablet (100 mg total) by mouth 2 (two) times daily.  . nitroGLYCERIN (NITROSTAT) 0.4 MG SL tablet PLACE 1 TABLET UNDER THE TONGUE EVERY 5 MINUTES AS NEEDED FOR CHEST PAIN  . Omega-3 Fatty Acids (FISH OIL PO) Take 520 mg by mouth daily.  Marland Kitchen OVER THE COUNTER MEDICATION Apply 1 application topically daily as needed (nerve pain). Theraworx otc pain relief foam   . pantoprazole (PROTONIX) 40 MG tablet TAKE 1 TABLET BY MOUTH TWICE DAILY  . Probiotic Product (PROBIOTIC PO) Take 1 tablet by mouth daily.  Marland Kitchen pyridoxine (B-6) 100 MG tablet Take 100 mg by mouth daily.  Marland Kitchen tiZANidine (ZANAFLEX) 4 MG tablet Take 1 tablet by mouth at bedtime.   . triamcinolone cream (KENALOG) 0.1 % Apply 1 application topically daily as needed (lichen planus). With cetaphil     Allergies:   Gabapentin, Morphine and related, and Naproxen   Social History   Tobacco Use  . Smoking status: Former Smoker    Packs/day: 0.50    Years: 20.00    Pack years: 10.00    Types: Cigarettes    Quit date: 10/11/1988    Years since quitting: 30.6  . Smokeless tobacco: Never Used  Substance Use Topics  . Alcohol use: No    Alcohol/week: 0.0 standard drinks    Comment: Quit drinking in 1990  . Drug use: No     Family Hx: The patient's family history includes Cancer in his sister; Diabetes in his brother, mother, and sister; Heart attack (age of onset: 90) in his father; Hypertension in his brother and sister; Varicose Veins in his mother and sister.  ROS:   Please see the history of present illness.    No fevers chills nausea vomiting syncope bleeding All other systems reviewed and are negative.   Prior CV studies:   The following studies were reviewed today:  Cardiac catheterization 2019-LAD mid stent  Labs/Other Tests and Data Reviewed:    EKG:  03/10/2018-sinus rhythm first-degree AV block no other abnormalities  Recent Labs: No results found for requested labs within  last 8760 hours.   Recent Lipid Panel No results found for: CHOL, TRIG, HDL, CHOLHDL, LDLCALC, LDLDIRECT  Wt Readings from Last  3 Encounters:  06/11/19 247 lb (112 kg)  12/13/18 254 lb (115.2 kg)  04/27/18 247 lb 1.9 oz (112.1 kg)     Objective:    Vital Signs:  BP 116/66   Pulse 73   Ht 5' 11.5" (1.816 m)   Wt 247 lb (112 kg)   BMI 33.97 kg/m    VITAL SIGNS:  reviewed able to complete full sentences without difficulties  ASSESSMENT & PLAN:    Coronary artery disease/angina - Prior stent to mid LAD 03/09/2018 with diagonal and obtuse marginal branch too small for stenting.  Isosorbide utilized as well as metoprolol.  See above.  Increased isosorbide to 60 mg twice daily previously.  Seems to doing very well with this regimen.  No changes made.  Essential hypertension - Overall well controlled with current medications.  Excellent control.  Carotid endarterectomy/carotid artery disease - Endarterectomy in 2013.  No TIA.  Doing well.  Hyperlipidemia -Continue with atorvastatin, LDL 65.  No changes no myalgias  Diabetes with hypertension and insulin use -Prior hemoglobin A1c 6.4.  Lisinopril for renal protection. Feels good.  COVID-19 Education: The signs and symptoms of COVID-19 were discussed with the patient and how to seek care for testing (follow up with PCP or arrange E-visit).  The importance of social distancing was discussed today.  Time:   Today, I have spent 12 minutes with the patient with telehealth technology discussing the above problems.     Medication Adjustments/Labs and Tests Ordered: Current medicines are reviewed at length with the patient today.  Concerns regarding medicines are outlined above.   Tests Ordered: No orders of the defined types were placed in this encounter.   Medication Changes: No orders of the defined types were placed in this encounter.   Follow Up:  Either In Person or Virtual in 6 month(s)  Signed, Candee Furbish, MD   06/11/2019 3:14 PM    [place]

## 2019-06-11 NOTE — Patient Instructions (Addendum)
Medication Instructions:  The current medical regimen is effective;  continue present plan and medications.  *If you need a refill on your cardiac medications before your next appointment, please call your pharmacy*  Follow-Up: At [facility], you and your health needs are our priority.  As part of our continuing mission to provide you with exceptional heart care, we have created designated Provider Care Teams.  These Care Teams include your primary Cardiologist (physician) and Advanced Practice Providers (APPs -  Physician Assistants and Nurse Practitioners) who all work together to provide you with the care you need, when you need it.  Your next appointment:   6 month(s)  The format for your next appointment:   In Person  Provider:   You may see Mark Skains, MD or one of the following Advanced Practice Providers on your designated Care Team:    Lori Gerhardt, NP  Laura Ingold, NP  Jill McDaniel, NP   Thank you for choosing [facility]!!      

## 2019-06-22 ENCOUNTER — Other Ambulatory Visit: Payer: Self-pay | Admitting: Cardiology

## 2019-06-22 DIAGNOSIS — R079 Chest pain, unspecified: Secondary | ICD-10-CM

## 2019-08-08 DIAGNOSIS — E118 Type 2 diabetes mellitus with unspecified complications: Secondary | ICD-10-CM | POA: Diagnosis not present

## 2019-08-08 DIAGNOSIS — E782 Mixed hyperlipidemia: Secondary | ICD-10-CM | POA: Diagnosis not present

## 2019-08-08 DIAGNOSIS — Z125 Encounter for screening for malignant neoplasm of prostate: Secondary | ICD-10-CM | POA: Diagnosis not present

## 2019-08-08 DIAGNOSIS — I10 Essential (primary) hypertension: Secondary | ICD-10-CM | POA: Diagnosis not present

## 2019-09-24 ENCOUNTER — Other Ambulatory Visit: Payer: Self-pay | Admitting: Medical

## 2019-09-24 DIAGNOSIS — R079 Chest pain, unspecified: Secondary | ICD-10-CM

## 2019-10-07 ENCOUNTER — Ambulatory Visit: Payer: Medicare Other | Attending: Internal Medicine

## 2019-10-07 DIAGNOSIS — Z23 Encounter for immunization: Secondary | ICD-10-CM | POA: Insufficient documentation

## 2019-10-07 NOTE — Progress Notes (Signed)
   Covid-19 Vaccination Clinic  Name:  Seth Young    MRN: [id number] DOB: 1940-12-08  10/07/2019  Seth Young was observed post Covid-19 immunization for 15 minutes without incidence. He was provided with Vaccine Information Sheet and instruction to access the V-Safe system.   Seth Young was instructed to call 911 with any severe reactions post vaccine: Marland Kitchen Difficulty breathing  . Swelling of your face and throat  . A fast heartbeat  . A bad rash all over your body  . Dizziness and weakness    Immunizations Administered    Name Date Dose VIS Date Route   Pfizer COVID-19 Vaccine 10/07/2019 10:39 AM 0.3 mL 07/20/2019 Intramuscular   Manufacturer: [place]   Lot: [id number]   [place]: [id number]

## 2019-10-31 ENCOUNTER — Ambulatory Visit: Payer: Medicare Other | Attending: Internal Medicine

## 2019-10-31 DIAGNOSIS — Z23 Encounter for immunization: Secondary | ICD-10-CM

## 2019-10-31 NOTE — Progress Notes (Signed)
   Covid-19 Vaccination Clinic  Name:  Seth Young    MRN: [id number] DOB: [birth year]  10/31/2019  Seth Young was observed post Covid-19 immunization for 15 minutes without incident. He was provided with Vaccine Information Sheet and instruction to access the V-Safe system.   Seth Young was instructed to call 911 with any severe reactions post vaccine: Marland Kitchen Difficulty breathing  . Swelling of face and throat  . A fast heartbeat  . A bad rash all over body  . Dizziness and weakness   Immunizations Administered    Name Date Dose VIS Date Route   Pfizer COVID-19 Vaccine 10/31/2019  2:04 PM 0.3 mL 07/20/2019 Intramuscular   Manufacturer: [place]   Lot: [id number]   [place]: [id number]

## 2019-11-14 DIAGNOSIS — L433 Subacute (active) lichen planus: Secondary | ICD-10-CM | POA: Diagnosis not present

## 2019-11-14 DIAGNOSIS — L308 Other specified dermatitis: Secondary | ICD-10-CM | POA: Diagnosis not present

## 2019-11-21 DIAGNOSIS — Z794 Long term (current) use of insulin: Secondary | ICD-10-CM | POA: Diagnosis not present

## 2019-11-21 DIAGNOSIS — E118 Type 2 diabetes mellitus with unspecified complications: Secondary | ICD-10-CM | POA: Diagnosis not present

## 2019-11-21 DIAGNOSIS — E1139 Type 2 diabetes mellitus with other diabetic ophthalmic complication: Secondary | ICD-10-CM | POA: Diagnosis not present

## 2019-11-21 DIAGNOSIS — E1129 Type 2 diabetes mellitus with other diabetic kidney complication: Secondary | ICD-10-CM | POA: Diagnosis not present

## 2019-11-23 DIAGNOSIS — E1129 Type 2 diabetes mellitus with other diabetic kidney complication: Secondary | ICD-10-CM | POA: Diagnosis not present

## 2019-11-30 ENCOUNTER — Encounter: Payer: Self-pay | Admitting: Cardiology

## 2019-11-30 ENCOUNTER — Ambulatory Visit: Payer: Medicare Other | Admitting: Cardiology

## 2019-11-30 VITALS — BP 120/62 | HR 69 | Ht 71.5 in | Wt 257.0 lb

## 2019-11-30 DIAGNOSIS — I2510 Atherosclerotic heart disease of native coronary artery without angina pectoris: Secondary | ICD-10-CM

## 2019-11-30 DIAGNOSIS — E785 Hyperlipidemia, unspecified: Secondary | ICD-10-CM

## 2019-11-30 DIAGNOSIS — I209 Angina pectoris, unspecified: Secondary | ICD-10-CM | POA: Diagnosis not present

## 2019-11-30 DIAGNOSIS — I2583 Coronary atherosclerosis due to lipid rich plaque: Secondary | ICD-10-CM | POA: Diagnosis not present

## 2019-11-30 NOTE — Progress Notes (Signed)
Cardiology Office Note:    Date:  11/30/2019   ID:  Seth Young, DOB 1941-03-08, MRN [id number]  PCP:  Burnard Bunting, MD  Cardiologist:  Candee Furbish, MD  Electrophysiologist:  None   Referring MD: Burnard Bunting, MD     History of Present Illness:    Seth Young is a 79 y.o. male here for follow-up of coronary artery disease.  chills nausea vomiting syncope bleeding.  Left heart catheterization in 2019 showed diffuse triple-vessel CAD with successful DES placement to mid LAD.  PDA and diagonal branch were too small for stenting.  Chronic lesions.  These could cause angina to occur.  Had back surgery 3 years ago.  Try to get back into exercise.  Hears a wheeze in chest, eyes itch, sneeze. Mild SOB with activity.  Overall no real chest pain.  Wants to get back into swimming.   Past Medical History:  Diagnosis Date  . Arthritis    "knees, toes, hands, probably in my back" (03/09/2018)  . Ataxia   . Basal cell carcinoma    left temporal area-no residual  . Colon polyps 10-12-11   past hx.  . Coronary artery disease   . Exertional angina (Seth Young) 06/26/2014   Cath with DES to the OM, Bioflow protocol   . GERD (gastroesophageal reflux disease)   . H/O hiatal hernia    no problems  . High cholesterol   . History of echocardiogram    Echo 7/19:  Mild LVH, mild focal basal septal hypertrophy, EF 60-65, no RWMA, Gr 1 DD, trivial AI, MAC  . Hypertension   . Ischemic chest pain ([place]) 08/07/2014  . Occasional tremors    Bilateral hands  . Peripheral neuropathy   . Pneumonia 1940's X 2; ~ 2017   "infant; walking pneumonia" (03/09/2018)  . Pseudogout    "it moves around"  . Sleep apnea    no cpap used  . Type II diabetes mellitus (Seth Young) dx'd 1982   nsulin started ~ 2000  . Vertigo     Past Surgical History:  Procedure Laterality Date  . BACK SURGERY    . BASAL CELL CARCINOMA EXCISION Left ~ 2012   face  . CARDIAC CATHETERIZATION  02/2005   Dr. Mare Ferrari;  negative.  Marland Kitchen CARDIAC CATHETERIZATION N/A 05/13/2016   Procedure: Left Heart Cath and Coronary Angiography;  Surgeon: Jerline Pain, MD;  Location: [facility];  Service: Cardiovascular;  Laterality: N/A;  . CARPAL TUNNEL RELEASE Left 10/2009  . CARPAL TUNNEL RELEASE Right ~ 2013  . CATARACT EXTRACTION W/ INTRAOCULAR LENS IMPLANT Left 2000's  . CATARACT EXTRACTION W/ INTRAOCULAR LENS IMPLANT Right 2015  . COLONOSCOPY W/ POLYPECTOMY    . CORONARY ANGIOPLASTY WITH STENT PLACEMENT  06/26/2014   "1"  . CORONARY ANGIOPLASTY WITH STENT PLACEMENT  06/26/2014   pLAD 50%, d LAD 60%, oD1 80%,  mD1  70%, CFX 50%, OM 2 70%,  RCA 80%, PDA 95% (<63m), OM1 99%-0% with Bio flow stent       . CORONARY ANGIOPLASTY WITH STENT PLACEMENT  03/09/2018  . CORONARY STENT INTERVENTION N/A 03/09/2018   Procedure: CORONARY STENT INTERVENTION;  Surgeon: MBurnell Blanks MD;  [facility];  Service: Cardiovascular;  Laterality: N/A;  . ELBOW SURGERY Bilateral ~ 2014   "for blockages; Dr. GAmedeo Plenty  . ENDARTERECTOMY Left 04/23/2015   Procedure: ENDARTERECTOMY CAROTID;  Surgeon: BConrad Parlier MD;  [place] [facility]  Service: Vascular;  Laterality: Left;  .  EYE SURGERY Left    "related go diabetes; laser"  . KNEE ARTHROSCOPY Left 05/2011  . LEFT HEART CATH AND CORONARY ANGIOGRAPHY N/A 03/09/2018   Procedure: LEFT HEART CATH AND CORONARY ANGIOGRAPHY;  Surgeon: Burnell Blanks, MD;  Location: [place] [facility];  Service: Cardiovascular;  Laterality: N/A;  . LEFT HEART CATHETERIZATION WITH CORONARY ANGIOGRAM N/A 06/26/2014   Procedure: LEFT HEART CATHETERIZATION WITH CORONARY ANGIOGRAM;  Surgeon: Blane Ohara, MD;  [facility];  Service: Cardiovascular;  Laterality: N/A;  . LEFT HEART CATHETERIZATION WITH CORONARY ANGIOGRAM N/A 08/07/2014   Procedure: LEFT HEART CATHETERIZATION WITH CORONARY ANGIOGRAM;  Surgeon: Blane Ohara, MD;  Location: [facility] CATH LAB;  Service: Cardiovascular;   Laterality: N/A;  . LUMBAR LAMINECTOMY/DECOMPRESSION MICRODISCECTOMY Left 11/02/2017   Procedure: MICRODISCECTOMY LUMBAR THREE- LUMBAR FOUR, LEFT;  Surgeon: Ashok Pall, MD;  Location: [place];  Service: Neurosurgery;  Laterality: Left;  . NASAL SEPTUM SURGERY  1979  . PATCH ANGIOPLASTY Left 04/23/2015   Procedure: PATCH ANGIOPLASTY USING 1CM X 6CM XENOSURE BIOLOGIC PATCH;  Surgeon: Conrad Los Arcos, MD;  Location: [place];  Service: Vascular;  Laterality: Left;  . [place]  . SHOULDER OPEN ROTATOR CUFF REPAIR  10/14/2011   Procedure: ROTATOR CUFF REPAIR SHOULDER OPEN;  Surgeon: Johnn Hai, MD;  Location: [facility];  Service: Orthopedics;  Laterality: Right;  . SUBACROMIAL DECOMPRESSION  10/14/2011   Procedure: SUBACROMIAL DECOMPRESSION;  Surgeon: Johnn Hai, MD;  Location: [facility];  Service: Orthopedics;  Laterality: Right;  . TRIGGER FINGER RELEASE Right ~ 2013-2014   "3rd & 4th digits"    Current Medications: Current Meds  Medication Sig  . acetaminophen (TYLENOL) 325 MG tablet Take 650 mg by mouth every 6 (six) hours as needed for moderate pain or headache.   Marland Kitchen aspirin 81 MG tablet Take 1 tablet (81 mg total) by mouth daily.  Marland Kitchen atorvastatin (LIPITOR) 20 MG tablet Take 20 mg by mouth daily.  . chlorhexidine (PERIDEX) 0.12 % solution SWISH WITH 1-2 OUNCE FOR 30 SECONDS THEN SPIT TWICE DAILY as needed for flare up  . clopidogrel (PLAVIX) 75 MG tablet Take 1 tablet by mouth once daily  . colchicine 0.6 MG tablet Take 0.6 mg by mouth daily as needed (gout).   . Cyanocobalamin (B-12) 5000 MCG CAPS Take 5,000 mcg by mouth daily.  . dorzolamide-timolol (COSOPT) 22.3-6.8 MG/ML ophthalmic solution Place 1 drop into both eyes 2 (two) times daily.  . empagliflozin (JARDIANCE) 25 MG TABS tablet Take 25 mg by mouth daily.  Marland Kitchen guaiFENesin (MUCINEX) 600 MG 12 hr tablet Take 600 mg by mouth 2 (two) times daily as needed for cough.  Marland Kitchen HUMALOG KWIKPEN 200 UNIT/ML SOPN Inject 10-22 Units  as directed 3 (three) times daily. SLIDING SCALE  . ibuprofen (ADVIL,MOTRIN) 200 MG tablet Take 400 mg by mouth every 6 (six) hours as needed for moderate pain.  Marland Kitchen insulin glargine (LANTUS) 100 UNIT/ML injection Inject 80 Units into the skin at bedtime.   . Insulin Pen Needle (FIFTY50 PEN NEEDLES) 31G X 8 MM MISC   . isosorbide mononitrate (IMDUR) 60 MG 24 hr tablet Take 1 tablet by mouth twice daily  . lisinopril (ZESTRIL) 5 MG tablet Take 1 tablet by mouth once daily  . LYRICA 100 MG capsule Take 100 mg by mouth 2 (two) times daily as needed. For neuropathic pain  . Melatonin 5 MG TABS Take 5 mg by mouth at bedtime.  . metFORMIN (GLUCOPHAGE)  1000 MG tablet Take 1,000 mg by mouth 2 (two) times daily with a meal.  . metoprolol tartrate (LOPRESSOR) 100 MG tablet Take 1 tablet (100 mg total) by mouth 2 (two) times daily.  . nitroGLYCERIN (NITROSTAT) 0.4 MG SL tablet PLACE 1 TABLET UNDER THE TONGUE EVERY 5 MINUTES AS NEEDED FOR CHEST PAIN  . Omega-3 Fatty Acids (FISH OIL PO) Take 520 mg by mouth daily.  Marland Kitchen OVER THE COUNTER MEDICATION Apply 1 application topically daily as needed (nerve pain). Theraworx otc pain relief foam   . pantoprazole (PROTONIX) 40 MG tablet Take 1 tablet by mouth twice daily  . Probiotic Product (PROBIOTIC PO) Take 1 tablet by mouth daily.  Marland Kitchen pyridoxine (B-6) 100 MG tablet Take 100 mg by mouth daily.  Marland Kitchen tiZANidine (ZANAFLEX) 4 MG tablet Take 1 tablet by mouth at bedtime.   . triamcinolone cream (KENALOG) 0.1 % Apply 1 application topically daily as needed (lichen planus). With cetaphil     Allergies:   Gabapentin, Morphine and related, and Naproxen   Social History   Socioeconomic History  . Marital status: Married    Spouse name: Not on file  . Number of children: 5  . Years of education: College  . Highest education level: Not on file  Occupational History  . Not on file  Tobacco Use  . Smoking status: Former Smoker    Packs/day: 0.50    Years: 20.00    Pack  years: 10.00    Types: Cigarettes    Quit date: 10/11/1988    Years since quitting: 31.1  . Smokeless tobacco: Never Used  Substance and Sexual Activity  . Alcohol use: No    Alcohol/week: 0.0 standard drinks    Comment: Quit drinking in 1990  . Drug use: No  . Sexual activity: Not Currently  Other Topics Concern  . Not on file  Social History Narrative   Drinks about 2 cups of coffee a day, occasional diet pepsi.    Social Determinants of Health   Financial Resource Strain:   . Difficulty of Paying Living Expenses:   Food Insecurity:   . Worried About Charity fundraiser in the Last Year:   . Arboriculturist in the Last Year:   Transportation Needs:   . Film/video editor (Medical):   Marland Kitchen Lack of Transportation (Non-Medical):   Physical Activity:   . Days of Exercise per Week:   . Minutes of Exercise per Session:   Stress:   . Feeling of Stress :   Social Connections:   . Frequency of Communication with Friends and Family:   . Frequency of Social Gatherings with Friends and Family:   . Attends Religious Services:   . Active Member of Clubs or Organizations:   . Attends Archivist Meetings:   Marland Kitchen Marital Status:      Family History: The patient's family history includes Cancer in his sister; Diabetes in his brother, mother, and sister; Heart attack (age of onset: 85) in his father; Hypertension in his brother and sister; Varicose Veins in his mother and sister.  ROS:   Please see the history of present illness.     All other systems reviewed and are negative.  EKGs/Labs/Other Studies Reviewed:    The following studies were reviewed today:  Cath 2019 1. Diffuse triple vessel CAD 2. The LAD is a large caliber vessel that courses to the apex. The proximal LAD has diffuse moderate disease but no focally obstructive lesions.  The mid LAD has an eccentric, focal, hazy stenosis. The distal LAD has mild diffuse disease. The first diagonal branch is a small caliber  vessel with diffuse severe disease, unchanged from prior cath and not large enough for PCI.  3. The Circumflex has moderate proximal and mid stenosis. The obtuse marginal stent is patent without restenosis. The moderate to large caliber intermediate branch has moderate proximal stenosis.  4. The RCA is small caliber, non-dominant vessel with diffuse severe stenosis in the small PDA and in the RV marginal branch, unchanged from last cath and too small for PCI.  5. Successful PTCA/DES x 1 mid LAD.   Recommendations: The PDA and the Diagonal are too small for stenting. Both of these branches have chronic lesions and are unchanged since the last cath in 2017. Either branch could cause angina. The LAD was stented today in a hazy mid stenosis. Hopefully this will relieve some of his angina.   EKG: Sinus rhythm with first-degree AV block prior EKG  Recent Labs: No results found for requested labs within last 8760 hours.  Recent Lipid Panel No results found for: CHOL, TRIG, HDL, CHOLHDL, VLDL, LDLCALC, LDLDIRECT  Physical Exam:    VS:  BP 120/62   Pulse 69   Ht 5' 11.5" (1.816 m)   Wt 257 lb (116.6 kg)   SpO2 99%   BMI 35.34 kg/m     Wt Readings from Last 3 Encounters:  11/30/19 257 lb (116.6 kg)  06/11/19 247 lb (112 kg)  12/13/18 254 lb (115.2 kg)     GEN:  Well nourished, well developed in no acute distress HEENT: Normal NECK: No JVD; No carotid bruits LYMPHATICS: No lymphadenopathy CARDIAC: RRR, no murmurs, rubs, gallops RESPIRATORY:  Clear to auscultation without rales, wheezing or rhonchi  ABDOMEN: Soft, non-tender, non-distended MUSCULOSKELETAL:  No edema; No deformity  SKIN: Warm and dry NEUROLOGIC:  Alert and oriented x 3 PSYCHIATRIC:  Normal affect   ASSESSMENT:    1. Coronary artery disease due to lipid rich plaque   2. Angina pectoris ([place])   3. Hyperlipidemia, unspecified hyperlipidemia type    PLAN:    In order of problems listed above:  Coronary artery  disease/angina -Prior stent to mid LAD 03/09/2018 with diagonal and obtuse marginal branch too small for stenting. Isosorbide utilized as well as metoprolol. See above. Increased isosorbide to 60 mg twice daily previously.  Seems to doing very well with this regimen.  No changes made.  Rare nitroglycerin use.  I would not suggest utilizing nitroglycerin prior to swimming.  Essential hypertension -Overall well controlled with current medications.  Excellent control.  No changes made again today.  Carotid endarterectomy/carotid artery disease -Endarterectomy in 2013. No TIA.  Doing well.  Stable.  Hyperlipidemia -Continue with atorvastatin, LDL 62.  No changes no myalgias.  Hemoglobin 13.9 creatinine 1.3  Diabetes with hypertension and insulin use -Prior hemoglobin A1c 6.4. Lisinopril for renal protection.  Seasonal allergies -Suggested taking a Mucinex periodically.  He also is taking Claritin.  He knows to avoid decongestants.  I think this is probably his eyes itchy runny nose and occasional scattered wheeze.  Medication Adjustments/Labs and Tests Ordered: Current medicines are reviewed at length with the patient today.  Concerns regarding medicines are outlined above.  No orders of the defined types were placed in this encounter.  No orders of the defined types were placed in this encounter.   Patient Instructions  Medication Instructions:  The current medical regimen is effective;  continue present plan and medications.  *If you need a refill on your cardiac medications before your next appointment, please call your pharmacy*  Follow-Up: At [facility], you and your health needs are our priority.  As part of our continuing mission to provide you with exceptional heart care, we have created designated Provider Care Teams.  These Care Teams include your primary Cardiologist (physician) and Advanced Practice Providers (APPs -  Physician Assistants and Nurse Practitioners)  who all work together to provide you with the care you need, when you need it.  We recommend signing up for the patient portal called "MyChart".  Sign up information is provided on this After Visit Summary.  MyChart is used to connect with patients for Virtual Visits (Telemedicine).  Patients are able to view lab/test results, encounter notes, upcoming appointments, etc.  Non-urgent messages can be sent to your provider as well.   To learn more about what you can do with MyChart, go to [email or web address].    Your next appointment:   6 month(s)  The format for your next appointment:   In Person  Provider:   Candee Furbish, MD   Thank you for choosing [facility]!!        Signed, Candee Furbish, MD  11/30/2019 11:44 AM    [place]

## 2019-11-30 NOTE — Patient Instructions (Signed)

## 2019-12-20 ENCOUNTER — Other Ambulatory Visit: Payer: Self-pay | Admitting: Cardiology

## 2020-01-24 ENCOUNTER — Other Ambulatory Visit: Payer: Self-pay | Admitting: Cardiology

## 2020-02-08 DIAGNOSIS — R42 Dizziness and giddiness: Secondary | ICD-10-CM | POA: Diagnosis not present

## 2020-02-08 DIAGNOSIS — M545 Low back pain: Secondary | ICD-10-CM | POA: Diagnosis not present

## 2020-02-16 ENCOUNTER — Other Ambulatory Visit: Payer: Self-pay | Admitting: Cardiology

## 2020-02-22 DIAGNOSIS — R42 Dizziness and giddiness: Secondary | ICD-10-CM | POA: Diagnosis not present

## 2020-02-22 DIAGNOSIS — M545 Low back pain: Secondary | ICD-10-CM | POA: Diagnosis not present

## 2020-02-25 DIAGNOSIS — R42 Dizziness and giddiness: Secondary | ICD-10-CM | POA: Diagnosis not present

## 2020-02-25 DIAGNOSIS — M545 Low back pain: Secondary | ICD-10-CM | POA: Diagnosis not present

## 2020-02-28 DIAGNOSIS — M545 Low back pain: Secondary | ICD-10-CM | POA: Diagnosis not present

## 2020-02-28 DIAGNOSIS — R42 Dizziness and giddiness: Secondary | ICD-10-CM | POA: Diagnosis not present

## 2020-03-03 DIAGNOSIS — M545 Low back pain: Secondary | ICD-10-CM | POA: Diagnosis not present

## 2020-03-03 DIAGNOSIS — R42 Dizziness and giddiness: Secondary | ICD-10-CM | POA: Diagnosis not present

## 2020-03-06 DIAGNOSIS — M545 Low back pain: Secondary | ICD-10-CM | POA: Diagnosis not present

## 2020-03-06 DIAGNOSIS — R42 Dizziness and giddiness: Secondary | ICD-10-CM | POA: Diagnosis not present

## 2020-03-10 DIAGNOSIS — R42 Dizziness and giddiness: Secondary | ICD-10-CM | POA: Diagnosis not present

## 2020-03-10 DIAGNOSIS — M545 Low back pain: Secondary | ICD-10-CM | POA: Diagnosis not present

## 2020-03-13 DIAGNOSIS — R42 Dizziness and giddiness: Secondary | ICD-10-CM | POA: Diagnosis not present

## 2020-03-13 DIAGNOSIS — M545 Low back pain: Secondary | ICD-10-CM | POA: Diagnosis not present

## 2020-03-17 DIAGNOSIS — M545 Low back pain: Secondary | ICD-10-CM | POA: Diagnosis not present

## 2020-03-17 DIAGNOSIS — R42 Dizziness and giddiness: Secondary | ICD-10-CM | POA: Diagnosis not present

## 2020-03-20 DIAGNOSIS — M545 Low back pain: Secondary | ICD-10-CM | POA: Diagnosis not present

## 2020-03-20 DIAGNOSIS — E1139 Type 2 diabetes mellitus with other diabetic ophthalmic complication: Secondary | ICD-10-CM | POA: Diagnosis not present

## 2020-03-20 DIAGNOSIS — I10 Essential (primary) hypertension: Secondary | ICD-10-CM | POA: Diagnosis not present

## 2020-03-20 DIAGNOSIS — M1990 Unspecified osteoarthritis, unspecified site: Secondary | ICD-10-CM | POA: Diagnosis not present

## 2020-03-24 DIAGNOSIS — R42 Dizziness and giddiness: Secondary | ICD-10-CM | POA: Diagnosis not present

## 2020-03-24 DIAGNOSIS — M545 Low back pain: Secondary | ICD-10-CM | POA: Diagnosis not present

## 2020-03-26 DIAGNOSIS — M545 Low back pain: Secondary | ICD-10-CM | POA: Diagnosis not present

## 2020-03-26 DIAGNOSIS — R42 Dizziness and giddiness: Secondary | ICD-10-CM | POA: Diagnosis not present

## 2020-04-01 DIAGNOSIS — M545 Low back pain: Secondary | ICD-10-CM | POA: Diagnosis not present

## 2020-04-01 DIAGNOSIS — R42 Dizziness and giddiness: Secondary | ICD-10-CM | POA: Diagnosis not present

## 2020-04-03 DIAGNOSIS — M545 Low back pain: Secondary | ICD-10-CM | POA: Diagnosis not present

## 2020-04-03 DIAGNOSIS — R42 Dizziness and giddiness: Secondary | ICD-10-CM | POA: Diagnosis not present

## 2020-04-08 DIAGNOSIS — M545 Low back pain: Secondary | ICD-10-CM | POA: Diagnosis not present

## 2020-04-08 DIAGNOSIS — R42 Dizziness and giddiness: Secondary | ICD-10-CM | POA: Diagnosis not present

## 2020-04-10 DIAGNOSIS — R42 Dizziness and giddiness: Secondary | ICD-10-CM | POA: Diagnosis not present

## 2020-04-10 DIAGNOSIS — M545 Low back pain: Secondary | ICD-10-CM | POA: Diagnosis not present

## 2020-04-15 DIAGNOSIS — M545 Low back pain: Secondary | ICD-10-CM | POA: Diagnosis not present

## 2020-04-15 DIAGNOSIS — R42 Dizziness and giddiness: Secondary | ICD-10-CM | POA: Diagnosis not present

## 2020-04-17 DIAGNOSIS — M545 Low back pain: Secondary | ICD-10-CM | POA: Diagnosis not present

## 2020-04-17 DIAGNOSIS — R42 Dizziness and giddiness: Secondary | ICD-10-CM | POA: Diagnosis not present

## 2020-04-21 DIAGNOSIS — R42 Dizziness and giddiness: Secondary | ICD-10-CM | POA: Diagnosis not present

## 2020-04-21 DIAGNOSIS — M545 Low back pain: Secondary | ICD-10-CM | POA: Diagnosis not present

## 2020-04-23 DIAGNOSIS — M545 Low back pain: Secondary | ICD-10-CM | POA: Diagnosis not present

## 2020-04-23 DIAGNOSIS — R42 Dizziness and giddiness: Secondary | ICD-10-CM | POA: Diagnosis not present

## 2020-05-30 ENCOUNTER — Encounter: Payer: Self-pay | Admitting: Cardiology

## 2020-05-30 ENCOUNTER — Ambulatory Visit: Payer: Medicare Other | Admitting: Cardiology

## 2020-05-30 ENCOUNTER — Other Ambulatory Visit: Payer: Self-pay

## 2020-05-30 VITALS — BP 146/70 | HR 67 | Ht 71.5 in | Wt 248.0 lb

## 2020-05-30 DIAGNOSIS — I2510 Atherosclerotic heart disease of native coronary artery without angina pectoris: Secondary | ICD-10-CM | POA: Diagnosis not present

## 2020-05-30 DIAGNOSIS — I10 Essential (primary) hypertension: Secondary | ICD-10-CM

## 2020-05-30 DIAGNOSIS — I2583 Coronary atherosclerosis due to lipid rich plaque: Secondary | ICD-10-CM

## 2020-05-30 DIAGNOSIS — I209 Angina pectoris, unspecified: Secondary | ICD-10-CM

## 2020-05-30 NOTE — Progress Notes (Signed)
Cardiology Office Note:    Date:  05/30/2020   ID:  Seth Young, DOB [birth year], MRN [id number]  PCP:  Burnard Bunting, MD  [facility] Cardiologist:  Candee Furbish, MD  [facility] Electrophysiologist:  None   Referring MD: Burnard Bunting, MD    History of Present Illness:    Seth Young is a 79 y.o. male here for follow-up of coronary artery disease with prior left heart catheterization in 2019 showing diffuse triple-vessel disease with successful DES placement to mid LAD.  PDA and diagonal branches were too small for stenting.  Chronic lesions.  Back surgery several years ago.  Trying to get back and exercise.  Past Medical History:  Diagnosis Date  . Arthritis    "knees, toes, hands, probably in my back" (03/09/2018)  . Ataxia   . Basal cell carcinoma    left temporal area-no residual  . Colon polyps 10-12-11   past hx.  . Coronary artery disease   . Exertional angina ([place]) 06/26/2014   Cath with DES to the OM, Bioflow protocol   . GERD (gastroesophageal reflux disease)   . H/O hiatal hernia    no problems  . High cholesterol   . History of echocardiogram    Echo 7/19:  Mild LVH, mild focal basal septal hypertrophy, EF 60-65, no RWMA, Gr 1 DD, trivial AI, MAC  . Hypertension   . Ischemic chest pain ([place]) 08/07/2014  . Occasional tremors    Bilateral hands  . Peripheral neuropathy   . Pneumonia 1940's X 2; ~ 2017   "infant; walking pneumonia" (03/09/2018)  . Pseudogout    "it moves around"  . Sleep apnea    no cpap used  . Type II diabetes mellitus ([place]) dx'd 1982   nsulin started ~ 2000  . Vertigo     Past Surgical History:  Procedure Laterality Date  . BACK SURGERY    . BASAL CELL CARCINOMA EXCISION Left ~ 2012   face  . CARDIAC CATHETERIZATION  02/2005   Dr. Mare Ferrari; negative.  Marland Kitchen CARDIAC CATHETERIZATION N/A 05/13/2016   Procedure: Left Heart Cath and Coronary Angiography;  Surgeon: Jerline Pain, MD;  Location: [place] [facility];  Service:  Cardiovascular;  Laterality: N/A;  . CARPAL TUNNEL RELEASE Left 10/2009  . CARPAL TUNNEL RELEASE Right ~ 2013  . CATARACT EXTRACTION W/ INTRAOCULAR LENS IMPLANT Left 2000's  . CATARACT EXTRACTION W/ INTRAOCULAR LENS IMPLANT Right 2015  . COLONOSCOPY W/ POLYPECTOMY    . CORONARY ANGIOPLASTY WITH STENT PLACEMENT  06/26/2014   "1"  . CORONARY ANGIOPLASTY WITH STENT PLACEMENT  06/26/2014   pLAD 50%, d LAD 60%, oD1 80%,  mD1  70%, CFX 50%, OM 2 70%,  RCA 80%, PDA 95% (<36m), OM1 99%-0% with Bio flow stent       . CORONARY ANGIOPLASTY WITH STENT PLACEMENT  03/09/2018  . CORONARY STENT INTERVENTION N/A 03/09/2018   Procedure: CORONARY STENT INTERVENTION;  Surgeon: MBurnell Blanks MD;  [place] [facility];  Service: Cardiovascular;  Laterality: N/A;  . ELBOW SURGERY Bilateral ~ 2014   "for blockages; Dr. GAmedeo Plenty  . ENDARTERECTOMY Left 04/23/2015   Procedure: ENDARTERECTOMY CAROTID;  Surgeon: BConrad Henderson MD;  [place]  Service: Vascular;  Laterality: Left;  . EYE SURGERY Left    "related go diabetes; laser"  . KNEE ARTHROSCOPY Left 05/2011  . LEFT HEART CATH AND CORONARY ANGIOGRAPHY N/A 03/09/2018   Procedure: LEFT HEART CATH AND CORONARY ANGIOGRAPHY;  Surgeon: Burnell Blanks, MD;  Location: [place] [facility];  Service: Cardiovascular;  Laterality: N/A;  . LEFT HEART CATHETERIZATION WITH CORONARY ANGIOGRAM N/A 06/26/2014   Procedure: LEFT HEART CATHETERIZATION WITH CORONARY ANGIOGRAM;  Surgeon: Blane Ohara, MD;  Location: [facility] C[facility] LAB;  Service: Cardiovascular;  Laterality: N/A;  . LEFT HEART CATHETERIZATION WITH CORONARY ANGIOGRAM N/A 08/07/2014   Procedure: LEFT HEART CATHETERIZATION WITH CORONARY ANGIOGRAM;  Surgeon: Blane Ohara, MD;  Location: [facility] C[facility] LAB;  Service: Cardiovascular;  Laterality: N/A;  . LUMBAR LAMINECTOMY/DECOMPRESSION MICRODISCECTOMY Left 11/02/2017   Procedure: MICRODISCECTOMY LUMBAR THREE- LUMBAR FOUR, LEFT;  Surgeon: Ashok Pall, MD;   Location: [place];  Service: Neurosurgery;  Laterality: Left;  . NASAL SEPTUM SURGERY  1979  . PATCH ANGIOPLASTY Left 04/23/2015   Procedure: PATCH ANGIOPLASTY USING 1CM X 6CM XENOSURE BIOLOGIC PATCH;  Surgeon: Conrad Ple[place], MD;  Location: [place];  Service: Vascular;  Laterality: Left;  . [place]  . SHOULDER OPEN ROTATOR CUFF REPAIR  10/14/2011   Procedure: ROTATOR CUFF REPAIR SHOULDER OPEN;  Surgeon: Johnn Hai, MD;  Location: [facility];  Service: Orthopedics;  Laterality: Right;  . SUBACROMIAL DECOMPRESSION  10/14/2011   Procedure: SUBACROMIAL DECOMPRESSION;  Surgeon: Johnn Hai, MD;  Location: [facility];  Service: Orthopedics;  Laterality: Right;  . TRIGGER FINGER RELEASE Right ~ 2013-2014   "3rd & 4th digits"    Current Medications: Current Meds  Medication Sig  . acetaminophen (TYLENOL) 325 MG tablet Take 650 mg by mouth every 6 (six) hours as needed for moderate pain or headache.   Marland Kitchen aspirin 81 MG tablet Take 1 tablet (81 mg total) by mouth daily.  Marland Kitchen atorvastatin (LIPITOR) 20 MG tablet Take 20 mg by mouth daily.  . chlorhexidine (PERIDEX) 0.12 % solution SWISH WITH 1-2 OUNCE FOR 30 SECONDS THEN SPIT TWICE DAILY as needed for flare up  . clopidogrel (PLAVIX) 75 MG tablet Take 1 tablet by mouth once daily  . colchicine 0.6 MG tablet Take 0.6 mg by mouth daily as needed (gout).   . Cyanocobalamin (B-12) 5000 MCG CAPS Take 5,000 mcg by mouth daily.  . dorzolamide-timolol (COSOPT) 22.3-6.8 MG/ML ophthalmic solution Place 1 drop into both eyes 2 (two) times daily.  . empagliflozin (JARDIANCE) 25 MG TABS tablet Take 25 mg by mouth daily.  Marland Kitchen guaiFENesin (MUCINEX) 600 MG 12 hr tablet Take 600 mg by mouth 2 (two) times daily as needed for cough.  Marland Kitchen HUMALOG KWIKPEN 200 UNIT/ML SOPN Inject 10-22 Units as directed 3 (three) times daily. SLIDING SCALE  . ibuprofen (ADVIL,MOTRIN) 200 MG tablet Take 400 mg by mouth every 6 (six) hours as needed for moderate pain.  Marland Kitchen insulin  glargine (LANTUS) 100 UNIT/ML injection Inject 80 Units into the skin at bedtime.   . Insulin Pen Needle (FIFTY50 PEN NEEDLES) 31G X 8 MM MISC   . isosorbide mononitrate (IMDUR) 60 MG 24 hr tablet Take 1 tablet by mouth twice daily  . lisinopril (ZESTRIL) 5 MG tablet Take 1 tablet by mouth once daily  . LYRICA 100 MG capsule Take 100 mg by mouth 2 (two) times daily as needed. For neuropathic pain  . Melatonin 5 MG TABS Take 5 mg by mouth at bedtime.  . metFORMIN (GLUCOPHAGE) 1000 MG tablet Take 1,000 mg by mouth 2 (two) times daily with a meal.  . metoprolol tartrate (LOPRESSOR) 100 MG tablet Take 1 tablet by mouth twice daily  . nitroGLYCERIN (NITROSTAT) 0.4 MG  SL tablet DISSOLVE ONE TABLET UNDER THE TONGUE EVERY 5 MINUTES AS NEEDED FOR CHEST PAIN.  Marland Kitchen Omega-3 Fatty Acids (FISH OIL PO) Take 520 mg by mouth daily.  Marland Kitchen OVER THE COUNTER MEDICATION Apply 1 application topically daily as needed (nerve pain). Theraworx otc pain relief foam   . pantoprazole (PROTONIX) 40 MG tablet Take 1 tablet by mouth twice daily  . Probiotic Product (PROBIOTIC PO) Take 1 tablet by mouth daily.  Marland Kitchen pyridoxine (B-6) 100 MG tablet Take 100 mg by mouth daily.  Marland Kitchen tiZANidine (ZANAFLEX) 4 MG tablet Take 1 tablet by mouth at bedtime.   . triamcinolone cream (KENALOG) 0.1 % Apply 1 application topically daily as needed (lichen planus). With cetaphil     Allergies:   Gabapentin, Morphine and related, and Naproxen   Social History   Socioeconomic History  . Marital status: Married    Spouse name: Not on file  . Number of children: 5  . Years of education: College  . Highest education level: Not on file  Occupational History  . Not on file  Tobacco Use  . Smoking status: Former Smoker    Packs/day: 0.50    Years: 20.00    Pack years: 10.00    Types: Cigarettes    Quit date: 10/11/1988    Years since quitting: 31.6  . Smokeless tobacco: Never Used  Vaping Use  . Vaping Use: Never used  Substance and Sexual  Activity  . Alcohol use: No    Alcohol/week: 0.0 standard drinks    Comment: Quit drinking in 1990  . Drug use: No  . Sexual activity: Not Currently  Other Topics Concern  . Not on file  Social History Narrative   Drinks about 2 cups of coffee a day, occasional diet pepsi.    Social Determinants of Health   Financial Resource Strain:   . Difficulty of Paying Living Expenses: Not on file  Food Insecurity:   . Worried About Charity fundraiser in the Last Year: Not on file  . Ran Out of Food in the Last Year: Not on file  Transportation Needs:   . Lack of Transportation (Medical): Not on file  . Lack of Transportation (Non-Medical): Not on file  Physical Activity:   . Days of Exercise per Week: Not on file  . Minutes of Exercise per Session: Not on file  Stress:   . Feeling of Stress : Not on file  Social Connections:   . Frequency of Communication with Friends and Family: Not on file  . Frequency of Social Gatherings with Friends and Family: Not on file  . Attends Religious Services: Not on file  . Active Member of Clubs or Organizations: Not on file  . Attends Archivist Meetings: Not on file  . Marital Status: Not on file     Family History: The patient's family history includes Cancer in his sister; Diabetes in his brother, mother, and sister; Heart attack (age of onset: 7) in his father; Hypertension in his brother and sister; Varicose Veins in his mother and sister.  ROS:   Please see the history of present illness.     All other systems reviewed and are negative.  EKGs/Labs/Other Studies Reviewed:    The following studies were reviewed today:  following studies were reviewed today:  Cath 2019 1. Diffuse triple vessel CAD 2. The LAD is a large caliber vessel that courses to the apex. The proximal LAD has diffuse moderate disease but no focally  obstructive lesions. The mid LAD has an eccentric, focal, hazy stenosis. The distal LAD has mild diffuse  disease. The first diagonal branch is a small caliber vessel with diffuse severe disease, unchanged from prior cath and not large enough for PCI.  3. The Circumflex has moderate proximal and mid stenosis. The obtuse marginal stent is patent without restenosis. The moderate to large caliber intermediate branch has moderate proximal stenosis.  4. The RCA is small caliber, non-dominant vessel with diffuse severe stenosis in the small PDA and in the RV marginal branch, unchanged from last cath and too small for PCI.  5. Successful PTCA/DES x 1 mid LAD.   Recommendations: The PDA and the Diagonal are too small for stenting. Both of these branches have chronic lesions and are unchanged since the last cath in 2017. Either branch could cause angina. The LAD was stented today in a hazy mid stenosis. Hopefully this will relieve some of his angina.   EKG:  EKG is  ordered today.  The ekg ordered today demonstrates sinus rhythm 67 with  First-degree AV block noted.  242 ms.  Recent Labs: No results found for requested labs within last 8760 hours.  Recent Lipid Panel No results found for: CHOL, TRIG, HDL, CHOLHDL, VLDL, LDLCALC, LDLDIRECT   Risk Assessment/Calculations:       Physical Exam:    VS:  BP (!) 146/70   Pulse 67   Ht 5' 11.5" (1.816 m)   Wt 248 lb (112.5 kg)   SpO2 96%   BMI 34.11 kg/m     Wt Readings from Last 3 Encounters:  05/30/20 248 lb (112.5 kg)  11/30/19 257 lb (116.6 kg)  06/11/19 247 lb (112 kg)     GEN:  Well nourished, well developed in no acute distress HEENT: Normal NECK: No JVD; No carotid bruits LYMPHATICS: No lymphadenopathy CARDIAC: RRR, no murmurs, rubs, gallops RESPIRATORY:  Clear to auscultation without rales, wheezing or rhonchi  ABDOMEN: Soft, non-tender, non-distended MUSCULOSKELETAL:  No edema; No deformity  SKIN: Warm and dry NEUROLOGIC:  Alert and oriented x 3 PSYCHIATRIC:  Normal affect   ASSESSMENT:    1. Coronary artery disease due to  lipid rich plaque   2. Essential hypertension   3. Angina pectoris (HCC)    PLAN:    In order of problems listed above:  Coronary artery disease with angina -Isosorbide utilize as well as metoprolol to help with angina.  Prior stent to the LAD on 03/09/2018.  At prior visit we increased his isosorbide to 60 mg twice daily.  Seems to be doing quite well with this regimen.  Continue with current medicine management.  Essential hypertension -Doing well on current medications.  Continue to prescribe lisinopril metoprolol.  Carotid endarterectomy/carotid artery disease -2013 left endarterectomy.  No prior strokes.  Doing well.  Continue with statin aspirin.  At next visit we will order carotid Dopplers.  He is hesitant to get them currently secondary to Covid.  Hyperlipidemia -Last LDL from outside office was 62.  Overall doing well no myalgias.  Continue with atorvastatin.  Diabetes with hypertension -Hemoglobin A1c previously checked was 6.4.  Dr. [name] has been monitoring.  Also has some mucus production in the morning.  Recommended perhaps using Mucinex.  He states that this medication sometimes triggers his vertigo.   Medication Adjustments/Labs and Tests Ordered: Current medicines are reviewed at length with the patient today.  Concerns regarding medicines are outlined above.  Orders Placed This Encounter  Procedures  .  EKG 12-Lead   No orders of the defined types were placed in this encounter.   Patient Instructions  Medication Instructions:  The current medical regimen is effective;  continue present plan and medications.  *If you need a refill on your cardiac medications before your next appointment, please call your pharmacy*  Follow-Up: At [facility], you and your health needs are our priority.  As part of our continuing mission to provide you with exceptional heart care, we have created designated Provider Care Teams.  These Care Teams include your primary  Cardiologist (physician) and Advanced Practice Providers (APPs -  Physician Assistants and Nurse Practitioners) who all work together to provide you with the care you need, when you need it.  We recommend signing up for the patient portal called "MyChart".  Sign up information is provided on this After Visit Summary.  MyChart is used to connect with patients for Virtual Visits (Telemedicine).  Patients are able to view lab/test results, encounter notes, upcoming appointments, etc.  Non-urgent messages can be sent to your provider as well.   To learn more about what you can do with MyChart, go to [email or web address].    Your next appointment:   6 month(s)  The format for your next appointment:   In Person  Provider:   Candee Furbish, MD  Thank you for choosing [facility]!!         Signed, Candee Furbish, MD  05/30/2020 10:15 AM    [place]

## 2020-05-30 NOTE — Patient Instructions (Signed)

## 2020-06-03 DIAGNOSIS — H348322 Tributary (branch) retinal vein occlusion, left eye, stable: Secondary | ICD-10-CM | POA: Diagnosis not present

## 2020-06-03 DIAGNOSIS — H43821 Vitreomacular adhesion, right eye: Secondary | ICD-10-CM | POA: Diagnosis not present

## 2020-06-03 DIAGNOSIS — E113591 Type 2 diabetes mellitus with proliferative diabetic retinopathy without macular edema, right eye: Secondary | ICD-10-CM | POA: Diagnosis not present

## 2020-06-03 DIAGNOSIS — E113512 Type 2 diabetes mellitus with proliferative diabetic retinopathy with macular edema, left eye: Secondary | ICD-10-CM | POA: Diagnosis not present

## 2020-06-27 ENCOUNTER — Other Ambulatory Visit: Payer: Self-pay | Admitting: Medical

## 2020-06-27 DIAGNOSIS — R079 Chest pain, unspecified: Secondary | ICD-10-CM

## 2020-07-10 ENCOUNTER — Other Ambulatory Visit: Payer: Self-pay | Admitting: Cardiology

## 2020-07-10 DIAGNOSIS — R079 Chest pain, unspecified: Secondary | ICD-10-CM

## 2020-07-28 ENCOUNTER — Other Ambulatory Visit: Payer: Self-pay | Admitting: Internal Medicine

## 2020-07-28 DIAGNOSIS — Z794 Long term (current) use of insulin: Secondary | ICD-10-CM | POA: Diagnosis not present

## 2020-07-28 DIAGNOSIS — M5440 Lumbago with sciatica, unspecified side: Secondary | ICD-10-CM | POA: Diagnosis not present

## 2020-07-28 DIAGNOSIS — M5416 Radiculopathy, lumbar region: Secondary | ICD-10-CM

## 2020-07-28 DIAGNOSIS — I10 Essential (primary) hypertension: Secondary | ICD-10-CM | POA: Diagnosis not present

## 2020-07-28 DIAGNOSIS — E1139 Type 2 diabetes mellitus with other diabetic ophthalmic complication: Secondary | ICD-10-CM | POA: Diagnosis not present

## 2020-07-30 IMAGING — US US ABDOMEN LIMITED
1 series · 14 of 25 positions shown · non-contrast
Comparison: 11/30/2013 abdominal ultrasound

CLINICAL DATA: Nausea. Intermittent chest pain. Symptoms for
several years.

EXAM:
ULTRASOUND ABDOMEN LIMITED RIGHT UPPER QUADRANT

[Series 1: us abdomen limited · 0.25mm/px · 14 of 47 slices shown]
[im 1/47]
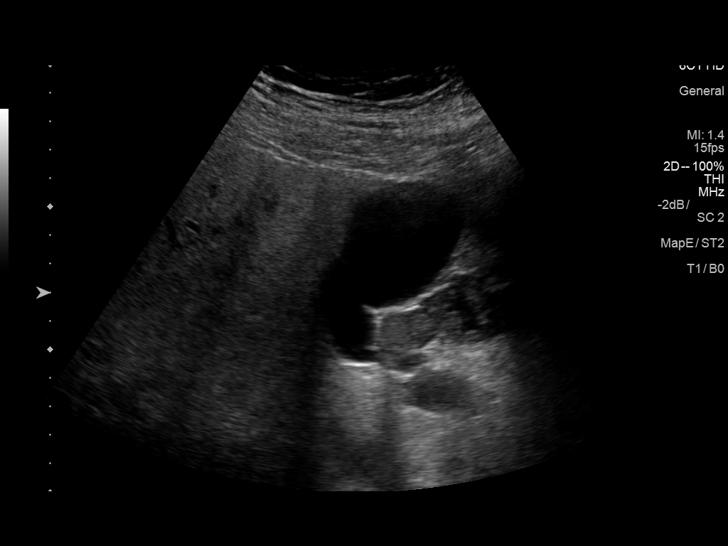
[im 4/47]
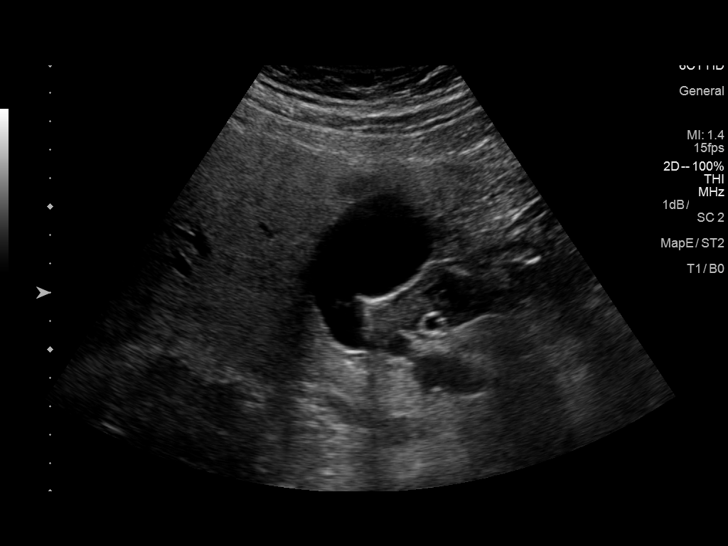
[im 8/47]
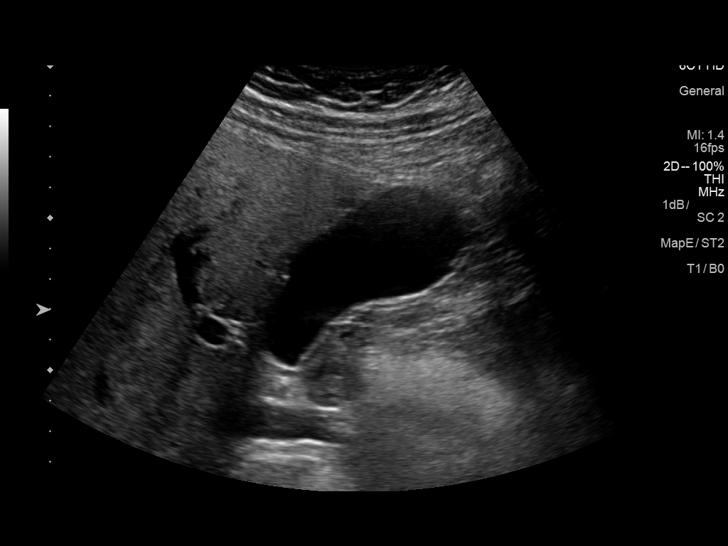
[im 12/47]
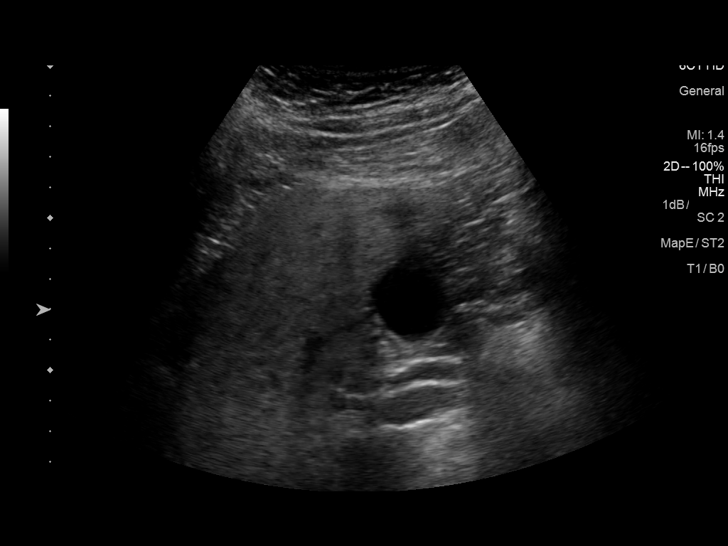
[im 16/47]
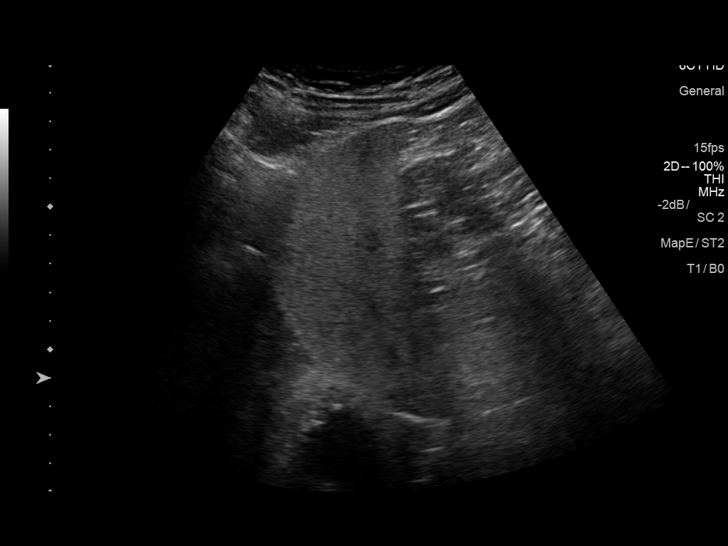
[im 18/47]
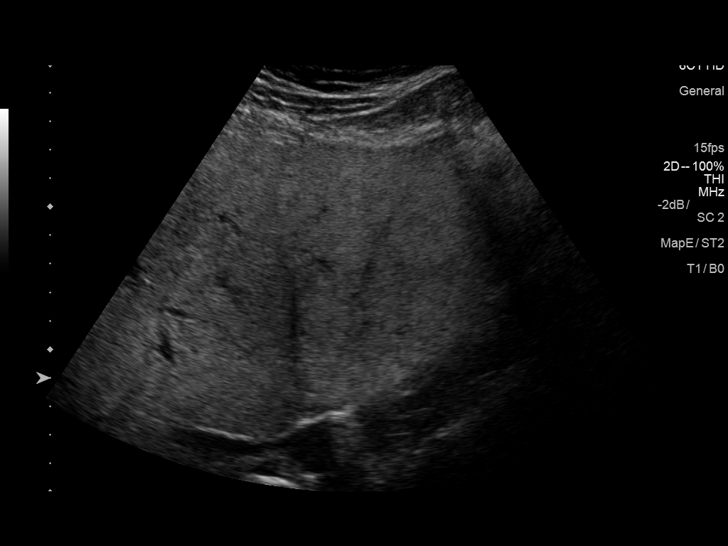
[im 22/47]
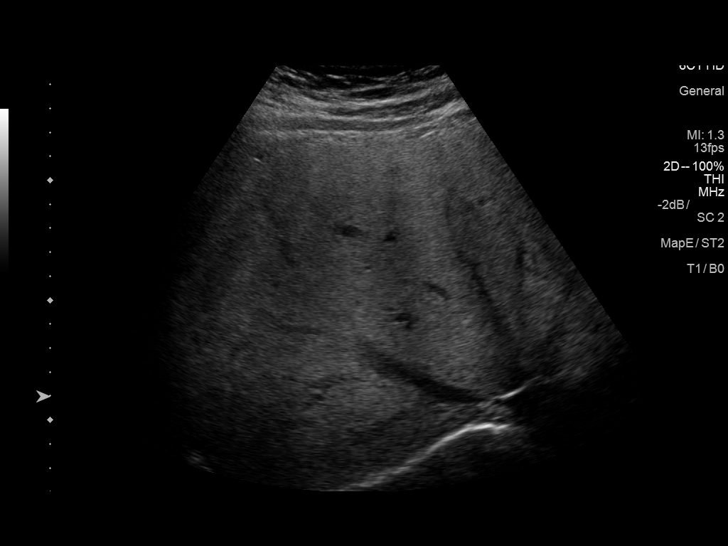
[im 25/47]
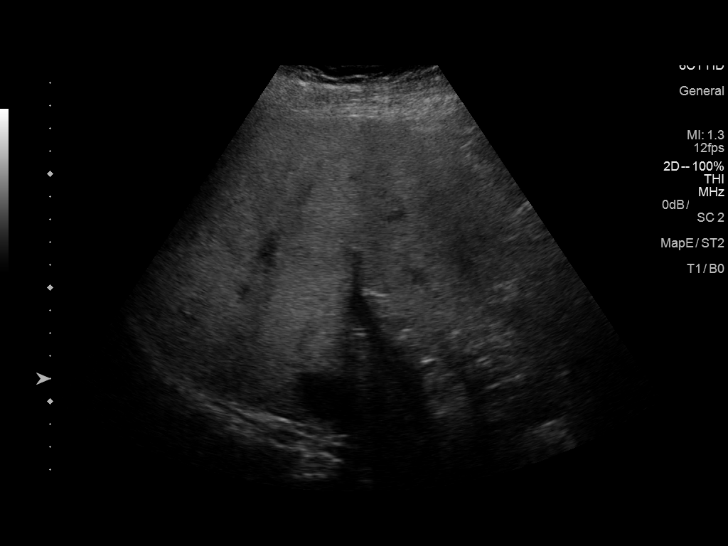
[im 29/47]
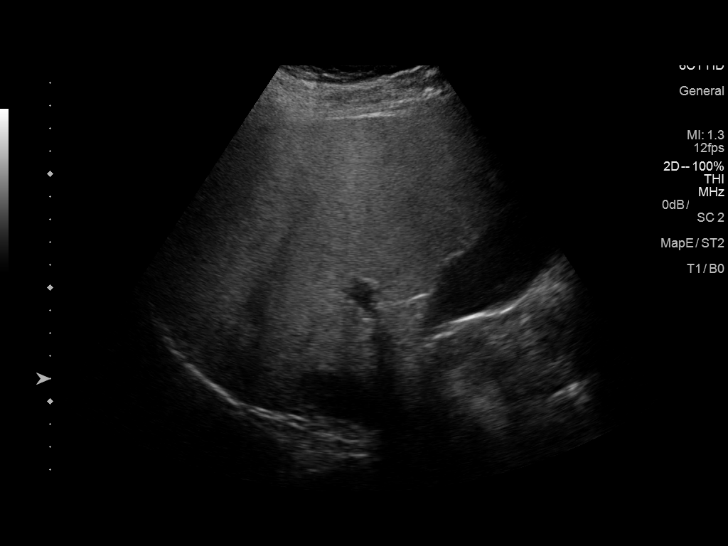
[im 31/47]
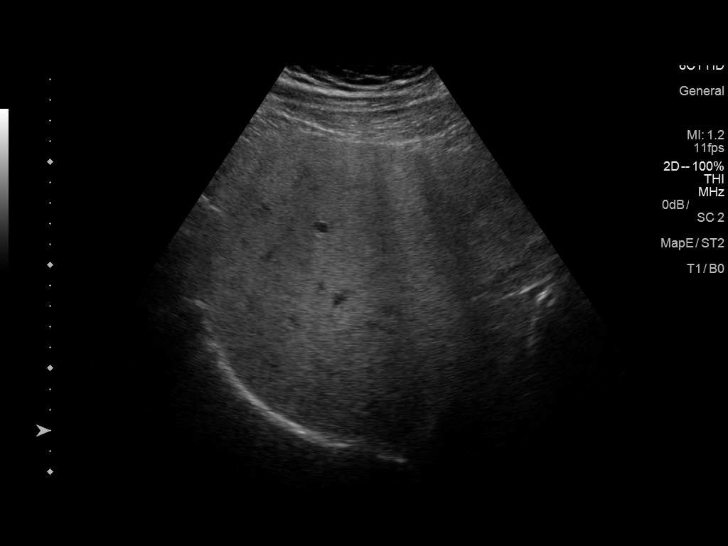
[im 35/47]
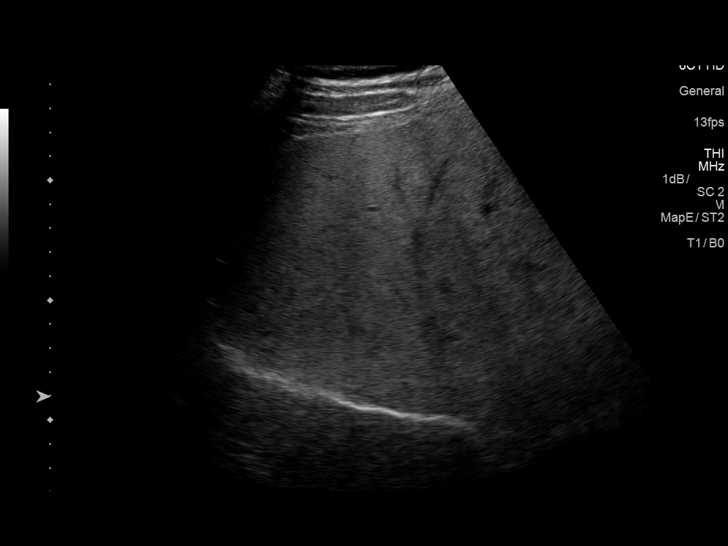
[im 39/47]
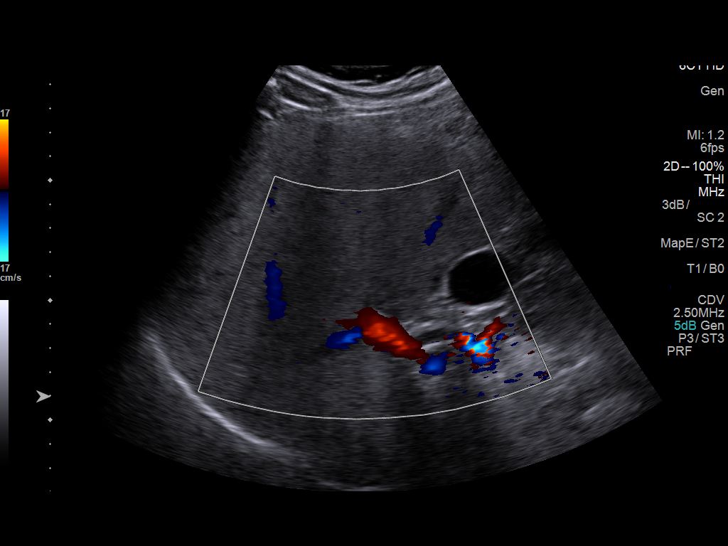
[im 43/47]
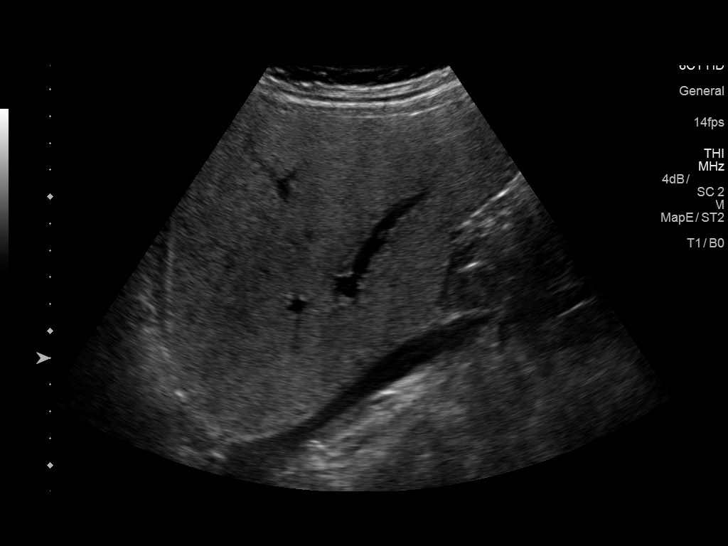
[im 47/47]
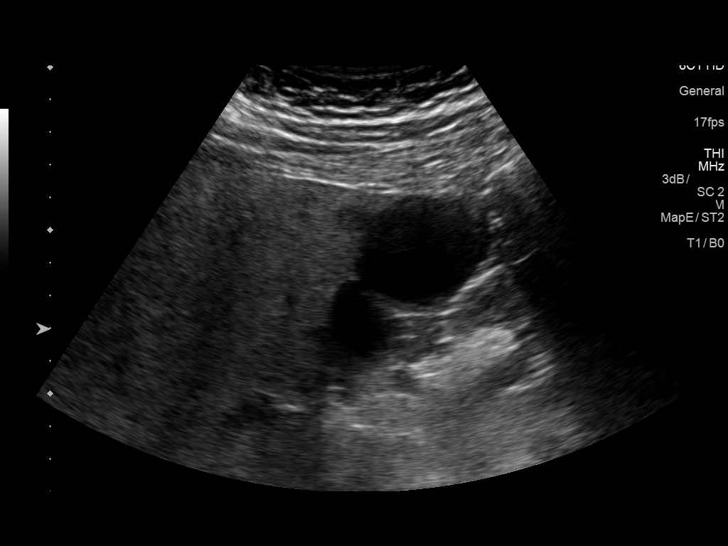

[14 of 25 positions shown; findings below may reference images not displayed]

FINDINGS: Gallbladder:

No gallstones or wall thickening visualized. No sonographic Murphy
sign noted by sonographer.

Common bile duct:

Diameter: 5 mm

Liver:

Persistently increased parenchymal echogenicity diffusely, similar
to the prior study and likely reflecting steatosis with a small
amount of fatty sparing adjacent to the gallbladder. No focal liver
abnormality identified. Portal vein is patent on color Doppler
imaging with normal direction of blood flow towards the liver.
IMPRESSION: 1. Hepatic steatosis.
2. No gallstones or biliary dilatation.

## 2020-08-23 ENCOUNTER — Other Ambulatory Visit: Payer: Self-pay

## 2020-08-23 ENCOUNTER — Ambulatory Visit
Admission: RE | Admit: 2020-08-23 | Discharge: 2020-08-23 | Disposition: A | Discharge status: Home / Self Care | Payer: Medicare Other | Source: Ambulatory Visit | Attending: Internal Medicine | Admitting: Internal Medicine

## 2020-08-23 DIAGNOSIS — M545 Low back pain, unspecified: Secondary | ICD-10-CM | POA: Diagnosis not present

## 2020-08-23 DIAGNOSIS — M5416 Radiculopathy, lumbar region: Secondary | ICD-10-CM

## 2020-09-04 DIAGNOSIS — M4726 Other spondylosis with radiculopathy, lumbar region: Secondary | ICD-10-CM | POA: Diagnosis not present

## 2020-09-04 DIAGNOSIS — M5416 Radiculopathy, lumbar region: Secondary | ICD-10-CM | POA: Diagnosis not present

## 2020-09-04 DIAGNOSIS — M4316 Spondylolisthesis, lumbar region: Secondary | ICD-10-CM | POA: Diagnosis not present

## 2020-09-04 DIAGNOSIS — M5126 Other intervertebral disc displacement, lumbar region: Secondary | ICD-10-CM | POA: Diagnosis not present

## 2020-09-04 DIAGNOSIS — M48062 Spinal stenosis, lumbar region with neurogenic claudication: Secondary | ICD-10-CM | POA: Diagnosis not present

## 2020-09-08 ENCOUNTER — Telehealth: Payer: Self-pay | Admitting: *Deleted

## 2020-09-08 NOTE — Telephone Encounter (Signed)
Dr Marlou Porch we need clearance to hold Plavix in this patient prior to back surgery. He has a history of CAD- s/p cath in 2019 showing 3V CAD.  He had a DES to the LAD.  He has been on medical rx since.  Kerin Ransom PA-C 09/08/2020 1:41 PM

## 2020-09-08 NOTE — Telephone Encounter (Signed)
   [facility] Pre-operative Risk Assessment    HEARTCARE STAFF: - Please ensure there is not already an duplicate clearance open for this procedure. - Under Visit Info/Reason for Call, type in Other and utilize the format Clearance MM/DD/YY or Clearance TBD. Do not use dashes or single digits. - If request is for dental extraction, please clarify the # of teeth to be extracted.  Request for surgical clearance:  1. What type of surgery is being performed? TRANS FORAMINAL LUMBAR  INTERBODY FUSION L3-4  2. When is this surgery scheduled? 10/01/20   3. What type of clearance is required (medical clearance vs. Pharmacy clearance to hold med vs. Both)? MEDICAL  4. Are there any medications that need to be held prior to surgery and how long? PLAVIX x 7 DAYS PRIOR TO SURGERY; MAY STAY ON ASA IN PERIOPERATIVE PERIOD PER DR. STEVENS    5. Practice name and name of physician performing surgery? [facility]; DR. Gerilyn Nestle   6. What is the office phone number? [phone]   7.   What is the office fax number? [phone]  8.   Anesthesia type (None, local, MAC, general) ? GENERAL   Seth Young 09/08/2020, 11:02 AM  _________________________________________________________________   (provider comments below)

## 2020-09-09 NOTE — Telephone Encounter (Signed)
OK to hold Plavix for 7 days prior to back surgery Seth Furbish, MD

## 2020-09-12 NOTE — Telephone Encounter (Signed)
   Primary Cardiologist: Candee Furbish, MD  Chart reviewed as part of pre-operative protocol coverage. Patient was contacted 09/12/2020 in reference to pre-operative risk assessment for pending surgery as outlined below.  SHION BLUESTEIN was last seen on 05/30/20 by Dr. Marlou Porch.  Since that day, Seth Young has done fine from a cardiac standpoint. He states he feels his heart is strong and he has not needed any nitroglycerin recently. He has chronic DOE which is unchanged. Unfortunately his back pain has slowed him down significantly to the point that he is unable to complete 4 METs.   Dr. Marlou Porch - can you weight in on his preop status given poor functional status and history of multivessel CAD with last intervention being PCI/DES to LAD in 2019?   Please route your response back to P CV DIV PREOP. Thank you!   Abigail Butts, PA-C 09/12/2020, 12:29 PM

## 2020-09-14 ENCOUNTER — Other Ambulatory Visit: Payer: Self-pay | Admitting: Cardiology

## 2020-09-14 DIAGNOSIS — R079 Chest pain, unspecified: Secondary | ICD-10-CM

## 2020-09-15 ENCOUNTER — Telehealth: Payer: Self-pay | Admitting: Cardiology

## 2020-09-15 NOTE — Telephone Encounter (Signed)
Patient is calling to speak with a nurse regarding his medical clearance for his surgery. Please call back

## 2020-09-15 NOTE — Telephone Encounter (Signed)
Mr. Peets may proceed with back surgery with intermediate overall cardiac risk.  He does have underlying coronary artery disease but this has been stable without any active angina.  Candee Furbish, MD

## 2020-09-16 NOTE — Telephone Encounter (Signed)
   Primary Cardiologist: Candee Furbish, MD  Chart reviewed as part of pre-operative protocol coverage. Patient was contacted 09/16/2020 in reference to pre-operative risk assessment for pending surgery as outlined below.  Seth Young was last seen on 05/30/20 by Dr. Marlou Porch.  Since that day, Seth Young has done well.  Per Dr. Marlou Porch, Mr. Seth Young may proceed with back surgery with intermediate overall cardiac risk.  He does have underlying coronary artery disease but this has been stable without any active angina.  OK to hold plavix for 7 days prior to procedure.   Therefore, based on ACC/AHA guidelines, the patient would be at acceptable risk for the planned procedure without further cardiovascular testing.    I will route this recommendation to the requesting party via Epic fax function and remove from pre-op pool. Please call with questions.  Tami Lin Nyheem Binette, PA 09/16/2020, 7:29 AM

## 2020-09-16 NOTE — Telephone Encounter (Signed)
Pt has been notified he has been cleared for his surgery and ok to hold Plavix x 7 days prior. Pt aware clearance notes have been sent to surgeon's office. Pt was very grateful for the call and the help.

## 2020-10-01 DIAGNOSIS — I25119 Atherosclerotic heart disease of native coronary artery with unspecified angina pectoris: Secondary | ICD-10-CM | POA: Diagnosis not present

## 2020-10-01 DIAGNOSIS — M48062 Spinal stenosis, lumbar region with neurogenic claudication: Secondary | ICD-10-CM | POA: Diagnosis not present

## 2020-10-01 DIAGNOSIS — Z79899 Other long term (current) drug therapy: Secondary | ICD-10-CM | POA: Diagnosis not present

## 2020-10-01 DIAGNOSIS — Z955 Presence of coronary angioplasty implant and graft: Secondary | ICD-10-CM | POA: Diagnosis not present

## 2020-10-01 DIAGNOSIS — Z7902 Long term (current) use of antithrombotics/antiplatelets: Secondary | ICD-10-CM | POA: Diagnosis not present

## 2020-10-01 DIAGNOSIS — I10 Essential (primary) hypertension: Secondary | ICD-10-CM | POA: Diagnosis not present

## 2020-10-01 DIAGNOSIS — M5116 Intervertebral disc disorders with radiculopathy, lumbar region: Secondary | ICD-10-CM | POA: Diagnosis not present

## 2020-10-01 DIAGNOSIS — Z7984 Long term (current) use of oral hypoglycemic drugs: Secondary | ICD-10-CM | POA: Diagnosis not present

## 2020-10-01 DIAGNOSIS — M48061 Spinal stenosis, lumbar region without neurogenic claudication: Secondary | ICD-10-CM | POA: Diagnosis not present

## 2020-10-01 DIAGNOSIS — E1151 Type 2 diabetes mellitus with diabetic peripheral angiopathy without gangrene: Secondary | ICD-10-CM | POA: Diagnosis not present

## 2020-10-01 DIAGNOSIS — E1140 Type 2 diabetes mellitus with diabetic neuropathy, unspecified: Secondary | ICD-10-CM | POA: Diagnosis not present

## 2020-10-01 DIAGNOSIS — M5416 Radiculopathy, lumbar region: Secondary | ICD-10-CM | POA: Diagnosis not present

## 2020-10-01 DIAGNOSIS — Z794 Long term (current) use of insulin: Secondary | ICD-10-CM | POA: Diagnosis not present

## 2020-10-01 DIAGNOSIS — M1990 Unspecified osteoarthritis, unspecified site: Secondary | ICD-10-CM | POA: Diagnosis not present

## 2020-10-01 DIAGNOSIS — Z87891 Personal history of nicotine dependence: Secondary | ICD-10-CM | POA: Diagnosis not present

## 2020-10-01 DIAGNOSIS — M4316 Spondylolisthesis, lumbar region: Secondary | ICD-10-CM | POA: Diagnosis not present

## 2020-10-05 ENCOUNTER — Other Ambulatory Visit: Payer: Self-pay | Admitting: Cardiology

## 2020-10-05 DIAGNOSIS — R079 Chest pain, unspecified: Secondary | ICD-10-CM

## 2020-10-17 DIAGNOSIS — Z981 Arthrodesis status: Secondary | ICD-10-CM | POA: Diagnosis not present

## 2020-10-17 DIAGNOSIS — Z4802 Encounter for removal of sutures: Secondary | ICD-10-CM | POA: Diagnosis not present

## 2020-10-30 DIAGNOSIS — Z4789 Encounter for other orthopedic aftercare: Secondary | ICD-10-CM | POA: Diagnosis not present

## 2020-10-30 DIAGNOSIS — Z981 Arthrodesis status: Secondary | ICD-10-CM | POA: Diagnosis not present

## 2020-11-21 ENCOUNTER — Other Ambulatory Visit: Payer: Self-pay | Admitting: Cardiology

## 2020-11-26 DIAGNOSIS — I10 Essential (primary) hypertension: Secondary | ICD-10-CM | POA: Diagnosis not present

## 2020-11-26 DIAGNOSIS — Z125 Encounter for screening for malignant neoplasm of prostate: Secondary | ICD-10-CM | POA: Diagnosis not present

## 2020-11-26 DIAGNOSIS — E118 Type 2 diabetes mellitus with unspecified complications: Secondary | ICD-10-CM | POA: Diagnosis not present

## 2020-11-26 DIAGNOSIS — Z0000 Encounter for general adult medical examination without abnormal findings: Secondary | ICD-10-CM | POA: Diagnosis not present

## 2020-12-03 DIAGNOSIS — R82998 Other abnormal findings in urine: Secondary | ICD-10-CM | POA: Diagnosis not present

## 2020-12-16 ENCOUNTER — Other Ambulatory Visit: Payer: Self-pay | Admitting: Cardiology

## 2020-12-25 DIAGNOSIS — Z981 Arthrodesis status: Secondary | ICD-10-CM | POA: Diagnosis not present

## 2020-12-25 DIAGNOSIS — Z4789 Encounter for other orthopedic aftercare: Secondary | ICD-10-CM | POA: Diagnosis not present

## 2020-12-26 DIAGNOSIS — H5213 Myopia, bilateral: Secondary | ICD-10-CM | POA: Diagnosis not present

## 2020-12-28 ENCOUNTER — Other Ambulatory Visit: Payer: Self-pay | Admitting: Cardiology

## 2021-01-07 ENCOUNTER — Encounter: Payer: Self-pay | Admitting: Cardiology

## 2021-01-07 ENCOUNTER — Other Ambulatory Visit: Payer: Self-pay

## 2021-01-07 ENCOUNTER — Telehealth (INDEPENDENT_AMBULATORY_CARE_PROVIDER_SITE_OTHER): Payer: Medicare Other | Admitting: Cardiology

## 2021-01-07 VITALS — BP 137/73 | Ht 71.5 in | Wt 242.0 lb

## 2021-01-07 DIAGNOSIS — E785 Hyperlipidemia, unspecified: Secondary | ICD-10-CM

## 2021-01-07 DIAGNOSIS — I779 Disorder of arteries and arterioles, unspecified: Secondary | ICD-10-CM | POA: Diagnosis not present

## 2021-01-07 DIAGNOSIS — I25119 Atherosclerotic heart disease of native coronary artery with unspecified angina pectoris: Secondary | ICD-10-CM

## 2021-01-07 DIAGNOSIS — I10 Essential (primary) hypertension: Secondary | ICD-10-CM | POA: Diagnosis not present

## 2021-01-07 DIAGNOSIS — I2583 Coronary atherosclerosis due to lipid rich plaque: Secondary | ICD-10-CM | POA: Diagnosis not present

## 2021-01-07 DIAGNOSIS — I2510 Atherosclerotic heart disease of native coronary artery without angina pectoris: Secondary | ICD-10-CM

## 2021-01-07 DIAGNOSIS — I209 Angina pectoris, unspecified: Secondary | ICD-10-CM

## 2021-01-07 NOTE — Patient Instructions (Signed)

## 2021-01-07 NOTE — Progress Notes (Signed)
Virtual Visit via Video Note   This visit type was conducted due to national recommendations for restrictions regarding the COVID-19 Pandemic (e.g. social distancing) in an effort to limit this patient's exposure and mitigate transmission in our community.  Due to his co-morbid illnesses, this patient is at least at moderate risk for complications without adequate follow up.  This format is felt to be most appropriate for this patient at this time.  All issues noted in this document were discussed and addressed.  A limited physical exam was performed with this format.  Please refer to the patient's chart for his consent to telehealth for [facility].  Video Connection Lost Video connection was lost at < 50% of the duration of this visit, at which time the remainder of the visit was completed via audio only.     Date:  01/07/2021   ID:  Seth Young, DOB 1941-05-05, MRN [id number] The patient was identified using 2 identifiers.  Patient Location: Home Provider Location: Office/Clinic   PCP:  Burnard Bunting, MD   [facility] Providers Cardiologist:  Candee Furbish, MD     Evaluation Performed:  Follow-Up Visit    History of Present Illness:    Seth Young is a 80 y.o. male here for follow-up of coronary artery disease prior left heart catheterization in 2019 showing diffuse triple-vessel CAD with DES placement to mid LAD.  PDA and diagonal branches were too small for stenting.  Chronic lesions.  He has had back surgery several years ago and again recently. Vertigo bad. Heart felt strong.  Recent cough and phlegm.  Overall not having any fevers chills nausea vomiting syncope bleeding.  The patient does not have symptoms concerning for COVID-19 infection (fever, chills, cough, or new shortness of breath).    Past Medical History:  Diagnosis Date  . Arthritis    "knees, toes, hands, probably in my back" (03/09/2018)  . Ataxia   . Basal cell carcinoma    left temporal  area-no residual  . Colon polyps 10-12-11   past hx.  . Coronary artery disease   . Exertional angina ([place]) 06/26/2014   Cath with DES to the OM, Bioflow protocol   . GERD (gastroesophageal reflux disease)   . H/O hiatal hernia    no problems  . High cholesterol   . History of echocardiogram    Echo 7/19:  Mild LVH, mild focal basal septal hypertrophy, EF 60-65, no RWMA, Gr 1 DD, trivial AI, MAC  . Hypertension   . Ischemic chest pain ([place]) 08/07/2014  . Occasional tremors    Bilateral hands  . Peripheral neuropathy   . Pneumonia 1940's X 2; ~ 2017   "infant; walking pneumonia" (03/09/2018)  . Pseudogout    "it moves around"  . Sleep apnea    no cpap used  . Type II diabetes mellitus ([place]) dx'd 1982   nsulin started ~ 2000  . Vertigo    Past Surgical History:  Procedure Laterality Date  . BACK SURGERY    . BASAL CELL CARCINOMA EXCISION Left ~ 2012   face  . CARDIAC CATHETERIZATION  02/2005   Dr. Mare Ferrari; negative.  Marland Kitchen CARDIAC CATHETERIZATION N/A 05/13/2016   Procedure: Left Heart Cath and Coronary Angiography;  Surgeon: Jerline Pain, MD;  Location: [place] [facility];  Service: Cardiovascular;  Laterality: N/A;  . CARPAL TUNNEL RELEASE Left 10/2009  . CARPAL TUNNEL RELEASE Right ~ 2013  . CATARACT EXTRACTION W/ INTRAOCULAR LENS IMPLANT Left 2000's  .  CATARACT EXTRACTION W/ INTRAOCULAR LENS IMPLANT Right 2015  . COLONOSCOPY W/ POLYPECTOMY    . CORONARY ANGIOPLASTY WITH STENT PLACEMENT  06/26/2014   "1"  . CORONARY ANGIOPLASTY WITH STENT PLACEMENT  06/26/2014   pLAD 50%, d LAD 60%, oD1 80%,  mD1  70%, CFX 50%, OM 2 70%,  RCA 80%, PDA 95% (<55m), OM1 99%-0% with Bio flow stent       . CORONARY ANGIOPLASTY WITH STENT PLACEMENT  03/09/2018  . CORONARY STENT INTERVENTION N/A 03/09/2018   Procedure: CORONARY STENT INTERVENTION;  Surgeon: MBurnell Blanks MD;  [place];  Service: Cardiovascular;  Laterality: N/A;  . ELBOW SURGERY Bilateral ~ 2014   "for  blockages; Dr. GAmedeo Plenty  . ENDARTERECTOMY Left 04/23/2015   Procedure: ENDARTERECTOMY CAROTID;  Surgeon: BConrad Clay MD;  [place]  Service: Vascular;  Laterality: Left;  . EYE SURGERY Left    "related go diabetes; laser"  . KNEE ARTHROSCOPY Left 05/2011  . LEFT HEART CATH AND CORONARY ANGIOGRAPHY N/A 03/09/2018   Procedure: LEFT HEART CATH AND CORONARY ANGIOGRAPHY;  Surgeon: MBurnell Blanks MD;  [place] [facility];  Service: Cardiovascular;  Laterality: N/A;  . LEFT HEART CATHETERIZATION WITH CORONARY ANGIOGRAM N/A 06/26/2014   Procedure: LEFT HEART CATHETERIZATION WITH CORONARY ANGIOGRAM;  Surgeon: MBlane Ohara MD;  [facility];  Service: Cardiovascular;  Laterality: N/A;  . LEFT HEART CATHETERIZATION WITH CORONARY ANGIOGRAM N/A 08/07/2014   Procedure: LEFT HEART CATHETERIZATION WITH CORONARY ANGIOGRAM;  Surgeon: MBlane Ohara MD;  [facility];  Service: Cardiovascular;  Laterality: N/A;  . LUMBAR LAMINECTOMY/DECOMPRESSION MICRODISCECTOMY Left 11/02/2017   Procedure: MICRODISCECTOMY LUMBAR THREE- LUMBAR FOUR, LEFT;  Surgeon: CAshok Pall MD;  [place]  Service: Neurosurgery;  Laterality: Left;  . NASAL SEPTUM SURGERY  1979  . PATCH ANGIOPLASTY Left 04/23/2015   Procedure: PATCH ANGIOPLASTY USING 1CM X 6CM XENOSURE BIOLOGIC PATCH;  Surgeon: BConrad Belleair MD;  [place]  Service: Vascular;  Laterality: Left;  . PUinta . SHOULDER OPEN ROTATOR CUFF REPAIR  10/14/2011   Procedure: ROTATOR CUFF REPAIR SHOULDER OPEN;  Surgeon: JJohnn Hai MD;  [facility];  Service: Orthopedics;  Laterality: Right;  . SUBACROMIAL DECOMPRESSION  10/14/2011   Procedure: SUBACROMIAL DECOMPRESSION;  Surgeon: JJohnn Hai MD;  [facility]: [facility];  Service: Orthopedics;  Laterality: Right;  . TRIGGER FINGER RELEASE Right ~ 2013-2014   "3rd & 4th digits"     Current Meds  Medication Sig  . acetaminophen (TYLENOL) 325  MG tablet Take 650 mg by mouth every 6 (six) hours as needed for moderate pain or headache.   .Marland Kitchenaspirin 81 MG tablet Take 1 tablet (81 mg total) by mouth daily.  .Marland Kitchenatorvastatin (LIPITOR) 20 MG tablet Take 20 mg by mouth daily.  . chlorhexidine (PERIDEX) 0.12 % solution SWISH WITH 1-2 OUNCE FOR 30 SECONDS THEN SPIT TWICE DAILY as needed for flare up  . clopidogrel (PLAVIX) 75 MG tablet Take 1 tablet by mouth once daily  . colchicine 0.6 MG tablet Take 0.6 mg by mouth daily as needed (gout).   . Cyanocobalamin (B-12) 5000 MCG CAPS Take 5,000 mcg by mouth daily.  . dorzolamide-timolol (COSOPT) 22.3-6.8 MG/ML ophthalmic solution Place 1 drop into both eyes 2 (two) times daily.  . empagliflozin (JARDIANCE) 25 MG TABS tablet Take 25 mg by mouth daily.  .Marland KitchenguaiFENesin (MUCINEX) 600 MG 12 hr tablet  Take 600 mg by mouth 2 (two) times daily as needed for cough.  Marland Kitchen HUMALOG KWIKPEN 200 UNIT/ML SOPN Inject 10-22 Units as directed 3 (three) times daily. SLIDING SCALE  . insulin glargine (LANTUS) 100 UNIT/ML injection Inject 80 Units into the skin at bedtime.   . Insulin Pen Needle 31G X 8 MM MISC   . isosorbide mononitrate (IMDUR) 60 MG 24 hr tablet Take 1 tablet by mouth twice daily  . lisinopril (ZESTRIL) 5 MG tablet Take 1 tablet by mouth once daily  . LYRICA 100 MG capsule Take 100 mg by mouth 2 (two) times daily as needed. For neuropathic pain  . Melatonin 5 MG TABS Take 5 mg by mouth at bedtime.  . metFORMIN (GLUCOPHAGE) 1000 MG tablet Take 1,000 mg by mouth 2 (two) times daily with a meal.  . metoprolol tartrate (LOPRESSOR) 100 MG tablet Take 1 tablet (100 mg total) by mouth 2 (two) times daily. Pt needs to keep upcoming appt in June for further refills  . nitroGLYCERIN (NITROSTAT) 0.4 MG SL tablet DISSOLVE ONE TABLET UNDER THE TONGUE EVERY 5 MINUTES AS NEEDED FOR CHEST PAIN.  Marland Kitchen Omega-3 Fatty Acids (FISH OIL PO) Take 520 mg by mouth daily.  Marland Kitchen OVER THE COUNTER MEDICATION Apply 1 application topically  daily as needed (nerve pain). Theraworx otc pain relief foam  . pantoprazole (PROTONIX) 40 MG tablet Take 1 tablet by mouth twice daily  . Probiotic Product (PROBIOTIC PO) Take 1 tablet by mouth daily.  Marland Kitchen pyridoxine (B-6) 100 MG tablet Take 100 mg by mouth daily.  Marland Kitchen triamcinolone cream (KENALOG) 0.1 % Apply 1 application topically daily as needed (lichen planus). With cetaphil     Allergies:   Gabapentin, Morphine and related, and Naproxen   Social History   Tobacco Use  . Smoking status: Former Smoker    Packs/day: 0.50    Years: 20.00    Pack years: 10.00    Types: Cigarettes    Quit date: 10/11/1988    Years since quitting: 32.2  . Smokeless tobacco: Never Used  Vaping Use  . Vaping Use: Never used  Substance Use Topics  . Alcohol use: No    Alcohol/week: 0.0 standard drinks    Comment: Quit drinking in 1990  . Drug use: No     Family Hx: The patient's family history includes Cancer in his sister; Diabetes in his brother, mother, and sister; Heart attack (age of onset: 13) in his father; Hypertension in his brother and sister; Varicose Veins in his mother and sister.  ROS:   Please see the history of present illness.     All other systems reviewed and are negative.   Prior CV studies:   The following studies were reviewed today:   Cath 2019 1. Diffuse triple vessel CAD 2. The LAD is a large caliber vessel that courses to the apex. The proximal LAD has diffuse moderate disease but no focally obstructive lesions. The mid LAD has an eccentric, focal, hazy stenosis. The distal LAD has mild diffuse disease. The first diagonal branch is a small caliber vessel with diffuse severe disease, unchanged from prior cath and not large enough for PCI.  3. The Circumflex has moderate proximal and mid stenosis. The obtuse marginal stent is patent without restenosis. The moderate to large caliber intermediate branch has moderate proximal stenosis.  4. The RCA is small caliber,  non-dominant vessel with diffuse severe stenosis in the small PDA and in the RV marginal branch, unchanged from last  cath and too small for PCI.  5. Successful PTCA/DES x 1 mid LAD.   Recommendations: The PDA and the Diagonal are too small for stenting. Both of these branches have chronic lesions and are unchanged since the last cath in 2017. Either branch could cause angina. The LAD was stented today in a hazy mid stenosis. Hopefully this will relieve some of his angina  Labs/Other Tests and Data Reviewed:    EKG: EKG from 05/30/2020 showed heart rate of 67 with first-degree AV block 242 ms.  Recent Labs: No results found for requested labs within last 8760 hours.   Recent Lipid Panel No results found for: CHOL, TRIG, HDL, CHOLHDL, LDLCALC, LDLDIRECT  Wt Readings from Last 3 Encounters:  01/07/21 242 lb (109.8 kg)  05/30/20 248 lb (112.5 kg)  11/30/19 257 lb (116.6 kg)     Risk Assessment/Calculations:     Objective:    Vital Signs:  BP 137/73   Ht 5' 11.5" (1.816 m)   Wt 242 lb (109.8 kg)   BMI 33.28 kg/m    VITAL SIGNS:  reviewed GEN:  no acute distress EYES:  sclerae anicteric, EOMI - Extraocular Movements Intact RESPIRATORY:  normal respiratory effort, symmetric expansion CARDIOVASCULAR:  no peripheral edema SKIN:  no rash, lesions or ulcers. MUSCULOSKELETAL:  no obvious deformities. NEURO:  alert and oriented x 3, no obvious focal deficit PSYCH:  normal affect  ASSESSMENT & PLAN:    Coronary artery disease with angina - Continue to utilize isosorbide as well as metoprolol to help with angina - Prior stent to the LAD on 03/09/2018.  We have increased his isosorbide to 60 mg twice a day at a prior visit.  Seems to be doing fairly well with this regimen.  Continue with current medical management with refills as needed.  Essential hypertension - Continue to prescribe lisinopril and metoprolol with refills as needed at current doses.  Continue with current medical  management.  Carotid endarterectomy/carotid artery disease - In 2013 had left carotid endarterectomy overall doing quite well.  Continue with statin and aspirin good blood pressure control.  Hyperlipidemia - Last LDL 60 overall doing well with atorvastatin.  Diabetes with hypertension - Hemoglobin A1c 6.4 previously.  Dr. Burnard Bunting has been monitoring.  6 months f/u     COVID-19 Education: The signs and symptoms of COVID-19 were discussed with the patient and how to seek care for testing (follow up with PCP or arrange E-visit).  The importance of social distancing was discussed today.  Time:   Today, I have spent 31 minutes with the patient with telehealth technology discussing the above problems.     Medication Adjustments/Labs and Tests Ordered: Current medicines are reviewed at length with the patient today.  Concerns regarding medicines are outlined above.   Tests Ordered: No orders of the defined types were placed in this encounter.   Medication Changes: No orders of the defined types were placed in this encounter.   Follow Up:  In Person in 6 month(s)  Signed, Candee Furbish, MD  01/07/2021 10:49 AM    [place]

## 2021-01-23 ENCOUNTER — Other Ambulatory Visit: Payer: Self-pay | Admitting: Cardiology

## 2021-01-26 DIAGNOSIS — J449 Chronic obstructive pulmonary disease, unspecified: Secondary | ICD-10-CM | POA: Diagnosis not present

## 2021-01-26 DIAGNOSIS — L259 Unspecified contact dermatitis, unspecified cause: Secondary | ICD-10-CM | POA: Diagnosis not present

## 2021-01-26 DIAGNOSIS — G629 Polyneuropathy, unspecified: Secondary | ICD-10-CM | POA: Diagnosis not present

## 2021-01-26 DIAGNOSIS — J069 Acute upper respiratory infection, unspecified: Secondary | ICD-10-CM | POA: Diagnosis not present

## 2021-04-06 DIAGNOSIS — J069 Acute upper respiratory infection, unspecified: Secondary | ICD-10-CM | POA: Diagnosis not present

## 2021-04-06 DIAGNOSIS — R059 Cough, unspecified: Secondary | ICD-10-CM | POA: Diagnosis not present

## 2021-04-14 ENCOUNTER — Other Ambulatory Visit: Payer: Self-pay | Admitting: Cardiology

## 2021-04-15 DIAGNOSIS — I10 Essential (primary) hypertension: Secondary | ICD-10-CM | POA: Diagnosis not present

## 2021-04-15 DIAGNOSIS — E1129 Type 2 diabetes mellitus with other diabetic kidney complication: Secondary | ICD-10-CM | POA: Diagnosis not present

## 2021-04-15 DIAGNOSIS — E782 Mixed hyperlipidemia: Secondary | ICD-10-CM | POA: Diagnosis not present

## 2021-04-15 DIAGNOSIS — E669 Obesity, unspecified: Secondary | ICD-10-CM | POA: Diagnosis not present

## 2021-05-08 DIAGNOSIS — E782 Mixed hyperlipidemia: Secondary | ICD-10-CM | POA: Diagnosis not present

## 2021-05-08 DIAGNOSIS — I10 Essential (primary) hypertension: Secondary | ICD-10-CM | POA: Diagnosis not present

## 2021-05-08 DIAGNOSIS — J449 Chronic obstructive pulmonary disease, unspecified: Secondary | ICD-10-CM | POA: Diagnosis not present

## 2021-05-08 DIAGNOSIS — I2510 Atherosclerotic heart disease of native coronary artery without angina pectoris: Secondary | ICD-10-CM | POA: Diagnosis not present

## 2021-06-09 DIAGNOSIS — H43811 Vitreous degeneration, right eye: Secondary | ICD-10-CM | POA: Diagnosis not present

## 2021-06-09 DIAGNOSIS — E113591 Type 2 diabetes mellitus with proliferative diabetic retinopathy without macular edema, right eye: Secondary | ICD-10-CM | POA: Diagnosis not present

## 2021-06-09 DIAGNOSIS — E113512 Type 2 diabetes mellitus with proliferative diabetic retinopathy with macular edema, left eye: Secondary | ICD-10-CM | POA: Diagnosis not present

## 2021-06-09 DIAGNOSIS — H348322 Tributary (branch) retinal vein occlusion, left eye, stable: Secondary | ICD-10-CM | POA: Diagnosis not present

## 2021-06-24 ENCOUNTER — Other Ambulatory Visit: Payer: Self-pay | Admitting: Cardiology

## 2021-06-24 DIAGNOSIS — R079 Chest pain, unspecified: Secondary | ICD-10-CM

## 2021-07-07 ENCOUNTER — Encounter (HOSPITAL_BASED_OUTPATIENT_CLINIC_OR_DEPARTMENT_OTHER): Payer: Self-pay | Admitting: Cardiology

## 2021-07-07 ENCOUNTER — Ambulatory Visit (HOSPITAL_BASED_OUTPATIENT_CLINIC_OR_DEPARTMENT_OTHER): Payer: Medicare Other | Admitting: Cardiology

## 2021-07-07 ENCOUNTER — Other Ambulatory Visit: Payer: Self-pay

## 2021-07-07 DIAGNOSIS — E7800 Pure hypercholesterolemia, unspecified: Secondary | ICD-10-CM

## 2021-07-07 DIAGNOSIS — I25119 Atherosclerotic heart disease of native coronary artery with unspecified angina pectoris: Secondary | ICD-10-CM | POA: Diagnosis not present

## 2021-07-07 DIAGNOSIS — I779 Disorder of arteries and arterioles, unspecified: Secondary | ICD-10-CM | POA: Diagnosis not present

## 2021-07-07 DIAGNOSIS — E0841 Diabetes mellitus due to underlying condition with diabetic mononeuropathy: Secondary | ICD-10-CM

## 2021-07-07 NOTE — Assessment & Plan Note (Addendum)
Prior hemoglobin A1c 6.4.  Dr. [name] has been monitoring closely.  Currently on Jardiance 25 mg a day as part of his diabetic regimen.  Excellent for cardiovascular disease as well.  He stopped taking Benadryl for this condition he states.  He is feeling overall better.  Prior LDL 60.

## 2021-07-07 NOTE — Progress Notes (Signed)
Cardiology Office Note   Date:  07/07/2021   ID:  Seth Young, DOB [birth year], MRN [id number]  PCP:  Burnard Bunting, MD   [facility] Providers Cardiologist:  Candee Furbish, MD     Evaluation Performed:  Follow-Up Visit  History of Present Illness:    Seth Young is a 80 y.o. male here for the follow-up of coronary artery disease and hypertension.  Prior left heart catheterization in 2019 showing diffuse triple-vessel CAD with DES placement to mid LAD.  PDA and diagonal branches were too small for stenting.  Chronic lesions.  He has had back surgery several years ago and again in 09/2020 (transforaminal lumbar interbody fusion L3-4).   At his last appointment he reported his vertigo was bad. Heart felt strong.  Cough and phlegm.   Today: Overall he is feeling well aside from a recent flare up of painful neuropathy in his bilateral hands. For treatment he was reportedly taking a lot of benadryl. His neuropathy is currently improving.  Lately his lower extremities are feeling numb, which he attributes to a lack of activity. He plans to increase his leg exercises and stretching.  He denies any palpitations, chest pain, or shortness of breath. No lightheadedness, headaches, syncope, orthopnea, PND, lower extremity edema or exertional symptoms.  Past Medical History:  Diagnosis Date   Arthritis    "knees, toes, hands, probably in my back" (03/09/2018)   Ataxia    Basal cell carcinoma    left temporal area-no residual   Colon polyps 10-12-11   past hx.   Coronary artery disease    Exertional angina ([place]) 06/26/2014   Cath with DES to the OM, Bioflow protocol    GERD (gastroesophageal reflux disease)    H/O hiatal hernia    no problems   High cholesterol    History of echocardiogram    Echo 7/19:  Mild LVH, mild focal basal septal hypertrophy, EF 60-65, no RWMA, Gr 1 DD, trivial AI, MAC   Hypertension    Ischemic chest pain (HCC) 08/07/2014   Occasional tremors     Bilateral hands   Peripheral neuropathy    Pneumonia 1940's X 2; ~ 2017   "infant; walking pneumonia" (03/09/2018)   Pseudogout    "it moves around"   Sleep apnea    no cpap used   Type II diabetes mellitus ([place]) dx'd 1982   nsulin started ~ 2000   Vertigo    Past Surgical History:  Procedure Laterality Date   BACK SURGERY     BASAL CELL CARCINOMA EXCISION Left ~ 2012   face   CARDIAC CATHETERIZATION  02/2005   Dr. Mare Ferrari; negative.   CARDIAC CATHETERIZATION N/A 05/13/2016   Procedure: Left Heart Cath and Coronary Angiography;  Surgeon: Jerline Pain, MD;  Location: [place] [facility];  Service: Cardiovascular;  Laterality: N/A;   CARPAL TUNNEL RELEASE Left 10/2009   CARPAL TUNNEL RELEASE Right ~ 2013   CATARACT EXTRACTION W/ INTRAOCULAR LENS IMPLANT Left 2000's   CATARACT EXTRACTION W/ INTRAOCULAR LENS IMPLANT Right 2015   COLONOSCOPY W/ POLYPECTOMY     CORONARY ANGIOPLASTY WITH STENT PLACEMENT  06/26/2014   "1"   CORONARY ANGIOPLASTY WITH STENT PLACEMENT  06/26/2014   pLAD 50%, d LAD 60%, oD1 80%,  mD1  70%, CFX 50%, OM 2 70%,  RCA 80%, PDA 95% (<42m), OM1 99%-0% with Bio flow stent        CORONARY ANGIOPLASTY WITH STENT PLACEMENT  03/09/2018   CORONARY  STENT INTERVENTION N/A 03/09/2018   Procedure: CORONARY STENT INTERVENTION;  Surgeon: Burnell Blanks, MD;  Location: [place] [facility];  Service: Cardiovascular;  Laterality: N/A;   ELBOW SURGERY Bilateral ~ 2014   "for blockages; Dr. Amedeo Plenty"   ENDARTERECTOMY Left 04/23/2015   Procedure: ENDARTERECTOMY CAROTID;  Surgeon: Conrad Cal-Nev-Ari, MD;  Location: [place];  Service: Vascular;  Laterality: Left;   EYE SURGERY Left    "related go diabetes; laser"   KNEE ARTHROSCOPY Left 05/2011   LEFT HEART CATH AND CORONARY ANGIOGRAPHY N/A 03/09/2018   Procedure: LEFT HEART CATH AND CORONARY ANGIOGRAPHY;  Surgeon: Burnell Blanks, MD;  Location: [place] [facility];  Service: Cardiovascular;  Laterality: N/A;   LEFT HEART  CATHETERIZATION WITH CORONARY ANGIOGRAM N/A 06/26/2014   Procedure: LEFT HEART CATHETERIZATION WITH CORONARY ANGIOGRAM;  Surgeon: Blane Ohara, MD;  Location: [facility];  Service: Cardiovascular;  Laterality: N/A;   LEFT HEART CATHETERIZATION WITH CORONARY ANGIOGRAM N/A 08/07/2014   Procedure: LEFT HEART CATHETERIZATION WITH CORONARY ANGIOGRAM;  Surgeon: Blane Ohara, MD;  Location: [facility] C[facility] LAB;  Service: Cardiovascular;  Laterality: N/A;   LUMBAR LAMINECTOMY/DECOMPRESSION MICRODISCECTOMY Left 11/02/2017   Procedure: MICRODISCECTOMY LUMBAR THREE- LUMBAR FOUR, LEFT;  Surgeon: Ashok Pall, MD;  Location: [place];  Service: Neurosurgery;  Laterality: Left;   NASAL SEPTUM SURGERY  1979   PATCH ANGIOPLASTY Left 04/23/2015   Procedure: PATCH ANGIOPLASTY USING 1CM X 6CM XENOSURE BIOLOGIC PATCH;  Surgeon: Conrad Howard, MD;  Location: [place];  Service: Vascular;  Laterality: Left;   [place]  10/14/2011   Procedure: ROTATOR CUFF REPAIR SHOULDER OPEN;  Surgeon: Johnn Hai, MD;  Location: [facility];  Service: Orthopedics;  Laterality: Right;   SUBACROMIAL DECOMPRESSION  10/14/2011   Procedure: SUBACROMIAL DECOMPRESSION;  Surgeon: Johnn Hai, MD;  Location: [facility];  Service: Orthopedics;  Laterality: Right;   TRIGGER FINGER RELEASE Right ~ 2013-2014   "3rd & 4th digits"     Current Meds  Medication Sig   acetaminophen (TYLENOL) 325 MG tablet Take 650 mg by mouth every 6 (six) hours as needed for moderate pain or headache.    aspirin 81 MG tablet Take 1 tablet (81 mg total) by mouth daily.   atorvastatin (LIPITOR) 20 MG tablet Take 20 mg by mouth daily.   augmented betamethasone dipropionate (DIPROLENE-AF) 0.05 % cream as needed for itching.   chlorhexidine (PERIDEX) 0.12 % solution SWISH WITH 1-2 OUNCE FOR 30 SECONDS THEN SPIT TWICE DAILY as needed for flare up   chlorpheniramine-HYDROcodone (TUSSIONEX) 10-8 MG/5ML SUER as needed for  cough.   clopidogrel (PLAVIX) 75 MG tablet Take 1 tablet by mouth once daily   colchicine 0.6 MG tablet Take 0.6 mg by mouth daily as needed (gout).    Cyanocobalamin (B-12) 5000 MCG CAPS Take 5,000 mcg by mouth daily.   dorzolamide-timolol (COSOPT) 22.3-6.8 MG/ML ophthalmic solution Place 1 drop into both eyes 2 (two) times daily.   empagliflozin (JARDIANCE) 25 MG TABS tablet Take 25 mg by mouth daily.   HUMALOG KWIKPEN 200 UNIT/ML SOPN Inject 10-22 Units as directed 3 (three) times daily. SLIDING SCALE   HYDROcodone-acetaminophen (NORCO) 7.5-325 MG tablet as needed for pain.   insulin glargine (LANTUS) 100 UNIT/ML injection Inject 80 Units into the skin at bedtime.    Insulin Pen Needle 31G X 8 MM MISC    isosorbide mononitrate (IMDUR) 60 MG 24 hr tablet Take  1 tablet by mouth twice daily   lisinopril (ZESTRIL) 5 MG tablet Take 1 tablet by mouth once daily   LYRICA 100 MG capsule Take 100 mg by mouth 2 (two) times daily as needed. For neuropathic pain   Melatonin 5 MG TABS Take 5 mg by mouth at bedtime.   metFORMIN (GLUCOPHAGE) 1000 MG tablet Take 1,000 mg by mouth 2 (two) times daily with a meal.   methocarbamol (ROBAXIN) 750 MG tablet as needed for pain.   metoprolol tartrate (LOPRESSOR) 100 MG tablet TAKE 1 TABLET BY MOUTH TWICE DAILY . APPOINTMENT REQUIRED FOR FUTURE REFILLS   Multiple Vitamin (MULTI VITAMIN) TABS daily.   nitroGLYCERIN (NITROSTAT) 0.4 MG SL tablet DISSOLVE ONE TABLET UNDER THE TONGUE EVERY 5 MINUTES AS NEEDED FOR CHEST PAIN.   Omega-3 Fatty Acids (FISH OIL PO) Take 520 mg by mouth daily.   OVER THE COUNTER MEDICATION Apply 1 application topically daily as needed (nerve pain). Theraworx otc pain relief foam   pantoprazole (PROTONIX) 40 MG tablet Take 1 tablet by mouth twice daily   Probiotic Product (PROBIOTIC PO) Take 1 tablet by mouth daily.   pyridoxine (B-6) 100 MG tablet Take 100 mg by mouth daily.   triamcinolone cream (KENALOG) 0.1 % Apply 1 application topically  daily as needed (lichen planus). With cetaphil     Allergies:   Dulaglutide, Insulin degludec, Gabapentin, Morphine and related, and Naproxen   Social History   Tobacco Use   Smoking status: Former    Packs/day: 0.50    Years: 20.00    Pack years: 10.00    Types: Cigarettes    Quit date: 10/11/1988    Years since quitting: 32.7   Smokeless tobacco: Never  Vaping Use   Vaping Use: Never used  Substance Use Topics   Alcohol use: No    Alcohol/week: 0.0 standard drinks    Comment: Quit drinking in 1990   Drug use: No     Family Hx: The patient's family history includes Cancer in his sister; Diabetes in his brother, mother, and sister; Heart attack (age of onset: 39) in his father; Hypertension in his brother and sister; Varicose Veins in his mother and sister.  ROS:   Please see the history of present illness.    (+) Bilateral LE numbness (+) Neuropathy, pain in bilateral hands All other systems reviewed and are negative.   Prior CV studies:   The following studies were reviewed today:  Cath 2019 1. Diffuse triple vessel CAD 2. The LAD is a large caliber vessel that courses to the apex. The proximal LAD has diffuse moderate disease but no focally obstructive lesions. The mid LAD has an eccentric, focal, hazy stenosis. The distal LAD has mild diffuse disease. The first diagonal branch is a small caliber vessel with diffuse severe disease, unchanged from prior cath and not large enough for PCI.  3. The Circumflex has moderate proximal and mid stenosis. The obtuse marginal stent is patent without restenosis. The moderate to large caliber intermediate branch has moderate proximal stenosis.  4. The RCA is small caliber, non-dominant vessel with diffuse severe stenosis in the small PDA and in the RV marginal branch, unchanged from last cath and too small for PCI.  5. Successful PTCA/DES x 1 mid LAD.    Recommendations: The PDA and the Diagonal are too small for stenting. Both of  these branches have chronic lesions and are unchanged since the last cath in 2017. Either branch could cause angina. The LAD was  stented today in a hazy mid stenosis. Hopefully this will relieve some of his angina  Diagnostic Dominance: Left Intervention    Echo 02/16/2018: Study Conclusions  - Left ventricle: The cavity size was normal. Wall thickness was    increased in a pattern of mild LVH. There was mild focal basal    hypertrophy of the septum. Systolic function was normal. The    estimated ejection fraction was in the range of 60% to 65%. Wall    motion was normal; there were no regional wall motion    abnormalities. Doppler parameters are consistent with abnormal    left ventricular relaxation (grade 1 diastolic dysfunction).  - Aortic valve: There was trivial regurgitation.  - Mitral valve: Calcified annulus. Mildly thickened leaflets .   Impressions:  - Normal LV function; mild LVH; mild diastolic dysfunction; trace    AI; redundant MV chordae.   Labs/Other Tests and Data Reviewed:    EKG: EKG is personally reviewed and interpreted. 07/07/2021: Sinus rhythm. Rate 65 bpm. First-degree AV block.  05/30/2020: heart rate of 67 with first-degree AV block 242 ms.  Recent Labs: No results found for requested labs within last 8760 hours.   Recent Lipid Panel No results found for: CHOL, TRIG, HDL, CHOLHDL, LDLCALC, LDLDIRECT  Risk Assessment/Calculations:     Objective:    VS:  BP 120/60   Pulse 65   Ht 5' 11.5" (1.816 m)   Wt 248 lb (112.5 kg)   SpO2 96%   BMI 34.11 kg/m     Wt Readings from Last 3 Encounters:  07/07/21 248 lb (112.5 kg)  01/07/21 242 lb (109.8 kg)  05/30/20 248 lb (112.5 kg)    GEN: Well nourished, well developed in no acute distress HEENT: Normal NECK: No JVD; No carotid bruits LYMPHATICS: No lymphadenopathy CARDIAC: RRR, no murmurs, rubs, gallops RESPIRATORY:  Clear to auscultation without rales, wheezing or rhonchi  ABDOMEN: Soft,  non-tender, non-distended MUSCULOSKELETAL:  No edema; No deformity  SKIN: Warm and dry NEUROLOGIC:  Alert and oriented x 3 PSYCHIATRIC:  Normal affect   ASSESSMENT & PLAN:    Coronary artery disease involving native coronary artery of native heart with angina pectoris ([place]) Continuing to use isosorbide as well as metoprolol to help with anginal symptoms.  Prior stent to the LAD on 03/09/2018.  Cardiac catheterization reviewed as above.  At prior visit we increased his isosorbide to 60 mg twice a day and this seemed to help out with his symptoms.  Continue.  Continue with current medical management refills as needed.  Carotid artery disease without cerebral infarction [facility]) 2013 had a left carotid endarterectomy overall doing well.  Continue with statin aspirin continue good blood pressure control.  Monitor for any symptoms.  Diabetic neuropathy Prior hemoglobin A1c 6.4.  Dr. [name] has been monitoring closely.  Currently on Jardiance 25 mg a day as part of his diabetic regimen.  Excellent for cardiovascular disease as well.  He stopped taking Benadryl for this condition he states.  He is feeling overall better.  Prior LDL 60.  Hypercholesterolemia Continue with atorvastatin 20 mg a day.  Maximally tolerated dose.  Prior LDL has been less than 70.     Medication Adjustments/Labs and Tests Ordered: Current medicines are reviewed at length with the patient today.  Concerns regarding medicines are outlined above.   Tests Ordered: Orders Placed This Encounter  Procedures   EKG 12-Lead    Medication Changes: No orders of the defined types were placed in  this encounter.   Follow Up:  6 months   I,Mathew Stumpf,acting as a scribe for [employer], MD.,have documented all relevant documentation on the behalf of Candee Furbish, MD,as directed by  Candee Furbish, MD while in the presence of Candee Furbish, MD.  I, Candee Furbish, MD, have reviewed all documentation for this visit. The documentation on  07/07/21 for the exam, diagnosis, procedures, and orders are all accurate and complete.   Signed, Candee Furbish, MD  07/07/2021 9:14 AM    [facility]

## 2021-07-07 NOTE — Assessment & Plan Note (Signed)
Continue with atorvastatin 20 mg a day.  Maximally tolerated dose.  Prior LDL has been less than 70.

## 2021-07-07 NOTE — Assessment & Plan Note (Signed)
Continuing to use isosorbide as well as metoprolol to help with anginal symptoms.  Prior stent to the LAD on 03/09/2018.  Cardiac catheterization reviewed as above.  At prior visit we increased his isosorbide to 60 mg twice a day and this seemed to help out with his symptoms.  Continue.  Continue with current medical management refills as needed.

## 2021-07-07 NOTE — Assessment & Plan Note (Signed)
2013 had a left carotid endarterectomy overall doing well.  Continue with statin aspirin continue good blood pressure control.  Monitor for any symptoms.

## 2021-07-07 NOTE — Patient Instructions (Signed)
Medication Instructions:  The current medical regimen is effective;  continue present plan and medications.  *If you need a refill on your cardiac medications before your next appointment, please call your pharmacy*  Follow-Up: At [facility], you and your health needs are our priority.  As part of our continuing mission to provide you with exceptional heart care, we have created designated Provider Care Teams.  These Care Teams include your primary Cardiologist (physician) and Advanced Practice Providers (APPs -  Physician Assistants and Nurse Practitioners) who all work together to provide you with the care you need, when you need it.  We recommend signing up for the patient portal called "MyChart".  Sign up information is provided on this After Visit Summary.  MyChart is used to connect with patients for Virtual Visits (Telemedicine).  Patients are able to view lab/test results, encounter notes, upcoming appointments, etc.  Non-urgent messages can be sent to your provider as well.   To learn more about what you can do with MyChart, go to [email or web address].    Your next appointment:   6 month(s)  The format for your next appointment:   In Person  Provider:   With any NP or PA in 6 months and  Candee Furbish, MD     Thank you for choosing [facility]!!

## 2021-08-17 DIAGNOSIS — E1129 Type 2 diabetes mellitus with other diabetic kidney complication: Secondary | ICD-10-CM | POA: Diagnosis not present

## 2021-08-17 DIAGNOSIS — G629 Polyneuropathy, unspecified: Secondary | ICD-10-CM | POA: Diagnosis not present

## 2021-08-17 DIAGNOSIS — I10 Essential (primary) hypertension: Secondary | ICD-10-CM | POA: Diagnosis not present

## 2021-08-17 DIAGNOSIS — E669 Obesity, unspecified: Secondary | ICD-10-CM | POA: Diagnosis not present

## 2021-09-22 DIAGNOSIS — E11319 Type 2 diabetes mellitus with unspecified diabetic retinopathy without macular edema: Secondary | ICD-10-CM | POA: Diagnosis not present

## 2021-10-02 ENCOUNTER — Other Ambulatory Visit: Payer: Self-pay | Admitting: Cardiology

## 2021-10-19 DIAGNOSIS — H401111 Primary open-angle glaucoma, right eye, mild stage: Secondary | ICD-10-CM | POA: Diagnosis not present

## 2021-11-06 DIAGNOSIS — I10 Essential (primary) hypertension: Secondary | ICD-10-CM | POA: Diagnosis not present

## 2021-11-06 DIAGNOSIS — I2510 Atherosclerotic heart disease of native coronary artery without angina pectoris: Secondary | ICD-10-CM | POA: Diagnosis not present

## 2021-11-06 DIAGNOSIS — J449 Chronic obstructive pulmonary disease, unspecified: Secondary | ICD-10-CM | POA: Diagnosis not present

## 2021-11-06 DIAGNOSIS — E782 Mixed hyperlipidemia: Secondary | ICD-10-CM | POA: Diagnosis not present

## 2021-11-18 DIAGNOSIS — E1129 Type 2 diabetes mellitus with other diabetic kidney complication: Secondary | ICD-10-CM | POA: Diagnosis not present

## 2021-11-23 DIAGNOSIS — H524 Presbyopia: Secondary | ICD-10-CM | POA: Diagnosis not present

## 2021-11-23 DIAGNOSIS — H401111 Primary open-angle glaucoma, right eye, mild stage: Secondary | ICD-10-CM | POA: Diagnosis not present

## 2021-12-06 DIAGNOSIS — I2510 Atherosclerotic heart disease of native coronary artery without angina pectoris: Secondary | ICD-10-CM | POA: Diagnosis not present

## 2021-12-06 DIAGNOSIS — I10 Essential (primary) hypertension: Secondary | ICD-10-CM | POA: Diagnosis not present

## 2021-12-06 DIAGNOSIS — J449 Chronic obstructive pulmonary disease, unspecified: Secondary | ICD-10-CM | POA: Diagnosis not present

## 2021-12-08 DIAGNOSIS — H348322 Tributary (branch) retinal vein occlusion, left eye, stable: Secondary | ICD-10-CM | POA: Diagnosis not present

## 2021-12-08 DIAGNOSIS — H34232 Retinal artery branch occlusion, left eye: Secondary | ICD-10-CM | POA: Diagnosis not present

## 2021-12-08 DIAGNOSIS — E113591 Type 2 diabetes mellitus with proliferative diabetic retinopathy without macular edema, right eye: Secondary | ICD-10-CM | POA: Diagnosis not present

## 2021-12-08 DIAGNOSIS — E113512 Type 2 diabetes mellitus with proliferative diabetic retinopathy with macular edema, left eye: Secondary | ICD-10-CM | POA: Diagnosis not present

## 2021-12-09 DIAGNOSIS — E782 Mixed hyperlipidemia: Secondary | ICD-10-CM | POA: Diagnosis not present

## 2021-12-09 DIAGNOSIS — Z79899 Other long term (current) drug therapy: Secondary | ICD-10-CM | POA: Diagnosis not present

## 2021-12-09 DIAGNOSIS — I10 Essential (primary) hypertension: Secondary | ICD-10-CM | POA: Diagnosis not present

## 2021-12-09 DIAGNOSIS — E1139 Type 2 diabetes mellitus with other diabetic ophthalmic complication: Secondary | ICD-10-CM | POA: Diagnosis not present

## 2021-12-16 ENCOUNTER — Other Ambulatory Visit: Payer: Self-pay | Admitting: Cardiology

## 2021-12-16 DIAGNOSIS — Z1339 Encounter for screening examination for other mental health and behavioral disorders: Secondary | ICD-10-CM | POA: Diagnosis not present

## 2021-12-16 DIAGNOSIS — I10 Essential (primary) hypertension: Secondary | ICD-10-CM | POA: Diagnosis not present

## 2021-12-16 DIAGNOSIS — Z794 Long term (current) use of insulin: Secondary | ICD-10-CM | POA: Diagnosis not present

## 2021-12-16 DIAGNOSIS — Z0000 Encounter for general adult medical examination without abnormal findings: Secondary | ICD-10-CM | POA: Diagnosis not present

## 2021-12-16 DIAGNOSIS — R82998 Other abnormal findings in urine: Secondary | ICD-10-CM | POA: Diagnosis not present

## 2021-12-16 DIAGNOSIS — I2510 Atherosclerotic heart disease of native coronary artery without angina pectoris: Secondary | ICD-10-CM | POA: Diagnosis not present

## 2021-12-16 DIAGNOSIS — I6522 Occlusion and stenosis of left carotid artery: Secondary | ICD-10-CM | POA: Diagnosis not present

## 2021-12-16 DIAGNOSIS — Z1331 Encounter for screening for depression: Secondary | ICD-10-CM | POA: Diagnosis not present

## 2021-12-18 DIAGNOSIS — E1129 Type 2 diabetes mellitus with other diabetic kidney complication: Secondary | ICD-10-CM | POA: Diagnosis not present

## 2022-01-14 ENCOUNTER — Other Ambulatory Visit: Payer: Self-pay | Admitting: Cardiology

## 2022-01-16 ENCOUNTER — Other Ambulatory Visit: Payer: Self-pay | Admitting: Cardiology

## 2022-01-17 DIAGNOSIS — E1129 Type 2 diabetes mellitus with other diabetic kidney complication: Secondary | ICD-10-CM | POA: Diagnosis not present

## 2022-02-16 DIAGNOSIS — E1129 Type 2 diabetes mellitus with other diabetic kidney complication: Secondary | ICD-10-CM | POA: Diagnosis not present

## 2022-02-24 DIAGNOSIS — G8929 Other chronic pain: Secondary | ICD-10-CM | POA: Diagnosis not present

## 2022-02-24 DIAGNOSIS — E1140 Type 2 diabetes mellitus with diabetic neuropathy, unspecified: Secondary | ICD-10-CM | POA: Diagnosis not present

## 2022-02-24 DIAGNOSIS — G629 Polyneuropathy, unspecified: Secondary | ICD-10-CM | POA: Diagnosis not present

## 2022-03-02 DIAGNOSIS — H401131 Primary open-angle glaucoma, bilateral, mild stage: Secondary | ICD-10-CM | POA: Diagnosis not present

## 2022-03-03 ENCOUNTER — Other Ambulatory Visit: Payer: Self-pay | Admitting: Cardiology

## 2022-03-14 ENCOUNTER — Other Ambulatory Visit: Payer: Self-pay | Admitting: Cardiology

## 2022-03-14 DIAGNOSIS — R079 Chest pain, unspecified: Secondary | ICD-10-CM

## 2022-03-18 DIAGNOSIS — E1129 Type 2 diabetes mellitus with other diabetic kidney complication: Secondary | ICD-10-CM | POA: Diagnosis not present

## 2022-03-31 ENCOUNTER — Other Ambulatory Visit: Payer: Self-pay | Admitting: Cardiology

## 2022-03-31 DIAGNOSIS — E11319 Type 2 diabetes mellitus with unspecified diabetic retinopathy without macular edema: Secondary | ICD-10-CM | POA: Diagnosis not present

## 2022-03-31 DIAGNOSIS — G629 Polyneuropathy, unspecified: Secondary | ICD-10-CM | POA: Diagnosis not present

## 2022-03-31 DIAGNOSIS — R079 Chest pain, unspecified: Secondary | ICD-10-CM

## 2022-03-31 DIAGNOSIS — E1129 Type 2 diabetes mellitus with other diabetic kidney complication: Secondary | ICD-10-CM | POA: Diagnosis not present

## 2022-03-31 DIAGNOSIS — I129 Hypertensive chronic kidney disease with stage 1 through stage 4 chronic kidney disease, or unspecified chronic kidney disease: Secondary | ICD-10-CM | POA: Diagnosis not present

## 2022-04-02 ENCOUNTER — Ambulatory Visit: Payer: Medicare Other | Admitting: Nurse Practitioner

## 2022-04-02 ENCOUNTER — Encounter: Payer: Self-pay | Admitting: Nurse Practitioner

## 2022-04-02 VITALS — BP 112/62 | HR 68 | Ht 71.5 in | Wt 246.0 lb

## 2022-04-02 DIAGNOSIS — I2510 Atherosclerotic heart disease of native coronary artery without angina pectoris: Secondary | ICD-10-CM

## 2022-04-02 DIAGNOSIS — I10 Essential (primary) hypertension: Secondary | ICD-10-CM | POA: Diagnosis not present

## 2022-04-02 DIAGNOSIS — E785 Hyperlipidemia, unspecified: Secondary | ICD-10-CM

## 2022-04-02 DIAGNOSIS — I779 Disorder of arteries and arterioles, unspecified: Secondary | ICD-10-CM

## 2022-04-02 NOTE — Patient Instructions (Signed)
Medication Instructions:   Your physician recommends that you continue on your current medications as directed. Please refer to the Current Medication list given to you today.   *If you need a refill on your cardiac medications before your next appointment, please call your pharmacy*   Lab Work:  None ordered.  If you have labs (blood work) drawn today and your tests are completely normal, you will receive your results only by: [place] (if you have MyChart) OR A paper copy in the mail If you have any lab test that is abnormal or we need to change your treatment, we will call you to review the results.   Testing/Procedures:  None ordered.   Follow-Up: At [facility], you and your health needs are our priority.  As part of our continuing mission to provide you with exceptional heart care, we have created designated Provider Care Teams.  These Care Teams include your primary Cardiologist (physician) and Advanced Practice Providers (APPs -  Physician Assistants and Nurse Practitioners) who all work together to provide you with the care you need, when you need it.  We recommend signing up for the patient portal called "MyChart".  Sign up information is provided on this After Visit Summary.  MyChart is used to connect with patients for Virtual Visits (Telemedicine).  Patients are able to view lab/test results, encounter notes, upcoming appointments, etc.  Non-urgent messages can be sent to your provider as well.   To learn more about what you can do with MyChart, go to [email or web address].    Your next appointment:   1 year(s)  The format for your next appointment:   In Person  Provider:   Candee Furbish, MD     Other Instructions  Your physician wants you to follow-up in: 1 year with Dr.Skains.  You will receive a reminder letter in the mail two months in advance. If you don't receive a letter, please call our office to schedule the follow-up  appointment.   Important Information About Sugar

## 2022-04-02 NOTE — Progress Notes (Signed)
Cardiology Office Note:    Date:  04/02/2022   ID:  Seth Young, DOB April 25, 1941, MRN [id number]  PCP:  Burnard Bunting, MD   [facility] Providers Cardiologist:  Candee Furbish, MD     Referring MD: Burnard Bunting, MD   Chief Complaint: annual follow-up CAD   History of Present Illness:    Seth Young is a very pleasant 81 y.o. male with a hx of CAD with prior stent to LAD 03/2018, carotid artery disease s/p left carotid endarterectomy, HLD, vertigo, neuropathy, and HTN.   Prior back surgery. He quit smoking in 1990.  Prior left heart catheterization 2019 showing diffuse triple-vessel CAD with DES placement to mid LAD.  PDA and diagonal branches were too small for stenting, chronic lesions.  Last echocardiogram 02/2018 revealed normal LV EF, mild diastolic dysfunction, mild LVH, trivial AI.   Today, he is here alone for follow-up. He ambulates with a cane for stability. Reports a couple of occasions of shortness of breath that occurred without exertion. Took ntg x 1 on each occasion with relief of symptoms.  Exercise is limited by neuropathy in his hands and previous back surgery.  He is swimming laps 5-8 laps on occasion, doing stretches, and is up and down stairs at his [place] in [place]. He denies chest pain, shortness of breath, lower extremity edema, fatigue, palpitations, melena, hematuria, hemoptysis, diaphoresis, weakness, presyncope, syncope, orthopnea, and PND.  Past Medical History:  Diagnosis Date   Arthritis    "knees, toes, hands, probably in my back" (03/09/2018)   Ataxia    Basal cell carcinoma    left temporal area-no residual   Colon polyps 10-12-11   past hx.   Coronary artery disease    Exertional angina ([place]) 06/26/2014   Cath with DES to the OM, Bioflow protocol    GERD (gastroesophageal reflux disease)    H/O hiatal hernia    no problems   High cholesterol    History of echocardiogram    Echo 7/19:  Mild LVH, mild focal basal septal hypertrophy, EF  60-65, no RWMA, Gr 1 DD, trivial AI, MAC   Hypertension    Ischemic chest pain (HCC) 08/07/2014   Occasional tremors    Bilateral hands   Peripheral neuropathy    Pneumonia 1940's X 2; ~ 2017   "infant; walking pneumonia" (03/09/2018)   Pseudogout    "it moves around"   Sleep apnea    no cpap used   Type II diabetes mellitus ([place]) dx'd 1982   nsulin started ~ 2000   Vertigo     Past Surgical History:  Procedure Laterality Date   BACK SURGERY     BASAL CELL CARCINOMA EXCISION Left ~ 2012   face   CARDIAC CATHETERIZATION  02/2005   Dr. Mare Ferrari; negative.   CARDIAC CATHETERIZATION N/A 05/13/2016   Procedure: Left Heart Cath and Coronary Angiography;  Surgeon: Jerline Pain, MD;  Location: [place] [facility];  Service: Cardiovascular;  Laterality: N/A;   CARPAL TUNNEL RELEASE Left 10/2009   CARPAL TUNNEL RELEASE Right ~ 2013   CATARACT EXTRACTION W/ INTRAOCULAR LENS IMPLANT Left 2000's   CATARACT EXTRACTION W/ INTRAOCULAR LENS IMPLANT Right 2015   COLONOSCOPY W/ POLYPECTOMY     CORONARY ANGIOPLASTY WITH STENT PLACEMENT  06/26/2014   "1"   CORONARY ANGIOPLASTY WITH STENT PLACEMENT  06/26/2014   pLAD 50%, d LAD 60%, oD1 80%,  mD1  70%, CFX 50%, OM 2 70%,  RCA 80%, PDA 95% (<  28m), OM1 99%-0% with Bio flow stent        CORONARY ANGIOPLASTY WITH STENT PLACEMENT  03/09/2018   CORONARY STENT INTERVENTION N/A 03/09/2018   Procedure: CORONARY STENT INTERVENTION;  Surgeon: MBurnell Blanks MD;  [facility];  Service: Cardiovascular;  Laterality: N/A;   ELBOW SURGERY Bilateral ~ 2014   "for blockages; Dr. GAmedeo Plenty   ENDARTERECTOMY Left 04/23/2015   Procedure: ENDARTERECTOMY CAROTID;  Surgeon: BConrad [place] MD;  [place]  Service: Vascular;  Laterality: Left;   EYE SURGERY Left    "related go diabetes; laser"   KNEE ARTHROSCOPY Left 05/2011   LEFT HEART CATH AND CORONARY ANGIOGRAPHY N/A 03/09/2018   Procedure: LEFT HEART CATH AND CORONARY ANGIOGRAPHY;  Surgeon:  MBurnell Blanks MD;  [facility];  Service: Cardiovascular;  Laterality: N/A;   LEFT HEART CATHETERIZATION WITH CORONARY ANGIOGRAM N/A 06/26/2014   Procedure: LEFT HEART CATHETERIZATION WITH CORONARY ANGIOGRAM;  Surgeon: MBlane Ohara MD;  [facility];  Service: Cardiovascular;  Laterality: N/A;   LEFT HEART CATHETERIZATION WITH CORONARY ANGIOGRAM N/A 08/07/2014   Procedure: LEFT HEART CATHETERIZATION WITH CORONARY ANGIOGRAM;  Surgeon: MBlane Ohara MD;  [facility];  Service: Cardiovascular;  Laterality: N/A;   LUMBAR LAMINECTOMY/DECOMPRESSION MICRODISCECTOMY Left 11/02/2017   Procedure: MICRODISCECTOMY LUMBAR THREE- LUMBAR FOUR, LEFT;  Surgeon: CAshok Pall MD;  [place] [facility]  Service: Neurosurgery;  Laterality: Left;   NASAL SEPTUM SURGERY  1979   PATCH ANGIOPLASTY Left 04/23/2015   Procedure: PATCH ANGIOPLASTY USING 1CM X 6CM XENOSURE BIOLOGIC PATCH;  Surgeon: BConrad [place] MD;  [place] [facility]  Service: Vascular;  Laterality: Left;   [place] 10/14/2011   Procedure: ROTATOR CUFF REPAIR SHOULDER OPEN;  Surgeon: JJohnn Hai MD;  [facility];  Service: Orthopedics;  Laterality: Right;   SUBACROMIAL DECOMPRESSION  10/14/2011   Procedure: SUBACROMIAL DECOMPRESSION;  Surgeon: JJohnn Hai MD;  [facility];  Service: Orthopedics;  Laterality: Right;   TRIGGER FINGER RELEASE Right ~ 2013-2014   "3rd & 4th digits"    Current Medications: Current Meds  Medication Sig   acetaminophen (TYLENOL) 325 MG tablet Take 650 mg by mouth every 6 (six) hours as needed for moderate pain or headache.    aspirin 81 MG tablet Take 1 tablet (81 mg total) by mouth daily.   atorvastatin (LIPITOR) 20 MG tablet Take 20 mg by mouth daily.   augmented betamethasone dipropionate (DIPROLENE-AF) 0.05 % cream as needed for itching.   chlorhexidine (PERIDEX) 0.12 % solution SWISH WITH 1-2 OUNCE  FOR 30 SECONDS THEN SPIT TWICE DAILY as needed for flare up   chlorpheniramine-HYDROcodone (TUSSIONEX) 10-8 MG/5ML SUER as needed for cough.   clopidogrel (PLAVIX) 75 MG tablet Take 1 tablet by mouth once daily   colchicine 0.6 MG tablet Take 0.6 mg by mouth daily as needed (gout).    Cyanocobalamin (B-12) 5000 MCG CAPS Take 5,000 mcg by mouth daily.   dorzolamide-timolol (COSOPT) 22.3-6.8 MG/ML ophthalmic solution Place 1 drop into both eyes 2 (two) times daily.   empagliflozin (JARDIANCE) 25 MG TABS tablet Take 25 mg by mouth daily.   HUMALOG KWIKPEN 200 UNIT/ML SOPN Inject 10-22 Units as directed 3 (three) times daily. SLIDING SCALE   HYDROcodone-acetaminophen (NORCO) 7.5-325 MG tablet as needed for pain.   insulin glargine (LANTUS) 100 UNIT/ML injection Inject 40 Units into the skin  2 (two) times daily. Pt take 40 unit in the morning and 40 unit at bedtime.   Insulin Pen Needle 31G X 8 MM MISC    isosorbide mononitrate (IMDUR) 60 MG 24 hr tablet Take 1 tablet by mouth twice daily   lisinopril (ZESTRIL) 5 MG tablet Take 1 tablet by mouth once daily   LYRICA 100 MG capsule Take 100 mg by mouth 2 (two) times daily as needed. For neuropathic pain   Melatonin 5 MG TABS Take 5 mg by mouth at bedtime.   metFORMIN (GLUCOPHAGE) 1000 MG tablet Take 1,000 mg by mouth 2 (two) times daily with a meal.   methocarbamol (ROBAXIN) 750 MG tablet as needed for pain.   metoprolol tartrate (LOPRESSOR) 100 MG tablet TAKE 1 TABLET BY MOUTH TWICE DAILY . APPOINTMENT REQUIRED FOR FUTURE REFILLS   Multiple Vitamin (MULTI VITAMIN) TABS daily.   nitroGLYCERIN (NITROSTAT) 0.4 MG SL tablet DISSOLVE ONE TABLET UNDER THE TONGUE EVERY 5 MINUTES AS NEEDED FOR CHEST PAIN.   Omega-3 Fatty Acids (FISH OIL PO) Take 520 mg by mouth daily.   OVER THE COUNTER MEDICATION Apply 1 application topically daily as needed (nerve pain). Theraworx otc pain relief foam   pantoprazole (PROTONIX) 40 MG tablet Take 1 tablet (40 mg total) by  mouth 2 (two) times daily. Please make appt. With Dr. Marlou Porch in order to receive future refills. Thank You. 1st Attempt.   Probiotic Product (PROBIOTIC PO) Take 1 tablet by mouth daily.   pyridoxine (B-6) 100 MG tablet Take 100 mg by mouth daily.   triamcinolone cream (KENALOG) 0.1 % Apply 1 application topically daily as needed (lichen planus). With cetaphil     Allergies:   Dulaglutide, Insulin degludec, Gabapentin, Morphine and related, and Naproxen   Social History   Socioeconomic History   Marital status: Married    Spouse name: Not on file   Number of children: 5   Years of education: College   Highest education level: Not on file  Occupational History   Not on file  Tobacco Use   Smoking status: Former    Packs/day: 0.50    Years: 20.00    Total pack years: 10.00    Types: Cigarettes    Quit date: 10/11/1988    Years since quitting: 33.4   Smokeless tobacco: Never  Vaping Use   Vaping Use: Never used  Substance and Sexual Activity   Alcohol use: No    Alcohol/week: 0.0 standard drinks of alcohol    Comment: Quit drinking in 1990   Drug use: No   Sexual activity: Not Currently  Other Topics Concern   Not on file  Social History Narrative   Drinks about 2 cups of coffee a day, occasional diet pepsi.    Social Determinants of Health   Financial Resource Strain: Not on file  Food Insecurity: Not on file  Transportation Needs: Not on file  Physical Activity: Not on file  Stress: Not on file  Social Connections: Not on file     Family History: The patient's family history includes Cancer in his sister; Diabetes in his brother, mother, and sister; Heart attack (age of onset: 38) in his father; Hypertension in his brother and sister; Varicose Veins in his mother and sister.  ROS:   Please see the history of present illness.   All other systems reviewed and are negative.  Labs/Other Studies Reviewed:    The following studies were reviewed today:  LHC  03/2018  Prox RCA lesion  is 30% stenosed. Acute Mrg lesion is 99% stenosed. Mid RCA to Dist RCA lesion is 80% stenosed. Dist RCA lesion is 99% stenosed. Ost Ramus lesion is 50% stenosed. Prox Cx-1 lesion is 50% stenosed. Prox Cx-2 lesion is 50% stenosed. Previously placed Mid Cx stent (unknown type) is widely patent. Ost LAD to Prox LAD lesion is 40% stenosed. Ost 1st Diag to 1st Diag lesion is 90% stenosed. Prox LAD to Mid LAD lesion is 80% stenosed. Mid LAD to Dist LAD lesion is 20% stenosed. A drug-eluting stent was successfully placed using a STENT SIERRA 3.00 X 15 MM. Post intervention, there is a 0% residual stenosis.   1. Diffuse triple vessel CAD 2. The LAD is a large caliber vessel that courses to the apex. The proximal LAD has diffuse moderate disease but no focally obstructive lesions. The mid LAD has an eccentric, focal, hazy stenosis. The distal LAD has mild diffuse disease. The first diagonal branch is a small caliber vessel with diffuse severe disease, unchanged from prior cath and not large enough for PCI.  3. The Circumflex has moderate proximal and mid stenosis. The obtuse marginal stent is patent without restenosis. The moderate to large caliber intermediate branch has moderate proximal stenosis.  4. The RCA is small caliber, non-dominant vessel with diffuse severe stenosis in the small PDA and in the RV marginal branch, unchanged from last cath and too small for PCI.  5. Successful PTCA/DES x 1 mid LAD.    Recommendations: The PDA and the Diagonal are too small for stenting. Both of these branches have chronic lesions and are unchanged since the last cath in 2017. Either branch could cause angina. The LAD was stented today in a hazy mid stenosis. Hopefully this will relieve some of his angina.    Recommend uninterrupted dual antiplatelet therapy with Aspirin 70m daily and Clopidogrel 765mdaily for a minimum of 12 months (ACS - Class I recommendation).  Carotid Duplex  02/2018  Final Interpretation:  Right Carotid: Velocities in the right ICA are consistent with a 1-39%  stenosis.   Left Carotid: Patent left carotid endarterectomy site with evidence of  mild               hyperplasia in the surgical bulb.   Vertebrals:  Bilateral vertebral arteries demonstrate antegrade flow.  Subclavians: Normal flow hemodynamics were seen in bilateral subclavian               arteries.   *See table(s) above for measurements and observations.    Echo 02/16/2018  Left ventricle: The cavity size was normal. Wall thickness was    increased in a pattern of mild LVH. There was mild focal basal    hypertrophy of the septum. Systolic function was normal. The    estimated ejection fraction was in the range of 60% to 65%. Wall    motion was normal; there were no regional wall motion    abnormalities. Doppler parameters are consistent with abnormal    left ventricular relaxation (grade 1 diastolic dysfunction).  - Aortic valve: There was trivial regurgitation.  - Mitral valve: Calcified annulus. Mildly thickened leaflets .   Impressions:   - Normal LV function; mild LVH; mild diastolic dysfunction; trace    AI; redundant MV chordae.   Recent Labs: Total chol 111, LDL 64, HDL 28, Trigs 95 A1C 6.3%, ALT 35, TSH 1.080 Creat 1.3, K 4.5 Hgb 13.9  Risk Assessment/Calculations:      Physical Exam:    VS:  BP 112/62   Pulse 68   Ht 5' 11.5" (1.816 m)   Wt 246 lb (111.6 kg)   SpO2 97%   BMI 33.83 kg/m     Wt Readings from Last 3 Encounters:  04/02/22 246 lb (111.6 kg)  07/07/21 248 lb (112.5 kg)  01/07/21 242 lb (109.8 kg)     GEN:  Well nourished, well developed in no acute distress HEENT: Normal NECK: No JVD; No carotid bruits CARDIAC: RRR, no murmurs, rubs, gallops RESPIRATORY:  Clear to auscultation without rales, wheezing or rhonchi  ABDOMEN: Soft, non-tender, non-distended MUSCULOSKELETAL:  No edema; No deformity. 2+ pedal pulses, equal  bilaterally SKIN: Warm and dry NEUROLOGIC:  Alert and oriented x 3 PSYCHIATRIC:  Normal affect   EKG:  EKG is not ordered today.    Diagnoses:    1. Coronary artery disease involving native coronary artery of native heart without angina pectoris   2. Hyperlipidemia LDL goal <70   3. Essential hypertension   4. Bilateral carotid artery disease, unspecified type ([place])    Assessment and Plan:     CAD without angina: Most recent cath 03/09/2018: Diffuse triple-vessel CAD with PDA and diagonal too small for stenting, chronic lesions unchanged since previous cath 2017, could potentially cause angina.  DES to mid LAD 03/09/18, RCA small caliber, non-dominant vessel with diffuse severe stenosis in small PDA and in RV marginal branch, unchanged from previous cath and too small for PCI.  Circumflex has moderate proximal and mid stenosis.  Obtuse marginal stent is patent without restenosis.  The moderate to large caliber intermediate branch has moderate proximal stenosis.  Over the past 1 year, he has had 2 occasions of symptoms requiring NTG x1 with resolution of discomfort.  No indication of further ischemic evaluation at this time.  No bleeding problems on aspirin and Plavix.  Continue metoprolol, lisinopril, Imdur, Plavix, atorvastatin, Jardiance, aspirin.  Hypertension: BP is well-controlled on current regimen. No changes today. Routine labs with PCP.   Hyperlipidemia LDL goal < 70: LDL 64 on 12/09/21. Continue atorvastatin.   Carotid artery stenosis: History of left CEA 04/2015 by Dr. Bridgett Larsson for ICA stenosis greater than 90%. He has not been seen by [facility] since 2019. He is asymptomatic. No carotid bruit on exam. I reviewed carotid results with him from 2019 carotid study. He will call [facility] regarding follow-up. Advised we will order carotid duplex if needed.     Disposition: 1 year with Dr. Marlou Porch   Medication Adjustments/Labs and Tests Ordered: Current medicines are reviewed at length with the patient  today.  Concerns regarding medicines are outlined above.  No orders of the defined types were placed in this encounter.  No orders of the defined types were placed in this encounter.   There are no Patient Instructions on file for this visit.   Signed, Emmaline Life, NP  04/02/2022 1:40 PM    [facility]

## 2022-04-05 ENCOUNTER — Other Ambulatory Visit: Payer: Self-pay | Admitting: Cardiology

## 2022-04-06 ENCOUNTER — Telehealth: Payer: Self-pay

## 2022-04-06 MED ORDER — METOPROLOL TARTRATE 100 MG PO TABS: 100.0000 mg | ORAL_TABLET | Freq: Two times a day (BID) | ORAL | 2 refills | Status: DC

## 2022-04-06 NOTE — Telephone Encounter (Signed)
Received a call from the patient requesting a refill on his metoprolol. He states he is going out of town and will need a refill before he goes.   Chart reviewed.  Rx(s) sent to pharmacy electronically.  Patient voiced understanding.

## 2022-04-17 DIAGNOSIS — E1129 Type 2 diabetes mellitus with other diabetic kidney complication: Secondary | ICD-10-CM | POA: Diagnosis not present

## 2022-05-08 ENCOUNTER — Other Ambulatory Visit: Payer: Self-pay | Admitting: Cardiology

## 2022-05-08 DIAGNOSIS — R079 Chest pain, unspecified: Secondary | ICD-10-CM

## 2022-05-10 NOTE — Telephone Encounter (Signed)
Pt's pharmacy requesting a refill on pantoprazole. Would Dr. Marlou Porch like to refill this medication? Please address

## 2022-05-12 ENCOUNTER — Encounter (HOSPITAL_BASED_OUTPATIENT_CLINIC_OR_DEPARTMENT_OTHER): Payer: Self-pay | Admitting: Physical Therapy

## 2022-05-12 ENCOUNTER — Other Ambulatory Visit: Payer: Self-pay

## 2022-05-12 ENCOUNTER — Ambulatory Visit (HOSPITAL_BASED_OUTPATIENT_CLINIC_OR_DEPARTMENT_OTHER): Payer: Medicare Other | Attending: Internal Medicine | Admitting: Physical Therapy

## 2022-05-12 DIAGNOSIS — M5440 Lumbago with sciatica, unspecified side: Secondary | ICD-10-CM | POA: Diagnosis not present

## 2022-05-12 DIAGNOSIS — R262 Difficulty in walking, not elsewhere classified: Secondary | ICD-10-CM | POA: Diagnosis not present

## 2022-05-12 DIAGNOSIS — M6281 Muscle weakness (generalized): Secondary | ICD-10-CM

## 2022-05-12 DIAGNOSIS — M5459 Other low back pain: Secondary | ICD-10-CM

## 2022-05-12 NOTE — Therapy (Signed)
OUTPATIENT PHYSICAL THERAPY THORACOLUMBAR EVALUATION   Patient Name: Seth Young MRN: [id number] DOB:1941-02-03, 81 y.o., male Today's Date: 05/13/2022   PT End of Session - 05/12/22 1520     Visit Number 1    Number of Visits 12    Date for PT Re-Evaluation 06/23/22    Authorization Type [employer] MCR    PT Start Time 1638    PT Stop Time 1415    PT Time Calculation (min) 70 min    Activity Tolerance Patient tolerated treatment well    Behavior During Therapy WFL for tasks assessed/performed             Past Medical History:  Diagnosis Date   Arthritis    "knees, toes, hands, probably in my back" (03/09/2018)   Ataxia    Basal cell carcinoma    left temporal area-no residual   Colon polyps 10-12-11   past hx.   Coronary artery disease    Exertional angina 06/26/2014   Cath with DES to the OM, Bioflow protocol    GERD (gastroesophageal reflux disease)    H/O hiatal hernia    no problems   High cholesterol    History of echocardiogram    Echo 7/19:  Mild LVH, mild focal basal septal hypertrophy, EF 60-65, no RWMA, Gr 1 DD, trivial AI, MAC   Hypertension    Ischemic chest pain (HCC) 08/07/2014   Occasional tremors    Bilateral hands   Peripheral neuropathy    Pneumonia 1940's X 2; ~ 2017   "infant; walking pneumonia" (03/09/2018)   Pseudogout    "it moves around"   Sleep apnea    no cpap used   Type II diabetes mellitus ([place]) dx'd 1982   nsulin started ~ 2000   Vertigo    Past Surgical History:  Procedure Laterality Date   BACK SURGERY     BASAL CELL CARCINOMA EXCISION Left ~ 2012   face   CARDIAC CATHETERIZATION  02/2005   Dr. Mare Ferrari; negative.   CARDIAC CATHETERIZATION N/A 05/13/2016   Procedure: Left Heart Cath and Coronary Angiography;  Surgeon: Jerline Pain, MD;  Location: [facility];  Service: Cardiovascular;  Laterality: N/A;   CARPAL TUNNEL RELEASE Left 10/2009   CARPAL TUNNEL RELEASE Right ~ 2013   CATARACT EXTRACTION W/ INTRAOCULAR LENS  IMPLANT Left 2000's   CATARACT EXTRACTION W/ INTRAOCULAR LENS IMPLANT Right 2015   COLONOSCOPY W/ POLYPECTOMY     CORONARY ANGIOPLASTY WITH STENT PLACEMENT  06/26/2014   "1"   CORONARY ANGIOPLASTY WITH STENT PLACEMENT  06/26/2014   pLAD 50%, d LAD 60%, oD1 80%,  mD1  70%, CFX 50%, OM 2 70%,  RCA 80%, PDA 95% (<85m), OM1 99%-0% with Bio flow stent        CORONARY ANGIOPLASTY WITH STENT PLACEMENT  03/09/2018   CORONARY STENT INTERVENTION N/A 03/09/2018   Procedure: CORONARY STENT INTERVENTION;  Surgeon: MBurnell Blanks MD;  [facility];  Service: Cardiovascular;  Laterality: N/A;   ELBOW SURGERY Bilateral ~ 2014   "for blockages; Dr. GAmedeo Plenty   ENDARTERECTOMY Left 04/23/2015   Procedure: ENDARTERECTOMY CAROTID;  Surgeon: BConrad Tuolumne MD;  [facility]  Service: Vascular;  Laterality: Left;   EYE SURGERY Left    "related go diabetes; laser"   KNEE ARTHROSCOPY Left 05/2011   LEFT HEART CATH AND CORONARY ANGIOGRAPHY N/A 03/09/2018   Procedure: LEFT HEART CATH AND CORONARY ANGIOGRAPHY;  Surgeon: MBurnell Blanks MD;  Location: [facility];  Service: Cardiovascular;  Laterality: N/A;   LEFT HEART CATHETERIZATION WITH CORONARY ANGIOGRAM N/A 06/26/2014   Procedure: LEFT HEART CATHETERIZATION WITH CORONARY ANGIOGRAM;  Surgeon: Blane Ohara, MD;  Location: [facility];  Service: Cardiovascular;  Laterality: N/A;   LEFT HEART CATHETERIZATION WITH CORONARY ANGIOGRAM N/A 08/07/2014   Procedure: LEFT HEART CATHETERIZATION WITH CORONARY ANGIOGRAM;  Surgeon: Blane Ohara, MD;  Location: [facility];  Service: Cardiovascular;  Laterality: N/A;   LUMBAR LAMINECTOMY/DECOMPRESSION MICRODISCECTOMY Left 11/02/2017   Procedure: MICRODISCECTOMY LUMBAR THREE- LUMBAR FOUR, LEFT;  Surgeon: Ashok Pall, MD;  Location: [facility];  Service: Neurosurgery;  Laterality: Left;   NASAL SEPTUM SURGERY  1979   PATCH ANGIOPLASTY Left 04/23/2015   Procedure: PATCH ANGIOPLASTY USING 1CM  X 6CM XENOSURE BIOLOGIC PATCH;  Surgeon: Conrad North Powder, MD;  Location: [place];  Service: Vascular;  Laterality: Left;   [place]  10/14/2011   Procedure: ROTATOR CUFF REPAIR SHOULDER OPEN;  Surgeon: Johnn Hai, MD;  Location: [facility];  Service: Orthopedics;  Laterality: Right;   SUBACROMIAL DECOMPRESSION  10/14/2011   Procedure: SUBACROMIAL DECOMPRESSION;  Surgeon: Johnn Hai, MD;  Location: [facility];  Service: Orthopedics;  Laterality: Right;   TRIGGER FINGER RELEASE Right ~ 2013-2014   "3rd & 4th digits"   Patient Active Problem List   Diagnosis Date Noted   Coronary artery disease involving native coronary artery of native heart with angina pectoris ([place]) 03/07/2018   HNP (herniated nucleus pulposus), lumbar 11/02/2017   Carotid artery disease without cerebral infarction ([place]) 04/09/2015   Chest pain of uncertain etiology 17/00/1749   Diabetic neuropathy ([place]) 06/21/2014   Essential hypertension 06/21/2014   Hypercholesterolemia 06/21/2014   Rotator cuff tear 10/15/2011   IDDM (insulin dependent diabetes mellitus) 10/16/2008    PCP: Burnard Bunting, MD  REFERRING PROVIDER: Burnard Bunting, MD   REFERRING DIAG: M54.40 (ICD-10-CM) - Lumbago with sciatica, unspecified side   Rationale for Evaluation and Treatment Rehabilitation  THERAPY DIAG:  Muscle weakness (generalized)  Other low back pain  Difficulty in walking, not elsewhere classified  ONSET DATE: PT order 04/21/2022 / Surgery March 2022  SUBJECTIVE:                                                                                                                                                                                           SUBJECTIVE STATEMENT: Pt has a hx of 3 lumbar surgeries.  His first surgery was in 1964 and his back did well until 2019.  He began having pain in  2019 with no specific MOI.  His surgery in 2019 did not improve his sx's.  He then  had bulging discs and had another lumbar surgery in 2022.  Pt reports having improved pain though continued to have mobility deficits and weakness.   Pt has a pool at home and tried to perform some aquatic exercises.  Pt uses a TM and recumbent bike at home also.   Pt has difficulty and has to use Ue's to perform sit/stand transfers including toilet transfers.  Pt is limited with ambulation distance and has decreased tolerance to activity.  Pt is ambulating with cane.  Pt has difficulty with stairs.  Pt is limited with standing duration.  He leans on counter to dry himself after shower.  Pt uses a W/C when in airport.  Pt states his legs are getting weaker.  He also reports weakness in his back.  Pt has numbness in bilat feet when taking Lyrica.    PERTINENT HISTORY:  -3 back surgeries:  L3-5 laminotomy and pt thinks he has some screws in lumbar March 2022, Lumbar laminectomy/decompression/microdisctomy in 2019, Lumbar surgery in 1964  -OA, Bilat knee pain R > L, CAD, Peripheral neuropathy, Vertigo, Pseudogout, DM type II and diabetic retinopathy,   -Pt has Insulin monitor and tremors in bilat Ue's R > L  -PSHx:  R shoulder surgery in 2013 and was unable to repair rotator cuff, Coronary angioplasty with stent placement in 2015 and 2019, and L knee arthroscopy in 2012    PAIN:  NPRS:  0/10 current, 5/10 worst Location:  bilat lumbar flanks Type:  more discomfort than pain   PRECAUTIONS: Other: multiple back surgeries, peripheral neuropathy, R shoulder RCR  WEIGHT BEARING RESTRICTIONS No  FALLS:  Has patient fallen in last 6 months? No  LIVING ENVIRONMENT: Lives with: lives with their spouse Lives in: 1 story home Stairs: 3-4 steps with rail Has following equipment at home: Single point cane, Environmental consultant - 2 wheeled, and Crutches  OCCUPATION: Pt works from home and sets his own hours.  PLOF: Independent; Pt began using SPC in early 2019.  Pt was able to perform daily mobility and daily  activities with greater ease and less difficulty  PATIENT GOALS improve mobility, get rid of cane, improve strength   OBJECTIVE:   DIAGNOSTIC FINDINGS:  FINDINGS: Segmentation:  Standard.   Alignment:  Trace anterolisthesis L3 on L4.   Vertebrae: No fracture, evidence of discitis, or bone lesion. There is some degenerative endplate signal change at L3-4.   Conus medullaris and cauda equina: Conus extends to the T12 level. Conus and cauda equina appear normal.   Paraspinal and other soft tissues: Negative.   Disc levels:   T11-12 and T12-L1 are imaged in the sagittal plane only and negative.   L1-2: Minimal disc bulge without stenosis.   L2-3: Shallow disc bulge and mild ligamentum flavum thickening. Mild central canal and left foraminal narrowing appear unchanged. The right foramen is open.   L3-4: Status post left laminotomy. There has been progression of disease at this level. A new and very large central left paracentral disc protrusion extends into the left foramen. There is severe compression of the thecal sac and narrowing in the lateral recesses, worse on the left. Moderate bilateral foraminal narrowing is worse on the left.   L4-5: Status post left laminotomy as seen on the prior exam. There is a shallow broad-based disc bulge. The narrowing in the subarticular recesses is present which could impact either descending L5  root. Mild bilateral foraminal narrowing. No change.   L5-S1: A right subarticular recess protrusion impinging on the right S1 root appears unchanged. Foramina are open.   IMPRESSION: Since the prior MRI, the patient has developed a large central and left paracentral disc protrusion at L3-4 causing severe compression of the thecal sac and narrowing in the lateral recesses, worse on the left. Moderate bilateral foraminal narrowing at this level is worse on the left. Prior left laminotomy noted.   No change in a broad-based disc bulge at  L4-5 causing narrowing in the subarticular recesses which could impact either descending L5 root. Left laminotomy defect is seen at this level.   No change in a right subarticular recess protrusion at L5-S1. The disc appears to impinge on the right S1 root.  PATIENT SURVEYS:  FOTO 42 with a goal of 48 at visit #13    COGNITION:  Overall cognitive status: Within functional limits for tasks assessed      MUSCLE LENGTH: Hamstrings: tightness in bilat HS  PALPATION: No tenderness with palpation of bilat lumbar paraspinals and lumbar SP's  LUMBAR ROM:   Active  A/PROM  eval  Flexion   Extension   Right lateral flexion WFL with pressure, no pain  Left lateral flexion WFL with pressure, no pain  Right rotation WFL  Left rotation WFL   (Blank rows = not tested)  LOWER EXTREMITY MMT:    MMT and HHD Right eval Left eval  Hip flexion 4+/5 ; 21.2 4/5 ; 19.8  Hip extension    Hip abduction 22.0 21.8  Hip adduction    Hip internal rotation    Hip external rotation 4+/5 4+/5  Knee flexion 5/5 tested in sitting 4/5 tested in sitting  Knee extension 5/5 5/5  Ankle dorsiflexion 5/5 5/5  Ankle plantarflexion Able to tolerate resistance though weak in sitting Able to tolerate resistance though weak in sitting  Ankle inversion    Ankle eversion     (Blank rows = not tested)  LUMBAR SPECIAL TESTS:  Straight leg raise test: Negative; Bilat   GAIT: Assistive device utilized: Single point cane on R  Level of assistance: Complete Independence Comments: slow gait, decreased step length bilat, increased Wb'ing thru cane, bilat to out R > L    TODAY'S TREATMENT  Pt was educated in the performance and palpation of TrA contraction.  Pt performed supine TrA contractions with 5 sec hold, supine march with TrA, and hooklying clamshells with GTB with TrA.  Pt received a HEP handout and was educated in correct form and appropriate frequency.   PT showed pt the pool and educated pt on  the aquatic process.  PT educated pt in the properties, benefits, and purpose of aquatic therapy.   PT answered pt's questions.     PATIENT EDUCATION:  Education details: Aquatic therapy process, HEP, POC, rationale of exercises, objective findings, relevant anatomy, and dx.   Person educated: Patient Education method: Explanation, Demonstration, Tactile cues, Verbal cues, and Handouts Education comprehension: verbalized understanding, returned demonstration, verbal cues required, tactile cues required, and needs further education   HOME EXERCISE PROGRAM: Access Code: [email or web address] URL: [email or web address] Date: 05/12/2022 Prepared by: Ronny Flurry  Exercises - Supine Transversus Abdominis Bracing - Hands on Stomach  - 2 x daily - 7 x weekly - 2 sets - 10 reps - 5 seconds hold - Supine March  - 1 x daily - 7 x weekly - 2 sets - 10 reps - Hooklying Clamshell with Resistance  -  1 x daily - 4-5 x weekly - 2 sets - 10 reps  ASSESSMENT:  CLINICAL IMPRESSION: Patient is a 81 y.o. male with a dx of lumbago with sciatica presenting to the clinic with muscle weakness, LBP, and difficulty in walking.   Pt has a hx of 3 lumbar surgeries with his last surgery being in March 2022.  Pt reports having improved pain though continued to have mobility deficits and weakness.  Pt has difficulty and limitations with functional mobility skills including transfers, ambulation, and stairs.  Pt has decreased tolerance to activity and is limited with standing duration.  He is ambulating with a cane.  He should benefit from skilled PT services to address impairments and improve overall function.     OBJECTIVE IMPAIRMENTS Abnormal gait, decreased activity tolerance, decreased endurance, decreased mobility, difficulty walking, decreased strength, impaired flexibility, and pain.   ACTIVITY LIMITATIONS standing, squatting, stairs, transfers, bed mobility, and locomotion level  PARTICIPATION  LIMITATIONS: meal prep, cleaning, and community activity  PERSONAL FACTORS 3+ comorbidities: multiple back surgeries, OA, Bilat knee pain R > L, Peripheral neuropathy, Pseudogout, DM type II, and L knee scope   are also affecting patient's functional outcome.   REHAB POTENTIAL: Good  CLINICAL DECISION MAKING: Evolving/moderate complexity  EVALUATION COMPLEXITY: Moderate   GOALS:   SHORT TERM GOALS: Target date: 06/02/2022  Pt will be independent with HEP for improved pain, strength, and function.  Baseline: Goal status: INITIAL  2.  Pt will tolerate aquatic therapy without adverse effects for improved strength and tolerance to activity.  Baseline:  Goal status: INITIAL Target date: 05/26/2022  3.  Pt will demo improved gait speed and increased step length.  Baseline:  Goal status: INITIAL  4.  Pt will report at least a 25% improvement in pain and tolerance to daily mobility.  Baseline:  Goal status: INITIAL    LONG TERM GOALS: Target date: 06/23/2022  Pt will be able to perform his daily transfers without difficultly.  Baseline:  Goal status: INITIAL  2.  Pt will demo at least a 6-8 lb increase in bilat hip flexion and hip abd strength and L knee flexion to 5/5 MMT for improved performance of functional mobility skills including ambulation, stairs, and transfers.  Baseline:  Goal status: INITIAL  3.  Pt will report improved standing tolerance with ADLs/IADLs including preparing food and drying off after a shower.   Baseline:  Goal status: INITIAL  4.  Pt will report improved tolerance with community ambulation and the ability to ambulate his normal distance without significant difficulty and pain.   Baseline:  Goal status: INITIAL   PLAN: PT FREQUENCY: 2x/week  PT DURATION: 6 weeks  PLANNED INTERVENTIONS: Therapeutic exercises, Therapeutic activity, Neuromuscular re-education, Balance training, Gait training, Patient/Family education, Self Care, Joint  mobilization, Stair training, Aquatic Therapy, Dry Needling, Electrical stimulation, Cryotherapy, Moist heat, Taping, Manual therapy, and Re-evaluation.  PLAN FOR NEXT SESSION: Land therapy next visit and aquatic therapy to follow.  Core and LE strengthening. Perform Nu-step next visit.    Selinda Michaels III PT, DPT 05/13/22 5:28 PM

## 2022-05-17 DIAGNOSIS — E1129 Type 2 diabetes mellitus with other diabetic kidney complication: Secondary | ICD-10-CM | POA: Diagnosis not present

## 2022-05-26 ENCOUNTER — Encounter (HOSPITAL_BASED_OUTPATIENT_CLINIC_OR_DEPARTMENT_OTHER): Payer: Self-pay | Admitting: Physical Therapy

## 2022-05-26 ENCOUNTER — Ambulatory Visit (HOSPITAL_BASED_OUTPATIENT_CLINIC_OR_DEPARTMENT_OTHER): Payer: Medicare Other | Admitting: Physical Therapy

## 2022-05-26 DIAGNOSIS — M5440 Lumbago with sciatica, unspecified side: Secondary | ICD-10-CM | POA: Diagnosis not present

## 2022-05-26 DIAGNOSIS — R262 Difficulty in walking, not elsewhere classified: Secondary | ICD-10-CM

## 2022-05-26 DIAGNOSIS — M6281 Muscle weakness (generalized): Secondary | ICD-10-CM

## 2022-05-26 DIAGNOSIS — M5459 Other low back pain: Secondary | ICD-10-CM

## 2022-05-26 NOTE — Therapy (Signed)
OUTPATIENT PHYSICAL THERAPY THORACOLUMBAR EVALUATION   Patient Name: Seth Young MRN: [id number] DOB:[birth year], 81 y.o., male Today's Date: 05/27/2022   PT End of Session - 05/26/22 1322     Visit Number 2    Number of Visits 12    Date for PT Re-Evaluation 06/23/22    Authorization Type [employer] MCR    PT Start Time 1301    PT Stop Time 1344    PT Time Calculation (min) 43 min    Activity Tolerance Patient tolerated treatment well    Behavior During Therapy WFL for tasks assessed/performed              Past Medical History:  Diagnosis Date   Arthritis    "knees, toes, hands, probably in my back" (03/09/2018)   Ataxia    Basal cell carcinoma    left temporal area-no residual   Colon polyps 10-12-11   past hx.   Coronary artery disease    Exertional angina 06/26/2014   Cath with DES to the OM, Bioflow protocol    GERD (gastroesophageal reflux disease)    H/O hiatal hernia    no problems   High cholesterol    History of echocardiogram    Echo 7/19:  Mild LVH, mild focal basal septal hypertrophy, EF 60-65, no RWMA, Gr 1 DD, trivial AI, MAC   Hypertension    Ischemic chest pain (HCC) 08/07/2014   Occasional tremors    Bilateral hands   Peripheral neuropathy    Pneumonia 1940's X 2; ~ 2017   "infant; walking pneumonia" (03/09/2018)   Pseudogout    "it moves around"   Sleep apnea    no cpap used   Type II diabetes mellitus ([place]) dx'd 1982   nsulin started ~ 2000   Vertigo    Past Surgical History:  Procedure Laterality Date   BACK SURGERY     BASAL CELL CARCINOMA EXCISION Left ~ 2012   face   CARDIAC CATHETERIZATION  02/2005   Dr. Mare Ferrari; negative.   CARDIAC CATHETERIZATION N/A 05/13/2016   Procedure: Left Heart Cath and Coronary Angiography;  Surgeon: Jerline Pain, MD;  Location: [facility];  Service: Cardiovascular;  Laterality: N/A;   CARPAL TUNNEL RELEASE Left 10/2009   CARPAL TUNNEL RELEASE Right ~ 2013   CATARACT EXTRACTION W/ INTRAOCULAR LENS  IMPLANT Left 2000's   CATARACT EXTRACTION W/ INTRAOCULAR LENS IMPLANT Right 2015   COLONOSCOPY W/ POLYPECTOMY     CORONARY ANGIOPLASTY WITH STENT PLACEMENT  06/26/2014   "1"   CORONARY ANGIOPLASTY WITH STENT PLACEMENT  06/26/2014   pLAD 50%, d LAD 60%, oD1 80%,  mD1  70%, CFX 50%, OM 2 70%,  RCA 80%, PDA 95% (<9m), OM1 99%-0% with Bio flow stent        CORONARY ANGIOPLASTY WITH STENT PLACEMENT  03/09/2018   CORONARY STENT INTERVENTION N/A 03/09/2018   Procedure: CORONARY STENT INTERVENTION;  Surgeon: MBurnell Blanks MD;  [facility];  Service: Cardiovascular;  Laterality: N/A;   ELBOW SURGERY Bilateral ~ 2014   "for blockages; Dr. GAmedeo Plenty   ENDARTERECTOMY Left 04/23/2015   Procedure: ENDARTERECTOMY CAROTID;  Surgeon: BConrad Pinopolis MD;  [facility] [facility]  Service: Vascular;  Laterality: Left;   EYE SURGERY Left    "related go diabetes; laser"   KNEE ARTHROSCOPY Left 05/2011   LEFT HEART CATH AND CORONARY ANGIOGRAPHY N/A 03/09/2018   Procedure: LEFT HEART CATH AND CORONARY ANGIOGRAPHY;  Surgeon: MBurnell Blanks MD;  Location: [facility];  Service: Cardiovascular;  Laterality: N/A;   LEFT HEART CATHETERIZATION WITH CORONARY ANGIOGRAM N/A 06/26/2014   Procedure: LEFT HEART CATHETERIZATION WITH CORONARY ANGIOGRAM;  Surgeon: Blane Ohara, MD;  Location: [facility];  Service: Cardiovascular;  Laterality: N/A;   LEFT HEART CATHETERIZATION WITH CORONARY ANGIOGRAM N/A 08/07/2014   Procedure: LEFT HEART CATHETERIZATION WITH CORONARY ANGIOGRAM;  Surgeon: Blane Ohara, MD;  Location: [facility] LAB;  Service: Cardiovascular;  Laterality: N/A;   LUMBAR LAMINECTOMY/DECOMPRESSION MICRODISCECTOMY Left 11/02/2017   Procedure: MICRODISCECTOMY LUMBAR THREE- LUMBAR FOUR, LEFT;  Surgeon: Ashok Pall, MD;  Location: [place];  Service: Neurosurgery;  Laterality: Left;   NASAL SEPTUM SURGERY  1979   PATCH ANGIOPLASTY Left 04/23/2015   Procedure: PATCH ANGIOPLASTY USING 1CM  X 6CM XENOSURE BIOLOGIC PATCH;  Surgeon: Conrad Moline, MD;  Location: [place];  Service: Vascular;  Laterality: Left;   [place]  10/14/2011   Procedure: ROTATOR CUFF REPAIR SHOULDER OPEN;  Surgeon: Johnn Hai, MD;  Location: [facility];  Service: Orthopedics;  Laterality: Right;   SUBACROMIAL DECOMPRESSION  10/14/2011   Procedure: SUBACROMIAL DECOMPRESSION;  Surgeon: Johnn Hai, MD;  Location: [facility];  Service: Orthopedics;  Laterality: Right;   TRIGGER FINGER RELEASE Right ~ 2013-2014   "3rd & 4th digits"   Patient Active Problem List   Diagnosis Date Noted   Coronary artery disease involving native coronary artery of native heart with angina pectoris ([place]) 03/07/2018   HNP (herniated nucleus pulposus), lumbar 11/02/2017   Carotid artery disease without cerebral infarction ([place]) 04/09/2015   Chest pain of uncertain etiology 66/01/3015   Diabetic neuropathy ([place]) 06/21/2014   Essential hypertension 06/21/2014   Hypercholesterolemia 06/21/2014   Rotator cuff tear 10/15/2011   IDDM (insulin dependent diabetes mellitus) 10/16/2008    PCP: Burnard Bunting, MD  REFERRING PROVIDER: Burnard Bunting, MD   REFERRING DIAG: M54.40 (ICD-10-CM) - Lumbago with sciatica, unspecified side   Rationale for Evaluation and Treatment Rehabilitation  THERAPY DIAG:  Muscle weakness (generalized)  Other low back pain  Difficulty in walking, not elsewhere classified  ONSET DATE: PT order 04/21/2022 / Surgery March 2022  SUBJECTIVE:                                                                                                                                                                                           SUBJECTIVE STATEMENT: Pt has a hx of 3 lumbar surgeries.  His first surgery was in 1964 and his back did well until 2019.  He began having pain in  2019 with no specific MOI.  His surgery in 2019 did not improve his sx's.  He then  had bulging discs and had another lumbar surgery in 2022.  Pt reports having improved pain though continued to have mobility deficits and weakness.   Pt reports his achilles tendons have bothered him over the past 3 days, worse on R.  He also has had a flare up of neuropathy and increased his meds which can make him lightheaded.  Pt denies any adverse effects after prior Rx and thinks it was helpful.  Pt states he is going to stick with land and hold off on the pool for a little while due to difficulty with drying off.  Pt reports compliance with HEP.   Pt has difficulty with performing transfers and is limited with ambulation distance and standing duration.  Pt has weakness in legs and back.  Pt ambulates with cane and has difficulty with stairs.      PERTINENT HISTORY:  -3 back surgeries:  L3-5 laminotomy and pt thinks he has some screws in lumbar March 2022, Lumbar laminectomy/decompression/microdisctomy in 2019, Lumbar surgery in 1964  -OA, Bilat knee pain R > L, CAD, Peripheral neuropathy, Vertigo, Pseudogout, DM type II and diabetic retinopathy,   -Pt has Insulin monitor and tremors in bilat Ue's R > L  -PSHx:  R shoulder surgery in 2013 and was unable to repair rotator cuff, Coronary angioplasty with stent placement in 2015 and 2019, and L knee arthroscopy in 2012    PAIN:  NPRS:  0/10 current, 5/10 worst Location:  bilat lumbar flanks Type:  more discomfort than pain   PRECAUTIONS: Other: multiple back surgeries, peripheral neuropathy, R shoulder RCR  WEIGHT BEARING RESTRICTIONS No  FALLS:  Has patient fallen in last 6 months? No  LIVING ENVIRONMENT: Lives with: lives with their spouse Lives in: 1 story home Stairs: 3-4 steps with rail Has following equipment at home: Single point cane, Environmental consultant - 2 wheeled, and Crutches  OCCUPATION: Pt works from home and sets his own hours.  PLOF: Independent; Pt began using SPC in early 2019.  Pt was able to perform daily mobility and  daily activities with greater ease and less difficulty  PATIENT GOALS improve mobility, get rid of cane, improve strength   OBJECTIVE:   DIAGNOSTIC FINDINGS:  FINDINGS: Segmentation:  Standard.   Alignment:  Trace anterolisthesis L3 on L4.   Vertebrae: No fracture, evidence of discitis, or bone lesion. There is some degenerative endplate signal change at L3-4.   Conus medullaris and cauda equina: Conus extends to the T12 level. Conus and cauda equina appear normal.   Paraspinal and other soft tissues: Negative.   Disc levels:   T11-12 and T12-L1 are imaged in the sagittal plane only and negative.   L1-2: Minimal disc bulge without stenosis.   L2-3: Shallow disc bulge and mild ligamentum flavum thickening. Mild central canal and left foraminal narrowing appear unchanged. The right foramen is open.   L3-4: Status post left laminotomy. There has been progression of disease at this level. A new and very large central left paracentral disc protrusion extends into the left foramen. There is severe compression of the thecal sac and narrowing in the lateral recesses, worse on the left. Moderate bilateral foraminal narrowing is worse on the left.   L4-5: Status post left laminotomy as seen on the prior exam. There is a shallow broad-based disc bulge. The narrowing in the subarticular recesses is present which could impact either descending  L5 root. Mild bilateral foraminal narrowing. No change.   L5-S1: A right subarticular recess protrusion impinging on the right S1 root appears unchanged. Foramina are open.   IMPRESSION: Since the prior MRI, the patient has developed a large central and left paracentral disc protrusion at L3-4 causing severe compression of the thecal sac and narrowing in the lateral recesses, worse on the left. Moderate bilateral foraminal narrowing at this level is worse on the left. Prior left laminotomy noted.   No change in a broad-based disc bulge  at L4-5 causing narrowing in the subarticular recesses which could impact either descending L5 root. Left laminotomy defect is seen at this level.   No change in a right subarticular recess protrusion at L5-S1. The disc appears to impinge on the right S1 root.     TODAY'S TREATMENT  Reviewed response to prior Rx, current function, HEP compliance, and pain level. Pt was educated in the performance and palpation of TrA contraction.  PT reviewed and updated HEP. Pt performed:  supine TrA contractions with and without 5 sec hold  supine march with TrA 4x5 reps hooklying clamshells with GTB with TrA 2x10  Supine SLR with TrA 2x10 Supine alt UE/LE 2x10 S/L clam 3x10 Seated HS stretch Nustep just LE's L3 x 5 mins   Pt received a HEP handout and was educated in correct form and appropriate frequency.   PT answered pt's questions.     PATIENT EDUCATION:  Education details:  Reviewed and updated HEP.  POC, rationale of exercises, exercise form, relevant anatomy, and dx.   Person educated: Patient Education method: Explanation, Demonstration, Tactile cues, Verbal cues, and Handouts Education comprehension: verbalized understanding, returned demonstration, verbal cues required, tactile cues required, and needs further education   HOME EXERCISE PROGRAM: Access Code: [email or web address] URL: [email or web address] Date: 05/12/2022 Prepared by: Ronny Flurry  Exercises - Supine Transversus Abdominis Bracing - Hands on Stomach  - 2 x daily - 7 x weekly - 2 sets - 10 reps - 5 seconds hold - Supine March  - 1 x daily - 7 x weekly - 2 sets - 10 reps - Hooklying Clamshell with Resistance  - 1 x daily - 4-5 x weekly - 2 sets - 10 reps  Updated HEP:  - Supine Active Straight Leg Raise  - 1 x daily - 5-6 x weekly - 2 sets - 10 reps - Clamshell  - 1 x daily - 5-6 x weekly - 2 sets - 10 reps  ASSESSMENT:  CLINICAL IMPRESSION: Patient is compliant with HEP.  PT provided cuing for TrA  contraction and pt performed well.  PT reviewed HEP, pt performed HEP, and PT updated HEP.  PT progressed core and LE exercises and pt performed well with instruction and cuing for correct form.  He wants to wait on aquatic therapy due to having difficulty with drying off and changing after the pool.  Pt responded well to Rx having no increased pain after Rx.  He should benefit from skilled PT services to address impairments and improve overall function.     OBJECTIVE IMPAIRMENTS Abnormal gait, decreased activity tolerance, decreased endurance, decreased mobility, difficulty walking, decreased strength, impaired flexibility, and pain.   ACTIVITY LIMITATIONS standing, squatting, stairs, transfers, bed mobility, and locomotion level  PARTICIPATION LIMITATIONS: meal prep, cleaning, and community activity  PERSONAL FACTORS 3+ comorbidities: multiple back surgeries, OA, Bilat knee pain R > L, Peripheral neuropathy, Pseudogout, DM type II, and L knee scope   are also affecting patient's functional  outcome.   REHAB POTENTIAL: Good  CLINICAL DECISION MAKING: Evolving/moderate complexity  EVALUATION COMPLEXITY: Moderate   GOALS:   SHORT TERM GOALS: Target date: 06/02/2022  Pt will be independent with HEP for improved pain, strength, and function.  Baseline: Goal status: INITIAL  2.  Pt will tolerate aquatic therapy without adverse effects for improved strength and tolerance to activity.  Baseline:  Goal status: INITIAL Target date: 05/26/2022  3.  Pt will demo improved gait speed and increased step length.  Baseline:  Goal status: INITIAL  4.  Pt will report at least a 25% improvement in pain and tolerance to daily mobility.  Baseline:  Goal status: INITIAL    LONG TERM GOALS: Target date: 06/23/2022  Pt will be able to perform his daily transfers without difficultly.  Baseline:  Goal status: INITIAL  2.  Pt will demo at least a 6-8 lb increase in bilat hip flexion and hip abd  strength and L knee flexion to 5/5 MMT for improved performance of functional mobility skills including ambulation, stairs, and transfers.  Baseline:  Goal status: INITIAL  3.  Pt will report improved standing tolerance with ADLs/IADLs including preparing food and drying off after a shower.   Baseline:  Goal status: INITIAL  4.  Pt will report improved tolerance with community ambulation and the ability to ambulate his normal distance without significant difficulty and pain.   Baseline:  Goal status: INITIAL   PLAN: PT FREQUENCY: 2x/week  PT DURATION: 6 weeks  PLANNED INTERVENTIONS: Therapeutic exercises, Therapeutic activity, Neuromuscular re-education, Balance training, Gait training, Patient/Family education, Self Care, Joint mobilization, Stair training, Aquatic Therapy, Dry Needling, Electrical stimulation, Cryotherapy, Moist heat, Taping, Manual therapy, and Re-evaluation.  PLAN FOR NEXT SESSION:   Cont with land therapy.  Core and LE strengthening.    Selinda Michaels III PT, DPT 05/27/22 8:47 PM

## 2022-06-02 ENCOUNTER — Ambulatory Visit (HOSPITAL_BASED_OUTPATIENT_CLINIC_OR_DEPARTMENT_OTHER): Payer: Medicare Other | Admitting: Physical Therapy

## 2022-06-02 ENCOUNTER — Encounter (HOSPITAL_BASED_OUTPATIENT_CLINIC_OR_DEPARTMENT_OTHER): Payer: Self-pay | Admitting: Physical Therapy

## 2022-06-02 DIAGNOSIS — R262 Difficulty in walking, not elsewhere classified: Secondary | ICD-10-CM

## 2022-06-02 DIAGNOSIS — M5440 Lumbago with sciatica, unspecified side: Secondary | ICD-10-CM | POA: Diagnosis not present

## 2022-06-02 DIAGNOSIS — M6281 Muscle weakness (generalized): Secondary | ICD-10-CM

## 2022-06-02 DIAGNOSIS — M5459 Other low back pain: Secondary | ICD-10-CM

## 2022-06-02 NOTE — Therapy (Signed)
OUTPATIENT PHYSICAL THERAPY THORACOLUMBAR EVALUATION   Patient Name: Seth Young MRN: [id number] DOB:09-Nov-1940, 81 y.o., male Today's Date: 06/02/2022      Past Medical History:  Diagnosis Date   Arthritis    "knees, toes, hands, probably in my back" (03/09/2018)   Ataxia    Basal cell carcinoma    left temporal area-no residual   Colon polyps 10-12-11   past hx.   Coronary artery disease    Exertional angina 06/26/2014   Cath with DES to the OM, Bioflow protocol    GERD (gastroesophageal reflux disease)    H/O hiatal hernia    no problems   High cholesterol    History of echocardiogram    Echo 7/19:  Mild LVH, mild focal basal septal hypertrophy, EF 60-65, no RWMA, Gr 1 DD, trivial AI, MAC   Hypertension    Ischemic chest pain (HCC) 08/07/2014   Occasional tremors    Bilateral hands   Peripheral neuropathy    Pneumonia 1940's X 2; ~ 2017   "infant; walking pneumonia" (03/09/2018)   Pseudogout    "it moves around"   Sleep apnea    no cpap used   Type II diabetes mellitus ([place]) dx'd 1982   nsulin started ~ 2000   Vertigo    Past Surgical History:  Procedure Laterality Date   BACK SURGERY     BASAL CELL CARCINOMA EXCISION Left ~ 2012   face   CARDIAC CATHETERIZATION  02/2005   Dr. Mare Ferrari; negative.   CARDIAC CATHETERIZATION N/A 05/13/2016   Procedure: Left Heart Cath and Coronary Angiography;  Surgeon: Jerline Pain, MD;  Location: [facility];  Service: Cardiovascular;  Laterality: N/A;   CARPAL TUNNEL RELEASE Left 10/2009   CARPAL TUNNEL RELEASE Right ~ 2013   CATARACT EXTRACTION W/ INTRAOCULAR LENS IMPLANT Left 2000's   CATARACT EXTRACTION W/ INTRAOCULAR LENS IMPLANT Right 2015   COLONOSCOPY W/ POLYPECTOMY     CORONARY ANGIOPLASTY WITH STENT PLACEMENT  06/26/2014   "1"   CORONARY ANGIOPLASTY WITH STENT PLACEMENT  06/26/2014   pLAD 50%, d LAD 60%, oD1 80%,  mD1  70%, CFX 50%, OM 2 70%,  RCA 80%, PDA 95% (<16m), OM1 99%-0% with Bio flow stent         CORONARY ANGIOPLASTY WITH STENT PLACEMENT  03/09/2018   CORONARY STENT INTERVENTION N/A 03/09/2018   Procedure: CORONARY STENT INTERVENTION;  Surgeon: MBurnell Blanks MD;  [facility];  Service: Cardiovascular;  Laterality: N/A;   ELBOW SURGERY Bilateral ~ 2014   "for blockages; Dr. GAmedeo Plenty   ENDARTERECTOMY Left 04/23/2015   Procedure: ENDARTERECTOMY CAROTID;  Surgeon: BConrad Galesville MD;  [facility]  Service: Vascular;  Laterality: Left;   EYE SURGERY Left    "related go diabetes; laser"   KNEE ARTHROSCOPY Left 05/2011   LEFT HEART CATH AND CORONARY ANGIOGRAPHY N/A 03/09/2018   Procedure: LEFT HEART CATH AND CORONARY ANGIOGRAPHY;  Surgeon: MBurnell Blanks MD;  [facility];  Service: Cardiovascular;  Laterality: N/A;   LEFT HEART CATHETERIZATION WITH CORONARY ANGIOGRAM N/A 06/26/2014   Procedure: LEFT HEART CATHETERIZATION WITH CORONARY ANGIOGRAM;  Surgeon: MBlane Ohara MD;  [facility];  Service: Cardiovascular;  Laterality: N/A;   LEFT HEART CATHETERIZATION WITH CORONARY ANGIOGRAM N/A 08/07/2014   Procedure: LEFT HEART CATHETERIZATION WITH CORONARY ANGIOGRAM;  Surgeon: MBlane Ohara MD;  [facility];  Service: Cardiovascular;  Laterality: N/A;   LUMBAR LAMINECTOMY/DECOMPRESSION  MICRODISCECTOMY Left 11/02/2017   Procedure: MICRODISCECTOMY LUMBAR THREE- LUMBAR FOUR, LEFT;  Surgeon: Ashok Pall, MD;  Location: [facility];  Service: Neurosurgery;  Laterality: Left;   NASAL SEPTUM SURGERY  1979   PATCH ANGIOPLASTY Left 04/23/2015   Procedure: PATCH ANGIOPLASTY USING 1CM X 6CM XENOSURE BIOLOGIC PATCH;  Surgeon: Conrad Normal, MD;  Location: [facility];  Service: Vascular;  Laterality: Left;   [place]  10/14/2011   Procedure: ROTATOR CUFF REPAIR SHOULDER OPEN;  Surgeon: Johnn Hai, MD;  Location: [facility];  Service: Orthopedics;  Laterality: Right;   SUBACROMIAL  DECOMPRESSION  10/14/2011   Procedure: SUBACROMIAL DECOMPRESSION;  Surgeon: Johnn Hai, MD;  Location: [facility];  Service: Orthopedics;  Laterality: Right;   TRIGGER FINGER RELEASE Right ~ 2013-2014   "3rd & 4th digits"   Patient Active Problem List   Diagnosis Date Noted   Coronary artery disease involving native coronary artery of native heart with angina pectoris ([place]) 03/07/2018   HNP (herniated nucleus pulposus), lumbar 11/02/2017   Carotid artery disease without cerebral infarction ([place]) 04/09/2015   Chest pain of uncertain etiology 46/80/3212   Diabetic neuropathy ([place]) 06/21/2014   Essential hypertension 06/21/2014   Hypercholesterolemia 06/21/2014   Rotator cuff tear 10/15/2011   IDDM (insulin dependent diabetes mellitus) 10/16/2008    PCP: Burnard Bunting, MD  REFERRING PROVIDER: Burnard Bunting, MD   REFERRING DIAG: M54.40 (ICD-10-CM) - Lumbago with sciatica, unspecified side   Rationale for Evaluation and Treatment Rehabilitation  THERAPY DIAG:  No diagnosis found.  ONSET DATE: PT order 04/21/2022 / Surgery March 2022  SUBJECTIVE:                                                                                                                                                                                           SUBJECTIVE STATEMENT: Pt has a hx of 3 lumbar surgeries.  His first surgery was in 1964 and his back did well until 2019.  He began having pain in 2019 with no specific MOI.  His surgery in 2019 did not improve his sx's.  He then had bulging discs and had another lumbar surgery in 2022.  Pt reports having improved pain though continued to have mobility deficits and weakness.   Pt reports his achilles tendons have bothered him over the past 3 days, worse on R.  He also has had a flare up of neuropathy and increased his meds which can make him lightheaded.  Pt denies any adverse effects after prior Rx and thinks it was helpful.  Pt states he is going  to stick  with land and hold off on the pool for a little while due to difficulty with drying off.  Pt reports compliance with HEP.   Pt has difficulty with performing transfers and is limited with ambulation distance and standing duration.  Pt has weakness in legs and back.  Pt ambulates with cane and has difficulty with stairs.      "I see improvement".  Pt states he can sometimes get up and walk without a cane.  Pt states he felt good after prior Rx.  Pt reports compliance with HEP.  He has noticed some swelling in R knee.    PERTINENT HISTORY:  -3 back surgeries:  L3-5 laminotomy and pt thinks he has some screws in lumbar March 2022, Lumbar laminectomy/decompression/microdisctomy in 2019, Lumbar surgery in 1964  -OA, Bilat knee pain R > L, CAD, Peripheral neuropathy, Vertigo, Pseudogout, DM type II and diabetic retinopathy,   -Pt has Insulin monitor and tremors in bilat Ue's R > L  -PSHx:  R shoulder surgery in 2013 and was unable to repair rotator cuff, Coronary angioplasty with stent placement in 2015 and 2019, and L knee arthroscopy in 2012    PAIN:  NPRS:  0/10 current, 5/10 worst Location:  bilat lumbar flanks Type:  Pt states he has some tightness and discomfort in lumbar. No pain   PRECAUTIONS: Other: multiple back surgeries, peripheral neuropathy, R shoulder RCR  WEIGHT BEARING RESTRICTIONS No  FALLS:  Has patient fallen in last 6 months? No  LIVING ENVIRONMENT: Lives with: lives with their spouse Lives in: 1 story home Stairs: 3-4 steps with rail Has following equipment at home: Single point cane, Environmental consultant - 2 wheeled, and Crutches  OCCUPATION: Pt works from home and sets his own hours.  PLOF: Independent; Pt began using SPC in early 2019.  Pt was able to perform daily mobility and daily activities with greater ease and less difficulty  PATIENT GOALS improve mobility, get rid of cane, improve strength   OBJECTIVE:   DIAGNOSTIC FINDINGS:  FINDINGS: Segmentation:   Standard.   Alignment:  Trace anterolisthesis L3 on L4.   Vertebrae: No fracture, evidence of discitis, or bone lesion. There is some degenerative endplate signal change at L3-4.   Conus medullaris and cauda equina: Conus extends to the T12 level. Conus and cauda equina appear normal.   Paraspinal and other soft tissues: Negative.   Disc levels:   T11-12 and T12-L1 are imaged in the sagittal plane only and negative.   L1-2: Minimal disc bulge without stenosis.   L2-3: Shallow disc bulge and mild ligamentum flavum thickening. Mild central canal and left foraminal narrowing appear unchanged. The right foramen is open.   L3-4: Status post left laminotomy. There has been progression of disease at this level. A new and very large central left paracentral disc protrusion extends into the left foramen. There is severe compression of the thecal sac and narrowing in the lateral recesses, worse on the left. Moderate bilateral foraminal narrowing is worse on the left.   L4-5: Status post left laminotomy as seen on the prior exam. There is a shallow broad-based disc bulge. The narrowing in the subarticular recesses is present which could impact either descending L5 root. Mild bilateral foraminal narrowing. No change.   L5-S1: A right subarticular recess protrusion impinging on the right S1 root appears unchanged. Foramina are open.   IMPRESSION: Since the prior MRI, the patient has developed a large central and left paracentral disc protrusion at L3-4  causing severe compression of the thecal sac and narrowing in the lateral recesses, worse on the left. Moderate bilateral foraminal narrowing at this level is worse on the left. Prior left laminotomy noted.   No change in a broad-based disc bulge at L4-5 causing narrowing in the subarticular recesses which could impact either descending L5 root. Left laminotomy defect is seen at this level.   No change in a right subarticular recess  protrusion at L5-S1. The disc appears to impinge on the right S1 root.     TODAY'S TREATMENT  Reviewed response to prior Rx, current function, HEP compliance, and pain level. Pt was educated in the performance and palpation of TrA contraction.  PT reviewed and updated HEP. Pt performed:  supine march with TrA 4x5 reps Supine alt LE ext with tapping 3x5 hooklying clamshells with GTB with TrA 2x10  Supine SLR with TrA 2x10 Supine alt UE/LE 2x10 S/L clam 2x10 Seated HS stretch 2x30 sec bilat Nustep just LE's L3 x 5 mins Standing hip abduction 2x10 with UE support on counter   Pt received a HEP handout and was educated in correct form and appropriate frequency.   PT answered pt's questions.     PATIENT EDUCATION:  Education details:  Reviewed and updated HEP.  POC, rationale of exercises, exercise form, relevant anatomy, and dx.   Person educated: Patient Education method: Explanation, Demonstration, Tactile cues, Verbal cues, and Handouts Education comprehension: verbalized understanding, returned demonstration, verbal cues required, tactile cues required, and needs further education   HOME EXERCISE PROGRAM: Access Code: [email or web address] URL: [email or web address] Date: 05/12/2022 Prepared by: Ronny Flurry  Exercises - Supine Transversus Abdominis Bracing - Hands on Stomach  - 2 x daily - 7 x weekly - 2 sets - 10 reps - 5 seconds hold - Supine March  - 1 x daily - 7 x weekly - 2 sets - 10 reps - Hooklying Clamshell with Resistance  - 1 x daily - 4-5 x weekly - 2 sets - 10 reps  Updated HEP:  - Supine Active Straight Leg Raise  - 1 x daily - 5-6 x weekly - 2 sets - 10 reps - Clamshell  - 1 x daily - 5-6 x weekly - 2 sets - 10 reps  ASSESSMENT:  CLINICAL IMPRESSION: Patient is compliant with HEP.  PT provided cuing for TrA contraction and pt performed well.  PT reviewed HEP, pt performed HEP, and PT updated HEP.  PT progressed core and LE exercises and pt  performed well with instruction and cuing for correct form.  He wants to wait on aquatic therapy due to having difficulty with drying off and changing after the pool.  Pt responded well to Rx having no increased pain after Rx.  He should benefit from skilled PT services to address impairments and improve overall function.     OBJECTIVE IMPAIRMENTS Abnormal gait, decreased activity tolerance, decreased endurance, decreased mobility, difficulty walking, decreased strength, impaired flexibility, and pain.   ACTIVITY LIMITATIONS standing, squatting, stairs, transfers, bed mobility, and locomotion level  PARTICIPATION LIMITATIONS: meal prep, cleaning, and community activity  PERSONAL FACTORS 3+ comorbidities: multiple back surgeries, OA, Bilat knee pain R > L, Peripheral neuropathy, Pseudogout, DM type II, and L knee scope   are also affecting patient's functional outcome.   REHAB POTENTIAL: Good  CLINICAL DECISION MAKING: Evolving/moderate complexity  EVALUATION COMPLEXITY: Moderate   GOALS:   SHORT TERM GOALS: Target date: 06/02/2022  Pt will be independent with HEP for improved pain, strength, and  function.  Baseline: Goal status: INITIAL  2.  Pt will tolerate aquatic therapy without adverse effects for improved strength and tolerance to activity.  Baseline:  Goal status: INITIAL Target date: 05/26/2022  3.  Pt will demo improved gait speed and increased step length.  Baseline:  Goal status: INITIAL  4.  Pt will report at least a 25% improvement in pain and tolerance to daily mobility.  Baseline:  Goal status: INITIAL    LONG TERM GOALS: Target date: 06/23/2022  Pt will be able to perform his daily transfers without difficultly.  Baseline:  Goal status: INITIAL  2.  Pt will demo at least a 6-8 lb increase in bilat hip flexion and hip abd strength and L knee flexion to 5/5 MMT for improved performance of functional mobility skills including ambulation, stairs, and  transfers.  Baseline:  Goal status: INITIAL  3.  Pt will report improved standing tolerance with ADLs/IADLs including preparing food and drying off after a shower.   Baseline:  Goal status: INITIAL  4.  Pt will report improved tolerance with community ambulation and the ability to ambulate his normal distance without significant difficulty and pain.   Baseline:  Goal status: INITIAL   PLAN: PT FREQUENCY: 2x/week  PT DURATION: 6 weeks  PLANNED INTERVENTIONS: Therapeutic exercises, Therapeutic activity, Neuromuscular re-education, Balance training, Gait training, Patient/Family education, Self Care, Joint mobilization, Stair training, Aquatic Therapy, Dry Needling, Electrical stimulation, Cryotherapy, Moist heat, Taping, Manual therapy, and Re-evaluation.  PLAN FOR NEXT SESSION:   Cont with land therapy.  Core and LE strengthening.    Selinda Michaels III PT, DPT 06/02/22 1:09 PM

## 2022-06-08 DIAGNOSIS — L439 Lichen planus, unspecified: Secondary | ICD-10-CM | POA: Diagnosis not present

## 2022-06-08 DIAGNOSIS — E1129 Type 2 diabetes mellitus with other diabetic kidney complication: Secondary | ICD-10-CM | POA: Diagnosis not present

## 2022-06-08 DIAGNOSIS — E11319 Type 2 diabetes mellitus with unspecified diabetic retinopathy without macular edema: Secondary | ICD-10-CM | POA: Diagnosis not present

## 2022-06-09 ENCOUNTER — Ambulatory Visit (HOSPITAL_BASED_OUTPATIENT_CLINIC_OR_DEPARTMENT_OTHER): Payer: Medicare Other | Attending: Internal Medicine | Admitting: Physical Therapy

## 2022-06-09 DIAGNOSIS — R262 Difficulty in walking, not elsewhere classified: Secondary | ICD-10-CM | POA: Diagnosis not present

## 2022-06-09 DIAGNOSIS — M6281 Muscle weakness (generalized): Secondary | ICD-10-CM | POA: Diagnosis not present

## 2022-06-09 DIAGNOSIS — M5459 Other low back pain: Secondary | ICD-10-CM | POA: Insufficient documentation

## 2022-06-09 NOTE — Therapy (Signed)
OUTPATIENT PHYSICAL THERAPY  TREATMENT NOTE   Patient Name: Seth Young MRN: [id number] DOB:Apr 23, 1941, 81 y.o., male Today's Date: 06/10/2022   PT End of Session - 06/09/22 1352     Visit Number 4    Number of Visits 12    Date for PT Re-Evaluation 06/23/22    Authorization Type [employer] MCR    PT Start Time 3664    PT Stop Time 1346    PT Time Calculation (min) 41 min    Activity Tolerance Patient tolerated treatment well    Behavior During Therapy WFL for tasks assessed/performed                Past Medical History:  Diagnosis Date   Arthritis    "knees, toes, hands, probably in my back" (03/09/2018)   Ataxia    Basal cell carcinoma    left temporal area-no residual   Colon polyps 10-12-11   past hx.   Coronary artery disease    Exertional angina 06/26/2014   Cath with DES to the OM, Bioflow protocol    GERD (gastroesophageal reflux disease)    H/O hiatal hernia    no problems   High cholesterol    History of echocardiogram    Echo 7/19:  Mild LVH, mild focal basal septal hypertrophy, EF 60-65, no RWMA, Gr 1 DD, trivial AI, MAC   Hypertension    Ischemic chest pain (HCC) 08/07/2014   Occasional tremors    Bilateral hands   Peripheral neuropathy    Pneumonia 1940's X 2; ~ 2017   "infant; walking pneumonia" (03/09/2018)   Pseudogout    "it moves around"   Sleep apnea    no cpap used   Type II diabetes mellitus ([place]) dx'd 1982   nsulin started ~ 2000   Vertigo    Past Surgical History:  Procedure Laterality Date   BACK SURGERY     BASAL CELL CARCINOMA EXCISION Left ~ 2012   face   CARDIAC CATHETERIZATION  02/2005   Dr. Mare Ferrari; negative.   CARDIAC CATHETERIZATION N/A 05/13/2016   Procedure: Left Heart Cath and Coronary Angiography;  Surgeon: Jerline Pain, MD;  Location: [place];  Service: Cardiovascular;  Laterality: N/A;   CARPAL TUNNEL RELEASE Left 10/2009   CARPAL TUNNEL RELEASE Right ~ 2013   CATARACT EXTRACTION W/ INTRAOCULAR LENS  IMPLANT Left 2000's   CATARACT EXTRACTION W/ INTRAOCULAR LENS IMPLANT Right 2015   COLONOSCOPY W/ POLYPECTOMY     CORONARY ANGIOPLASTY WITH STENT PLACEMENT  06/26/2014   "1"   CORONARY ANGIOPLASTY WITH STENT PLACEMENT  06/26/2014   pLAD 50%, d LAD 60%, oD1 80%,  mD1  70%, CFX 50%, OM 2 70%,  RCA 80%, PDA 95% (<55m), OM1 99%-0% with Bio flow stent        CORONARY ANGIOPLASTY WITH STENT PLACEMENT  03/09/2018   CORONARY STENT INTERVENTION N/A 03/09/2018   Procedure: CORONARY STENT INTERVENTION;  Surgeon: MBurnell Blanks MD;  [facility];  Service: Cardiovascular;  Laterality: N/A;   ELBOW SURGERY Bilateral ~ 2014   "for blockages; Dr. GAmedeo Plenty   ENDARTERECTOMY Left 04/23/2015   Procedure: ENDARTERECTOMY CAROTID;  Surgeon: BConrad Millville MD;  [place] [facility]  Service: Vascular;  Laterality: Left;   EYE SURGERY Left    "related go diabetes; laser"   KNEE ARTHROSCOPY Left 05/2011   LEFT HEART CATH AND CORONARY ANGIOGRAPHY N/A 03/09/2018   Procedure: LEFT HEART CATH AND CORONARY ANGIOGRAPHY;  Surgeon: MAngelena Form  Annita Brod, MD;  Location: [place];  Service: Cardiovascular;  Laterality: N/A;   LEFT HEART CATHETERIZATION WITH CORONARY ANGIOGRAM N/A 06/26/2014   Procedure: LEFT HEART CATHETERIZATION WITH CORONARY ANGIOGRAM;  Surgeon: Blane Ohara, MD;  Location: [facility];  Service: Cardiovascular;  Laterality: N/A;   LEFT HEART CATHETERIZATION WITH CORONARY ANGIOGRAM N/A 08/07/2014   Procedure: LEFT HEART CATHETERIZATION WITH CORONARY ANGIOGRAM;  Surgeon: Blane Ohara, MD;  Location: [facility];  Service: Cardiovascular;  Laterality: N/A;   LUMBAR LAMINECTOMY/DECOMPRESSION MICRODISCECTOMY Left 11/02/2017   Procedure: MICRODISCECTOMY LUMBAR THREE- LUMBAR FOUR, LEFT;  Surgeon: Ashok Pall, MD;  Location: [place];  Service: Neurosurgery;  Laterality: Left;   NASAL SEPTUM SURGERY  1979   PATCH ANGIOPLASTY Left 04/23/2015   Procedure: PATCH ANGIOPLASTY USING 1CM  X 6CM XENOSURE BIOLOGIC PATCH;  Surgeon: Conrad Snook, MD;  Location: [place];  Service: Vascular;  Laterality: Left;   [place]  10/14/2011   Procedure: ROTATOR CUFF REPAIR SHOULDER OPEN;  Surgeon: Johnn Hai, MD;  Location: [facility];  Service: Orthopedics;  Laterality: Right;   SUBACROMIAL DECOMPRESSION  10/14/2011   Procedure: SUBACROMIAL DECOMPRESSION;  Surgeon: Johnn Hai, MD;  Location: [facility];  Service: Orthopedics;  Laterality: Right;   TRIGGER FINGER RELEASE Right ~ 2013-2014   "3rd & 4th digits"   Patient Active Problem List   Diagnosis Date Noted   Coronary artery disease involving native coronary artery of native heart with angina pectoris ([place]) 03/07/2018   HNP (herniated nucleus pulposus), lumbar 11/02/2017   Carotid artery disease without cerebral infarction ([place]) 04/09/2015   Chest pain of uncertain etiology 93/26/7124   Diabetic neuropathy ([place]) 06/21/2014   Essential hypertension 06/21/2014   Hypercholesterolemia 06/21/2014   Rotator cuff tear 10/15/2011   IDDM (insulin dependent diabetes mellitus) 10/16/2008    PCP: Burnard Bunting, MD  REFERRING PROVIDER: Burnard Bunting, MD   REFERRING DIAG: M54.40 (ICD-10-CM) - Lumbago with sciatica, unspecified side   Rationale for Evaluation and Treatment Rehabilitation  THERAPY DIAG:  Muscle weakness (generalized)  Other low back pain  Difficulty in walking, not elsewhere classified  ONSET DATE: PT order 04/21/2022 / Surgery March 2022  SUBJECTIVE:                                                                                                                                                                                           SUBJECTIVE STATEMENT: Pt has a hx of 3 lumbar surgeries.  His first surgery was in 1964 and his back did well until 2019.  He  began having pain in 2019 with no specific MOI.  His surgery in 2019 did not improve his sx's.  He then  had bulging discs and had another lumbar surgery in 2022.  Pt reports having improved pain though continued to have mobility deficits and weakness.   "I see improvement".  Pt states he can sometimes get up and walk without a cane.  Pt has difficulty with performing transfers and is limited with ambulation distance and standing duration.  Pt has weakness in legs and back.  Pt ambulates with cane and has difficulty with stairs.     Pt denies any adverse effects after prior Rx.  Pt reports compliance with HEP.  He states he has been having some swelling in R knee and thinks it might be from the exercise where he extends his leg out with TrA.     PERTINENT HISTORY:  -3 back surgeries:  L3-5 laminotomy and pt thinks he has some screws in lumbar March 2022, Lumbar laminectomy/decompression/microdisctomy in 2019, Lumbar surgery in 1964  -OA, Bilat knee pain R > L, CAD, Peripheral neuropathy, Vertigo, Pseudogout, DM type II and diabetic retinopathy,   -Pt has Insulin monitor and tremors in bilat Ue's R > L  -PSHx:  R shoulder surgery in 2013 and was unable to repair rotator cuff, Coronary angioplasty with stent placement in 2015 and 2019, and L knee arthroscopy in 2012    PAIN:  NPRS:  0/10 current, 5/10 worst Location:  bilat lumbar flanks Type:  Pt states he has some tightness and discomfort in lumbar. No pain   PRECAUTIONS: Other: multiple back surgeries, peripheral neuropathy, R shoulder RCR  WEIGHT BEARING RESTRICTIONS No  FALLS:  Has patient fallen in last 6 months? No  LIVING ENVIRONMENT: Lives with: lives with their spouse Lives in: 1 story home Stairs: 3-4 steps with rail Has following equipment at home: Single point cane, Environmental consultant - 2 wheeled, and Crutches  OCCUPATION: Pt works from home and sets his own hours.  PLOF: Independent; Pt began using SPC in early 2019.  Pt was able to perform daily mobility and daily activities with greater ease and less difficulty  PATIENT GOALS  improve mobility, get rid of cane, improve strength   OBJECTIVE:   DIAGNOSTIC FINDINGS:  FINDINGS: Segmentation:  Standard.   Alignment:  Trace anterolisthesis L3 on L4.   Vertebrae: No fracture, evidence of discitis, or bone lesion. There is some degenerative endplate signal change at L3-4.   Conus medullaris and cauda equina: Conus extends to the T12 level. Conus and cauda equina appear normal.   Paraspinal and other soft tissues: Negative.   Disc levels:   T11-12 and T12-L1 are imaged in the sagittal plane only and negative.   L1-2: Minimal disc bulge without stenosis.   L2-3: Shallow disc bulge and mild ligamentum flavum thickening. Mild central canal and left foraminal narrowing appear unchanged. The right foramen is open.   L3-4: Status post left laminotomy. There has been progression of disease at this level. A new and very large central left paracentral disc protrusion extends into the left foramen. There is severe compression of the thecal sac and narrowing in the lateral recesses, worse on the left. Moderate bilateral foraminal narrowing is worse on the left.   L4-5: Status post left laminotomy as seen on the prior exam. There is a shallow broad-based disc bulge. The narrowing in the subarticular recesses is present which could impact either descending L5 root. Mild bilateral foraminal narrowing. No change.  L5-S1: A right subarticular recess protrusion impinging on the right S1 root appears unchanged. Foramina are open.   IMPRESSION: Since the prior MRI, the patient has developed a large central and left paracentral disc protrusion at L3-4 causing severe compression of the thecal sac and narrowing in the lateral recesses, worse on the left. Moderate bilateral foraminal narrowing at this level is worse on the left. Prior left laminotomy noted.   No change in a broad-based disc bulge at L4-5 causing narrowing in the subarticular recesses which could  impact either descending L5 root. Left laminotomy defect is seen at this level.   No change in a right subarticular recess protrusion at L5-S1. The disc appears to impinge on the right S1 root.     TODAY'S TREATMENT  Reviewed response to prior Rx, current function, HEP compliance, and pain level. Pt performed: Nustep just LE's L3 x 5 mins supine march with TrA 2x10 reps Supine alt LE ext with tapping 2x10 Supine heel slides with Tra x 10 hooklying clamshells with GTB with TrA 2x10  Supine SLR with TrA 2x10 Supine alt UE/LE 2x10 S/L clam with RTB 2x10 Standing hip abduction 2x10 with UE support on counter Standing heel raises with TrA 2x10 Sidestepping at counter with UE support x 2 laps Standing on airex without UE support with SBA 2 x 30 seconds.  Pt had 2-3 occasions of UE support     PATIENT EDUCATION:  Education details:  HEP, POC, rationale of exercises, exercise form, relevant anatomy, and dx.  PT answered pt's questions. Person educated: Patient Education method: Explanation, Demonstration, Tactile cues, Verbal cues, and Handouts Education comprehension: verbalized understanding, returned demonstration, verbal cues required, tactile cues required, and needs further education   HOME EXERCISE PROGRAM: Access Code: [email or web address] URL: [email or web address] Date: 05/12/2022 Prepared by: Ronny Flurry  Exercises - Supine Transversus Abdominis Bracing - Hands on Stomach  - 2 x daily - 7 x weekly - 2 sets - 10 reps - 5 seconds hold - Supine March  - 1 x daily - 7 x weekly - 2 sets - 10 reps - Hooklying Clamshell with Resistance  - 1 x daily - 4-5 x weekly - 2 sets - 10 reps - Supine Active Straight Leg Raise  - 1 x daily - 5-6 x weekly - 2 sets - 10 reps - Clamshell  - 1 x daily - 5-6 x weekly - 2 sets - 10 reps  ASSESSMENT:  CLINICAL IMPRESSION: Pt reports he is improving.  He is compliant with HEP.  Pt reports having some swelling in R knee and thinks it  may be from supine alt LE ext with TrA.  Pt was performing a combination of a heel slide and SLR.  Pt reports he can feel it in his R knee.  PT instructed pt in correct form with supine heel slide with TrA and alt LE ext HEP.  Pt states he could feel it mildly when performing supine heel slide with TrA correctly.  PT increased closed chain activities and pt tolerated exercises well.  He did require some UE support with standing on airex.  Pt responded well to Rx having no increased pain after Rx.  He should benefit from continued skilled PT services to address impairments and goals and improve overall function.      OBJECTIVE IMPAIRMENTS Abnormal gait, decreased activity tolerance, decreased endurance, decreased mobility, difficulty walking, decreased strength, impaired flexibility, and pain.   ACTIVITY LIMITATIONS standing, squatting, stairs, transfers, bed mobility, and locomotion  level  PARTICIPATION LIMITATIONS: meal prep, cleaning, and community activity  PERSONAL FACTORS 3+ comorbidities: multiple back surgeries, OA, Bilat knee pain R > L, Peripheral neuropathy, Pseudogout, DM type II, and L knee scope   are also affecting patient's functional outcome.   REHAB POTENTIAL: Good  CLINICAL DECISION MAKING: Evolving/moderate complexity  EVALUATION COMPLEXITY: Moderate   GOALS:   SHORT TERM GOALS: Target date: 06/02/2022  Pt will be independent with HEP for improved pain, strength, and function.  Baseline: Goal status: INITIAL  2.  Pt will tolerate aquatic therapy without adverse effects for improved strength and tolerance to activity.  Baseline:  Goal status: INITIAL Target date: 05/26/2022  3.  Pt will demo improved gait speed and increased step length.  Baseline:  Goal status: INITIAL  4.  Pt will report at least a 25% improvement in pain and tolerance to daily mobility.  Baseline:  Goal status: INITIAL    LONG TERM GOALS: Target date: 06/23/2022  Pt will be able to  perform his daily transfers without difficultly.  Baseline:  Goal status: INITIAL  2.  Pt will demo at least a 6-8 lb increase in bilat hip flexion and hip abd strength and L knee flexion to 5/5 MMT for improved performance of functional mobility skills including ambulation, stairs, and transfers.  Baseline:  Goal status: INITIAL  3.  Pt will report improved standing tolerance with ADLs/IADLs including preparing food and drying off after a shower.   Baseline:  Goal status: INITIAL  4.  Pt will report improved tolerance with community ambulation and the ability to ambulate his normal distance without significant difficulty and pain.   Baseline:  Goal status: INITIAL   PLAN: PT FREQUENCY: 2x/week  PT DURATION: 6 weeks  PLANNED INTERVENTIONS: Therapeutic exercises, Therapeutic activity, Neuromuscular re-education, Balance training, Gait training, Patient/Family education, Self Care, Joint mobilization, Stair training, Aquatic Therapy, Dry Needling, Electrical stimulation, Cryotherapy, Moist heat, Taping, Manual therapy, and Re-evaluation.  PLAN FOR NEXT SESSION:   Cont with land therapy.  Core and LE strengthening.    Selinda Michaels III PT, DPT 06/10/22 10:53 AM

## 2022-06-10 ENCOUNTER — Encounter (HOSPITAL_BASED_OUTPATIENT_CLINIC_OR_DEPARTMENT_OTHER): Payer: Self-pay | Admitting: Physical Therapy

## 2022-06-11 DIAGNOSIS — E113512 Type 2 diabetes mellitus with proliferative diabetic retinopathy with macular edema, left eye: Secondary | ICD-10-CM | POA: Diagnosis not present

## 2022-06-11 DIAGNOSIS — H348322 Tributary (branch) retinal vein occlusion, left eye, stable: Secondary | ICD-10-CM | POA: Diagnosis not present

## 2022-06-11 DIAGNOSIS — E113591 Type 2 diabetes mellitus with proliferative diabetic retinopathy without macular edema, right eye: Secondary | ICD-10-CM | POA: Diagnosis not present

## 2022-06-11 DIAGNOSIS — H43811 Vitreous degeneration, right eye: Secondary | ICD-10-CM | POA: Diagnosis not present

## 2022-06-16 ENCOUNTER — Encounter (HOSPITAL_BASED_OUTPATIENT_CLINIC_OR_DEPARTMENT_OTHER): Payer: Self-pay | Admitting: Physical Therapy

## 2022-06-16 ENCOUNTER — Ambulatory Visit (HOSPITAL_BASED_OUTPATIENT_CLINIC_OR_DEPARTMENT_OTHER): Payer: Medicare Other | Admitting: Physical Therapy

## 2022-06-16 DIAGNOSIS — M5459 Other low back pain: Secondary | ICD-10-CM | POA: Diagnosis not present

## 2022-06-16 DIAGNOSIS — R262 Difficulty in walking, not elsewhere classified: Secondary | ICD-10-CM

## 2022-06-16 DIAGNOSIS — M6281 Muscle weakness (generalized): Secondary | ICD-10-CM

## 2022-06-16 DIAGNOSIS — E1129 Type 2 diabetes mellitus with other diabetic kidney complication: Secondary | ICD-10-CM | POA: Diagnosis not present

## 2022-06-16 NOTE — Therapy (Signed)
OUTPATIENT PHYSICAL THERAPY  TREATMENT NOTE   Patient Name: Seth Young MRN: [id number] DOB:June 07, 1941, 81 y.o., male Today's Date: 06/17/2022    PT End of Session - 06/16/22        Visit Number 5    Number of Visits 12     Date for PT Re-Evaluation 06/23/22     Authorization Type [employer] MCR     PT Start Time 1305     PT Stop Time 1346     PT Time Calculation (min) 41 min     Activity Tolerance Patient tolerated treatment well     Behavior During Therapy WFL for tasks assessed/performed           Past Medical History:  Diagnosis Date   Arthritis    "knees, toes, hands, probably in my back" (03/09/2018)   Ataxia    Basal cell carcinoma    left temporal area-no residual   Colon polyps 10-12-11   past hx.   Coronary artery disease    Exertional angina 06/26/2014   Cath with DES to the OM, Bioflow protocol    GERD (gastroesophageal reflux disease)    H/O hiatal hernia    no problems   High cholesterol    History of echocardiogram    Echo 7/19:  Mild LVH, mild focal basal septal hypertrophy, EF 60-65, no RWMA, Gr 1 DD, trivial AI, MAC   Hypertension    Ischemic chest pain (HCC) 08/07/2014   Occasional tremors    Bilateral hands   Peripheral neuropathy    Pneumonia 1940's X 2; ~ 2017   "infant; walking pneumonia" (03/09/2018)   Pseudogout    "it moves around"   Sleep apnea    no cpap used   Type II diabetes mellitus (HCC) dx'd 1982   nsulin started ~ 2000   Vertigo    Past Surgical History:  Procedure Laterality Date   BACK SURGERY     BASAL CELL CARCINOMA EXCISION Left ~ 2012   face   CARDIAC CATHETERIZATION  02/2005   Dr. Patty Sermons; negative.   CARDIAC CATHETERIZATION N/A 05/13/2016   Procedure: Left Heart Cath and Coronary Angiography;  Surgeon: Jake Bathe, MD;  Location: [facility];  Service: Cardiovascular;  Laterality: N/A;   CARPAL TUNNEL RELEASE Left 10/2009   CARPAL TUNNEL RELEASE Right ~ 2013   CATARACT EXTRACTION W/ INTRAOCULAR LENS  IMPLANT Left 2000's   CATARACT EXTRACTION W/ INTRAOCULAR LENS IMPLANT Right 2015   COLONOSCOPY W/ POLYPECTOMY     CORONARY ANGIOPLASTY WITH STENT PLACEMENT  06/26/2014   "1"   CORONARY ANGIOPLASTY WITH STENT PLACEMENT  06/26/2014   pLAD 50%, d LAD 60%, oD1 80%,  mD1  70%, CFX 50%, OM 2 70%,  RCA 80%, PDA 95% (<83mm), OM1 99%-0% with Bio flow stent        CORONARY ANGIOPLASTY WITH STENT PLACEMENT  03/09/2018   CORONARY STENT INTERVENTION N/A 03/09/2018   Procedure: CORONARY STENT INTERVENTION;  Surgeon: Kathleene Hazel, MD;  Location: [facility];  Service: Cardiovascular;  Laterality: N/A;   ELBOW SURGERY Bilateral ~ 2014   "for blockages; Dr. Amanda Pea"   ENDARTERECTOMY Left 04/23/2015   Procedure: ENDARTERECTOMY CAROTID;  Surgeon: Fransisco Hertz, MD;  Location: [facility] OR;  Service: Vascular;  Laterality: Left;   EYE SURGERY Left    "related go diabetes; laser"   KNEE ARTHROSCOPY Left 05/2011   LEFT HEART CATH AND CORONARY ANGIOGRAPHY N/A 03/09/2018   Procedure: LEFT HEART CATH AND  CORONARY ANGIOGRAPHY;  Surgeon: Kathleene Hazel, MD;  Location: [facility];  Service: Cardiovascular;  Laterality: N/A;   LEFT HEART CATHETERIZATION WITH CORONARY ANGIOGRAM N/A 06/26/2014   Procedure: LEFT HEART CATHETERIZATION WITH CORONARY ANGIOGRAM;  Surgeon: Micheline Chapman, MD;  Location: [facility] C[facility];  Service: Cardiovascular;  Laterality: N/A;   LEFT HEART CATHETERIZATION WITH CORONARY ANGIOGRAM N/A 08/07/2014   Procedure: LEFT HEART CATHETERIZATION WITH CORONARY ANGIOGRAM;  Surgeon: Micheline Chapman, MD;  Location: [facility] CATH LAB;  Service: Cardiovascular;  Laterality: N/A;   LUMBAR LAMINECTOMY/DECOMPRESSION MICRODISCECTOMY Left 11/02/2017   Procedure: MICRODISCECTOMY LUMBAR THREE- LUMBAR FOUR, LEFT;  Surgeon: Coletta Memos, MD;  Location: [facility] OR;  Service: Neurosurgery;  Laterality: Left;   NASAL SEPTUM SURGERY  1979   PATCH ANGIOPLASTY Left 04/23/2015   Procedure: PATCH ANGIOPLASTY USING 1CM  X 6CM XENOSURE BIOLOGIC PATCH;  Surgeon: Fransisco Hertz, MD;  Location: [facility] OR;  Service: Vascular;  Laterality: Left;   POSTERIOR LUMBAR FUSION  1964   SHOULDER OPEN ROTATOR CUFF REPAIR  10/14/2011   Procedure: ROTATOR CUFF REPAIR SHOULDER OPEN;  Surgeon: Javier Docker, MD;  Location: [facility] ORS;  Service: Orthopedics;  Laterality: Right;   SUBACROMIAL DECOMPRESSION  10/14/2011   Procedure: SUBACROMIAL DECOMPRESSION;  Surgeon: Javier Docker, MD;  Location: [facility] OR[facility];  Service: Orthopedics;  Laterality: Right;   TRIGGER FINGER RELEASE Right ~ 2013-2014   "3rd & 4th digits"   Patient Active Problem List   Diagnosis Date Noted   Coronary artery disease involving native coronary artery of native heart with angina pectoris (HCC) 03/07/2018   HNP (herniated nucleus pulposus), lumbar 11/02/2017   Carotid artery disease without cerebral infarction (HCC) 04/09/2015   Chest pain of uncertain etiology 06/21/2014   Diabetic neuropathy (HCC) 06/21/2014   Essential hypertension 06/21/2014   Hypercholesterolemia 06/21/2014   Rotator cuff tear 10/15/2011   IDDM (insulin dependent diabetes mellitus) 10/16/2008    PCP: Geoffry Paradise, MD  REFERRING PROVIDER: Geoffry Paradise, MD   REFERRING DIAG: M54.40 (ICD-10-CM) - Lumbago with sciatica, unspecified side   Rationale for Evaluation and Treatment Rehabilitation  THERAPY DIAG:  Muscle weakness (generalized)  Other low back pain  Difficulty in walking, not elsewhere classified  ONSET DATE: PT order 04/21/2022 / Surgery March 2022  SUBJECTIVE:                                                                                                                                                                                           SUBJECTIVE STATEMENT: Pt has a hx of 3 lumbar surgeries.  His first surgery was in 1964 and his back did  well until 2019.  He began having pain in 2019 with no specific MOI.  His surgery in 2019 did not improve his sx's.  He then  had bulging discs and had another lumbar surgery in 2022.  Pt reports having improved pain though continued to have mobility deficits and weakness.   Pt states he can sometimes get up and walk without a cane.  Pt has difficulty with performing transfers and is limited with ambulation distance and standing duration.  Pt has weakness in legs and back.  Pt ambulates with cane and has difficulty with stairs.     Pt denies any adverse effects after prior Rx.  Pt reports compliance with HEP.  He states he is improving.  Pt denies any adverse effects after prior Rx.  Pt states he's not sure what exercise it is though he's having R knee pain.  He took a break from some of the exercises due to R knee pain.     PERTINENT HISTORY:  -3 back surgeries:  L3-5 laminotomy and pt thinks he has some screws in lumbar March 2022, Lumbar laminectomy/decompression/microdisctomy in 2019, Lumbar surgery in 1964  -OA, Bilat knee pain R > L, CAD, Peripheral neuropathy, Vertigo, Pseudogout, DM type II and diabetic retinopathy,   -Pt has Insulin monitor and tremors in bilat Ue's R > L  -PSHx:  R shoulder surgery in 2013 and was unable to repair rotator cuff, Coronary angioplasty with stent placement in 2015 and 2019, and L knee arthroscopy in 2012    PAIN:  NPRS:  0/10 current, 5/10 worst Location:  bilat lumbar flanks and R knee Type:  Pt states he has some lumbar stiffness. No pain   PRECAUTIONS: Other: multiple back surgeries, peripheral neuropathy, R shoulder RCR  WEIGHT BEARING RESTRICTIONS No  FALLS:  Has patient fallen in last 6 months? No  LIVING ENVIRONMENT: Lives with: lives with their spouse Lives in: 1 story home Stairs: 3-4 steps with rail Has following equipment at home: Single point cane, Environmental consultant - 2 wheeled, and Crutches  OCCUPATION: Pt works from home and sets his own hours.  PLOF: Independent; Pt began using SPC in early 2019.  Pt was able to perform daily mobility and daily activities  with greater ease and less difficulty  PATIENT GOALS improve mobility, get rid of cane, improve strength   OBJECTIVE:   DIAGNOSTIC FINDINGS:  FINDINGS: Segmentation:  Standard.   Alignment:  Trace anterolisthesis L3 on L4.   Vertebrae: No fracture, evidence of discitis, or bone lesion. There is some degenerative endplate signal change at L3-4.   Conus medullaris and cauda equina: Conus extends to the T12 level. Conus and cauda equina appear normal.   Paraspinal and other soft tissues: Negative.   Disc levels:   T11-12 and T12-L1 are imaged in the sagittal plane only and negative.   L1-2: Minimal disc bulge without stenosis.   L2-3: Shallow disc bulge and mild ligamentum flavum thickening. Mild central canal and left foraminal narrowing appear unchanged. The right foramen is open.   L3-4: Status post left laminotomy. There has been progression of disease at this level. A new and very large central left paracentral disc protrusion extends into the left foramen. There is severe compression of the thecal sac and narrowing in the lateral recesses, worse on the left. Moderate bilateral foraminal narrowing is worse on the left.   L4-5: Status post left laminotomy as seen on the prior exam. There is a shallow broad-based disc bulge. The narrowing in  the subarticular recesses is present which could impact either descending L5 root. Mild bilateral foraminal narrowing. No change.   L5-S1: A right subarticular recess protrusion impinging on the right S1 root appears unchanged. Foramina are open.   IMPRESSION: Since the prior MRI, the patient has developed a large central and left paracentral disc protrusion at L3-4 causing severe compression of the thecal sac and narrowing in the lateral recesses, worse on the left. Moderate bilateral foraminal narrowing at this level is worse on the left. Prior left laminotomy noted.   No change in a broad-based disc bulge at L4-5 causing  narrowing in the subarticular recesses which could impact either descending L5 root. Left laminotomy defect is seen at this level.   No change in a right subarticular recess protrusion at L5-S1. The disc appears to impinge on the right S1 root.     TODAY'S TREATMENT  Reviewed response to prior Rx, current function, HEP compliance, and pain level. Pt performed: Nustep just LE's L4 x 5 mins Supine heel slides with Tra 2 x 10 Supine SLR with TrA 2x10 Supine alt UE/LE 2x10 S/L clam with RTB 2x10 with TrA Standing hip abduction x10 with UE support on counter Standing heel raises with TrA 2x10 Sidestepping at rail with UE support x 2 laps Standing on airex without UE support with SBA and CGA 2 x 30 seconds each with FA and NBOS. Marching with TrA x10   PATIENT EDUCATION:  Education details:  HEP, POC, rationale of exercises, exercise form, relevant anatomy, and dx.  PT answered pt's questions. Person educated: Patient Education method: Explanation, Demonstration, Tactile cues, Verbal cues, and Handouts Education comprehension: verbalized understanding, returned demonstration, verbal cues required, tactile cues required, and needs further education   HOME EXERCISE PROGRAM: Access Code: [email or web address] URL: [email or web address] Date: 05/12/2022 Prepared by: Aaron Edelman  Exercises - Supine Transversus Abdominis Bracing - Hands on Stomach  - 2 x daily - 7 x weekly - 2 sets - 10 reps - 5 seconds hold - Supine March  - 1 x daily - 7 x weekly - 2 sets - 10 reps - Hooklying Clamshell with Resistance  - 1 x daily - 4-5 x weekly - 2 sets - 10 reps - Supine Active Straight Leg Raise  - 1 x daily - 5-6 x weekly - 2 sets - 10 reps - Clamshell  - 1 x daily - 5-6 x weekly - 2 sets - 10 reps  ASSESSMENT:  CLINICAL IMPRESSION: Pt states he is improving and reports he is able to walk some without cane.  Pt reports he has been having some R knee pain and swelling and not sure if the  exercises are causing it.  Pt required cuing to perform heel slides with TrA correctly.  Pt was performing a heel slide and then lifting foot up similar to small range leg raise at the end of the heel slide.  Pt reports R knee pain with standing hip abd on L.  PT instructed pt to not perform at home.  PT is increasing intensity of exercises and pt is tolerating progression well.  He responded well to Rx having no pain after Rx.     OBJECTIVE IMPAIRMENTS Abnormal gait, decreased activity tolerance, decreased endurance, decreased mobility, difficulty walking, decreased strength, impaired flexibility, and pain.   ACTIVITY LIMITATIONS standing, squatting, stairs, transfers, bed mobility, and locomotion level  PARTICIPATION LIMITATIONS: meal prep, cleaning, and community activity  PERSONAL FACTORS 3+ comorbidities: multiple back surgeries, OA, Bilat knee  pain R > L, Peripheral neuropathy, Pseudogout, DM type II, and L knee scope   are also affecting patient's functional outcome.   REHAB POTENTIAL: Good  CLINICAL DECISION MAKING: Evolving/moderate complexity  EVALUATION COMPLEXITY: Moderate   GOALS:   SHORT TERM GOALS: Target date: 06/02/2022  Pt will be independent with HEP for improved pain, strength, and function.  Baseline: Goal status: INITIAL  2.  Pt will tolerate aquatic therapy without adverse effects for improved strength and tolerance to activity.  Baseline:  Goal status: INITIAL Target date: 05/26/2022  3.  Pt will demo improved gait speed and increased step length.  Baseline:  Goal status: INITIAL  4.  Pt will report at least a 25% improvement in pain and tolerance to daily mobility.  Baseline:  Goal status: INITIAL    LONG TERM GOALS: Target date: 06/23/2022  Pt will be able to perform his daily transfers without difficultly.  Baseline:  Goal status: INITIAL  2.  Pt will demo at least a 6-8 lb increase in bilat hip flexion and hip abd strength and L knee flexion  to 5/5 MMT for improved performance of functional mobility skills including ambulation, stairs, and transfers.  Baseline:  Goal status: INITIAL  3.  Pt will report improved standing tolerance with ADLs/IADLs including preparing food and drying off after a shower.   Baseline:  Goal status: INITIAL  4.  Pt will report improved tolerance with community ambulation and the ability to ambulate his normal distance without significant difficulty and pain.   Baseline:  Goal status: INITIAL   PLAN: PT FREQUENCY: 2x/week  PT DURATION: 6 weeks  PLANNED INTERVENTIONS: Therapeutic exercises, Therapeutic activity, Neuromuscular re-education, Balance training, Gait training, Patient/Family education, Self Care, Joint mobilization, Stair training, Aquatic Therapy, Dry Needling, Electrical stimulation, Cryotherapy, Moist heat, Taping, Manual therapy, and Re-evaluation.  PLAN FOR NEXT SESSION:   Cont with land therapy.  Core and LE strengthening.  Reassess pt/PN next visit.    Audie Clear III PT, DPT 06/17/22 9:28 PM

## 2022-06-23 ENCOUNTER — Ambulatory Visit (HOSPITAL_BASED_OUTPATIENT_CLINIC_OR_DEPARTMENT_OTHER): Payer: Medicare Other | Admitting: Orthopaedic Surgery

## 2022-06-23 ENCOUNTER — Ambulatory Visit (HOSPITAL_BASED_OUTPATIENT_CLINIC_OR_DEPARTMENT_OTHER): Payer: Medicare Other | Admitting: Physical Therapy

## 2022-06-24 ENCOUNTER — Ambulatory Visit (INDEPENDENT_AMBULATORY_CARE_PROVIDER_SITE_OTHER): Payer: Medicare Other | Admitting: Orthopaedic Surgery

## 2022-06-24 ENCOUNTER — Ambulatory Visit (INDEPENDENT_AMBULATORY_CARE_PROVIDER_SITE_OTHER): Payer: Medicare Other

## 2022-06-24 DIAGNOSIS — M170 Bilateral primary osteoarthritis of knee: Secondary | ICD-10-CM | POA: Diagnosis not present

## 2022-06-24 DIAGNOSIS — G8929 Other chronic pain: Secondary | ICD-10-CM | POA: Diagnosis not present

## 2022-06-24 DIAGNOSIS — M25562 Pain in left knee: Secondary | ICD-10-CM | POA: Diagnosis not present

## 2022-06-24 DIAGNOSIS — M25462 Effusion, left knee: Secondary | ICD-10-CM | POA: Diagnosis not present

## 2022-06-24 DIAGNOSIS — M25461 Effusion, right knee: Secondary | ICD-10-CM | POA: Diagnosis not present

## 2022-06-24 MED ORDER — TRIAMCINOLONE ACETONIDE 40 MG/ML IJ SUSP
80.0000 mg | INTRAMUSCULAR | Status: AC | PRN
  Administered 2022-06-24: 80 mg via INTRA_ARTICULAR

## 2022-06-24 MED ORDER — LIDOCAINE HCL 1 % IJ SOLN
4.0000 mL | INTRAMUSCULAR | Status: AC | PRN
  Administered 2022-06-24: 4 mL

## 2022-06-24 NOTE — Progress Notes (Signed)
Chief Complaint: Right knee pain     History of Present Illness:    Seth Young is a 81 y.o. male presents today with right worse than left knee pain which has been going on for several years.  He states that he did have an injection into the knees several years prior which did give him relief.  He has not had anything since then.  He has recently had a series of several back surgeries and as result is hoping to avoid any type of surgical intervention for the knees.  He is complaining of crepitus as well as pain going up and down stairs or with most activities of daily living.  He does enjoy traveling in a spare time although this has been limited as result of his knee pain.    Surgical History:   None  PMH/PSH/Family History/Social History/Meds/Allergies:    Past Medical History:  Diagnosis Date  . Arthritis    "knees, toes, hands, probably in my back" (03/09/2018)  . Ataxia   . Basal cell carcinoma    left temporal area-no residual  . Colon polyps 10-12-11   past hx.  . Coronary artery disease   . Exertional angina 06/26/2014   Cath with DES to the OM, Bioflow protocol   . GERD (gastroesophageal reflux disease)   . H/O hiatal hernia    no problems  . High cholesterol   . History of echocardiogram    Echo 7/19:  Mild LVH, mild focal basal septal hypertrophy, EF 60-65, no RWMA, Gr 1 DD, trivial AI, MAC  . Hypertension   . Ischemic chest pain ([place]) 08/07/2014  . Occasional tremors    Bilateral hands  . Peripheral neuropathy   . Pneumonia 1940's X 2; ~ 2017   "infant; walking pneumonia" (03/09/2018)  . Pseudogout    "it moves around"  . Sleep apnea    no cpap used  . Type II diabetes mellitus ([place]) dx'd 1982   nsulin started ~ 2000  . Vertigo    Past Surgical History:  Procedure Laterality Date  . BACK SURGERY    . BASAL CELL CARCINOMA EXCISION Left ~ 2012   face  . CARDIAC CATHETERIZATION  02/2005   Dr. Mare Ferrari; negative.  Marland Kitchen  CARDIAC CATHETERIZATION N/A 05/13/2016   Procedure: Left Heart Cath and Coronary Angiography;  Surgeon: Jerline Pain, MD;  Location: [facility];  Service: Cardiovascular;  Laterality: N/A;  . CARPAL TUNNEL RELEASE Left 10/2009  . CARPAL TUNNEL RELEASE Right ~ 2013  . CATARACT EXTRACTION W/ INTRAOCULAR LENS IMPLANT Left 2000's  . CATARACT EXTRACTION W/ INTRAOCULAR LENS IMPLANT Right 2015  . COLONOSCOPY W/ POLYPECTOMY    . CORONARY ANGIOPLASTY WITH STENT PLACEMENT  06/26/2014   "1"  . CORONARY ANGIOPLASTY WITH STENT PLACEMENT  06/26/2014   pLAD 50%, d LAD 60%, oD1 80%,  mD1  70%, CFX 50%, OM 2 70%,  RCA 80%, PDA 95% (<63m), OM1 99%-0% with Bio flow stent       . CORONARY ANGIOPLASTY WITH STENT PLACEMENT  03/09/2018  . CORONARY STENT INTERVENTION N/A 03/09/2018   Procedure: CORONARY STENT INTERVENTION;  Surgeon: MBurnell Blanks MD;  [facility];  Service: Cardiovascular;  Laterality: N/A;  . ELBOW SURGERY Bilateral ~ 2014   "for blockages; Dr. GAmedeo Plenty  .  ENDARTERECTOMY Left 04/23/2015   Procedure: ENDARTERECTOMY CAROTID;  Surgeon: Conrad Palmdale, MD;  Location: [facility];  Service: Vascular;  Laterality: Left;  . EYE SURGERY Left    "related go diabetes; laser"  . KNEE ARTHROSCOPY Left 05/2011  . LEFT HEART CATH AND CORONARY ANGIOGRAPHY N/A 03/09/2018   Procedure: LEFT HEART CATH AND CORONARY ANGIOGRAPHY;  Surgeon: Burnell Blanks, MD;  Location: [facility];  Service: Cardiovascular;  Laterality: N/A;  . LEFT HEART CATHETERIZATION WITH CORONARY ANGIOGRAM N/A 06/26/2014   Procedure: LEFT HEART CATHETERIZATION WITH CORONARY ANGIOGRAM;  Surgeon: Blane Ohara, MD;  Location: [facility];  Service: Cardiovascular;  Laterality: N/A;  . LEFT HEART CATHETERIZATION WITH CORONARY ANGIOGRAM N/A 08/07/2014   Procedure: LEFT HEART CATHETERIZATION WITH CORONARY ANGIOGRAM;  Surgeon: Blane Ohara, MD;  Location: [facility];  Service: Cardiovascular;  Laterality:  N/A;  . LUMBAR LAMINECTOMY/DECOMPRESSION MICRODISCECTOMY Left 11/02/2017   Procedure: MICRODISCECTOMY LUMBAR THREE- LUMBAR FOUR, LEFT;  Surgeon: Ashok Pall, MD;  Location: [facility];  Service: Neurosurgery;  Laterality: Left;  . NASAL SEPTUM SURGERY  1979  . PATCH ANGIOPLASTY Left 04/23/2015   Procedure: PATCH ANGIOPLASTY USING 1CM X 6CM XENOSURE BIOLOGIC PATCH;  Surgeon: Conrad Lakeland, MD;  Location: [facility];  Service: Vascular;  Laterality: Left;  . [place]  . SHOULDER OPEN ROTATOR CUFF REPAIR  10/14/2011   Procedure: ROTATOR CUFF REPAIR SHOULDER OPEN;  Surgeon: Johnn Hai, MD;  Location: [facility];  Service: Orthopedics;  Laterality: Right;  . SUBACROMIAL DECOMPRESSION  10/14/2011   Procedure: SUBACROMIAL DECOMPRESSION;  Surgeon: Johnn Hai, MD;  Location: [facility];  Service: Orthopedics;  Laterality: Right;  . TRIGGER FINGER RELEASE Right ~ 2013-2014   "3rd & 4th digits"   Social History   Socioeconomic History  . Marital status: Married    Spouse name: Not on file  . Number of children: 5  . Years of education: College  . Highest education level: Not on file  Occupational History  . Not on file  Tobacco Use  . Smoking status: Former    Packs/day: 0.50    Years: 20.00    Total pack years: 10.00    Types: Cigarettes    Quit date: 10/11/1988    Years since quitting: 33.7  . Smokeless tobacco: Never  Vaping Use  . Vaping Use: Never used  Substance and Sexual Activity  . Alcohol use: No    Alcohol/week: 0.0 standard drinks of alcohol    Comment: Quit drinking in 1990  . Drug use: No  . Sexual activity: Not Currently  Other Topics Concern  . Not on file  Social History Narrative   Drinks about 2 cups of coffee a day, occasional diet pepsi.    Social Determinants of Health   Financial Resource Strain: Not on file  Food Insecurity: Not on file  Transportation Needs: Not on file  Physical Activity: Not on file  Stress: Not on file  Social Connections:  Not on file   Family History  Problem Relation Age of Onset  . Diabetes Mother   . Varicose Veins Mother   . Heart attack Father 20       "massive heart attack"  . Cancer Sister   . Diabetes Sister   . Hypertension Sister   . Varicose Veins Sister   . Diabetes Brother   . Hypertension Brother    Allergies  Allergen Reactions  . Dulaglutide  Other reaction(s): localized site reaction, dyspepsia  . Insulin Degludec     Other reaction(s): Itching and swelling in hands  . Gabapentin Other (See Comments)    Ataxia  . Morphine And Related Itching       . Naproxen Other (See Comments)    Neurological reaction. Tremors, hallucinates    Current Outpatient Medications  Medication Sig Dispense Refill  . acetaminophen (TYLENOL) 325 MG tablet Take 650 mg by mouth every 6 (six) hours as needed for moderate pain or headache.     Marland Kitchen aspirin 81 MG tablet Take 1 tablet (81 mg total) by mouth daily.    Marland Kitchen atorvastatin (LIPITOR) 20 MG tablet Take 20 mg by mouth daily.    Marland Kitchen augmented betamethasone dipropionate (DIPROLENE-AF) 0.05 % cream as needed for itching.    . chlorhexidine (PERIDEX) 0.12 % solution SWISH WITH 1-2 OUNCE FOR 30 SECONDS THEN SPIT TWICE DAILY as needed for flare up  0  . chlorpheniramine-HYDROcodone (TUSSIONEX) 10-8 MG/5ML SUER as needed for cough.    . clopidogrel (PLAVIX) 75 MG tablet Take 1 tablet by mouth once daily 90 tablet 1  . colchicine 0.6 MG tablet Take 0.6 mg by mouth daily as needed (gout).     . Cyanocobalamin (B-12) 5000 MCG CAPS Take 5,000 mcg by mouth daily.    . dorzolamide-timolol (COSOPT) 22.3-6.8 MG/ML ophthalmic solution Place 1 drop into both eyes 2 (two) times daily.    . empagliflozin (JARDIANCE) 25 MG TABS tablet Take 25 mg by mouth daily.    Marland Kitchen HUMALOG KWIKPEN 200 UNIT/ML SOPN Inject 10-22 Units as directed 3 (three) times daily. SLIDING SCALE  1  . HYDROcodone-acetaminophen (NORCO) 7.5-325 MG tablet as needed for pain.    Marland Kitchen insulin glargine  (LANTUS) 100 UNIT/ML injection Inject 40 Units into the skin 2 (two) times daily. Pt take 40 unit in the morning and 40 unit at bedtime.    . Insulin Pen Needle 31G X 8 MM MISC     . isosorbide mononitrate (IMDUR) 60 MG 24 hr tablet Take 1 tablet by mouth twice daily 180 tablet 1  . lisinopril (ZESTRIL) 5 MG tablet Take 1 tablet by mouth once daily 90 tablet 1  . LUMIGAN 0.01 % SOLN Place 1 drop into the left eye at bedtime.    Marland Kitchen LYRICA 100 MG capsule Take 100 mg by mouth 2 (two) times daily as needed. For neuropathic pain  2  . Melatonin 5 MG TABS Take 5 mg by mouth at bedtime.    . metFORMIN (GLUCOPHAGE) 1000 MG tablet Take 1,000 mg by mouth 2 (two) times daily with a meal.    . methocarbamol (ROBAXIN) 750 MG tablet as needed for pain.    . metoprolol tartrate (LOPRESSOR) 100 MG tablet Take 1 tablet (100 mg total) by mouth 2 (two) times daily. 180 tablet 2  . Multiple Vitamin (MULTI VITAMIN) TABS daily.    . nitroGLYCERIN (NITROSTAT) 0.4 MG SL tablet DISSOLVE ONE TABLET UNDER THE TONGUE EVERY 5 MINUTES AS NEEDED FOR CHEST PAIN. 25 tablet 0  . Omega-3 Fatty Acids (FISH OIL PO) Take 520 mg by mouth daily.    Marland Kitchen OVER THE COUNTER MEDICATION Apply 1 application topically daily as needed (nerve pain). Theraworx otc pain relief foam    . pantoprazole (PROTONIX) 40 MG tablet Take 1 tablet (40 mg total) by mouth 2 (two) times daily. 60 tablet 6  . Probiotic Product (PROBIOTIC PO) Take 1 tablet by mouth daily.    Marland Kitchen  pyridoxine (B-6) 100 MG tablet Take 100 mg by mouth daily.    Marland Kitchen triamcinolone cream (KENALOG) 0.1 % Apply 1 application topically daily as needed (lichen planus). With cetaphil     No current facility-administered medications for this visit.   No results found.  Review of Systems:   A ROS was performed including pertinent positives and negatives as documented in the HPI.  Physical Exam :   Constitutional: NAD and appears stated age Neurological: Alert and oriented Psych: Appropriate  affect and cooperative There were no vitals taken for this visit.   Comprehensive Musculoskeletal Exam:    Bilateral crepitus tricompartmental of both knees.  Range of motion is from 0 to 100 degrees with crepitus.  Tricompartmental pain.  Small effusion bilaterally  Imaging:   Xray (4 views right knee, 4 views left knee): There is evidence of chondrocalcinosis with moderate osteoarthritis of bilateral knees    I personally reviewed and interpreted the radiographs.   Assessment:   81 y.o. male with moderate osteoarthritis which I do believe is more significant for him today than his pseudogout.  To that effect I recommended ultrasound-guided injections of the knees.  He would like to proceed with this.  I did discuss alternatives could include gel injections or PRP.  At this time he is hoping to defer any type of additional surgery.  I will plan to see him back in the future to assess his knees as well as to assess his shoulder.  Plan :    -Bilateral ultrasound-guided knee injections performed today after verbal consent obtained    Procedure Note  Patient: Seth Young             Date of Birth: 07/25/1941           MRN: [id number]             Visit Date: 06/24/2022  Procedures: Visit Diagnoses:  1. Chronic pain of left knee     Large Joint Inj: R knee on 06/24/2022 11:27 AM Indications: pain Details: 22 G 1.5 in needle, ultrasound-guided anterior approach  Arthrogram: No  Medications: 4 mL lidocaine 1 %; 80 mg triamcinolone acetonide 40 MG/ML Outcome: tolerated well, no immediate complications Procedure, treatment alternatives, risks and benefits explained, specific risks discussed. Consent was given by the patient. Immediately prior to procedure a time out was called to verify the correct patient, procedure, equipment, support staff and site/side marked as required. Patient was prepped and draped in the usual sterile fashion.    Large Joint Inj: L knee on 06/24/2022  11:27 AM Indications: pain Details: 22 G 1.5 in needle, ultrasound-guided anterior approach  Arthrogram: No  Medications: 4 mL lidocaine 1 %; 80 mg triamcinolone acetonide 40 MG/ML Outcome: tolerated well, no immediate complications Procedure, treatment alternatives, risks and benefits explained, specific risks discussed. Consent was given by the patient. Immediately prior to procedure a time out was called to verify the correct patient, procedure, equipment, support staff and site/side marked as required. Patient was prepped and draped in the usual sterile fashion.          I personally saw and evaluated the patient, and participated in the management and treatment plan.  Vanetta Mulders, MD Attending Physician, Orthopedic Surgery  This document was dictated using Dragon voice recognition software. A reasonable attempt at proof reading has been made to minimize errors.

## 2022-06-25 ENCOUNTER — Ambulatory Visit (HOSPITAL_BASED_OUTPATIENT_CLINIC_OR_DEPARTMENT_OTHER): Payer: Medicare Other | Admitting: Physical Therapy

## 2022-06-29 ENCOUNTER — Ambulatory Visit (HOSPITAL_BASED_OUTPATIENT_CLINIC_OR_DEPARTMENT_OTHER): Payer: Medicare Other | Admitting: Physical Therapy

## 2022-06-29 DIAGNOSIS — M5459 Other low back pain: Secondary | ICD-10-CM | POA: Diagnosis not present

## 2022-06-29 DIAGNOSIS — M6281 Muscle weakness (generalized): Secondary | ICD-10-CM

## 2022-06-29 DIAGNOSIS — R262 Difficulty in walking, not elsewhere classified: Secondary | ICD-10-CM

## 2022-06-29 NOTE — Therapy (Signed)
OUTPATIENT PHYSICAL THERAPY  TREATMENT NOTE / PROGRESS NOTE Progress Note  Reporting Period 05/12/22 to 06/29/22  See note below for Objective Data and Assessment of Progress/Goals.       Patient Name: Seth Young MRN: [id number] DOB:08-Mar-1941, 81 y.o., male Today's Date: 06/30/2022   PT End of Session - 06/29/22 1359     Visit Number 6    Number of Visits 18    Date for PT Re-Evaluation 08/10/22    Authorization Type BC[employer] MCR    PT Start Time 1305    PT Stop Time 1478    PT Time Calculation (min) 42 min    Activity Tolerance Patient tolerated treatment well    Behavior During Therapy WFL for tasks assessed/performed               Past Medical History:  Diagnosis Date   Arthritis    "knees, toes, hands, probably in my back" (03/09/2018)   Ataxia    Basal cell carcinoma    left temporal area-no residual   Colon polyps 10-12-11   past hx.   Coronary artery disease    Exertional angina 06/26/2014   Cath with DES to the OM, Bioflow protocol    GERD (gastroesophageal reflux disease)    H/O hiatal hernia    no problems   High cholesterol    History of echocardiogram    Echo 7/19:  Mild LVH, mild focal basal septal hypertrophy, EF 60-65, no RWMA, Gr 1 DD, trivial AI, MAC   Hypertension    Ischemic chest pain (HCC) 08/07/2014   Occasional tremors    Bilateral hands   Peripheral neuropathy    Pneumonia 1940's X 2; ~ 2017   "infant; walking pneumonia" (03/09/2018)   Pseudogout    "it moves around"   Sleep apnea    no cpap used   Type II diabetes mellitus ([place]) dx'd 1982   nsulin started ~ 2000   Vertigo    Past Surgical History:  Procedure Laterality Date   BACK SURGERY     BASAL CELL CARCINOMA EXCISION Left ~ 2012   face   CARDIAC CATHETERIZATION  02/2005   Dr. Mare Ferrari; negative.   CARDIAC CATHETERIZATION N/A 05/13/2016   Procedure: Left Heart Cath and Coronary Angiography;  Surgeon: Jerline Pain, MD;  Location: [facility];  Service:  Cardiovascular;  Laterality: N/A;   CARPAL TUNNEL RELEASE Left 10/2009   CARPAL TUNNEL RELEASE Right ~ 2013   CATARACT EXTRACTION W/ INTRAOCULAR LENS IMPLANT Left 2000's   CATARACT EXTRACTION W/ INTRAOCULAR LENS IMPLANT Right 2015   COLONOSCOPY W/ POLYPECTOMY     CORONARY ANGIOPLASTY WITH STENT PLACEMENT  06/26/2014   "1"   CORONARY ANGIOPLASTY WITH STENT PLACEMENT  06/26/2014   pLAD 50%, d LAD 60%, oD1 80%,  mD1  70%, CFX 50%, OM 2 70%,  RCA 80%, PDA 95% (<57m), OM1 99%-0% with Bio flow stent        CORONARY ANGIOPLASTY WITH STENT PLACEMENT  03/09/2018   CORONARY STENT INTERVENTION N/A 03/09/2018   Procedure: CORONARY STENT INTERVENTION;  Surgeon: MBurnell Blanks MD;  [facility];  Service: Cardiovascular;  Laterality: N/A;   ELBOW SURGERY Bilateral ~ 2014   "for blockages; Dr. GAmedeo Plenty   ENDARTERECTOMY Left 04/23/2015   Procedure: ENDARTERECTOMY CAROTID;  Surgeon: BConrad  MD;  Location: [facility]  Service: Vascular;  Laterality: Left;   EYE SURGERY Left    "related go diabetes; laser"   KNEE  ARTHROSCOPY Left 05/2011   LEFT HEART CATH AND CORONARY ANGIOGRAPHY N/A 03/09/2018   Procedure: LEFT HEART CATH AND CORONARY ANGIOGRAPHY;  Surgeon: Burnell Blanks, MD;  Location: [facility];  Service: Cardiovascular;  Laterality: N/A;   LEFT HEART CATHETERIZATION WITH CORONARY ANGIOGRAM N/A 06/26/2014   Procedure: LEFT HEART CATHETERIZATION WITH CORONARY ANGIOGRAM;  Surgeon: Blane Ohara, MD;  Location[facility];  Service: Cardiovascular;  Laterality: N/A;   LEFT HEART CATHETERIZATION WITH CORONARY ANGIOGRAM N/A 08/07/2014   Procedure: LEFT HEART CATHETERIZATION WITH CORONARY ANGIOGRAM;  Surgeon: Blane Ohara, MD;  Location: [facility] C[facility] LAB;  Service: Cardiovascular;  Laterality: N/A;   LUMBAR LAMINECTOMY/DECOMPRESSION MICRODISCECTOMY Left 11/02/2017   Procedure: MICRODISCECTOMY LUMBAR THREE- LUMBAR FOUR, LEFT;  Surgeon: Ashok Pall, MD;  Location: [facility];  Service: Neurosurgery;  Laterality: Left;   NASAL SEPTUM SURGERY  1979   PATCH ANGIOPLASTY Left 04/23/2015   Procedure: PATCH ANGIOPLASTY USING 1CM X 6CM XENOSURE BIOLOGIC PATCH;  Surgeon: Conrad [place], MD;  Location: [facility];  Service: Vascular;  Laterality: Left;   [place]  10/14/2011   Procedure: ROTATOR CUFF REPAIR SHOULDER OPEN;  Surgeon: Johnn Hai, MD;  Location: [facility];  Service: Orthopedics;  Laterality: Right;   SUBACROMIAL DECOMPRESSION  10/14/2011   Procedure: SUBACROMIAL DECOMPRESSION;  Surgeon: Johnn Hai, MD;  Location: [facility];  Service: Orthopedics;  Laterality: Right;   TRIGGER FINGER RELEASE Right ~ 2013-2014   "3rd & 4th digits"   Patient Active Problem List   Diagnosis Date Noted   Coronary artery disease involving native coronary artery of native heart with angina pectoris ([place]) 03/07/2018   HNP (herniated nucleus pulposus), lumbar 11/02/2017   Carotid artery disease without cerebral infarction ([place]) 04/09/2015   Chest pain of uncertain etiology 83/15/1761   Diabetic neuropathy ([place]) 06/21/2014   Essential hypertension 06/21/2014   Hypercholesterolemia 06/21/2014   Rotator cuff tear 10/15/2011   IDDM (insulin dependent diabetes mellitus) 10/16/2008    PCP: Burnard Bunting, MD  REFERRING PROVIDER: Burnard Bunting, MD   REFERRING DIAG: M54.40 (ICD-10-CM) - Lumbago with sciatica, unspecified side   Rationale for Evaluation and Treatment Rehabilitation  THERAPY DIAG:  Muscle weakness (generalized)  Other low back pain  Difficulty in walking, not elsewhere classified  ONSET DATE: PT order 04/21/2022 / Surgery March 2022  SUBJECTIVE:                                                                                                                                                                                           SUBJECTIVE  STATEMENT: Pt reports he is making progress and we are going in  the right direction.  Pt saw Dr. Sammuel Hines last Thursday due to knee pain and received cortisone injections in bilat knees.  He states his knees feel 100% better.    RESPONSE TO PRIOR RX:  Pt denies any adverse effects after prior Rx.   HEP COMPLIANCE:  Pt reports compliance with HEP.  Pt states the exercises are hitting the right spots. FUNCTIONAL IMPROVEMENTS:  Pt states he can sometimes get up and walk without a cane.  Pt reports 30-40% improvement in pain and tolerance with daily mobility.   FUNCTIONAL LIMITATIONS:  weakness in back and legs.  Pt has difficulty with performing transfers and is limited with ambulation distance and standing duration. Stairs.  Standing to dry off after shower.  PERTINENT HISTORY:  -3 back surgeries:  L3-5 laminotomy and pt thinks he has some screws in lumbar March 2022, Lumbar laminectomy/decompression/microdisctomy in 2019, Lumbar surgery in 1964  -OA, Bilat knee pain R > L, CAD, Peripheral neuropathy, Vertigo, Pseudogout, DM type II and diabetic retinopathy,   -Pt has Insulin monitor and tremors in bilat Ue's R > L  -PSHx:  R shoulder surgery in 2013 and was unable to repair rotator cuff, Coronary angioplasty with stent placement in 2015 and 2019, and L knee arthroscopy in 2012    PAIN:  NPRS:  0/10 current, 5/10 worst Location:  bilat lumbar flanks and R knee Type:  Pt states he has some lumbar stiffness. No pain   PRECAUTIONS: Other: multiple back surgeries, peripheral neuropathy, R shoulder RCR  WEIGHT BEARING RESTRICTIONS No  FALLS:  Has patient fallen in last 6 months? No  LIVING ENVIRONMENT: Lives with: lives with their spouse Lives in: 1 story home Stairs: 3-4 steps with rail Has following equipment at home: Single point cane, Environmental consultant - 2 wheeled, and Crutches  OCCUPATION: Pt works from home and sets his own hours.  PLOF: Independent; Pt began using SPC in early 2019.  Pt was able to perform daily mobility and daily activities with  greater ease and less difficulty  PATIENT GOALS improve mobility, get rid of cane, improve strength legs and back.  Improve lumbar stiffness.  Improve performance of stairs   OBJECTIVE:   DIAGNOSTIC FINDINGS:  FINDINGS: Segmentation:  Standard.   Alignment:  Trace anterolisthesis L3 on L4.   Vertebrae: No fracture, evidence of discitis, or bone lesion. There is some degenerative endplate signal change at L3-4.   Conus medullaris and cauda equina: Conus extends to the T12 level. Conus and cauda equina appear normal.   Paraspinal and other soft tissues: Negative.   Disc levels:   T11-12 and T12-L1 are imaged in the sagittal plane only and negative.   L1-2: Minimal disc bulge without stenosis.   L2-3: Shallow disc bulge and mild ligamentum flavum thickening. Mild central canal and left foraminal narrowing appear unchanged. The right foramen is open.   L3-4: Status post left laminotomy. There has been progression of disease at this level. A new and very large central left paracentral disc protrusion extends into the left foramen. There is severe compression of the thecal sac and narrowing in the lateral recesses, worse on the left. Moderate bilateral foraminal narrowing is worse on the left.   L4-5: Status post left laminotomy as seen on the prior exam. There is a shallow broad-based disc bulge. The narrowing in the subarticular recesses is present which could impact either descending L5 root. Mild bilateral foraminal narrowing.  No change.   L5-S1: A right subarticular recess protrusion impinging on the right S1 root appears unchanged. Foramina are open.   IMPRESSION: Since the prior MRI, the patient has developed a large central and left paracentral disc protrusion at L3-4 causing severe compression of the thecal sac and narrowing in the lateral recesses, worse on the left. Moderate bilateral foraminal narrowing at this level is worse on the left. Prior left  laminotomy noted.   No change in a broad-based disc bulge at L4-5 causing narrowing in the subarticular recesses which could impact either descending L5 root. Left laminotomy defect is seen at this level.   No change in a right subarticular recess protrusion at L5-S1. The disc appears to impinge on the right S1 root.     TODAY'S TREATMENT  Reviewed response to prior Rx, current function, HEP compliance, and pain level. PT assessed goals and educated pt with progress.  PATIENT SURVEYS:  FOTO Initial/Current:  42/36.  Goal of 48 at visit #13  LOWER EXTREMITY MMT:     MMT and HHD Right eval Left eval Right 11/21 Left 11/21  Hip flexion 4+/5 ; 21.2 4/5 ; 19.8 4+/5 ; 26.2 4/5 ; 23.6  Hip extension        Hip abduction 22.0 21.8 21.9 23.8  Hip adduction        Hip internal rotation        Hip external rotation 4+/5 4+/5 4+/5 4+/5  Knee flexion 5/5 tested in sitting 4/5 tested in sitting  4/5 tested in sitting  Knee extension 5/5 5/5 5/5 5/5  Ankle dorsiflexion 5/5 5/5 5/5 5/5  Ankle plantarflexion Able to tolerate resistance though weak in sitting Able to tolerate resistance though weak in sitting Able to tolerate resistance though weak in sitting WFL  Ankle inversion        Ankle eversion         (Blank rows = not tested)        GAIT: Assistive device utilized: Single point cane on R  Level of assistance: Complete Independence Comments:  Improved gait speed and improved step length bilat.  bilat toe out R > L  Pt ambulated without SPC with increased favoring of R LE with further decreased gait speed.  Pt had 1 LOB with pt self correction.    Therapeutic Exercise: Pt performed: Supine alt UE/LE 2x10 S/L clam with RTB 2x10 with TrA Sidestepping at rail with UE support x 2 laps    PATIENT EDUCATION:  Education details:  HEP, POC, rationale of exercises, exercise form, relevant anatomy, and objective findings.  PT answered pt's questions. Person educated:  Patient Education method: Explanation, Demonstration, Tactile cues, Verbal cues, and Handouts Education comprehension: verbalized understanding, returned demonstration, verbal cues required, tactile cues required, and needs further education   HOME EXERCISE PROGRAM: Access Code: [email or web address] URL: [email or web address] Date: 05/12/2022 Prepared by: Ronny Flurry  Exercises - Supine Transversus Abdominis Bracing - Hands on Stomach  - 2 x daily - 7 x weekly - 2 sets - 10 reps - 5 seconds hold - Supine March  - 1 x daily - 7 x weekly - 2 sets - 10 reps - Hooklying Clamshell with Resistance  - 1 x daily - 4-5 x weekly - 2 sets - 10 reps - Supine Active Straight Leg Raise  - 1 x daily - 5-6 x weekly - 2 sets - 10 reps - Clamshell  - 1 x daily - 5-6 x weekly - 2 sets - 10  reps  ASSESSMENT:  CLINICAL IMPRESSION: Pt is progressing well with pain, strength, and function as evidenced by subjective reports and objective findings.  Pt reports 30-40% improvement in pain and tolerance with daily mobility.  He continues to have difficulty with performing transfers and stairs and is limited with ambulation distance and standing duration.  Pt has not performed aquatic therapy due to having difficulty with standing to dry off after the pool.  Pt states he wants to continue with land based therapy at this time.  Pt reports significant improvement in knee pain after receiving injections from Dr. Sammuel Hines last week.  Pt has made some improvements in strength in bilat LE's as evidenced by improved bilat hip flexion, L hip abd, and L ankle PF.  Pt has met STG's #1,3,4 and is progressing toward other goals.  Pt should benefit from cont skilled PT services to address ongoing goals, improve strength, and to improve overall function.     OBJECTIVE IMPAIRMENTS Abnormal gait, decreased activity tolerance, decreased endurance, decreased mobility, difficulty walking, decreased strength, impaired flexibility, and  pain.   ACTIVITY LIMITATIONS standing, squatting, stairs, transfers, bed mobility, and locomotion level  PARTICIPATION LIMITATIONS: meal prep, cleaning, and community activity  PERSONAL FACTORS 3+ comorbidities: multiple back surgeries, OA, Bilat knee pain R > L, Peripheral neuropathy, Pseudogout, DM type II, and L knee scope   are also affecting patient's functional outcome.   REHAB POTENTIAL: Good  CLINICAL DECISION MAKING: Evolving/moderate complexity  EVALUATION COMPLEXITY: Moderate   GOALS:   SHORT TERM GOALS: Target date: 06/02/2022  Pt will be independent with HEP for improved pain, strength, and function.  Baseline: Goal status: Goal met  2.  Pt will tolerate aquatic therapy without adverse effects for improved strength and tolerance to activity.  Baseline:  Goal status: not met Target date: 05/26/2022  3.  Pt will demo improved gait speed and increased step length.  Baseline:  Goal status: goal met  4.  Pt will report at least a 25% improvement in pain and tolerance to daily mobility.  Baseline:  Goal status: Goal met    LONG TERM GOALS: Target date: 08/10/2022  Pt will be able to perform his daily transfers without difficultly.  Baseline:  Goal status: ongoing  2.  Pt will demo at least a 6-8 lb increase in bilat hip flexion and hip abd strength and L knee flexion to 5/5 MMT for improved performance of functional mobility skills including ambulation, stairs, and transfers.  Baseline:  Goal status: progressing  3.  Pt will report improved standing tolerance with ADLs/IADLs including preparing food and drying off after a shower.   Baseline:  Goal status: Ongoing  4.  Pt will report improved tolerance with community ambulation and the ability to ambulate his normal distance without significant difficulty and pain.   Baseline:  Goal status: progressing   PLAN: PT FREQUENCY: 2x/week  PT DURATION: 6 weeks  PLANNED INTERVENTIONS: Therapeutic exercises,  Therapeutic activity, Neuromuscular re-education, Balance training, Gait training, Patient/Family education, Self Care, Joint mobilization, Stair training, Aquatic Therapy, Dry Needling, Electrical stimulation, Cryotherapy, Moist heat, Taping, Manual therapy, and Re-evaluation.  PLAN FOR NEXT SESSION:   Cont with land therapy.  Core and LE strengthening.  PN completed.  Will send cert to MD.   Selinda Michaels III PT, DPT 06/30/22 9:33 AM

## 2022-06-30 ENCOUNTER — Encounter (HOSPITAL_BASED_OUTPATIENT_CLINIC_OR_DEPARTMENT_OTHER): Payer: Self-pay | Admitting: Physical Therapy

## 2022-06-30 ENCOUNTER — Ambulatory Visit (HOSPITAL_BASED_OUTPATIENT_CLINIC_OR_DEPARTMENT_OTHER): Payer: Medicare Other | Admitting: Orthopaedic Surgery

## 2022-07-14 ENCOUNTER — Ambulatory Visit (HOSPITAL_BASED_OUTPATIENT_CLINIC_OR_DEPARTMENT_OTHER): Payer: Medicare Other | Attending: Internal Medicine | Admitting: Physical Therapy

## 2022-07-14 DIAGNOSIS — M6281 Muscle weakness (generalized): Secondary | ICD-10-CM | POA: Insufficient documentation

## 2022-07-14 DIAGNOSIS — M5459 Other low back pain: Secondary | ICD-10-CM | POA: Insufficient documentation

## 2022-07-14 DIAGNOSIS — R262 Difficulty in walking, not elsewhere classified: Secondary | ICD-10-CM | POA: Diagnosis not present

## 2022-07-14 NOTE — Therapy (Signed)
OUTPATIENT PHYSICAL THERAPY  TREATMENT NOTE        Patient Name: Seth Young MRN: [id number] DOB:11-28-1940, 81 y.o., male Today's Date: 07/15/2022   PT End of Session - 07/14/22 1350     Visit Number 7    Number of Visits 18    Date for PT Re-Evaluation 08/10/22    Authorization Type [employer] MCR    PT Start Time 1304    PT Stop Time 1345    PT Time Calculation (min) 41 min    Activity Tolerance Patient tolerated treatment well    Behavior During Therapy WFL for tasks assessed/performed               Past Medical History:  Diagnosis Date   Arthritis    "knees, toes, hands, probably in my back" (03/09/2018)   Ataxia    Basal cell carcinoma    left temporal area-no residual   Colon polyps 10-12-11   past hx.   Coronary artery disease    Exertional angina 06/26/2014   Cath with DES to the OM, Bioflow protocol    GERD (gastroesophageal reflux disease)    H/O hiatal hernia    no problems   High cholesterol    History of echocardiogram    Echo 7/19:  Mild LVH, mild focal basal septal hypertrophy, EF 60-65, no RWMA, Gr 1 DD, trivial AI, MAC   Hypertension    Ischemic chest pain (HCC) 08/07/2014   Occasional tremors    Bilateral hands   Peripheral neuropathy    Pneumonia 1940's X 2; ~ 2017   "infant; walking pneumonia" (03/09/2018)   Pseudogout    "it moves around"   Sleep apnea    no cpap used   Type II diabetes mellitus ([place]) dx'd 1982   nsulin started ~ 2000   Vertigo    Past Surgical History:  Procedure Laterality Date   BACK SURGERY     BASAL CELL CARCINOMA EXCISION Left ~ 2012   face   CARDIAC CATHETERIZATION  02/2005   Dr. Mare Ferrari; negative.   CARDIAC CATHETERIZATION N/A 05/13/2016   Procedure: Left Heart Cath and Coronary Angiography;  Surgeon: Jerline Pain, MD;  Location: [facility];  Service: Cardiovascular;  Laterality: N/A;   CARPAL TUNNEL RELEASE Left 10/2009   CARPAL TUNNEL RELEASE Right ~ 2013   CATARACT EXTRACTION W/ INTRAOCULAR LENS  IMPLANT Left 2000's   CATARACT EXTRACTION W/ INTRAOCULAR LENS IMPLANT Right 2015   COLONOSCOPY W/ POLYPECTOMY     CORONARY ANGIOPLASTY WITH STENT PLACEMENT  06/26/2014   "1"   CORONARY ANGIOPLASTY WITH STENT PLACEMENT  06/26/2014   pLAD 50%, d LAD 60%, oD1 80%,  mD1  70%, CFX 50%, OM 2 70%,  RCA 80%, PDA 95% (<57m), OM1 99%-0% with Bio flow stent        CORONARY ANGIOPLASTY WITH STENT PLACEMENT  03/09/2018   CORONARY STENT INTERVENTION N/A 03/09/2018   Procedure: CORONARY STENT INTERVENTION;  Surgeon: MBurnell Blanks MD;  [facility];  Service: Cardiovascular;  Laterality: N/A;   ELBOW SURGERY Bilateral ~ 2014   "for blockages; Dr. GAmedeo Plenty   ENDARTERECTOMY Left 04/23/2015   Procedure: ENDARTERECTOMY CAROTID;  Surgeon: BConrad Williamson MD;  Location[facility]  Service: Vascular;  Laterality: Left;   EYE SURGERY Left    "related go diabetes; laser"   KNEE ARTHROSCOPY Left 05/2011   LEFT HEART CATH AND CORONARY ANGIOGRAPHY N/A 03/09/2018   Procedure: LEFT HEART CATH AND CORONARY  ANGIOGRAPHY;  Surgeon: Burnell Blanks, MD;  Location: [facility];  Service: Cardiovascular;  Laterality: N/A;   LEFT HEART CATHETERIZATION WITH CORONARY ANGIOGRAM N/A 06/26/2014   Procedure: LEFT HEART CATHETERIZATION WITH CORONARY ANGIOGRAM;  Surgeon: Blane Ohara, MD;  Location: [facility];  Service: Cardiovascular;  Laterality: N/A;   LEFT HEART CATHETERIZATION WITH CORONARY ANGIOGRAM N/A 08/07/2014   Procedure: LEFT HEART CATHETERIZATION WITH CORONARY ANGIOGRAM;  Surgeon: Blane Ohara, MD;  Location: [facility] CATH LAB;  Service: Cardiovascular;  Laterality: N/A;   LUMBAR LAMINECTOMY/DECOMPRESSION MICRODISCECTOMY Left 11/02/2017   Procedure: MICRODISCECTOMY LUMBAR THREE- LUMBAR FOUR, LEFT;  Surgeon: Ashok Pall, MD;  Location: [facility];  Service: Neurosurgery;  Laterality: Left;   NASAL SEPTUM SURGERY  1979   PATCH ANGIOPLASTY Left 04/23/2015   Procedure: PATCH ANGIOPLASTY USING 1CM  X 6CM XENOSURE BIOLOGIC PATCH;  Surgeon: Conrad Tilghmanton, MD;  Location: [facility];  Service: Vascular;  Laterality: Left;   [place]  10/14/2011   Procedure: ROTATOR CUFF REPAIR SHOULDER OPEN;  Surgeon: Johnn Hai, MD;  Location: [facility];  Service: Orthopedics;  Laterality: Right;   SUBACROMIAL DECOMPRESSION  10/14/2011   Procedure: SUBACROMIAL DECOMPRESSION;  Surgeon: Johnn Hai, MD;  Location: [facility];  Service: Orthopedics;  Laterality: Right;   TRIGGER FINGER RELEASE Right ~ 2013-2014   "3rd & 4th digits"   Patient Active Problem List   Diagnosis Date Noted   Coronary artery disease involving native coronary artery of native heart with angina pectoris ([place]) 03/07/2018   HNP (herniated nucleus pulposus), lumbar 11/02/2017   Carotid artery disease without cerebral infarction ([place]) 04/09/2015   Chest pain of uncertain etiology 60/45/4098   Diabetic neuropathy ([place]) 06/21/2014   Essential hypertension 06/21/2014   Hypercholesterolemia 06/21/2014   Rotator cuff tear 10/15/2011   IDDM (insulin dependent diabetes mellitus) 10/16/2008    PCP: Burnard Bunting, MD  REFERRING PROVIDER: Burnard Bunting, MD   REFERRING DIAG: M54.40 (ICD-10-CM) - Lumbago with sciatica, unspecified side   Rationale for Evaluation and Treatment Rehabilitation  THERAPY DIAG:  Muscle weakness (generalized)  Other low back pain  Difficulty in walking, not elsewhere classified  ONSET DATE: PT order 04/21/2022 / Surgery March 2022  SUBJECTIVE:                                                                                                                                                                                           SUBJECTIVE STATEMENT: Pt states his R knee started bothering him again last week.  He does have pain in his R knee.  Pt reports he feels stronger in the back of his legs and he has improved posture.  "I feel like I am making  progress".  Pt reports compliance with HEP.  Pt states the S/L clams were bothering his leg and he stopped that exercise.  Pt denies any adverse effects after prior Rx.  Pt states he has not used his cane as much this week.   FUNCTIONAL IMPROVEMENTS:  Pt states he can sometimes get up and walk without a cane.  Pt reports 30-40% improvement in pain and tolerance with daily mobility.   FUNCTIONAL LIMITATIONS:  weakness in back and legs.  Pt has difficulty with performing transfers and is limited with ambulation distance and standing duration. Stairs.  Standing to dry off after shower.  PERTINENT HISTORY:  -3 back surgeries:  L3-5 laminotomy and pt thinks he has some screws in lumbar March 2022, Lumbar laminectomy/decompression/microdisctomy in 2019, Lumbar surgery in 1964  -OA, Bilat knee pain R > L, CAD, Peripheral neuropathy, Vertigo, Pseudogout, DM type II and diabetic retinopathy,   -Pt has Insulin monitor and tremors in bilat Ue's R > L  -PSHx:  R shoulder surgery in 2013 and was unable to repair rotator cuff, Coronary angioplasty with stent placement in 2015 and 2019, and L knee arthroscopy in 2012    PAIN:  NPRS:  0/10 current, 5/10 worst Location:  bilat lumbar flanks Type:  Pt states he has weakness, not pain   PRECAUTIONS: Other: multiple back surgeries, peripheral neuropathy, R shoulder RCR  WEIGHT BEARING RESTRICTIONS No  FALLS:  Has patient fallen in last 6 months? No  LIVING ENVIRONMENT: Lives with: lives with their spouse Lives in: 1 story home Stairs: 3-4 steps with rail Has following equipment at home: Single point cane, Environmental consultant - 2 wheeled, and Crutches  OCCUPATION: Pt works from home and sets his own hours.  PLOF: Independent; Pt began using SPC in early 2019.  Pt was able to perform daily mobility and daily activities with greater ease and less difficulty  PATIENT GOALS improve mobility, get rid of cane, improve strength legs and back.  Improve lumbar  stiffness.  Improve performance of stairs   OBJECTIVE:   DIAGNOSTIC FINDINGS:  FINDINGS: Segmentation:  Standard.   Alignment:  Trace anterolisthesis L3 on L4.   Vertebrae: No fracture, evidence of discitis, or bone lesion. There is some degenerative endplate signal change at L3-4.   Conus medullaris and cauda equina: Conus extends to the T12 level. Conus and cauda equina appear normal.   Paraspinal and other soft tissues: Negative.   Disc levels:   T11-12 and T12-L1 are imaged in the sagittal plane only and negative.   L1-2: Minimal disc bulge without stenosis.   L2-3: Shallow disc bulge and mild ligamentum flavum thickening. Mild central canal and left foraminal narrowing appear unchanged. The right foramen is open.   L3-4: Status post left laminotomy. There has been progression of disease at this level. A new and very large central left paracentral disc protrusion extends into the left foramen. There is severe compression of the thecal sac and narrowing in the lateral recesses, worse on the left. Moderate bilateral foraminal narrowing is worse on the left.   L4-5: Status post left laminotomy as seen on the prior exam. There is a shallow broad-based disc bulge. The narrowing in the subarticular recesses is present which could impact either descending L5 root. Mild bilateral foraminal narrowing. No change.   L5-S1: A right subarticular recess protrusion impinging on the  right S1 root appears unchanged. Foramina are open.   IMPRESSION: Since the prior MRI, the patient has developed a large central and left paracentral disc protrusion at L3-4 causing severe compression of the thecal sac and narrowing in the lateral recesses, worse on the left. Moderate bilateral foraminal narrowing at this level is worse on the left. Prior left laminotomy noted.   No change in a broad-based disc bulge at L4-5 causing narrowing in the subarticular recesses which could impact either  descending L5 root. Left laminotomy defect is seen at this level.   No change in a right subarticular recess protrusion at L5-S1. The disc appears to impinge on the right S1 root.     TODAY'S TREATMENT   Therapeutic Exercise: Reviewed response to prior Rx, current function, HEP compliance, and pain level.  Pt performed: Nustep just LE's L4 x 5 mins Supine SLR with TrA 2x10 Supine alt UE/LE 2x10 Supine clams with RTB 2x10 with TrA Standing hip abduction 2x5 with UE support on rail Standing heel raises with TrA 2x10 Sidestepping at rail with UE support x 3 laps Standing on airex without UE support with SBA and CGA 2 x 30 seconds each with FA and NBOS. Marching with TrA 2x10 Standing rows with YTB with TrA 2x10    PATIENT EDUCATION:  Education details:  HEP, POC, rationale of exercises, exercise form, relevant anatomy, and objective findings.  PT answered pt's questions. Person educated: Patient Education method: Explanation, Demonstration, Tactile cues, Verbal cues, and Handouts Education comprehension: verbalized understanding, returned demonstration, verbal cues required, tactile cues required, and needs further education   HOME EXERCISE PROGRAM: Access Code: [email or web address] URL: [email or web address] Date: 05/12/2022 Prepared by: Ronny Flurry  Exercises - Supine Transversus Abdominis Bracing - Hands on Stomach  - 2 x daily - 7 x weekly - 2 sets - 10 reps - 5 seconds hold - Supine March  - 1 x daily - 7 x weekly - 2 sets - 10 reps - Hooklying Clamshell with Resistance  - 1 x daily - 4-5 x weekly - 2 sets - 10 reps - Supine Active Straight Leg Raise  - 1 x daily - 5-6 x weekly - 2 sets - 10 reps - Clamshell  - 1 x daily - 5-6 x weekly - 2 sets - 10 reps  ASSESSMENT:  CLINICAL IMPRESSION: Pt is progressing well with pain, sx's, strength, and function as evidenced by subjective reports.  Patient's knees were feeling much better after injections from MD though  now reports the pain has returned.  Patient thinks he had a pain with side-lying ER at home.  PT had patient perform supine clams with band in the clinic and patient had no complaints.  PT educated pt concerning HEP.  PT is increasing closed chain exercises and pt is improving with tolerance for exercises.  He performed exercises well without c/o's.  Pt responded well to Rx having no c/o's after Rx.  He should benefit from cont skilled PT services to address ongoing goals, improve strength, and to improve overall function.     OBJECTIVE IMPAIRMENTS Abnormal gait, decreased activity tolerance, decreased endurance, decreased mobility, difficulty walking, decreased strength, impaired flexibility, and pain.   ACTIVITY LIMITATIONS standing, squatting, stairs, transfers, bed mobility, and locomotion level  PARTICIPATION LIMITATIONS: meal prep, cleaning, and community activity  PERSONAL FACTORS 3+ comorbidities: multiple back surgeries, OA, Bilat knee pain R > L, Peripheral neuropathy, Pseudogout, DM type II, and L knee scope   are also affecting patient's functional  outcome.   REHAB POTENTIAL: Good  CLINICAL DECISION MAKING: Evolving/moderate complexity  EVALUATION COMPLEXITY: Moderate   GOALS:   SHORT TERM GOALS: Target date: 06/02/2022  Pt will be independent with HEP for improved pain, strength, and function.  Baseline: Goal status: Goal met  2.  Pt will tolerate aquatic therapy without adverse effects for improved strength and tolerance to activity.  Baseline:  Goal status: not met Target date: 05/26/2022  3.  Pt will demo improved gait speed and increased step length.  Baseline:  Goal status: goal met  4.  Pt will report at least a 25% improvement in pain and tolerance to daily mobility.  Baseline:  Goal status: Goal met    LONG TERM GOALS: Target date: 08/10/2022  Pt will be able to perform his daily transfers without difficultly.  Baseline:  Goal status: ongoing  2.  Pt  will demo at least a 6-8 lb increase in bilat hip flexion and hip abd strength and L knee flexion to 5/5 MMT for improved performance of functional mobility skills including ambulation, stairs, and transfers.  Baseline:  Goal status: progressing  3.  Pt will report improved standing tolerance with ADLs/IADLs including preparing food and drying off after a shower.   Baseline:  Goal status: Ongoing  4.  Pt will report improved tolerance with community ambulation and the ability to ambulate his normal distance without significant difficulty and pain.   Baseline:  Goal status: progressing   PLAN: PT FREQUENCY: 2x/week  PT DURATION: 6 weeks  PLANNED INTERVENTIONS: Therapeutic exercises, Therapeutic activity, Neuromuscular re-education, Balance training, Gait training, Patient/Family education, Self Care, Joint mobilization, Stair training, Aquatic Therapy, Dry Needling, Electrical stimulation, Cryotherapy, Moist heat, Taping, Manual therapy, and Re-evaluation.  PLAN FOR NEXT SESSION:   Cont with land therapy.  Core and LE strengthening.     Selinda Michaels III PT, DPT 07/15/22 4:50 PM

## 2022-07-15 ENCOUNTER — Encounter (HOSPITAL_BASED_OUTPATIENT_CLINIC_OR_DEPARTMENT_OTHER): Payer: Self-pay | Admitting: Physical Therapy

## 2022-07-16 ENCOUNTER — Other Ambulatory Visit: Payer: Self-pay | Admitting: Cardiology

## 2022-07-16 DIAGNOSIS — E1129 Type 2 diabetes mellitus with other diabetic kidney complication: Secondary | ICD-10-CM | POA: Diagnosis not present

## 2022-07-19 DIAGNOSIS — E113512 Type 2 diabetes mellitus with proliferative diabetic retinopathy with macular edema, left eye: Secondary | ICD-10-CM | POA: Diagnosis not present

## 2022-07-19 DIAGNOSIS — H02403 Unspecified ptosis of bilateral eyelids: Secondary | ICD-10-CM | POA: Diagnosis not present

## 2022-07-19 DIAGNOSIS — H26491 Other secondary cataract, right eye: Secondary | ICD-10-CM | POA: Diagnosis not present

## 2022-07-19 DIAGNOSIS — H401134 Primary open-angle glaucoma, bilateral, indeterminate stage: Secondary | ICD-10-CM | POA: Diagnosis not present

## 2022-07-21 ENCOUNTER — Ambulatory Visit (HOSPITAL_BASED_OUTPATIENT_CLINIC_OR_DEPARTMENT_OTHER): Payer: Medicare Other | Admitting: Physical Therapy

## 2022-07-21 ENCOUNTER — Encounter (HOSPITAL_BASED_OUTPATIENT_CLINIC_OR_DEPARTMENT_OTHER): Payer: Self-pay | Admitting: Physical Therapy

## 2022-07-21 DIAGNOSIS — M6281 Muscle weakness (generalized): Secondary | ICD-10-CM

## 2022-07-21 DIAGNOSIS — M5459 Other low back pain: Secondary | ICD-10-CM | POA: Diagnosis not present

## 2022-07-21 DIAGNOSIS — R262 Difficulty in walking, not elsewhere classified: Secondary | ICD-10-CM

## 2022-07-21 NOTE — Therapy (Signed)
OUTPATIENT PHYSICAL THERAPY  TREATMENT NOTE        Patient Name: Seth Young MRN: [id number] DOB:11-28-1940, 81 y.o., male Today's Date: 07/15/2022   PT End of Session - 07/14/22 1350     Visit Number 7    Number of Visits 18    Date for PT Re-Evaluation 08/10/22    Authorization Type [employer] MCR    PT Start Time 1304    PT Stop Time 1345    PT Time Calculation (min) 41 min    Activity Tolerance Patient tolerated treatment well    Behavior During Therapy WFL for tasks assessed/performed               Past Medical History:  Diagnosis Date   Arthritis    "knees, toes, hands, probably in my back" (03/09/2018)   Ataxia    Basal cell carcinoma    left temporal area-no residual   Colon polyps 10-12-11   past hx.   Coronary artery disease    Exertional angina 06/26/2014   Cath with DES to the OM, Bioflow protocol    GERD (gastroesophageal reflux disease)    H/O hiatal hernia    no problems   High cholesterol    History of echocardiogram    Echo 7/19:  Mild LVH, mild focal basal septal hypertrophy, EF 60-65, no RWMA, Gr 1 DD, trivial AI, MAC   Hypertension    Ischemic chest pain (HCC) 08/07/2014   Occasional tremors    Bilateral hands   Peripheral neuropathy    Pneumonia 1940's X 2; ~ 2017   "infant; walking pneumonia" (03/09/2018)   Pseudogout    "it moves around"   Sleep apnea    no cpap used   Type II diabetes mellitus ([place]) dx'd 1982   nsulin started ~ 2000   Vertigo    Past Surgical History:  Procedure Laterality Date   BACK SURGERY     BASAL CELL CARCINOMA EXCISION Left ~ 2012   face   CARDIAC CATHETERIZATION  02/2005   Dr. Mare Ferrari; negative.   CARDIAC CATHETERIZATION N/A 05/13/2016   Procedure: Left Heart Cath and Coronary Angiography;  Surgeon: Jerline Pain, MD;  Location: [facility];  Service: Cardiovascular;  Laterality: N/A;   CARPAL TUNNEL RELEASE Left 10/2009   CARPAL TUNNEL RELEASE Right ~ 2013   CATARACT EXTRACTION W/ INTRAOCULAR LENS  IMPLANT Left 2000's   CATARACT EXTRACTION W/ INTRAOCULAR LENS IMPLANT Right 2015   COLONOSCOPY W/ POLYPECTOMY     CORONARY ANGIOPLASTY WITH STENT PLACEMENT  06/26/2014   "1"   CORONARY ANGIOPLASTY WITH STENT PLACEMENT  06/26/2014   pLAD 50%, d LAD 60%, oD1 80%,  mD1  70%, CFX 50%, OM 2 70%,  RCA 80%, PDA 95% (<57m), OM1 99%-0% with Bio flow stent        CORONARY ANGIOPLASTY WITH STENT PLACEMENT  03/09/2018   CORONARY STENT INTERVENTION N/A 03/09/2018   Procedure: CORONARY STENT INTERVENTION;  Surgeon: MBurnell Blanks MD;  [facility];  Service: Cardiovascular;  Laterality: N/A;   ELBOW SURGERY Bilateral ~ 2014   "for blockages; Dr. GAmedeo Plenty   ENDARTERECTOMY Left 04/23/2015   Procedure: ENDARTERECTOMY CAROTID;  Surgeon: BConrad Williamson MD;  Location[facility]  Service: Vascular;  Laterality: Left;   EYE SURGERY Left    "related go diabetes; laser"   KNEE ARTHROSCOPY Left 05/2011   LEFT HEART CATH AND CORONARY ANGIOGRAPHY N/A 03/09/2018   Procedure: LEFT HEART CATH AND CORONARY  ANGIOGRAPHY;  Surgeon: Burnell Blanks, MD;  Location: [facility];  Service: Cardiovascular;  Laterality: N/A;   LEFT HEART CATHETERIZATION WITH CORONARY ANGIOGRAM N/A 06/26/2014   Procedure: LEFT HEART CATHETERIZATION WITH CORONARY ANGIOGRAM;  Surgeon: Blane Ohara, MD;  Location: [facility];  Service: Cardiovascular;  Laterality: N/A;   LEFT HEART CATHETERIZATION WITH CORONARY ANGIOGRAM N/A 08/07/2014   Procedure: LEFT HEART CATHETERIZATION WITH CORONARY ANGIOGRAM;  Surgeon: Blane Ohara, MD;  Location: [facility] LAB;  Service: Cardiovascular;  Laterality: N/A;   LUMBAR LAMINECTOMY/DECOMPRESSION MICRODISCECTOMY Left 11/02/2017   Procedure: MICRODISCECTOMY LUMBAR THREE- LUMBAR FOUR, LEFT;  Surgeon: Ashok Pall, MD;  Location: [place];  Service: Neurosurgery;  Laterality: Left;   NASAL SEPTUM SURGERY  1979   PATCH ANGIOPLASTY Left 04/23/2015   Procedure: PATCH ANGIOPLASTY USING 1CM  X 6CM XENOSURE BIOLOGIC PATCH;  Surgeon: Conrad Roper, MD;  Location: [place];  Service: Vascular;  Laterality: Left;   [place]  10/14/2011   Procedure: ROTATOR CUFF REPAIR SHOULDER OPEN;  Surgeon: Johnn Hai, MD;  Location: [facility];  Service: Orthopedics;  Laterality: Right;   SUBACROMIAL DECOMPRESSION  10/14/2011   Procedure: SUBACROMIAL DECOMPRESSION;  Surgeon: Johnn Hai, MD;  Location: [facility];  Service: Orthopedics;  Laterality: Right;   TRIGGER FINGER RELEASE Right ~ 2013-2014   "3rd & 4th digits"   Patient Active Problem List   Diagnosis Date Noted   Coronary artery disease involving native coronary artery of native heart with angina pectoris ([place]) 03/07/2018   HNP (herniated nucleus pulposus), lumbar 11/02/2017   Carotid artery disease without cerebral infarction ([place]) 04/09/2015   Chest pain of uncertain etiology 81/08/7508   Diabetic neuropathy ([place]) 06/21/2014   Essential hypertension 06/21/2014   Hypercholesterolemia 06/21/2014   Rotator cuff tear 10/15/2011   IDDM (insulin dependent diabetes mellitus) 10/16/2008    PCP: Burnard Bunting, MD  REFERRING PROVIDER: Burnard Bunting, MD   REFERRING DIAG: M54.40 (ICD-10-CM) - Lumbago with sciatica, unspecified side   Rationale for Evaluation and Treatment Rehabilitation  THERAPY DIAG:  Muscle weakness (generalized)  Other low back pain  Difficulty in walking, not elsewhere classified  ONSET DATE: PT order 04/21/2022 / Surgery March 2022  SUBJECTIVE:                                                                                                                                                                                           SUBJECTIVE STATEMENT: Pt reports having increased knee pain and back pain and fatigue the following day after prior Rx.  He states  he had difficulty with walking.  Pt reports he felt fine 2 days after prior Rx.  Pt states "I'm  having a good week".  Pt reports he's walking more without his cane.  Pt reports improvement with what PT is doing.   Pt states his R knee started bothering him again last week.  He does have pain in his R knee.  Pt reports he feels stronger in the back of his legs and he has improved posture.  "I feel like I am making progress".  Pt reports compliance with HEP.  Pt states the S/L clams were bothering his leg and he stopped that exercise.  Pt denies any adverse effects after prior Rx.  Pt states he has not used his cane as much this week.   FUNCTIONAL IMPROVEMENTS:  Pt states he can sometimes get up and walk without a cane.  Pt reports 30-40% improvement in pain and tolerance with daily mobility.   FUNCTIONAL LIMITATIONS:  weakness in back and legs.  Pt has difficulty with performing transfers and is limited with ambulation distance and standing duration. Stairs.  Standing to dry off after shower.  PERTINENT HISTORY:  -3 back surgeries:  L3-5 laminotomy and pt thinks he has some screws in lumbar March 2022, Lumbar laminectomy/decompression/microdisctomy in 2019, Lumbar surgery in 1964  -OA, Bilat knee pain R > L, CAD, Peripheral neuropathy, Vertigo, Pseudogout, DM type II and diabetic retinopathy,   -Pt has Insulin monitor and tremors in bilat Ue's R > L  -PSHx:  R shoulder surgery in 2013 and was unable to repair rotator cuff, Coronary angioplasty with stent placement in 2015 and 2019, and L knee arthroscopy in 2012    PAIN:  NPRS:  0/10 current, 5/10 worst Location:  bilat lumbar flanks Type:  Pt states he has weakness, not pain   PRECAUTIONS: Other: multiple back surgeries, peripheral neuropathy, R shoulder RCR  WEIGHT BEARING RESTRICTIONS No  FALLS:  Has patient fallen in last 6 months? No  LIVING ENVIRONMENT: Lives with: lives with their spouse Lives in: 1 story home Stairs: 3-4 steps with rail Has following equipment at home: Single point cane, Environmental consultant - 2 wheeled, and  Crutches  OCCUPATION: Pt works from home and sets his own hours.  PLOF: Independent; Pt began using SPC in early 2019.  Pt was able to perform daily mobility and daily activities with greater ease and less difficulty  PATIENT GOALS improve mobility, get rid of cane, improve strength legs and back.  Improve lumbar stiffness.  Improve performance of stairs   OBJECTIVE:   DIAGNOSTIC FINDINGS:  FINDINGS: Segmentation:  Standard.   Alignment:  Trace anterolisthesis L3 on L4.   Vertebrae: No fracture, evidence of discitis, or bone lesion. There is some degenerative endplate signal change at L3-4.   Conus medullaris and cauda equina: Conus extends to the T12 level. Conus and cauda equina appear normal.   Paraspinal and other soft tissues: Negative.   Disc levels:   T11-12 and T12-L1 are imaged in the sagittal plane only and negative.   L1-2: Minimal disc bulge without stenosis.   L2-3: Shallow disc bulge and mild ligamentum flavum thickening. Mild central canal and left foraminal narrowing appear unchanged. The right foramen is open.   L3-4: Status post left laminotomy. There has been progression of disease at this level. A new and very large central left paracentral disc protrusion extends into the left foramen. There is severe compression of the thecal sac and narrowing in the lateral recesses,  worse on the left. Moderate bilateral foraminal narrowing is worse on the left.   L4-5: Status post left laminotomy as seen on the prior exam. There is a shallow broad-based disc bulge. The narrowing in the subarticular recesses is present which could impact either descending L5 root. Mild bilateral foraminal narrowing. No change.   L5-S1: A right subarticular recess protrusion impinging on the right S1 root appears unchanged. Foramina are open.   IMPRESSION: Since the prior MRI, the patient has developed a large central and left paracentral disc protrusion at L3-4 causing  severe compression of the thecal sac and narrowing in the lateral recesses, worse on the left. Moderate bilateral foraminal narrowing at this level is worse on the left. Prior left laminotomy noted.   No change in a broad-based disc bulge at L4-5 causing narrowing in the subarticular recesses which could impact either descending L5 root. Left laminotomy defect is seen at this level.   No change in a right subarticular recess protrusion at L5-S1. The disc appears to impinge on the right S1 root.     TODAY'S TREATMENT   Therapeutic Exercise: Reviewed response to prior Rx, current function, HEP compliance, and pain level.  Pt performed: Nustep just LE's L4 x 5 mins Supine SLR with TrA x10 and with contralateral UE ext x 10 each Supine clams with RTB 2x10 with TrA Standing hip abduction 2x5 with UE support on rail Standing heel raises with TrA 2x10 Sidestepping at rail with UE support x 2 laps Marching with TrA 2x10 Standing rows with RTB with TrA 2x10   Neuro Re-ed activities: Marching on airex 2x10 with UE support on rail Standing on airex without UE support with SBA and CGA 2 x 30 seconds each with FA and NBOS.  Pt had 1-2 LOB's using UE support. Step ups on airex x 10 each leg with UE support    PATIENT EDUCATION:  Education details:  HEP, POC, rationale of exercises, exercise form, relevant anatomy, and objective findings.  PT answered pt's questions. Person educated: Patient Education method: Explanation, Demonstration, Tactile cues, Verbal cues, and Handouts Education comprehension: verbalized understanding, returned demonstration, verbal cues required, tactile cues required, and needs further education   HOME EXERCISE PROGRAM: Access Code: [email or web address] URL: [email or web address] Date: 05/12/2022 Prepared by: Ronny Flurry  Exercises - Supine Transversus Abdominis Bracing - Hands on Stomach  - 2 x daily - 7 x weekly - 2 sets - 10 reps - 5 seconds  hold - Supine March  - 1 x daily - 7 x weekly - 2 sets - 10 reps - Hooklying Clamshell with Resistance  - 1 x daily - 4-5 x weekly - 2 sets - 10 reps - Supine Active Straight Leg Raise  - 1 x daily - 5-6 x weekly - 2 sets - 10 reps - Clamshell  - 1 x daily - 5-6 x weekly - 2 sets - 10 reps  ASSESSMENT:  CLINICAL IMPRESSION: Pt is progressing well with pain, sx's, strength, and function as evidenced by subjective reports.  Patient's knees were feeling much better after injections from MD though now reports the pain has returned.  Patient thinks he had a pain with side-lying ER at home.  PT had patient perform supine clams with band in the clinic and patient had no complaints.  PT educated pt concerning HEP.  PT is increasing closed chain exercises and pt is improving with tolerance for exercises.  He performed exercises well without c/o's.  Pt responded well to Rx  having no c/o's after Rx.  He should benefit from cont skilled PT services to address ongoing goals, improve strength, and to improve overall function.     OBJECTIVE IMPAIRMENTS Abnormal gait, decreased activity tolerance, decreased endurance, decreased mobility, difficulty walking, decreased strength, impaired flexibility, and pain.   ACTIVITY LIMITATIONS standing, squatting, stairs, transfers, bed mobility, and locomotion level  PARTICIPATION LIMITATIONS: meal prep, cleaning, and community activity  PERSONAL FACTORS 3+ comorbidities: multiple back surgeries, OA, Bilat knee pain R > L, Peripheral neuropathy, Pseudogout, DM type II, and L knee scope   are also affecting patient's functional outcome.   REHAB POTENTIAL: Good  CLINICAL DECISION MAKING: Evolving/moderate complexity  EVALUATION COMPLEXITY: Moderate   GOALS:   SHORT TERM GOALS: Target date: 06/02/2022  Pt will be independent with HEP for improved pain, strength, and function.  Baseline: Goal status: Goal met  2.  Pt will tolerate aquatic therapy without adverse  effects for improved strength and tolerance to activity.  Baseline:  Goal status: not met Target date: 05/26/2022  3.  Pt will demo improved gait speed and increased step length.  Baseline:  Goal status: goal met  4.  Pt will report at least a 25% improvement in pain and tolerance to daily mobility.  Baseline:  Goal status: Goal met    LONG TERM GOALS: Target date: 08/10/2022  Pt will be able to perform his daily transfers without difficultly.  Baseline:  Goal status: ongoing  2.  Pt will demo at least a 6-8 lb increase in bilat hip flexion and hip abd strength and L knee flexion to 5/5 MMT for improved performance of functional mobility skills including ambulation, stairs, and transfers.  Baseline:  Goal status: progressing  3.  Pt will report improved standing tolerance with ADLs/IADLs including preparing food and drying off after a shower.   Baseline:  Goal status: Ongoing  4.  Pt will report improved tolerance with community ambulation and the ability to ambulate his normal distance without significant difficulty and pain.   Baseline:  Goal status: progressing   PLAN: PT FREQUENCY: 2x/week  PT DURATION: 6 weeks  PLANNED INTERVENTIONS: Therapeutic exercises, Therapeutic activity, Neuromuscular re-education, Balance training, Gait training, Patient/Family education, Self Care, Joint mobilization, Stair training, Aquatic Therapy, Dry Needling, Electrical stimulation, Cryotherapy, Moist heat, Taping, Manual therapy, and Re-evaluation.  PLAN FOR NEXT SESSION:   Cont with land therapy.  Core and LE strengthening.     Selinda Michaels III PT, DPT 07/15/22 4:50 PM

## 2022-07-28 ENCOUNTER — Encounter (HOSPITAL_BASED_OUTPATIENT_CLINIC_OR_DEPARTMENT_OTHER): Payer: Medicare Other | Admitting: Physical Therapy

## 2022-07-28 DIAGNOSIS — H34232 Retinal artery branch occlusion, left eye: Secondary | ICD-10-CM | POA: Diagnosis not present

## 2022-07-28 DIAGNOSIS — E1129 Type 2 diabetes mellitus with other diabetic kidney complication: Secondary | ICD-10-CM | POA: Diagnosis not present

## 2022-07-28 DIAGNOSIS — M1990 Unspecified osteoarthritis, unspecified site: Secondary | ICD-10-CM | POA: Diagnosis not present

## 2022-07-28 DIAGNOSIS — E669 Obesity, unspecified: Secondary | ICD-10-CM | POA: Diagnosis not present

## 2022-08-04 ENCOUNTER — Encounter (HOSPITAL_BASED_OUTPATIENT_CLINIC_OR_DEPARTMENT_OTHER): Payer: Self-pay | Admitting: Physical Therapy

## 2022-08-04 ENCOUNTER — Ambulatory Visit (HOSPITAL_BASED_OUTPATIENT_CLINIC_OR_DEPARTMENT_OTHER): Payer: Medicare Other | Admitting: Physical Therapy

## 2022-08-04 DIAGNOSIS — M5459 Other low back pain: Secondary | ICD-10-CM

## 2022-08-04 DIAGNOSIS — M6281 Muscle weakness (generalized): Secondary | ICD-10-CM

## 2022-08-04 DIAGNOSIS — R262 Difficulty in walking, not elsewhere classified: Secondary | ICD-10-CM | POA: Diagnosis not present

## 2022-08-04 NOTE — Therapy (Signed)
OUTPATIENT PHYSICAL THERAPY  TREATMENT NOTE        Patient Name: Seth Young MRN: [id number] DOB:Nov 05, 1940, 81 y.o., male Today's Date: 08/05/2022   PT End of Session - 08/04/22 1340     Visit Number 9    Number of Visits 18    Date for PT Re-Evaluation 08/10/22    Authorization Type [employer] MCR    PT Start Time 3846    PT Stop Time 1345    PT Time Calculation (min) 40 min    Activity Tolerance Patient tolerated treatment well    Behavior During Therapy WFL for tasks assessed/performed                Past Medical History:  Diagnosis Date   Arthritis    "knees, toes, hands, probably in my back" (03/09/2018)   Ataxia    Basal cell carcinoma    left temporal area-no residual   Colon polyps 10-12-11   past hx.   Coronary artery disease    Exertional angina 06/26/2014   Cath with DES to the OM, Bioflow protocol    GERD (gastroesophageal reflux disease)    H/O hiatal hernia    no problems   High cholesterol    History of echocardiogram    Echo 7/19:  Mild LVH, mild focal basal septal hypertrophy, EF 60-65, no RWMA, Gr 1 DD, trivial AI, MAC   Hypertension    Ischemic chest pain (HCC) 08/07/2014   Occasional tremors    Bilateral hands   Peripheral neuropathy    Pneumonia 1940's X 2; ~ 2017   "infant; walking pneumonia" (03/09/2018)   Pseudogout    "it moves around"   Sleep apnea    no cpap used   Type II diabetes mellitus ([place]) dx'd 1982   nsulin started ~ 2000   Vertigo    Past Surgical History:  Procedure Laterality Date   BACK SURGERY     BASAL CELL CARCINOMA EXCISION Left ~ 2012   face   CARDIAC CATHETERIZATION  02/2005   Dr. Mare Ferrari; negative.   CARDIAC CATHETERIZATION N/A 05/13/2016   Procedure: Left Heart Cath and Coronary Angiography;  Surgeon: Jerline Pain, MD;  Location: [facility];  Service: Cardiovascular;  Laterality: N/A;   CARPAL TUNNEL RELEASE Left 10/2009   CARPAL TUNNEL RELEASE Right ~ 2013   CATARACT EXTRACTION W/ INTRAOCULAR  LENS IMPLANT Left 2000's   CATARACT EXTRACTION W/ INTRAOCULAR LENS IMPLANT Right 2015   COLONOSCOPY W/ POLYPECTOMY     CORONARY ANGIOPLASTY WITH STENT PLACEMENT  06/26/2014   "1"   CORONARY ANGIOPLASTY WITH STENT PLACEMENT  06/26/2014   pLAD 50%, d LAD 60%, oD1 80%,  mD1  70%, CFX 50%, OM 2 70%,  RCA 80%, PDA 95% (<67m), OM1 99%-0% with Bio flow stent        CORONARY ANGIOPLASTY WITH STENT PLACEMENT  03/09/2018   CORONARY STENT INTERVENTION N/A 03/09/2018   Procedure: CORONARY STENT INTERVENTION;  Surgeon: MBurnell Blanks MD;  [facility];  Service: Cardiovascular;  Laterality: N/A;   ELBOW SURGERY Bilateral ~ 2014   "for blockages; Dr. GAmedeo Plenty   ENDARTERECTOMY Left 04/23/2015   Procedure: ENDARTERECTOMY CAROTID;  Surgeon: BConrad Cordova MD;  Location: [facility]  Service: Vascular;  Laterality: Left;   EYE SURGERY Left    "related go diabetes; laser"   KNEE ARTHROSCOPY Left 05/2011   LEFT HEART CATH AND CORONARY ANGIOGRAPHY N/A 03/09/2018   Procedure: LEFT HEART CATH AND  CORONARY ANGIOGRAPHY;  Surgeon: Burnell Blanks, MD;  Location: [facility];  Service: Cardiovascular;  Laterality: N/A;   LEFT HEART CATHETERIZATION WITH CORONARY ANGIOGRAM N/A 06/26/2014   Procedure: LEFT HEART CATHETERIZATION WITH CORONARY ANGIOGRAM;  Surgeon: Blane Ohara, MD;  Location: [facility];  Service: Cardiovascular;  Laterality: N/A;   LEFT HEART CATHETERIZATION WITH CORONARY ANGIOGRAM N/A 08/07/2014   Procedure: LEFT HEART CATHETERIZATION WITH CORONARY ANGIOGRAM;  Surgeon: Blane Ohara, MD;  Location[facility];  Service: Cardiovascular;  Laterality: N/A;   LUMBAR LAMINECTOMY/DECOMPRESSION MICRODISCECTOMY Left 11/02/2017   Procedure: MICRODISCECTOMY LUMBAR THREE- LUMBAR FOUR, LEFT;  Surgeon: Ashok Pall, MD;  Location: [place];  Service: Neurosurgery;  Laterality: Left;   NASAL SEPTUM SURGERY  1979   PATCH ANGIOPLASTY Left 04/23/2015   Procedure: PATCH ANGIOPLASTY  USING 1CM X 6CM XENOSURE BIOLOGIC PATCH;  Surgeon: Conrad Geneva, MD;  Location: [place];  Service: Vascular;  Laterality: Left;   [place]  10/14/2011   Procedure: ROTATOR CUFF REPAIR SHOULDER OPEN;  Surgeon: Johnn Hai, MD;  Location: [facility];  Service: Orthopedics;  Laterality: Right;   SUBACROMIAL DECOMPRESSION  10/14/2011   Procedure: SUBACROMIAL DECOMPRESSION;  Surgeon: Johnn Hai, MD;  Location: [facility];  Service: Orthopedics;  Laterality: Right;   TRIGGER FINGER RELEASE Right ~ 2013-2014   "3rd & 4th digits"   Patient Active Problem List   Diagnosis Date Noted   Coronary artery disease involving native coronary artery of native heart with angina pectoris ([place]) 03/07/2018   HNP (herniated nucleus pulposus), lumbar 11/02/2017   Carotid artery disease without cerebral infarction ([place]) 04/09/2015   Chest pain of uncertain etiology 60/73/7106   Diabetic neuropathy ([place]) 06/21/2014   Essential hypertension 06/21/2014   Hypercholesterolemia 06/21/2014   Rotator cuff tear 10/15/2011   IDDM (insulin dependent diabetes mellitus) 10/16/2008    PCP: Burnard Bunting, MD  REFERRING PROVIDER: Burnard Bunting, MD   REFERRING DIAG: M54.40 (ICD-10-CM) - Lumbago with sciatica, unspecified side   Rationale for Evaluation and Treatment Rehabilitation  THERAPY DIAG:  Muscle weakness (generalized)  Other low back pain  Difficulty in walking, not elsewhere classified  ONSET DATE: PT order 04/21/2022 / Surgery March 2022  SUBJECTIVE:                                                                                                                                                                                           SUBJECTIVE STATEMENT: Pt denies having any adverse effects after prior Rx.  Pt states his back has been tired and his  knees achy lately.  Pt hasn't been performing his HEP as much the past week.  He has difficulty and  weakness drying off after taking a shower.  He has difficulty descending stairs.  Pt tries to walk more without his cane.   FUNCTIONAL IMPROVEMENTS:  Pt states he can sometimes get up and walk without a cane.  Pt reports 30-40% improvement in pain and tolerance with daily mobility.   FUNCTIONAL LIMITATIONS:  weakness in back and legs.  Pt has difficulty with performing transfers and is limited with ambulation distance and standing duration. Stairs.  Standing to dry off after shower.  PERTINENT HISTORY:  -3 back surgeries:  L3-5 laminotomy and pt thinks he has some screws in lumbar March 2022, Lumbar laminectomy/decompression/microdisctomy in 2019, Lumbar surgery in 1964  -OA, Bilat knee pain R > L, CAD, Peripheral neuropathy, Vertigo, Pseudogout, DM type II and diabetic retinopathy,   -Pt has Insulin monitor and tremors in bilat Ue's R > L  -PSHx:  R shoulder surgery in 2013 and was unable to repair rotator cuff, Coronary angioplasty with stent placement in 2015 and 2019, and L knee arthroscopy in 2012    PAIN:  NPRS:  0/10 current, 5/10 worst Location:  bilat lumbar flanks and bilat knees Type:  Pt states he has weakness, not pain   PRECAUTIONS: Other: multiple back surgeries, peripheral neuropathy, R shoulder RCR  WEIGHT BEARING RESTRICTIONS No  FALLS:  Has patient fallen in last 6 months? No  LIVING ENVIRONMENT: Lives with: lives with their spouse Lives in: 1 story home Stairs: 3-4 steps with rail Has following equipment at home: Single point cane, Environmental consultant - 2 wheeled, and Crutches  OCCUPATION: Pt works from home and sets his own hours.  PLOF: Independent; Pt began using SPC in early 2019.  Pt was able to perform daily mobility and daily activities with greater ease and less difficulty  PATIENT GOALS improve mobility, get rid of cane, improve strength legs and back.  Improve lumbar stiffness.  Improve performance of stairs   OBJECTIVE:   DIAGNOSTIC FINDINGS:   FINDINGS: Segmentation:  Standard.   Alignment:  Trace anterolisthesis L3 on L4.   Vertebrae: No fracture, evidence of discitis, or bone lesion. There is some degenerative endplate signal change at L3-4.   Conus medullaris and cauda equina: Conus extends to the T12 level. Conus and cauda equina appear normal.   Paraspinal and other soft tissues: Negative.   Disc levels:   T11-12 and T12-L1 are imaged in the sagittal plane only and negative.   L1-2: Minimal disc bulge without stenosis.   L2-3: Shallow disc bulge and mild ligamentum flavum thickening. Mild central canal and left foraminal narrowing appear unchanged. The right foramen is open.   L3-4: Status post left laminotomy. There has been progression of disease at this level. A new and very large central left paracentral disc protrusion extends into the left foramen. There is severe compression of the thecal sac and narrowing in the lateral recesses, worse on the left. Moderate bilateral foraminal narrowing is worse on the left.   L4-5: Status post left laminotomy as seen on the prior exam. There is a shallow broad-based disc bulge. The narrowing in the subarticular recesses is present which could impact either descending L5 root. Mild bilateral foraminal narrowing. No change.   L5-S1: A right subarticular recess protrusion impinging on the right S1 root appears unchanged. Foramina are open.   IMPRESSION: Since the prior MRI, the patient has developed a large central  and left paracentral disc protrusion at L3-4 causing severe compression of the thecal sac and narrowing in the lateral recesses, worse on the left. Moderate bilateral foraminal narrowing at this level is worse on the left. Prior left laminotomy noted.   No change in a broad-based disc bulge at L4-5 causing narrowing in the subarticular recesses which could impact either descending L5 root. Left laminotomy defect is seen at this level.   No change in  a right subarticular recess protrusion at L5-S1. The disc appears to impinge on the right S1 root.     TODAY'S TREATMENT   Therapeutic Exercise: Reviewed response to prior Rx, current function, HEP compliance, and pain level.  Pt performed: Nustep just LE's L4 x 5 mins Supine SLR with TrA x10 and with contralateral UE ext x 10 each Standing hip abduction 2x5 with UE support on rail Standing heel raises with TrA 2x10 Sidestepping with UE support on wall 2x10--Pt had 1-2 LOB's with min assist/CGA Marching with TrA 2x10 Standing rows with RTB with TrA 2x10 Step ups with TrA on 4 inch step with UE support on rail x 5 each LE    Neuro Re-ed activities: Marching on airex 2x10 with UE support on rail Standing on airex without UE support with SBA x 30 sec with FA and 2x30 sec with NBOS with SBA/CGA. Step ups on airex x 10 each leg with UE support    PATIENT EDUCATION:  Education details:  HEP, POC, rationale of exercises, exercise form, relevant anatomy, and objective findings.  PT answered pt's questions. Person educated: Patient Education method: Explanation, Demonstration, Tactile cues, Verbal cues, and Handouts Education comprehension: verbalized understanding, returned demonstration, verbal cues required, tactile cues required, and needs further education   HOME EXERCISE PROGRAM: Access Code: [email or web address] URL: [email or web address] Date: 05/12/2022 Prepared by: Ronny Flurry  Exercises - Supine Transversus Abdominis Bracing - Hands on Stomach  - 2 x daily - 7 x weekly - 2 sets - 10 reps - 5 seconds hold - Supine March  - 1 x daily - 7 x weekly - 2 sets - 10 reps - Hooklying Clamshell with Resistance  - 1 x daily - 4-5 x weekly - 2 sets - 10 reps - Supine Active Straight Leg Raise  - 1 x daily - 5-6 x weekly - 2 sets - 10 reps - Clamshell  - 1 x daily - 5-6 x weekly - 2 sets - 10 reps  ASSESSMENT:  CLINICAL IMPRESSION: Pt is progressing well with pain, sx's,  strength, and function.  Pt is improving with tolerance to exercises.  He demonstrates improved strength as evidenced by performance and progression of exercises.  Pt took seated rest breaks and PT did limit standing duration in order to not irritate his knees.  Pt is motivated and gives good effort with all exercises.  Pt responded well to Rx having no c/o's after Rx.  He should benefit from cont skilled PT services to address ongoing goals, improve strength, and to improve overall function.     OBJECTIVE IMPAIRMENTS Abnormal gait, decreased activity tolerance, decreased endurance, decreased mobility, difficulty walking, decreased strength, impaired flexibility, and pain.   ACTIVITY LIMITATIONS standing, squatting, stairs, transfers, bed mobility, and locomotion level  PARTICIPATION LIMITATIONS: meal prep, cleaning, and community activity  PERSONAL FACTORS 3+ comorbidities: multiple back surgeries, OA, Bilat knee pain R > L, Peripheral neuropathy, Pseudogout, DM type II, and L knee scope   are also affecting patient's functional outcome.   REHAB POTENTIAL: Good  CLINICAL DECISION MAKING: Evolving/moderate complexity  EVALUATION COMPLEXITY: Moderate   GOALS:   SHORT TERM GOALS: Target date: 06/02/2022  Pt will be independent with HEP for improved pain, strength, and function.  Baseline: Goal status: Goal met  2.  Pt will tolerate aquatic therapy without adverse effects for improved strength and tolerance to activity.  Baseline:  Goal status: not met Target date: 05/26/2022  3.  Pt will demo improved gait speed and increased step length.  Baseline:  Goal status: goal met  4.  Pt will report at least a 25% improvement in pain and tolerance to daily mobility.  Baseline:  Goal status: Goal met    LONG TERM GOALS: Target date: 08/10/2022  Pt will be able to perform his daily transfers without difficultly.  Baseline:  Goal status: ongoing  2.  Pt will demo at least a 6-8 lb  increase in bilat hip flexion and hip abd strength and L knee flexion to 5/5 MMT for improved performance of functional mobility skills including ambulation, stairs, and transfers.  Baseline:  Goal status: progressing  3.  Pt will report improved standing tolerance with ADLs/IADLs including preparing food and drying off after a shower.   Baseline:  Goal status: Ongoing  4.  Pt will report improved tolerance with community ambulation and the ability to ambulate his normal distance without significant difficulty and pain.   Baseline:  Goal status: progressing   PLAN: PT FREQUENCY: 2x/week  PT DURATION: 6 weeks  PLANNED INTERVENTIONS: Therapeutic exercises, Therapeutic activity, Neuromuscular re-education, Balance training, Gait training, Patient/Family education, Self Care, Joint mobilization, Stair training, Aquatic Therapy, Dry Needling, Electrical stimulation, Cryotherapy, Moist heat, Taping, Manual therapy, and Re-evaluation.  PLAN FOR NEXT SESSION:   Cont with land therapy.  Core, LE strengthening, and proprioceptive activities.     Selinda Michaels III PT, DPT 08/05/22 2:14 PM

## 2022-08-11 ENCOUNTER — Encounter (HOSPITAL_BASED_OUTPATIENT_CLINIC_OR_DEPARTMENT_OTHER): Payer: Self-pay | Admitting: Physical Therapy

## 2022-08-11 ENCOUNTER — Ambulatory Visit (HOSPITAL_BASED_OUTPATIENT_CLINIC_OR_DEPARTMENT_OTHER): Payer: Medicare Other | Attending: Internal Medicine | Admitting: Physical Therapy

## 2022-08-11 DIAGNOSIS — R262 Difficulty in walking, not elsewhere classified: Secondary | ICD-10-CM | POA: Insufficient documentation

## 2022-08-11 DIAGNOSIS — M5459 Other low back pain: Secondary | ICD-10-CM | POA: Diagnosis not present

## 2022-08-11 DIAGNOSIS — M6281 Muscle weakness (generalized): Secondary | ICD-10-CM | POA: Insufficient documentation

## 2022-08-11 NOTE — Therapy (Signed)
OUTPATIENT PHYSICAL THERAPY  TREATMENT NOTE  / PROGRESS NOTE  Progress Note Reporting Period 07/14/2022 to 08/11/2022  See note below for Objective Data and Assessment of Progress/Goals.          Patient Name: Seth Young MRN: [id number] DOB:[birth year], 82 y.o., male Today's Date: 08/12/2022   PT End of Session - 08/11/22 1309     Visit Number 10    Number of Visits 20    Date for PT Re-Evaluation 09/22/22    Authorization Type [employer] MCR    PT Start Time 1308    PT Stop Time 1348    PT Time Calculation (min) 40 min    Activity Tolerance Patient tolerated treatment well    Behavior During Therapy WFL for tasks assessed/performed                Past Medical History:  Diagnosis Date   Arthritis    "knees, toes, hands, probably in my back" (03/09/2018)   Ataxia    Basal cell carcinoma    left temporal area-no residual   Colon polyps 10-12-11   past hx.   Coronary artery disease    Exertional angina 06/26/2014   Cath with DES to the OM, Bioflow protocol    GERD (gastroesophageal reflux disease)    H/O hiatal hernia    no problems   High cholesterol    History of echocardiogram    Echo 7/19:  Mild LVH, mild focal basal septal hypertrophy, EF 60-65, no RWMA, Gr 1 DD, trivial AI, MAC   Hypertension    Ischemic chest pain (HCC) 08/07/2014   Occasional tremors    Bilateral hands   Peripheral neuropathy    Pneumonia 1940's X 2; ~ 2017   "infant; walking pneumonia" (03/09/2018)   Pseudogout    "it moves around"   Sleep apnea    no cpap used   Type II diabetes mellitus ([place]) dx'd 1982   nsulin started ~ 2000   Vertigo    Past Surgical History:  Procedure Laterality Date   BACK SURGERY     BASAL CELL CARCINOMA EXCISION Left ~ 2012   face   CARDIAC CATHETERIZATION  02/2005   Dr. Mare Ferrari; negative.   CARDIAC CATHETERIZATION N/A 05/13/2016   Procedure: Left Heart Cath and Coronary Angiography;  Surgeon: Jerline Pain, MD;  Location: [facility];  Service:  Cardiovascular;  Laterality: N/A;   CARPAL TUNNEL RELEASE Left 10/2009   CARPAL TUNNEL RELEASE Right ~ 2013   CATARACT EXTRACTION W/ INTRAOCULAR LENS IMPLANT Left 2000's   CATARACT EXTRACTION W/ INTRAOCULAR LENS IMPLANT Right 2015   COLONOSCOPY W/ POLYPECTOMY     CORONARY ANGIOPLASTY WITH STENT PLACEMENT  06/26/2014   "1"   CORONARY ANGIOPLASTY WITH STENT PLACEMENT  06/26/2014   pLAD 50%, d LAD 60%, oD1 80%,  mD1  70%, CFX 50%, OM 2 70%,  RCA 80%, PDA 95% (<17m), OM1 99%-0% with Bio flow stent        CORONARY ANGIOPLASTY WITH STENT PLACEMENT  03/09/2018   CORONARY STENT INTERVENTION N/A 03/09/2018   Procedure: CORONARY STENT INTERVENTION;  Surgeon: MBurnell Blanks MD;  [facility];  Service: Cardiovascular;  Laterality: N/A;   ELBOW SURGERY Bilateral ~ 2014   "for blockages; Dr. GAmedeo Plenty   ENDARTERECTOMY Left 04/23/2015   Procedure: ENDARTERECTOMY CAROTID;  Surgeon: BConrad [place] MD;  [facility]  Service: Vascular;  Laterality: Left;   EYE SURGERY Left    "related go  diabetes; laser"   KNEE ARTHROSCOPY Left 05/2011   LEFT HEART CATH AND CORONARY ANGIOGRAPHY N/A 03/09/2018   Procedure: LEFT HEART CATH AND CORONARY ANGIOGRAPHY;  Surgeon: Burnell Blanks, MD;  Location: [place] [facility];  Service: Cardiovascular;  Laterality: N/A;   LEFT HEART CATHETERIZATION WITH CORONARY ANGIOGRAM N/A 06/26/2014   Procedure: LEFT HEART CATHETERIZATION WITH CORONARY ANGIOGRAM;  Surgeon: Blane Ohara, MD;  [facility];  Service: Cardiovascular;  Laterality: N/A;   LEFT HEART CATHETERIZATION WITH CORONARY ANGIOGRAM N/A 08/07/2014   Procedure: LEFT HEART CATHETERIZATION WITH CORONARY ANGIOGRAM;  Surgeon: Blane Ohara, MD;  Location: [facility];  Service: Cardiovascular;  Laterality: N/A;   LUMBAR LAMINECTOMY/DECOMPRESSION MICRODISCECTOMY Left 11/02/2017   Procedure: MICRODISCECTOMY LUMBAR THREE- LUMBAR FOUR, LEFT;  Surgeon: Ashok Pall, MD;  Location: [place];  Service: Neurosurgery;  Laterality: Left;   NASAL SEPTUM SURGERY  1979   PATCH ANGIOPLASTY Left 04/23/2015   Procedure: PATCH ANGIOPLASTY USING 1CM X 6CM XENOSURE BIOLOGIC PATCH;  Surgeon: Conrad [place], MD;  Location: [place];  Service: Vascular;  Laterality: Left;   [place]  10/14/2011   Procedure: ROTATOR CUFF REPAIR SHOULDER OPEN;  Surgeon: Johnn Hai, MD;  Location: [facility];  Service: Orthopedics;  Laterality: Right;   SUBACROMIAL DECOMPRESSION  10/14/2011   Procedure: SUBACROMIAL DECOMPRESSION;  Surgeon: Johnn Hai, MD;  Location: [facility];  Service: Orthopedics;  Laterality: Right;   TRIGGER FINGER RELEASE Right ~ 2013-2014   "3rd & 4th digits"   Patient Active Problem List   Diagnosis Date Noted   Coronary artery disease involving native coronary artery of native heart with angina pectoris ([place]) 03/07/2018   HNP (herniated nucleus pulposus), lumbar 11/02/2017   Carotid artery disease without cerebral infarction ([place]) 04/09/2015   Chest pain of uncertain etiology 34/35/6861   Diabetic neuropathy ([place]) 06/21/2014   Essential hypertension 06/21/2014   Hypercholesterolemia 06/21/2014   Rotator cuff tear 10/15/2011   IDDM (insulin dependent diabetes mellitus) 10/16/2008    PCP: Burnard Bunting, MD  REFERRING PROVIDER: Burnard Bunting, MD   REFERRING DIAG: M54.40 (ICD-10-CM) - Lumbago with sciatica, unspecified side   Rationale for Evaluation and Treatment Rehabilitation  THERAPY DIAG:  Muscle weakness (generalized)  Other low back pain  Difficulty in walking, not elsewhere classified  ONSET DATE: PT order 04/21/2022 / Surgery March 2022  SUBJECTIVE:  SUBJECTIVE STATEMENT: Pt denies having any adverse effects after prior Rx.   He has difficulty and weakness drying off after taking a shower.  He has difficulty descending stairs.  Pt tries to walk more without his cane.   RESPONSE TO PRIOR RX:  Pt denies any adverse effects after prior Rx.  Pt states he could walk better after prior Rx.   FUNCTIONAL IMPROVEMENTS:  "I make progress each time I come here" Pt states he can sometimes get up and walk without a cane.  Pt reports 30-40% improvement in pain and tolerance with daily mobility.   FUNCTIONAL LIMITATIONS:  weakness in back and legs.  Pt has difficulty with performing transfers and is limited with ambulation distance and standing duration. Stairs.  Standing to dry off after shower.  PERTINENT HISTORY:  -3 back surgeries:  L3-5 laminotomy and pt thinks he has some screws in lumbar March 2022, Lumbar laminectomy/decompression/microdisctomy in 2019, Lumbar surgery in 1964  -OA, Bilat knee pain R > L, CAD, Peripheral neuropathy, Vertigo, Pseudogout, DM type II and diabetic retinopathy,   -Pt has Insulin monitor and tremors in bilat Ue's R > L  -PSHx:  R shoulder surgery in 2013 and was unable to repair rotator cuff, Coronary angioplasty with stent placement in 2015 and 2019, and L knee arthroscopy in 2012    PAIN:  NPRS:  0/10 current, 3/10 worst Location:  lumbar  Type:  Pt states he has weakness, not pain   PRECAUTIONS: Other: multiple back surgeries, peripheral neuropathy, R shoulder RCR  WEIGHT BEARING RESTRICTIONS No  FALLS:  Has patient fallen in last 6 months? No  LIVING ENVIRONMENT: Lives with: lives with their spouse Lives in: 1 story home Stairs: 3-4 steps with rail Has following equipment at home: Single point cane, Environmental consultant - 2 wheeled, and Crutches  OCCUPATION: Pt works from home and sets his own hours.  PLOF: Independent; Pt began using SPC in early 2019.  Pt was able to perform daily mobility and daily activities with greater ease and less difficulty  PATIENT GOALS improve mobility, get  rid of cane, improve strength legs and back.  Improve lumbar stiffness.  Improve performance of stairs   OBJECTIVE:   DIAGNOSTIC FINDINGS:  FINDINGS: Segmentation:  Standard.   Alignment:  Trace anterolisthesis L3 on L4.   Vertebrae: No fracture, evidence of discitis, or bone lesion. There is some degenerative endplate signal change at L3-4.   Conus medullaris and cauda equina: Conus extends to the T12 level. Conus and cauda equina appear normal.   Paraspinal and other soft tissues: Negative.   Disc levels:   T11-12 and T12-L1 are imaged in the sagittal plane only and negative.   L1-2: Minimal disc bulge without stenosis.   L2-3: Shallow disc bulge and mild ligamentum flavum thickening. Mild central canal and left foraminal narrowing appear unchanged. The right foramen is open.   L3-4: Status post left laminotomy. There has been progression of disease at this level. A new and very large central left paracentral disc protrusion extends into the left foramen. There is severe compression of the thecal sac and narrowing in the lateral recesses, worse on the left. Moderate bilateral foraminal narrowing is worse on the left.   L4-5: Status post left laminotomy as seen on the prior exam. There is a shallow broad-based disc bulge. The narrowing in the subarticular recesses is present which could impact either descending L5 root. Mild bilateral foraminal narrowing. No change.   L5-S1: A right subarticular recess protrusion  impinging on the right S1 root appears unchanged. Foramina are open.   IMPRESSION: Since the prior MRI, the patient has developed a large central and left paracentral disc protrusion at L3-4 causing severe compression of the thecal sac and narrowing in the lateral recesses, worse on the left. Moderate bilateral foraminal narrowing at this level is worse on the left. Prior left laminotomy noted.   No change in a broad-based disc bulge at L4-5 causing  narrowing in the subarticular recesses which could impact either descending L5 root. Left laminotomy defect is seen at this level.   No change in a right subarticular recess protrusion at L5-S1. The disc appears to impinge on the right S1 root.     TODAY'S TREATMENT   Reviewed current function and pain level. PT assessed goals and educated pt with progress.   PATIENT SURVEYS:  FOTO Initial/ Prior/ Current:  19 / 45 / 63.  Goal of 48 at visit #13   LOWER EXTREMITY MMT:     MMT and HHD Right eval Left eval Right 11/21 Left 11/21 Right 1/3 Left 1/3  Hip flexion 4+/5 ; 21.2 4/5 ; 19.8 4+/5 ; 26.2 4/5 ; 23.6 4+/5 ; 26.2  4+/5; 23.5  Hip extension            Hip abduction 22.0 21.8 21.9 23.8 25.1 29.1  Hip adduction            Hip internal rotation            Hip external rotation 4+/5 4+/5 4+/5 4+/5 4+/5 4+/5  Knee flexion 5/5 tested in sitting 4/5 tested in sitting   4/5 tested in sitting 4/5 tested in sitting 4/5 tested in sitting  Knee extension 5/5 5/5 5/5 5/5 5/5 5/5  Ankle dorsiflexion 5/5 5/5 5/5 5/5    Ankle plantarflexion Able to tolerate resistance though weak in sitting Able to tolerate resistance though weak in sitting Able to tolerate resistance though weak in sitting WFL    Ankle inversion            Ankle eversion             (Blank rows = not tested)         GAIT: Assistive device utilized: Single point cane on R  Level of assistance: Complete Independence Comments:  Improved gait speed and improved step length bilat.  bilat toe out R > L   Pt demonstrates improved gait including speed and stability without SPC though still has increased favoring of R LE.      Therapeutic Exercise: Reviewed response to prior Rx.  Pt had questions concerning aquatic therapy.  PT took pt to the pool area and answered his questions satisfactorily.   Pt performed: Nustep with bilat UE/LE's L4 x 6 mins Sidestepping with UE support on rail x 2 laps     Marching on  airex with TrA 2x10 Standing hip abduction 2x5 with UE support on rail Standing heel raises with TrA 2x10      PATIENT EDUCATION:  Education details:  Aquatic therapy process, HEP, POC, rationale of exercises, exercise form, relevant anatomy, and objective findings.  PT answered pt's questions. Person educated: Patient Education method: Explanation, Demonstration, Tactile cues, Verbal cues, and Handouts Education comprehension: verbalized understanding, returned demonstration, verbal cues required, tactile cues required, and needs further education   HOME EXERCISE PROGRAM: Access Code: [email or web address] URL: [email or web address] Date: 05/12/2022 Prepared by: Ronny Flurry  Exercises - Supine Transversus Abdominis Bracing - Hands on Stomach  -  2 x daily - 7 x weekly - 2 sets - 10 reps - 5 seconds hold - Supine March  - 1 x daily - 7 x weekly - 2 sets - 10 reps - Hooklying Clamshell with Resistance  - 1 x daily - 4-5 x weekly - 2 sets - 10 reps - Supine Active Straight Leg Raise  - 1 x daily - 5-6 x weekly - 2 sets - 10 reps - Clamshell  - 1 x daily - 5-6 x weekly - 2 sets - 10 reps  ASSESSMENT:  CLINICAL IMPRESSION: Pt has only been seen in PT for 3 visits since prior PN due to the holidays and scheduling issues due to clinic availability.  Pt states he has more weakness in his back than pain.  He states he is making progress.  Pt continues to be limited with standing tolerance which affect the performance of his ADLs/IADLs.  Pt is trying to ambulate more without his cane and continues to have difficulty descending stairs.  Pt is improving with gait though continues to have decreased gait speed and favoring of R LE.  Pt's strength is about the same as last PN except bilat hip abduction strength has improved.  He is motivated and gives good effort with all exercises.  Pt does have knee pain which affects his mobility.  PT educated pt concerning the aquatic process and answered all  of pt's questions.  Pt had no significant change in goal progress but has only been seen for 3 visits since last PN.  Pt is progressing toward goals.  He should benefit from cont skilled PT services to address ongoing goals, improve strength, and to improve overall function.     OBJECTIVE IMPAIRMENTS Abnormal gait, decreased activity tolerance, decreased endurance, decreased mobility, difficulty walking, decreased strength, impaired flexibility, and pain.   ACTIVITY LIMITATIONS standing, squatting, stairs, transfers, bed mobility, and locomotion level  PARTICIPATION LIMITATIONS: meal prep, cleaning, and community activity  PERSONAL FACTORS 3+ comorbidities: multiple back surgeries, OA, Bilat knee pain R > L, Peripheral neuropathy, Pseudogout, DM type II, and L knee scope   are also affecting patient's functional outcome.   REHAB POTENTIAL: Good  CLINICAL DECISION MAKING: Evolving/moderate complexity  EVALUATION COMPLEXITY: Moderate   GOALS:   SHORT TERM GOALS: Target date: 06/02/2022  Pt will be independent with HEP for improved pain, strength, and function.  Baseline: Goal status: Goal met  2.  Pt will tolerate aquatic therapy without adverse effects for improved strength and tolerance to activity.  Baseline:  Goal status: not met Target date: 05/26/2022  3.  Pt will demo improved gait speed and increased step length.  Baseline:  Goal status: goal met  4.  Pt will report at least a 25% improvement in pain and tolerance to daily mobility.  Baseline:  Goal status: Goal met    LONG TERM GOALS: Target date: 09/22/2022   Pt will be able to perform his daily transfers without difficultly.  Baseline:  Goal status: ongoing  2.  Pt will demo at least a 6-8 lb increase in bilat hip flexion and hip abd strength and L knee flexion to 5/5 MMT for improved performance of functional mobility skills including ambulation, stairs, and transfers.  Baseline:  Goal status:  progressing  3.  Pt will report improved standing tolerance with ADLs/IADLs including preparing food and drying off after a shower.   Baseline:  Goal status: Ongoing  4.  Pt will report improved tolerance with community ambulation and  the ability to ambulate his normal distance without significant difficulty and pain.   Baseline:  Goal status: progressing   PLAN: PT FREQUENCY: 2x/week  PT DURATION: 6 weeks  PLANNED INTERVENTIONS: Therapeutic exercises, Therapeutic activity, Neuromuscular re-education, Balance training, Gait training, Patient/Family education, Self Care, Joint mobilization, Stair training, Aquatic Therapy, Dry Needling, Electrical stimulation, Cryotherapy, Moist heat, Taping, Manual therapy, and Re-evaluation.  PLAN FOR NEXT SESSION:   Cont with land therapy.  Core, LE strengthening, and proprioceptive activities.  Pt may try some aquatic therapy   Selinda Michaels III PT, DPT 08/12/22 12:20 PM

## 2022-08-15 DIAGNOSIS — E1129 Type 2 diabetes mellitus with other diabetic kidney complication: Secondary | ICD-10-CM | POA: Diagnosis not present

## 2022-08-18 ENCOUNTER — Ambulatory Visit (HOSPITAL_BASED_OUTPATIENT_CLINIC_OR_DEPARTMENT_OTHER): Payer: Medicare Other | Admitting: Physical Therapy

## 2022-08-18 ENCOUNTER — Encounter (HOSPITAL_BASED_OUTPATIENT_CLINIC_OR_DEPARTMENT_OTHER): Payer: Self-pay | Admitting: Physical Therapy

## 2022-08-18 DIAGNOSIS — R262 Difficulty in walking, not elsewhere classified: Secondary | ICD-10-CM

## 2022-08-18 DIAGNOSIS — M6281 Muscle weakness (generalized): Secondary | ICD-10-CM

## 2022-08-18 DIAGNOSIS — M5459 Other low back pain: Secondary | ICD-10-CM

## 2022-08-18 NOTE — Therapy (Signed)
OUTPATIENT PHYSICAL THERAPY  TREATMENT NOTE  / PROGRESS NOTE  Progress Note Reporting Period 07/14/2022 to 08/11/2022  See note below for Objective Data and Assessment of Progress/Goals.          Patient Name: Seth Young MRN: [id number] DOB:[birth year], 82 y.o., male Today's Date: 08/12/2022   PT End of Session - 08/11/22 1309     Visit Number 10    Number of Visits 20    Date for PT Re-Evaluation 09/22/22    Authorization Type [employer] MCR    PT Start Time 1308    PT Stop Time 1348    PT Time Calculation (min) 40 min    Activity Tolerance Patient tolerated treatment well    Behavior During Therapy WFL for tasks assessed/performed                Past Medical History:  Diagnosis Date   Arthritis    "knees, toes, hands, probably in my back" (03/09/2018)   Ataxia    Basal cell carcinoma    left temporal area-no residual   Colon polyps 10-12-11   past hx.   Coronary artery disease    Exertional angina 06/26/2014   Cath with DES to the OM, Bioflow protocol    GERD (gastroesophageal reflux disease)    H/O hiatal hernia    no problems   High cholesterol    History of echocardiogram    Echo 7/19:  Mild LVH, mild focal basal septal hypertrophy, EF 60-65, no RWMA, Gr 1 DD, trivial AI, MAC   Hypertension    Ischemic chest pain (HCC) 08/07/2014   Occasional tremors    Bilateral hands   Peripheral neuropathy    Pneumonia 1940's X 2; ~ 2017   "infant; walking pneumonia" (03/09/2018)   Pseudogout    "it moves around"   Sleep apnea    no cpap used   Type II diabetes mellitus ([place]) dx'd 1982   nsulin started ~ 2000   Vertigo    Past Surgical History:  Procedure Laterality Date   BACK SURGERY     BASAL CELL CARCINOMA EXCISION Left ~ 2012   face   CARDIAC CATHETERIZATION  02/2005   Dr. Mare Ferrari; negative.   CARDIAC CATHETERIZATION N/A 05/13/2016   Procedure: Left Heart Cath and Coronary Angiography;  Surgeon: Jerline Pain, MD;  Location: [facility];  Service:  Cardiovascular;  Laterality: N/A;   CARPAL TUNNEL RELEASE Left 10/2009   CARPAL TUNNEL RELEASE Right ~ 2013   CATARACT EXTRACTION W/ INTRAOCULAR LENS IMPLANT Left 2000's   CATARACT EXTRACTION W/ INTRAOCULAR LENS IMPLANT Right 2015   COLONOSCOPY W/ POLYPECTOMY     CORONARY ANGIOPLASTY WITH STENT PLACEMENT  06/26/2014   "1"   CORONARY ANGIOPLASTY WITH STENT PLACEMENT  06/26/2014   pLAD 50%, d LAD 60%, oD1 80%,  mD1  70%, CFX 50%, OM 2 70%,  RCA 80%, PDA 95% (<17m), OM1 99%-0% with Bio flow stent        CORONARY ANGIOPLASTY WITH STENT PLACEMENT  03/09/2018   CORONARY STENT INTERVENTION N/A 03/09/2018   Procedure: CORONARY STENT INTERVENTION;  Surgeon: MBurnell Blanks MD;  [facility];  Service: Cardiovascular;  Laterality: N/A;   ELBOW SURGERY Bilateral ~ 2014   "for blockages; Dr. GAmedeo Plenty   ENDARTERECTOMY Left 04/23/2015   Procedure: ENDARTERECTOMY CAROTID;  Surgeon: BConrad [place] MD;  [facility]  Service: Vascular;  Laterality: Left;   EYE SURGERY Left    "related go  diabetes; laser"   KNEE ARTHROSCOPY Left 05/2011   LEFT HEART CATH AND CORONARY ANGIOGRAPHY N/A 03/09/2018   Procedure: LEFT HEART CATH AND CORONARY ANGIOGRAPHY;  Surgeon: Burnell Blanks, MD;  Location: [facility];  Service: Cardiovascular;  Laterality: N/A;   LEFT HEART CATHETERIZATION WITH CORONARY ANGIOGRAM N/A 06/26/2014   Procedure: LEFT HEART CATHETERIZATION WITH CORONARY ANGIOGRAM;  Surgeon: Blane Ohara, MD;  Location: [facility];  Service: Cardiovascular;  Laterality: N/A;   LEFT HEART CATHETERIZATION WITH CORONARY ANGIOGRAM N/A 08/07/2014   Procedure: LEFT HEART CATHETERIZATION WITH CORONARY ANGIOGRAM;  Surgeon: Blane Ohara, MD;  Location: [facility] C[facility];  Service: Cardiovascular;  Laterality: N/A;   LUMBAR LAMINECTOMY/DECOMPRESSION MICRODISCECTOMY Left 11/02/2017   Procedure: MICRODISCECTOMY LUMBAR THREE- LUMBAR FOUR, LEFT;  Surgeon: Ashok Pall, MD;  Location: [place];  Service: Neurosurgery;  Laterality: Left;   NASAL SEPTUM SURGERY  1979   PATCH ANGIOPLASTY Left 04/23/2015   Procedure: PATCH ANGIOPLASTY USING 1CM X 6CM XENOSURE BIOLOGIC PATCH;  Surgeon: Conrad Wilson, MD;  Location: [facility];  Service: Vascular;  Laterality: Left;   [place]  10/14/2011   Procedure: ROTATOR CUFF REPAIR SHOULDER OPEN;  Surgeon: Johnn Hai, MD;  Location: [facility];  Service: Orthopedics;  Laterality: Right;   SUBACROMIAL DECOMPRESSION  10/14/2011   Procedure: SUBACROMIAL DECOMPRESSION;  Surgeon: Johnn Hai, MD;  Location: [facility];  Service: Orthopedics;  Laterality: Right;   TRIGGER FINGER RELEASE Right ~ 2013-2014   "3rd & 4th digits"   Patient Active Problem List   Diagnosis Date Noted   Coronary artery disease involving native coronary artery of native heart with angina pectoris ([place]) 03/07/2018   HNP (herniated nucleus pulposus), lumbar 11/02/2017   Carotid artery disease without cerebral infarction ([place]) 04/09/2015   Chest pain of uncertain etiology 80/99/8338   Diabetic neuropathy ([place]) 06/21/2014   Essential hypertension 06/21/2014   Hypercholesterolemia 06/21/2014   Rotator cuff tear 10/15/2011   IDDM (insulin dependent diabetes mellitus) 10/16/2008    PCP: Burnard Bunting, MD  REFERRING PROVIDER: Burnard Bunting, MD   REFERRING DIAG: M54.40 (ICD-10-CM) - Lumbago with sciatica, unspecified side   Rationale for Evaluation and Treatment Rehabilitation  THERAPY DIAG:  Muscle weakness (generalized)  Other low back pain  Difficulty in walking, not elsewhere classified  ONSET DATE: PT order 04/21/2022 / Surgery March 2022  SUBJECTIVE:  SUBJECTIVE STATEMENT: Pt denies having any adverse effects after prior Rx.   Pt is able to stand longer.  Pt states he has been performing more leg raises and he thinks that has improved his strength to stand better.  He has difficulty and weakness drying off after taking a shower.  Pt states he doesn't think he can perform aquatic therapy due to his difficulty with drying off after the pool.  He states the changing room doesn't offer him enough support.  RESPONSE TO PRIOR RX:  Pt denies any adverse effects after prior Rx.  Pt states he could walk better after prior Rx.   FUNCTIONAL IMPROVEMENTS:  "I make progress each time I come here" Pt states he can sometimes get up and walk without a cane.  Pt reports 30-40% improvement in pain and tolerance with daily mobility.   FUNCTIONAL LIMITATIONS:  weakness in back and legs.  Pt has difficulty with performing transfers and is limited with ambulation distance and standing duration. Stairs.  Standing to dry off after shower.  PERTINENT HISTORY:  -3 back surgeries:  L3-5 laminotomy and pt thinks he has some screws in lumbar March 2022, Lumbar laminectomy/decompression/microdisctomy in 2019, Lumbar surgery in 1964  -OA, Bilat knee pain R > L, CAD, Peripheral neuropathy, Vertigo, Pseudogout, DM type II and diabetic retinopathy,   -Pt has Insulin monitor and tremors in bilat Ue's R > L  -PSHx:  R shoulder surgery in 2013 and was unable to repair rotator cuff, Coronary angioplasty with stent placement in 2015 and 2019, and L knee arthroscopy in 2012    PAIN:  NPRS:  No pain, just some discomfort Location:  lumbar    PRECAUTIONS: Other: multiple back surgeries, peripheral neuropathy, R shoulder RCR  WEIGHT BEARING RESTRICTIONS No  FALLS:  Has patient fallen in last 6 months? No  LIVING ENVIRONMENT: Lives with: lives with their spouse Lives in: 1 story home Stairs: 3-4 steps with rail Has following equipment at home: Single point cane, Environmental consultant - 2 wheeled, and Crutches  OCCUPATION: Pt works from home and sets his own  hours.  PLOF: Independent; Pt began using SPC in early 2019.  Pt was able to perform daily mobility and daily activities with greater ease and less difficulty  PATIENT GOALS improve mobility, get rid of cane, improve strength legs and back.  Improve lumbar stiffness.  Improve performance of stairs   OBJECTIVE:   DIAGNOSTIC FINDINGS:  FINDINGS: Segmentation:  Standard.   Alignment:  Trace anterolisthesis L3 on L4.   Vertebrae: No fracture, evidence of discitis, or bone lesion. There is some degenerative endplate signal change at L3-4.   Conus medullaris and cauda equina: Conus extends to the T12 level. Conus and cauda equina appear normal.   Paraspinal and other soft tissues: Negative.   Disc levels:   T11-12 and T12-L1 are imaged in the sagittal plane only and negative.   L1-2: Minimal disc bulge without stenosis.   L2-3: Shallow disc bulge and mild ligamentum flavum thickening. Mild central canal and left foraminal narrowing appear unchanged. The right foramen is open.   L3-4: Status post left laminotomy. There has been progression of disease at this level. A new and very large central left paracentral disc protrusion extends into the left foramen. There is severe compression of the thecal sac and narrowing in the lateral recesses, worse on the left. Moderate bilateral foraminal narrowing is worse on the left.   L4-5: Status post left laminotomy as seen on the prior exam.  There is a shallow broad-based disc bulge. The narrowing in the subarticular recesses is present which could impact either descending L5 root. Mild bilateral foraminal narrowing. No change.   L5-S1: A right subarticular recess protrusion impinging on the right S1 root appears unchanged. Foramina are open.   IMPRESSION: Since the prior MRI, the patient has developed a large central and left paracentral disc protrusion at L3-4 causing severe compression of the thecal sac and narrowing in the  lateral recesses, worse on the left. Moderate bilateral foraminal narrowing at this level is worse on the left. Prior left laminotomy noted.   No change in a broad-based disc bulge at L4-5 causing narrowing in the subarticular recesses which could impact either descending L5 root. Left laminotomy defect is seen at this level.   No change in a right subarticular recess protrusion at L5-S1. The disc appears to impinge on the right S1 root.     TODAY'S TREATMENT   Reviewed current function and pain level. PT assessed goals and educated pt with progress.   PATIENT SURVEYS:  FOTO Initial/ Prior/ Current:  44 / 64 / 17.  Goal of 48 at visit #13   LOWER EXTREMITY MMT:     MMT and HHD Right eval Left eval Right 11/21 Left 11/21 Right 1/3 Left 1/3  Hip flexion 4+/5 ; 21.2 4/5 ; 19.8 4+/5 ; 26.2 4/5 ; 23.6 4+/5 ; 26.2  4+/5; 23.5  Hip extension            Hip abduction 22.0 21.8 21.9 23.8 25.1 29.1  Hip adduction            Hip internal rotation            Hip external rotation 4+/5 4+/5 4+/5 4+/5 4+/5 4+/5  Knee flexion 5/5 tested in sitting 4/5 tested in sitting   4/5 tested in sitting 4/5 tested in sitting 4/5 tested in sitting  Knee extension 5/5 5/5 5/5 5/5 5/5 5/5  Ankle dorsiflexion 5/5 5/5 5/5 5/5    Ankle plantarflexion Able to tolerate resistance though weak in sitting Able to tolerate resistance though weak in sitting Able to tolerate resistance though weak in sitting WFL    Ankle inversion            Ankle eversion             (Blank rows = not tested)         GAIT: Assistive device utilized: Single point cane on R  Level of assistance: Complete Independence Comments:  Improved gait speed and improved step length bilat.  bilat toe out R > L   Pt demonstrates improved gait including speed and stability without SPC though still has increased favoring of R LE.      Therapeutic Exercise: Reviewed response to prior Rx, pain level, and .  Pt had questions  concerning aquatic therapy.  PT took pt to the pool area and answered his questions satisfactorily.   Pt performed: Nustep with bilat UE/LE's L4 x 6 mins Sidestepping with UE support on rail x 2 laps and with RTB around thighs x 2 laps     Marching on airex with TrA 2x10 Standing hip abduction 2x10 with UE support on rail Standing heel raises with TrA 2x10   Step ups with TrA on 4 inch step with UE support on rail x 10 each LE   Standing rows with RTB with TrA 2x10   Standing shoulder extension with RTB 2x10 with TrA  Supine SLR with contralateral UE extension with TrA 2x10    Standing on airex without UE support with SBA 2 x 30 sec with FA and 2x30 sec with NBOS with SBA/CGA.       PATIENT EDUCATION:  Education details:  Aquatic therapy process, HEP, POC, rationale of exercises, exercise form, relevant anatomy, and objective findings.  PT answered pt's questions. Person educated: Patient Education method: Explanation, Demonstration, Tactile cues, Verbal cues, and Handouts Education comprehension: verbalized understanding, returned demonstration, verbal cues required, tactile cues required, and needs further education   HOME EXERCISE PROGRAM: Access Code: [email or web address] URL: [email or web address] Date: 05/12/2022 Prepared by: Ronny Flurry  Exercises - Supine Transversus Abdominis Bracing - Hands on Stomach  - 2 x daily - 7 x weekly - 2 sets - 10 reps - 5 seconds hold - Supine March  - 1 x daily - 7 x weekly - 2 sets - 10 reps - Hooklying Clamshell with Resistance  - 1 x daily - 4-5 x weekly - 2 sets - 10 reps - Supine Active Straight Leg Raise  - 1 x daily - 5-6 x weekly - 2 sets - 10 reps - Clamshell  - 1 x daily - 5-6 x weekly - 2 sets - 10 reps  ASSESSMENT:  CLINICAL IMPRESSION: Pt has only been seen in PT for 3 visits since prior PN due to the holidays and scheduling issues due to clinic availability.  Pt states he has more weakness in his back than pain.  He  states he is making progress.  Pt continues to be limited with standing tolerance which affect the performance of his ADLs/IADLs.  Pt is trying to ambulate more without his cane and continues to have difficulty descending stairs.  Pt is improving with gait though continues to have decreased gait speed and favoring of R LE.  Pt's strength is about the same as last PN except bilat hip abduction strength has improved.  He is motivated and gives good effort with all exercises.  Pt does have knee pain which affects his mobility.  PT educated pt concerning the aquatic process and answered all of pt's questions.  Pt had no significant change in goal progress but has only been seen for 3 visits since last PN.  Pt is progressing toward goals.  He should benefit from cont skilled PT services to address ongoing goals, improve strength, and to improve overall function.   Pt could feel it his knees with standing exercises and took seated rest breaks during standing exercises.  Pt has stooped over posture and requires cuing to stand up tall with standing exercises.     OBJECTIVE IMPAIRMENTS Abnormal gait, decreased activity tolerance, decreased endurance, decreased mobility, difficulty walking, decreased strength, impaired flexibility, and pain.   ACTIVITY LIMITATIONS standing, squatting, stairs, transfers, bed mobility, and locomotion level  PARTICIPATION LIMITATIONS: meal prep, cleaning, and community activity  PERSONAL FACTORS 3+ comorbidities: multiple back surgeries, OA, Bilat knee pain R > L, Peripheral neuropathy, Pseudogout, DM type II, and L knee scope   are also affecting patient's functional outcome.   REHAB POTENTIAL: Good  CLINICAL DECISION MAKING: Evolving/moderate complexity  EVALUATION COMPLEXITY: Moderate   GOALS:   SHORT TERM GOALS: Target date: 06/02/2022  Pt will be independent with HEP for improved pain, strength, and function.  Baseline: Goal status: Goal met  2.  Pt will  tolerate aquatic therapy without adverse effects for improved strength and tolerance to activity.  Baseline:  Goal status: not met Target date:  05/26/2022  3.  Pt will demo improved gait speed and increased step length.  Baseline:  Goal status: goal met  4.  Pt will report at least a 25% improvement in pain and tolerance to daily mobility.  Baseline:  Goal status: Goal met    LONG TERM GOALS: Target date: 09/22/2022   Pt will be able to perform his daily transfers without difficultly.  Baseline:  Goal status: ongoing  2.  Pt will demo at least a 6-8 lb increase in bilat hip flexion and hip abd strength and L knee flexion to 5/5 MMT for improved performance of functional mobility skills including ambulation, stairs, and transfers.  Baseline:  Goal status: progressing  3.  Pt will report improved standing tolerance with ADLs/IADLs including preparing food and drying off after a shower.   Baseline:  Goal status: Ongoing  4.  Pt will report improved tolerance with community ambulation and the ability to ambulate his normal distance without significant difficulty and pain.   Baseline:  Goal status: progressing   PLAN: PT FREQUENCY: 2x/week  PT DURATION: 6 weeks  PLANNED INTERVENTIONS: Therapeutic exercises, Therapeutic activity, Neuromuscular re-education, Balance training, Gait training, Patient/Family education, Self Care, Joint mobilization, Stair training, Aquatic Therapy, Dry Needling, Electrical stimulation, Cryotherapy, Moist heat, Taping, Manual therapy, and Re-evaluation.  PLAN FOR NEXT SESSION:   Cont with land therapy.  Core, LE strengthening, and proprioceptive activities.  Pt may try some aquatic therapy   Selinda Michaels III PT, DPT 08/12/22 12:20 PM

## 2022-09-10 ENCOUNTER — Ambulatory Visit (HOSPITAL_BASED_OUTPATIENT_CLINIC_OR_DEPARTMENT_OTHER): Payer: Medicare Other | Attending: Internal Medicine | Admitting: Physical Therapy

## 2022-09-10 ENCOUNTER — Encounter (HOSPITAL_BASED_OUTPATIENT_CLINIC_OR_DEPARTMENT_OTHER): Payer: Self-pay | Admitting: Physical Therapy

## 2022-09-10 DIAGNOSIS — M5459 Other low back pain: Secondary | ICD-10-CM | POA: Diagnosis not present

## 2022-09-10 DIAGNOSIS — M6281 Muscle weakness (generalized): Secondary | ICD-10-CM | POA: Insufficient documentation

## 2022-09-10 DIAGNOSIS — R262 Difficulty in walking, not elsewhere classified: Secondary | ICD-10-CM | POA: Diagnosis not present

## 2022-09-10 NOTE — Therapy (Signed)
OUTPATIENT PHYSICAL THERAPY  TREATMENT NOTE          Patient Name: Seth Young MRN: [id number] DOB:12/01/1940, 82 y.o., male Today's Date: 09/10/2022   PT End of Session - 09/10/22 1321     Visit Number 12    Number of Visits 20    Date for PT Re-Evaluation 09/22/22    Authorization Type [employer] MCR    PT Start Time 1316    PT Stop Time 1356    PT Time Calculation (min) 40 min    Activity Tolerance Patient tolerated treatment well    Behavior During Therapy WFL for tasks assessed/performed                Past Medical History:  Diagnosis Date   Arthritis    "knees, toes, hands, probably in my back" (03/09/2018)   Ataxia    Basal cell carcinoma    left temporal area-no residual   Colon polyps 10-12-11   past hx.   Coronary artery disease    Exertional angina 06/26/2014   Cath with DES to the OM, Bioflow protocol    GERD (gastroesophageal reflux disease)    H/O hiatal hernia    no problems   High cholesterol    History of echocardiogram    Echo 7/19:  Mild LVH, mild focal basal septal hypertrophy, EF 60-65, no RWMA, Gr 1 DD, trivial AI, MAC   Hypertension    Ischemic chest pain (HCC) 08/07/2014   Occasional tremors    Bilateral hands   Peripheral neuropathy    Pneumonia 1940's X 2; ~ 2017   "infant; walking pneumonia" (03/09/2018)   Pseudogout    "it moves around"   Sleep apnea    no cpap used   Type II diabetes mellitus ([place]) dx'd 1982   nsulin started ~ 2000   Vertigo    Past Surgical History:  Procedure Laterality Date   BACK SURGERY     BASAL CELL CARCINOMA EXCISION Left ~ 2012   face   CARDIAC CATHETERIZATION  02/2005   Dr. Mare Ferrari; negative.   CARDIAC CATHETERIZATION N/A 05/13/2016   Procedure: Left Heart Cath and Coronary Angiography;  Surgeon: Jerline Pain, MD;  Location: [place] [facility];  Service: Cardiovascular;  Laterality: N/A;   CARPAL TUNNEL RELEASE Left 10/2009   CARPAL TUNNEL RELEASE Right ~ 2013   CATARACT EXTRACTION W/  INTRAOCULAR LENS IMPLANT Left 2000's   CATARACT EXTRACTION W/ INTRAOCULAR LENS IMPLANT Right 2015   COLONOSCOPY W/ POLYPECTOMY     CORONARY ANGIOPLASTY WITH STENT PLACEMENT  06/26/2014   "1"   CORONARY ANGIOPLASTY WITH STENT PLACEMENT  06/26/2014   pLAD 50%, d LAD 60%, oD1 80%,  mD1  70%, CFX 50%, OM 2 70%,  RCA 80%, PDA 95% (<36m), OM1 99%-0% with Bio flow stent        CORONARY ANGIOPLASTY WITH STENT PLACEMENT  03/09/2018   CORONARY STENT INTERVENTION N/A 03/09/2018   Procedure: CORONARY STENT INTERVENTION;  Surgeon: MBurnell Blanks MD;  [facility];  Service: Cardiovascular;  Laterality: N/A;   ELBOW SURGERY Bilateral ~ 2014   "for blockages; Dr. GAmedeo Plenty   ENDARTERECTOMY Left 04/23/2015   Procedure: ENDARTERECTOMY CAROTID;  Surgeon: BConrad [place] MD;  Location: [facility]  Service: Vascular;  Laterality: Left;   EYE SURGERY Left    "related go diabetes; laser"   KNEE ARTHROSCOPY Left 05/2011   LEFT HEART CATH AND CORONARY ANGIOGRAPHY N/A 03/09/2018   Procedure: LEFT HEART  CATH AND CORONARY ANGIOGRAPHY;  Surgeon: Burnell Blanks, MD;  Location: [facility];  Service: Cardiovascular;  Laterality: N/A;   LEFT HEART CATHETERIZATION WITH CORONARY ANGIOGRAM N/A 06/26/2014   Procedure: LEFT HEART CATHETERIZATION WITH CORONARY ANGIOGRAM;  Surgeon: Blane Ohara, MD;  [facility];  Service: Cardiovascular;  Laterality: N/A;   LEFT HEART CATHETERIZATION WITH CORONARY ANGIOGRAM N/A 08/07/2014   Procedure: LEFT HEART CATHETERIZATION WITH CORONARY ANGIOGRAM;  Surgeon: Blane Ohara, MD;  Location: [facility];  Service: Cardiovascular;  Laterality: N/A;   LUMBAR LAMINECTOMY/DECOMPRESSION MICRODISCECTOMY Left 11/02/2017   Procedure: MICRODISCECTOMY LUMBAR THREE- LUMBAR FOUR, LEFT;  Surgeon: Ashok Pall, MD;  Location: [place];  Service: Neurosurgery;  Laterality: Left;   NASAL SEPTUM SURGERY  1979   PATCH ANGIOPLASTY Left 04/23/2015   Procedure: PATCH  ANGIOPLASTY USING 1CM X 6CM XENOSURE BIOLOGIC PATCH;  Surgeon: Conrad [place], MD;  Location: [place];  Service: Vascular;  Laterality: Left;   [place]  10/14/2011   Procedure: ROTATOR CUFF REPAIR SHOULDER OPEN;  Surgeon: Johnn Hai, MD;  Location: [facility];  Service: Orthopedics;  Laterality: Right;   SUBACROMIAL DECOMPRESSION  10/14/2011   Procedure: SUBACROMIAL DECOMPRESSION;  Surgeon: Johnn Hai, MD;  Location: [facility];  Service: Orthopedics;  Laterality: Right;   TRIGGER FINGER RELEASE Right ~ 2013-2014   "3rd & 4th digits"   Patient Active Problem List   Diagnosis Date Noted   Coronary artery disease involving native coronary artery of native heart with angina pectoris ([place]) 03/07/2018   HNP (herniated nucleus pulposus), lumbar 11/02/2017   Carotid artery disease without cerebral infarction ([place]) 04/09/2015   Chest pain of uncertain etiology 75/88/3254   Diabetic neuropathy ([place]) 06/21/2014   Essential hypertension 06/21/2014   Hypercholesterolemia 06/21/2014   Rotator cuff tear 10/15/2011   IDDM (insulin dependent diabetes mellitus) 10/16/2008    PCP: Burnard Bunting, MD  REFERRING PROVIDER: Burnard Bunting, MD   REFERRING DIAG: M54.40 (ICD-10-CM) - Lumbago with sciatica, unspecified side   Rationale for Evaluation and Treatment Rehabilitation  THERAPY DIAG:  Muscle weakness (generalized)  Other low back pain  Difficulty in walking, not elsewhere classified  ONSET DATE: PT order 04/21/2022 / Surgery March 2022  SUBJECTIVE:                                                                                                                                                                                           SUBJECTIVE STATEMENT: Pt states one day he was going to get on the floor to perform his exercises and  had increased L lumbar pain which traveled to his side and groin.  Pt reports this occurred approx 2 weeks  ago.  He now feels it in his L lateral abdomen/side and L sided lumbar.  Pt states the pain is not getting better.  Pt states his pain is 2-3/10 currently though increases with specific movements.   Pt states overall his walking is better.  Pt feels stronger in the back of his legs.  Pt reports his posture is better.  His back feels week with standing activities including drying off after shower.  Pt has to lean against the sink to dry off.      PERTINENT HISTORY:  -3 back surgeries:  L3-5 laminotomy and pt thinks he has some screws in lumbar March 2022, Lumbar laminectomy/decompression/microdisctomy in 2019, Lumbar surgery in 1964  -OA, Bilat knee pain R > L, CAD, Peripheral neuropathy, Vertigo, Pseudogout, DM type II and diabetic retinopathy,   -Pt has Insulin monitor and tremors in bilat Ue's R > L  -PSHx:  R shoulder surgery in 2013 and was unable to repair rotator cuff, Coronary angioplasty with stent placement in 2015 and 2019, and L knee arthroscopy in 2012    PAIN:  NPRS:  No pain, just some discomfort Location:  lumbar    PRECAUTIONS: Other: multiple back surgeries, peripheral neuropathy, R shoulder RCR  WEIGHT BEARING RESTRICTIONS No  FALLS:  Has patient fallen in last 6 months? No  LIVING ENVIRONMENT: Lives with: lives with their spouse Lives in: 1 story home Stairs: 3-4 steps with rail Has following equipment at home: Single point cane, Environmental consultant - 2 wheeled, and Crutches  OCCUPATION: Pt works from home and sets his own hours.  PLOF: Independent; Pt began using SPC in early 2019.  Pt was able to perform daily mobility and daily activities with greater ease and less difficulty  PATIENT GOALS improve mobility, get rid of cane, improve strength legs and back.  Improve lumbar stiffness.  Improve performance of stairs   OBJECTIVE:   DIAGNOSTIC FINDINGS:  FINDINGS: Segmentation:  Standard.   Alignment:  Trace anterolisthesis L3 on L4.   Vertebrae: No fracture,  evidence of discitis, or bone lesion. There is some degenerative endplate signal change at L3-4.   Conus medullaris and cauda equina: Conus extends to the T12 level. Conus and cauda equina appear normal.   Paraspinal and other soft tissues: Negative.   Disc levels:   T11-12 and T12-L1 are imaged in the sagittal plane only and negative.   L1-2: Minimal disc bulge without stenosis.   L2-3: Shallow disc bulge and mild ligamentum flavum thickening. Mild central canal and left foraminal narrowing appear unchanged. The right foramen is open.   L3-4: Status post left laminotomy. There has been progression of disease at this level. A new and very large central left paracentral disc protrusion extends into the left foramen. There is severe compression of the thecal sac and narrowing in the lateral recesses, worse on the left. Moderate bilateral foraminal narrowing is worse on the left.   L4-5: Status post left laminotomy as seen on the prior exam. There is a shallow broad-based disc bulge. The narrowing in the subarticular recesses is present which could impact either descending L5 root. Mild bilateral foraminal narrowing. No change.   L5-S1: A right subarticular recess protrusion impinging on the right S1 root appears unchanged. Foramina are open.   IMPRESSION: Since the prior MRI, the patient has developed a large central and left paracentral disc protrusion at  L3-4 causing severe compression of the thecal sac and narrowing in the lateral recesses, worse on the left. Moderate bilateral foraminal narrowing at this level is worse on the left. Prior left laminotomy noted.   No change in a broad-based disc bulge at L4-5 causing narrowing in the subarticular recesses which could impact either descending L5 root. Left laminotomy defect is seen at this level.   No change in a right subarticular recess protrusion at L5-S1. The disc appears to impinge on the right S1  root.     TODAY'S TREATMENT   Therapeutic Exercise: Reviewed current function, pain level, and HEP compliance.Marland Kitchen  PALPATION:   Pt had one area of tenderness in lower L side of abdomen and is more pronounced on L side.  Pt had no tenderness in L sided ribs, R side of abdomen, Lumbar SP's, and and L sided lumbar paraspinals.    Pt performed: Supine clams with TrA with RTB 2x10 Sidestepping with RTB around thighs with UE support on rail x 2 laps     Standing heel raises with TrA 2x10    Standing rows with RTB with TrA 2x10    Neuro Re-ed Activities:  (for improved core strength, lumbopelvic stabilization, and balance with daily mobility) Standing on airex without UE support with 2x30 sec with NBOS with SBA/CGA.     Marching on airex 2x10    Supine SLR with contralateral UE extension with TrA 2x10         PATIENT EDUCATION:  Education details:  PT instructed pt to see MD concerning his abdomen and lumbar pain.  HEP, POC, rationale of exercises, exercise form, relevant anatomy.  PT answered pt's questions. Person educated: Patient Education method: Explanation, Demonstration, Tactile cues, Verbal cues, and Handouts Education comprehension: verbalized understanding, returned demonstration, verbal cues required, tactile cues required, and needs further education   HOME EXERCISE PROGRAM: Access Code: [email or web address] URL: [email or web address] Date: 05/12/2022 Prepared by: Ronny Flurry  Exercises - Supine Transversus Abdominis Bracing - Hands on Stomach  - 2 x daily - 7 x weekly - 2 sets - 10 reps - 5 seconds hold - Supine March  - 1 x daily - 7 x weekly - 2 sets - 10 reps - Hooklying Clamshell with Resistance  - 1 x daily - 4-5 x weekly - 2 sets - 10 reps - Supine Active Straight Leg Raise  - 1 x daily - 5-6 x weekly - 2 sets - 10 reps - Clamshell  - 1 x daily - 5-6 x weekly - 2 sets - 10 reps  ASSESSMENT:  CLINICAL IMPRESSION: Pt was last seen on 08/18/2022 and  hasn't been to PT since due to the clinic's schedule availability.  Pt states he is doing better overall though did have an onset of increased pain when he was transferring to the floor to perform his home exercises.  He is not having the groin pain anymore but continues to have abdominal and lumbar pain.  He states the pain is not improving.  Pt had tenderness in L lower abdomen and is minimally more pronounced on L sided.  PT instructed to see MD about it.  Pt continues to be limited with standing tolerance though states he is walking better and has improved strength.  Pt tolerated exercises well today and performed exercises well without c/o's.  He responded well to Rx having no change in pain after Rx.  He should benefit from cont skilled PT services to address ongoing goals, improve strength, and  to improve overall function.       OBJECTIVE IMPAIRMENTS Abnormal gait, decreased activity tolerance, decreased endurance, decreased mobility, difficulty walking, decreased strength, impaired flexibility, and pain.   ACTIVITY LIMITATIONS standing, squatting, stairs, transfers, bed mobility, and locomotion level  PARTICIPATION LIMITATIONS: meal prep, cleaning, and community activity  PERSONAL FACTORS 3+ comorbidities: multiple back surgeries, OA, Bilat knee pain R > L, Peripheral neuropathy, Pseudogout, DM type II, and L knee scope   are also affecting patient's functional outcome.   REHAB POTENTIAL: Good  CLINICAL DECISION MAKING: Evolving/moderate complexity  EVALUATION COMPLEXITY: Moderate   GOALS:   SHORT TERM GOALS: Target date: 06/02/2022  Pt will be independent with HEP for improved pain, strength, and function.  Baseline: Goal status: Goal met  2.  Pt will tolerate aquatic therapy without adverse effects for improved strength and tolerance to activity.  Baseline:  Goal status: not met Target date: 05/26/2022  3.  Pt will demo improved gait speed and increased step length.   Baseline:  Goal status: goal met  4.  Pt will report at least a 25% improvement in pain and tolerance to daily mobility.  Baseline:  Goal status: Goal met    LONG TERM GOALS: Target date: 09/22/2022   Pt will be able to perform his daily transfers without difficultly.  Baseline:  Goal status: ongoing  2.  Pt will demo at least a 6-8 lb increase in bilat hip flexion and hip abd strength and L knee flexion to 5/5 MMT for improved performance of functional mobility skills including ambulation, stairs, and transfers.  Baseline:  Goal status: progressing  3.  Pt will report improved standing tolerance with ADLs/IADLs including preparing food and drying off after a shower.   Baseline:  Goal status: Ongoing  4.  Pt will report improved tolerance with community ambulation and the ability to ambulate his normal distance without significant difficulty and pain.   Baseline:  Goal status: progressing   PLAN: PT FREQUENCY: 2x/week  PT DURATION: 6 weeks  PLANNED INTERVENTIONS: Therapeutic exercises, Therapeutic activity, Neuromuscular re-education, Balance training, Gait training, Patient/Family education, Self Care, Joint mobilization, Stair training, Aquatic Therapy, Dry Needling, Electrical stimulation, Cryotherapy, Moist heat, Taping, Manual therapy, and Re-evaluation.  PLAN FOR NEXT SESSION:   Cont with land therapy.  Core, LE strengthening, and proprioceptive activities.     Selinda Michaels III PT, DPT 09/10/22 2:11 PM

## 2022-09-13 ENCOUNTER — Other Ambulatory Visit (HOSPITAL_BASED_OUTPATIENT_CLINIC_OR_DEPARTMENT_OTHER): Payer: Self-pay | Admitting: Family Medicine

## 2022-09-13 ENCOUNTER — Ambulatory Visit (HOSPITAL_BASED_OUTPATIENT_CLINIC_OR_DEPARTMENT_OTHER)
Admission: RE | Admit: 2022-09-13 | Discharge: 2022-09-13 | Disposition: A | Discharge status: Home / Self Care | Payer: Medicare Other | Source: Ambulatory Visit | Attending: Family Medicine | Admitting: Family Medicine

## 2022-09-13 DIAGNOSIS — R1032 Left lower quadrant pain: Secondary | ICD-10-CM | POA: Diagnosis not present

## 2022-09-13 DIAGNOSIS — K5900 Constipation, unspecified: Secondary | ICD-10-CM | POA: Diagnosis not present

## 2022-09-13 DIAGNOSIS — I700 Atherosclerosis of aorta: Secondary | ICD-10-CM | POA: Diagnosis not present

## 2022-09-13 DIAGNOSIS — R109 Unspecified abdominal pain: Secondary | ICD-10-CM | POA: Insufficient documentation

## 2022-09-13 DIAGNOSIS — E1129 Type 2 diabetes mellitus with other diabetic kidney complication: Secondary | ICD-10-CM | POA: Diagnosis not present

## 2022-09-14 DIAGNOSIS — E1129 Type 2 diabetes mellitus with other diabetic kidney complication: Secondary | ICD-10-CM | POA: Diagnosis not present

## 2022-09-15 ENCOUNTER — Ambulatory Visit (HOSPITAL_BASED_OUTPATIENT_CLINIC_OR_DEPARTMENT_OTHER): Payer: Medicare Other | Admitting: Physical Therapy

## 2022-09-22 ENCOUNTER — Encounter (HOSPITAL_BASED_OUTPATIENT_CLINIC_OR_DEPARTMENT_OTHER): Payer: Self-pay | Admitting: Physical Therapy

## 2022-09-22 ENCOUNTER — Ambulatory Visit (HOSPITAL_BASED_OUTPATIENT_CLINIC_OR_DEPARTMENT_OTHER): Payer: Medicare Other | Admitting: Physical Therapy

## 2022-09-22 DIAGNOSIS — M5459 Other low back pain: Secondary | ICD-10-CM | POA: Diagnosis not present

## 2022-09-22 DIAGNOSIS — R262 Difficulty in walking, not elsewhere classified: Secondary | ICD-10-CM | POA: Diagnosis not present

## 2022-09-22 DIAGNOSIS — M6281 Muscle weakness (generalized): Secondary | ICD-10-CM | POA: Diagnosis not present

## 2022-09-22 NOTE — Therapy (Signed)
OUTPATIENT PHYSICAL THERAPY  TREATMENT NOTE          Patient Name: Seth Young MRN: [id number] DOB:March 01, 1941, 82 y.o., male Today's Date: 09/22/2022   PT End of Session - 09/22/22 1431     Visit Number 13    Number of Visits 20    Date for PT Re-Evaluation 09/22/22    Authorization Type [employer] MCR    PT Start Time 1354    PT Stop Time 1430    PT Time Calculation (min) 36 min    Activity Tolerance Patient tolerated treatment well    Behavior During Therapy WFL for tasks assessed/performed                 Past Medical History:  Diagnosis Date   Arthritis    "knees, toes, hands, probably in my back" (03/09/2018)   Ataxia    Basal cell carcinoma    left temporal area-no residual   Colon polyps 10-12-11   past hx.   Coronary artery disease    Exertional angina 06/26/2014   Cath with DES to the OM, Bioflow protocol    GERD (gastroesophageal reflux disease)    H/O hiatal hernia    no problems   High cholesterol    History of echocardiogram    Echo 7/19:  Mild LVH, mild focal basal septal hypertrophy, EF 60-65, no RWMA, Gr 1 DD, trivial AI, MAC   Hypertension    Ischemic chest pain (HCC) 08/07/2014   Occasional tremors    Bilateral hands   Peripheral neuropathy    Pneumonia 1940's X 2; ~ 2017   "infant; walking pneumonia" (03/09/2018)   Pseudogout    "it moves around"   Sleep apnea    no cpap used   Type II diabetes mellitus ([place]) dx'd 1982   nsulin started ~ 2000   Vertigo    Past Surgical History:  Procedure Laterality Date   BACK SURGERY     BASAL CELL CARCINOMA EXCISION Left ~ 2012   face   CARDIAC CATHETERIZATION  02/2005   Dr. Mare Ferrari; negative.   CARDIAC CATHETERIZATION N/A 05/13/2016   Procedure: Left Heart Cath and Coronary Angiography;  Surgeon: Jerline Pain, MD;  Location: [facility];  Service: Cardiovascular;  Laterality: N/A;   CARPAL TUNNEL RELEASE Left 10/2009   CARPAL TUNNEL RELEASE Right ~ 2013   CATARACT EXTRACTION W/  INTRAOCULAR LENS IMPLANT Left 2000's   CATARACT EXTRACTION W/ INTRAOCULAR LENS IMPLANT Right 2015   COLONOSCOPY W/ POLYPECTOMY     CORONARY ANGIOPLASTY WITH STENT PLACEMENT  06/26/2014   "1"   CORONARY ANGIOPLASTY WITH STENT PLACEMENT  06/26/2014   pLAD 50%, d LAD 60%, oD1 80%,  mD1  70%, CFX 50%, OM 2 70%,  RCA 80%, PDA 95% (<39m), OM1 99%-0% with Bio flow stent        CORONARY ANGIOPLASTY WITH STENT PLACEMENT  03/09/2018   CORONARY STENT INTERVENTION N/A 03/09/2018   Procedure: CORONARY STENT INTERVENTION;  Surgeon: MBurnell Blanks MD;  [facility];  Service: Cardiovascular;  Laterality: N/A;   ELBOW SURGERY Bilateral ~ 2014   "for blockages; Dr. GAmedeo Plenty   ENDARTERECTOMY Left 04/23/2015   Procedure: ENDARTERECTOMY CAROTID;  Surgeon: BConrad Rosa Sanchez MD;  [place] [facility]  Service: Vascular;  Laterality: Left;   EYE SURGERY Left    "related go diabetes; laser"   KNEE ARTHROSCOPY Left 05/2011   LEFT HEART CATH AND CORONARY ANGIOGRAPHY N/A 03/09/2018   Procedure: LEFT  HEART CATH AND CORONARY ANGIOGRAPHY;  Surgeon: Burnell Blanks, MD;  Location: [facility];  Service: Cardiovascular;  Laterality: N/A;   LEFT HEART CATHETERIZATION WITH CORONARY ANGIOGRAM N/A 06/26/2014   Procedure: LEFT HEART CATHETERIZATION WITH CORONARY ANGIOGRAM;  Surgeon: Blane Ohara, MD;  Location: [facility];  Service: Cardiovascular;  Laterality: N/A;   LEFT HEART CATHETERIZATION WITH CORONARY ANGIOGRAM N/A 08/07/2014   Procedure: LEFT HEART CATHETERIZATION WITH CORONARY ANGIOGRAM;  Surgeon: Blane Ohara, MD;  Location: [facility];  Service: Cardiovascular;  Laterality: N/A;   LUMBAR LAMINECTOMY/DECOMPRESSION MICRODISCECTOMY Left 11/02/2017   Procedure: MICRODISCECTOMY LUMBAR THREE- LUMBAR FOUR, LEFT;  Surgeon: Ashok Pall, MD;  Location: [place];  Service: Neurosurgery;  Laterality: Left;   NASAL SEPTUM SURGERY  1979   PATCH ANGIOPLASTY Left 04/23/2015   Procedure: PATCH  ANGIOPLASTY USING 1CM X 6CM XENOSURE BIOLOGIC PATCH;  Surgeon: Conrad Batavia, MD;  Location: [place];  Service: Vascular;  Laterality: Left;   [place]  10/14/2011   Procedure: ROTATOR CUFF REPAIR SHOULDER OPEN;  Surgeon: Johnn Hai, MD;  Location: [facility];  Service: Orthopedics;  Laterality: Right;   SUBACROMIAL DECOMPRESSION  10/14/2011   Procedure: SUBACROMIAL DECOMPRESSION;  Surgeon: Johnn Hai, MD;  Location: [facility];  Service: Orthopedics;  Laterality: Right;   TRIGGER FINGER RELEASE Right ~ 2013-2014   "3rd & 4th digits"   Patient Active Problem List   Diagnosis Date Noted   Coronary artery disease involving native coronary artery of native heart with angina pectoris ([place]) 03/07/2018   HNP (herniated nucleus pulposus), lumbar 11/02/2017   Carotid artery disease without cerebral infarction ([place]) 04/09/2015   Chest pain of uncertain etiology [id number]   Diabetic neuropathy ([place]) 06/21/2014   Essential hypertension 06/21/2014   Hypercholesterolemia 06/21/2014   Rotator cuff tear 10/15/2011   IDDM (insulin dependent diabetes mellitus) 10/16/2008    PCP: Burnard Bunting, MD  REFERRING PROVIDER: Burnard Bunting, MD   REFERRING DIAG: M54.40 (ICD-10-CM) - Lumbago with sciatica, unspecified side   Rationale for Evaluation and Treatment Rehabilitation  THERAPY DIAG:  Muscle weakness (generalized)  Other low back pain  Difficulty in walking, not elsewhere classified  ONSET DATE: PT order 04/21/2022 / Surgery March 2022  SUBJECTIVE:                                                                                                                                                                                           SUBJECTIVE STATEMENT: Pt went to MD due to L sided/abdomen pain.  Pt had a CT scan which  didn't show any problems.  Pt states they thought his bowels were backed up.  Pt was instructed to take stool softeners  and he is feeling better much now.   Pt denies pain currently and denies any adverse effects after prior Rx.  Pt didn't perform as much of his HEP due to his pain though is performing more now.      PERTINENT HISTORY:  -3 back surgeries:  L3-5 laminotomy and pt thinks he has some screws in lumbar March 2022, Lumbar laminectomy/decompression/microdisctomy in 2019, Lumbar surgery in 1964  -OA, Bilat knee pain R > L, CAD, Peripheral neuropathy, Vertigo, Pseudogout, DM type II and diabetic retinopathy,   -Pt has Insulin monitor and tremors in bilat Ue's R > L  -PSHx:  R shoulder surgery in 2013 and was unable to repair rotator cuff, Coronary angioplasty with stent placement in 2015 and 2019, and L knee arthroscopy in 2012    PAIN:  NPRS:  No pain though states his R knee has been bothering him     PRECAUTIONS: Other: multiple back surgeries, peripheral neuropathy, R shoulder RCR  WEIGHT BEARING RESTRICTIONS No  FALLS:  Has patient fallen in last 6 months? No  LIVING ENVIRONMENT: Lives with: lives with their spouse Lives in: 1 story home Stairs: 3-4 steps with rail Has following equipment at home: Single point cane, Environmental consultant - 2 wheeled, and Crutches  OCCUPATION: Pt works from home and sets his own hours.  PLOF: Independent; Pt began using SPC in early 2019.  Pt was able to perform daily mobility and daily activities with greater ease and less difficulty  PATIENT GOALS improve mobility, get rid of cane, improve strength legs and back.  Improve lumbar stiffness.  Improve performance of stairs   OBJECTIVE:   DIAGNOSTIC FINDINGS:  FINDINGS: Segmentation:  Standard.   Alignment:  Trace anterolisthesis L3 on L4.   Vertebrae: No fracture, evidence of discitis, or bone lesion. There is some degenerative endplate signal change at L3-4.   Conus medullaris and cauda equina: Conus extends to the T12 level. Conus and cauda equina appear normal.   Paraspinal and other soft tissues:  Negative.   Disc levels:   T11-12 and T12-L1 are imaged in the sagittal plane only and negative.   L1-2: Minimal disc bulge without stenosis.   L2-3: Shallow disc bulge and mild ligamentum flavum thickening. Mild central canal and left foraminal narrowing appear unchanged. The right foramen is open.   L3-4: Status post left laminotomy. There has been progression of disease at this level. A new and very large central left paracentral disc protrusion extends into the left foramen. There is severe compression of the thecal sac and narrowing in the lateral recesses, worse on the left. Moderate bilateral foraminal narrowing is worse on the left.   L4-5: Status post left laminotomy as seen on the prior exam. There is a shallow broad-based disc bulge. The narrowing in the subarticular recesses is present which could impact either descending L5 root. Mild bilateral foraminal narrowing. No change.   L5-S1: A right subarticular recess protrusion impinging on the right S1 root appears unchanged. Foramina are open.   IMPRESSION: Since the prior MRI, the patient has developed a large central and left paracentral disc protrusion at L3-4 causing severe compression of the thecal sac and narrowing in the lateral recesses, worse on the left. Moderate bilateral foraminal narrowing at this level is worse on the left. Prior left laminotomy noted.   No change in a broad-based disc  bulge at L4-5 causing narrowing in the subarticular recesses which could impact either descending L5 root. Left laminotomy defect is seen at this level.   No change in a right subarticular recess protrusion at L5-S1. The disc appears to impinge on the right S1 root.     TODAY'S TREATMENT   Therapeutic Exercise: Reviewed pt presentation, pain level, and HEP compliance.   Pt performed: Nustep with bilat UE/LE's L4 x 6 mins  Supine clams with TrA with GTB 3x10 Sidestepping with RTB around thighs with UE support  on rail x 1 lap     Standing heel raises with TrA 2x10    Standing rows with RTB with TrA 2x10   Standing shoulder extension with RTB with TrA 2x10   Step ups on 4 inch step with TrA x10 reps each LE    Neuro Re-ed Activities:  (for improved core strength, lumbopelvic stabilization, and balance with daily mobility)    Marching on airex 3x10 Supine alt UE/LE with TrA 2x10 Supine SLR with contralateral UE extension x10 bilat           PATIENT EDUCATION:  Education details:  PT instructed pt to see MD concerning his abdomen and lumbar pain.  HEP, POC, rationale of exercises, exercise form, relevant anatomy.  PT answered pt's questions. Person educated: Patient Education method: Explanation, Demonstration, Tactile cues, Verbal cues, and Handouts Education comprehension: verbalized understanding, returned demonstration, verbal cues required, tactile cues required, and needs further education   HOME EXERCISE PROGRAM: Access Code: [email or web address] URL: [email or web address] Date: 05/12/2022 Prepared by: Ronny Flurry  Exercises - Supine Transversus Abdominis Bracing - Hands on Stomach  - 2 x daily - 7 x weekly - 2 sets - 10 reps - 5 seconds hold - Supine March  - 1 x daily - 7 x weekly - 2 sets - 10 reps - Hooklying Clamshell with Resistance  - 1 x daily - 4-5 x weekly - 2 sets - 10 reps - Supine Active Straight Leg Raise  - 1 x daily - 5-6 x weekly - 2 sets - 10 reps - Clamshell  - 1 x daily - 5-6 x weekly - 2 sets - 10 reps  ASSESSMENT:  CLINICAL IMPRESSION: Pt returns to PT after seeing MD concerning his groin, abdominal, and side pain and is doing much better now.  Pt has started back performing HEP due to pain being resolved.  Pt performed exercises well without c/o's except lateral band walks.  PT stopped resisted sidestepping due to c/o's of R knee pain.  He responded well to Rx having no pain after Rx.      OBJECTIVE IMPAIRMENTS Abnormal gait, decreased activity  tolerance, decreased endurance, decreased mobility, difficulty walking, decreased strength, impaired flexibility, and pain.   ACTIVITY LIMITATIONS standing, squatting, stairs, transfers, bed mobility, and locomotion level  PARTICIPATION LIMITATIONS: meal prep, cleaning, and community activity  PERSONAL FACTORS 3+ comorbidities: multiple back surgeries, OA, Bilat knee pain R > L, Peripheral neuropathy, Pseudogout, DM type II, and L knee scope   are also affecting patient's functional outcome.   REHAB POTENTIAL: Good  CLINICAL DECISION MAKING: Evolving/moderate complexity  EVALUATION COMPLEXITY: Moderate   GOALS:   SHORT TERM GOALS: Target date: 06/02/2022  Pt will be independent with HEP for improved pain, strength, and function.  Baseline: Goal status: Goal met  2.  Pt will tolerate aquatic therapy without adverse effects for improved strength and tolerance to activity.  Baseline:  Goal status: not met Target date: 05/26/2022  3.  Pt will demo improved gait speed and increased step length.  Baseline:  Goal status: goal met  4.  Pt will report at least a 25% improvement in pain and tolerance to daily mobility.  Baseline:  Goal status: Goal met    LONG TERM GOALS: Target date: 09/22/2022   Pt will be able to perform his daily transfers without difficultly.  Baseline:  Goal status: ongoing  2.  Pt will demo at least a 6-8 lb increase in bilat hip flexion and hip abd strength and L knee flexion to 5/5 MMT for improved performance of functional mobility skills including ambulation, stairs, and transfers.  Baseline:  Goal status: progressing  3.  Pt will report improved standing tolerance with ADLs/IADLs including preparing food and drying off after a shower.   Baseline:  Goal status: Ongoing  4.  Pt will report improved tolerance with community ambulation and the ability to ambulate his normal distance without significant difficulty and pain.   Baseline:  Goal  status: progressing   PLAN: PT FREQUENCY: 2x/week  PT DURATION: 6 weeks  PLANNED INTERVENTIONS: Therapeutic exercises, Therapeutic activity, Neuromuscular re-education, Balance training, Gait training, Patient/Family education, Self Care, Joint mobilization, Stair training, Aquatic Therapy, Dry Needling, Electrical stimulation, Cryotherapy, Moist heat, Taping, Manual therapy, and Re-evaluation.  PLAN FOR NEXT SESSION:   Cont with land therapy.  Core, LE strengthening, and proprioceptive activities.  PT to perform PN/recert next visit.     Selinda Michaels III PT, DPT 09/22/22 5:22 PM

## 2022-09-29 ENCOUNTER — Ambulatory Visit (HOSPITAL_BASED_OUTPATIENT_CLINIC_OR_DEPARTMENT_OTHER): Payer: Medicare Other | Admitting: Physical Therapy

## 2022-09-29 DIAGNOSIS — M5459 Other low back pain: Secondary | ICD-10-CM | POA: Diagnosis not present

## 2022-09-29 DIAGNOSIS — M6281 Muscle weakness (generalized): Secondary | ICD-10-CM | POA: Diagnosis not present

## 2022-09-29 DIAGNOSIS — R262 Difficulty in walking, not elsewhere classified: Secondary | ICD-10-CM | POA: Diagnosis not present

## 2022-09-29 NOTE — Therapy (Signed)
OUTPATIENT PHYSICAL THERAPY  TREATMENT NOTE  / PROGRESS NOTE Progress Note Reporting Period 08/18/2022 to 09/29/22  See note below for Objective Data and Assessment of Progress/Goals.            Patient Name: Seth Young MRN: [id number] DOB:[birth year], 82 y.o., male Today's Date: 09/30/2022   PT End of Session - 09/30/22 1410     Visit Number 14    Number of Visits 20    Date for PT Re-Evaluation 11/10/22    Authorization Type [employer] MCR    PT Start Time 1303    PT Stop Time 1338    PT Time Calculation (min) 35 min    Activity Tolerance Patient tolerated treatment well    Behavior During Therapy WFL for tasks assessed/performed                  Past Medical History:  Diagnosis Date   Arthritis    "knees, toes, hands, probably in my back" (03/09/2018)   Ataxia    Basal cell carcinoma    left temporal area-no residual   Colon polyps 10-12-11   past hx.   Coronary artery disease    Exertional angina 06/26/2014   Cath with DES to the OM, Bioflow protocol    GERD (gastroesophageal reflux disease)    H/O hiatal hernia    no problems   High cholesterol    History of echocardiogram    Echo 7/19:  Mild LVH, mild focal basal septal hypertrophy, EF 60-65, no RWMA, Gr 1 DD, trivial AI, MAC   Hypertension    Ischemic chest pain (HCC) 08/07/2014   Occasional tremors    Bilateral hands   Peripheral neuropathy    Pneumonia 1940's X 2; ~ 2017   "infant; walking pneumonia" (03/09/2018)   Pseudogout    "it moves around"   Sleep apnea    no cpap used   Type II diabetes mellitus ([place]) dx'd 1982   nsulin started ~ 2000   Vertigo    Past Surgical History:  Procedure Laterality Date   BACK SURGERY     BASAL CELL CARCINOMA EXCISION Left ~ 2012   face   CARDIAC CATHETERIZATION  02/2005   Dr. Mare Ferrari; negative.   CARDIAC CATHETERIZATION N/A 05/13/2016   Procedure: Left Heart Cath and Coronary Angiography;  Surgeon: Jerline Pain, MD;  Location: [facility];   Service: Cardiovascular;  Laterality: N/A;   CARPAL TUNNEL RELEASE Left 10/2009   CARPAL TUNNEL RELEASE Right ~ 2013   CATARACT EXTRACTION W/ INTRAOCULAR LENS IMPLANT Left 2000's   CATARACT EXTRACTION W/ INTRAOCULAR LENS IMPLANT Right 2015   COLONOSCOPY W/ POLYPECTOMY     CORONARY ANGIOPLASTY WITH STENT PLACEMENT  06/26/2014   "1"   CORONARY ANGIOPLASTY WITH STENT PLACEMENT  06/26/2014   pLAD 50%, d LAD 60%, oD1 80%,  mD1  70%, CFX 50%, OM 2 70%,  RCA 80%, PDA 95% (<27m), OM1 99%-0% with Bio flow stent        CORONARY ANGIOPLASTY WITH STENT PLACEMENT  03/09/2018   CORONARY STENT INTERVENTION N/A 03/09/2018   Procedure: CORONARY STENT INTERVENTION;  Surgeon: MBurnell Blanks MD;  [facility];  Service: Cardiovascular;  Laterality: N/A;   ELBOW SURGERY Bilateral ~ 2014   "for blockages; Dr. GAmedeo Plenty   ENDARTERECTOMY Left 04/23/2015   Procedure: ENDARTERECTOMY CAROTID;  Surgeon: BConrad Salem MD;  [place] [facility]  Service: Vascular;  Laterality: Left;   EYE SURGERY Left    "  related go diabetes; laser"   KNEE ARTHROSCOPY Left 05/2011   LEFT HEART CATH AND CORONARY ANGIOGRAPHY N/A 03/09/2018   Procedure: LEFT HEART CATH AND CORONARY ANGIOGRAPHY;  Surgeon: Burnell Blanks, MD;  Location: [facility];  Service: Cardiovascular;  Laterality: N/A;   LEFT HEART CATHETERIZATION WITH CORONARY ANGIOGRAM N/A 06/26/2014   Procedure: LEFT HEART CATHETERIZATION WITH CORONARY ANGIOGRAM;  Surgeon: Blane Ohara, MD;  Location: [facility];  Service: Cardiovascular;  Laterality: N/A;   LEFT HEART CATHETERIZATION WITH CORONARY ANGIOGRAM N/A 08/07/2014   Procedure: LEFT HEART CATHETERIZATION WITH CORONARY ANGIOGRAM;  Surgeon: Blane Ohara, MD;  Location: [facility] CATH LAB;  Service: Cardiovascular;  Laterality: N/A;   LUMBAR LAMINECTOMY/DECOMPRESSION MICRODISCECTOMY Left 11/02/2017   Procedure: MICRODISCECTOMY LUMBAR THREE- LUMBAR FOUR, LEFT;  Surgeon: Ashok Pall, MD;   Location: [facility];  Service: Neurosurgery;  Laterality: Left;   NASAL SEPTUM SURGERY  1979   PATCH ANGIOPLASTY Left 04/23/2015   Procedure: PATCH ANGIOPLASTY USING 1CM X 6CM XENOSURE BIOLOGIC PATCH;  Surgeon: Conrad Van Wert, MD;  Location: [place];  Service: Vascular;  Laterality: Left;   [place]  10/14/2011   Procedure: ROTATOR CUFF REPAIR SHOULDER OPEN;  Surgeon: Johnn Hai, MD;  Location: [facility];  Service: Orthopedics;  Laterality: Right;   SUBACROMIAL DECOMPRESSION  10/14/2011   Procedure: SUBACROMIAL DECOMPRESSION;  Surgeon: Johnn Hai, MD;  Location: [facility];  Service: Orthopedics;  Laterality: Right;   TRIGGER FINGER RELEASE Right ~ 2013-2014   "3rd & 4th digits"   Patient Active Problem List   Diagnosis Date Noted   Coronary artery disease involving native coronary artery of native heart with angina pectoris ([place]) 03/07/2018   HNP (herniated nucleus pulposus), lumbar 11/02/2017   Carotid artery disease without cerebral infarction ([place]) 04/09/2015   Chest pain of uncertain etiology 99991111   Diabetic neuropathy ([place]) 06/21/2014   Essential hypertension 06/21/2014   Hypercholesterolemia 06/21/2014   Rotator cuff tear 10/15/2011   IDDM (insulin dependent diabetes mellitus) 10/16/2008    PCP: Burnard Bunting, MD  REFERRING PROVIDER: Burnard Bunting, MD   REFERRING DIAG: M54.40 (ICD-10-CM) - Lumbago with sciatica, unspecified side   Rationale for Evaluation and Treatment Rehabilitation  THERAPY DIAG:  Muscle weakness (generalized)  Other low back pain  Difficulty in walking, not elsewhere classified  ONSET DATE: PT order 04/21/2022 / Surgery March 2022  SUBJECTIVE:  SUBJECTIVE STATEMENT: Pt reports compliance with HEP and states  the home exercises help him.  Pt reports improved posture.  Pt reports improved pain and stiffness overall though continues to have pain and stiffness.  "I think therapy is helping.  I still need more therapy."  Pt reports some days he has difficulty with daily transfers and some days not.  Pt states he has seen some improvement with standing tolerance with ADLs/IADLs, but some days are better than others.  He still has to lean on the counter with drying off.        PERTINENT HISTORY:  -3 back surgeries:  L3-5 laminotomy and pt thinks he has some screws in lumbar March 2022, Lumbar laminectomy/decompression/microdisctomy in 2019, Lumbar surgery in 1964  -OA, Bilat knee pain R > L, CAD, Peripheral neuropathy, Vertigo, Pseudogout, DM type II and diabetic retinopathy,   -Pt has Insulin monitor and tremors in bilat Ue's R > L  -PSHx:  R shoulder surgery in 2013 and was unable to repair rotator cuff, Coronary angioplasty with stent placement in 2015 and 2019, and L knee arthroscopy in 2012    PAIN:  NPRS:  4/10 pain and stiffness ; 7/10 worst, 0/10 best     PRECAUTIONS: Other: multiple back surgeries, peripheral neuropathy, R shoulder RCR  WEIGHT BEARING RESTRICTIONS No  FALLS:  Has patient fallen in last 6 months? No  LIVING ENVIRONMENT: Lives with: lives with their spouse Lives in: 1 story home Stairs: 3-4 steps with rail Has following equipment at home: Single point cane, Environmental consultant - 2 wheeled, and Crutches  OCCUPATION: Pt works from home and sets his own hours.  PLOF: Independent; Pt began using SPC in early 2019.  Pt was able to perform daily mobility and daily activities with greater ease and less difficulty  PATIENT GOALS improve mobility, get rid of cane, improve strength legs and back.  Improve lumbar stiffness.  Improve performance of stairs   OBJECTIVE:   DIAGNOSTIC FINDINGS:  FINDINGS: Segmentation:  Standard.   Alignment:  Trace anterolisthesis L3 on L4.    Vertebrae: No fracture, evidence of discitis, or bone lesion. There is some degenerative endplate signal change at L3-4.   Conus medullaris and cauda equina: Conus extends to the T12 level. Conus and cauda equina appear normal.   Paraspinal and other soft tissues: Negative.   Disc levels:   T11-12 and T12-L1 are imaged in the sagittal plane only and negative.   L1-2: Minimal disc bulge without stenosis.   L2-3: Shallow disc bulge and mild ligamentum flavum thickening. Mild central canal and left foraminal narrowing appear unchanged. The right foramen is open.   L3-4: Status post left laminotomy. There has been progression of disease at this level. A new and very large central left paracentral disc protrusion extends into the left foramen. There is severe compression of the thecal sac and narrowing in the lateral recesses, worse on the left. Moderate bilateral foraminal narrowing is worse on the left.   L4-5: Status post left laminotomy as seen on the prior exam. There is a shallow broad-based disc bulge. The narrowing in the subarticular recesses is present which could impact either descending L5 root. Mild bilateral foraminal narrowing. No change.   L5-S1: A right subarticular recess protrusion impinging on the right S1 root appears unchanged. Foramina are open.   IMPRESSION: Since the prior MRI, the patient has developed a large central and left paracentral disc protrusion at L3-4 causing severe compression of the thecal sac and narrowing  in the lateral recesses, worse on the left. Moderate bilateral foraminal narrowing at this level is worse on the left. Prior left laminotomy noted.   No change in a broad-based disc bulge at L4-5 causing narrowing in the subarticular recesses which could impact either descending L5 root. Left laminotomy defect is seen at this level.   No change in a right subarticular recess protrusion at L5-S1. The disc appears to impinge on the  right S1 root.     TODAY'S TREATMENT   Reviewed pt presentation,current function, HEP compliance, and pain level. PT assessed goals and educated pt with progress.   PATIENT SURVEYS:  FOTO Initial/ Prior/ Current:  77 / 34 / 36.  Goal of 48 at visit #13   LOWER EXTREMITY MMT:     MMT and HHD Right eval Left eval Right 11/21 Left 11/21 Right 1/3 Left 1/3 Right 2/21 Left  2/21  Hip flexion 4+/5 ; 21.2 4/5 ; 19.8 4+/5 ; 26.2 4/5 ; 23.6 4+/5 ; 26.2  4+/5; 23.5 5/5; 30.8 4+/5; 25.6  Hip extension                Hip abduction 22.0 21.8 21.9 23.8 25.1 29.1 27.0 29.3  Hip adduction                Hip internal rotation                Hip external rotation 4+/5 4+/5 4+/5 4+/5 4+/5 4+/5 4+/5 4+/5  Knee flexion 5/5 tested in sitting 4/5 tested in sitting   4/5 tested in sitting 4/5 tested in sitting 4/5 tested in sitting 4+/5 tested in sitting 4/5 tested in sitting  Knee extension 5/5 5/5 5/5 5/5 5/5 5/5 5/5 5/5  Ankle dorsiflexion 5/5 5/5 5/5 5/5        Ankle plantarflexion Able to tolerate resistance though weak in sitting Able to tolerate resistance though weak in sitting Able to tolerate resistance though weak in sitting WFL        Ankle inversion                Ankle eversion                 (Blank rows = not tested)         GAIT: Assistive device utilized: Single point cane on R  Level of assistance: Complete Independence Comments:   Pt had no change in gait compared to prior Rx.  He has bilat toe out R > L.  Pt has slower gait and is a a little more unsteady ambulating without cane.  He had one LOB without cane and used the wall for support.     6 MWT:  Pt ambulated 608 ft with SPC.  Pt had no LOB.  Pt stopped at 5 min and 8 sec.  Pt noticed stiffness in L posterior hip though no pain.  Pt had labored breathing toward the end of the 6 MWT.          PATIENT EDUCATION:  Education details:  Goal progress, objective findings, HEP, POC, and rationale of exercises.  PT  answered pt's questions. Person educated: Patient Education method: Explanation, Demonstration, Tactile cues, Verbal cues, and Handouts Education comprehension: verbalized understanding, returned demonstration, verbal cues required, tactile cues required, and needs further education   HOME EXERCISE PROGRAM: Access Code: [email or web address] URL: [email or web address] Date: 05/12/2022 Prepared by: Ronny Flurry  Exercises - Supine Transversus Abdominis Bracing - Hands on Stomach  - 2  x daily - 7 x weekly - 2 sets - 10 reps - 5 seconds hold - Supine March  - 1 x daily - 7 x weekly - 2 sets - 10 reps - Hooklying Clamshell with Resistance  - 1 x daily - 4-5 x weekly - 2 sets - 10 reps - Supine Active Straight Leg Raise  - 1 x daily - 5-6 x weekly - 2 sets - 10 reps - Clamshell  - 1 x daily - 5-6 x weekly - 2 sets - 10 reps  ASSESSMENT:  CLINICAL IMPRESSION: Pt reports improved sx's including pain and stiffness though continues to have functional limitations including ambulation and transfers.  He is limited with standing duration.  Pt has been limited with current PT appt's due to the clinic schedule availability and also having a new onset of pain which caused him to return to MD.  Pt is improving with bilat strength, more in R LE.  Pt performed 6 MWT test today.  He had limited ambulation distance and is below his age related norms for the 6 MWT.  Pt had no significant change in gait and has worse quality of gait without cane.  He was a little more unsteady with gait without cane.  Pt demonstrates improved self perceived disability with FOTO score improving from prior 34 to 46 currently.  Pt should benefit from cont skilled PT services to address LTG's and improve overall function.       OBJECTIVE IMPAIRMENTS Abnormal gait, decreased activity tolerance, decreased endurance, decreased mobility, difficulty walking, decreased strength, impaired flexibility, and pain.   ACTIVITY LIMITATIONS  standing, squatting, stairs, transfers, bed mobility, and locomotion level  PARTICIPATION LIMITATIONS: meal prep, cleaning, and community activity  PERSONAL FACTORS 3+ comorbidities: multiple back surgeries, OA, Bilat knee pain R > L, Peripheral neuropathy, Pseudogout, DM type II, and L knee scope   are also affecting patient's functional outcome.   REHAB POTENTIAL: Good  CLINICAL DECISION MAKING: Evolving/moderate complexity  EVALUATION COMPLEXITY: Moderate   GOALS:   SHORT TERM GOALS: Target date: 06/02/2022  Pt will be independent with HEP for improved pain, strength, and function.  Baseline: Goal status: Goal met  2.  Pt will tolerate aquatic therapy without adverse effects for improved strength and tolerance to activity.  Baseline:  Goal status: Discontinue.  Pt doesn't want to perform aquatic therapy. Target date: 05/26/2022  3.  Pt will demo improved gait speed and increased step length.  Baseline:  Goal status: goal met  4.  Pt will report at least a 25% improvement in pain and tolerance to daily mobility.  Baseline:  Goal status: Goal met    LONG TERM GOALS: Target date: 11/10/2022   Pt will be able to perform his daily transfers without difficultly.  Baseline:  Goal status: ongoing  2.  Pt will demo at least a 6-8 lb increase in bilat hip flexion and hip abd strength and L knee flexion to 5/5 MMT for improved performance of functional mobility skills including ambulation, stairs, and transfers.  Baseline:  Goal status: progressing  3.  Pt will report improved standing tolerance with ADLs/IADLs including preparing food and drying off after a shower.   Baseline:  Goal status: Ongoing  4.  Pt will report improved tolerance with community ambulation and the ability to ambulate his normal distance without significant difficulty and pain.   Baseline:  Goal status: progressing  5.  Pt will increase his 6 MWT ambulation distance by 100 ft for improved  tolerance  with community ambulation.    Goal status:  INITIAL   PLAN: PT FREQUENCY: 1-2xwk  PT DURATION: 6 weeks  PLANNED INTERVENTIONS: Therapeutic exercises, Therapeutic activity, Neuromuscular re-education, Balance training, Gait training, Patient/Family education, Self Care, Joint mobilization, Stair training, Aquatic Therapy, Dry Needling, Electrical stimulation, Cryotherapy, Moist heat, Taping, Manual therapy, and Re-evaluation.  PLAN FOR NEXT SESSION:   Cont with land therapy.  Core, LE strengthening, and proprioceptive activities.  PN/recert completed     Selinda Michaels III PT, DPT 09/30/22 2:27 PM

## 2022-09-30 ENCOUNTER — Other Ambulatory Visit: Payer: Self-pay | Admitting: Cardiology

## 2022-09-30 ENCOUNTER — Encounter (HOSPITAL_BASED_OUTPATIENT_CLINIC_OR_DEPARTMENT_OTHER): Payer: Self-pay | Admitting: Physical Therapy

## 2022-09-30 DIAGNOSIS — R079 Chest pain, unspecified: Secondary | ICD-10-CM

## 2022-10-06 ENCOUNTER — Ambulatory Visit (HOSPITAL_BASED_OUTPATIENT_CLINIC_OR_DEPARTMENT_OTHER): Payer: Medicare Other | Admitting: Physical Therapy

## 2022-10-06 ENCOUNTER — Encounter (HOSPITAL_BASED_OUTPATIENT_CLINIC_OR_DEPARTMENT_OTHER): Payer: Self-pay | Admitting: Physical Therapy

## 2022-10-06 DIAGNOSIS — M5459 Other low back pain: Secondary | ICD-10-CM

## 2022-10-06 DIAGNOSIS — M6281 Muscle weakness (generalized): Secondary | ICD-10-CM | POA: Diagnosis not present

## 2022-10-06 DIAGNOSIS — R262 Difficulty in walking, not elsewhere classified: Secondary | ICD-10-CM | POA: Diagnosis not present

## 2022-10-06 NOTE — Therapy (Signed)
OUTPATIENT PHYSICAL THERAPY  TREATMENT NOTE           Patient Name: Seth Young MRN: [id number] DOB:1940-08-25, 82 y.o., male Today's Date: 10/06/2022   PT End of Session - 10/06/22 1332     Visit Number 15    Number of Visits 20    Date for PT Re-Evaluation 11/10/22    Authorization Type [employer] MCR    PT Start Time 1305    PT Stop Time 1346    PT Time Calculation (min) 41 min    Activity Tolerance Patient tolerated treatment well    Behavior During Therapy WFL for tasks assessed/performed                   Past Medical History:  Diagnosis Date   Arthritis    "knees, toes, hands, probably in my back" (03/09/2018)   Ataxia    Basal cell carcinoma    left temporal area-no residual   Colon polyps 10-12-11   past hx.   Coronary artery disease    Exertional angina 06/26/2014   Cath with DES to the OM, Bioflow protocol    GERD (gastroesophageal reflux disease)    H/O hiatal hernia    no problems   High cholesterol    History of echocardiogram    Echo 7/19:  Mild LVH, mild focal basal septal hypertrophy, EF 60-65, no RWMA, Gr 1 DD, trivial AI, MAC   Hypertension    Ischemic chest pain (HCC) 08/07/2014   Occasional tremors    Bilateral hands   Peripheral neuropathy    Pneumonia 1940's X 2; ~ 2017   "infant; walking pneumonia" (03/09/2018)   Pseudogout    "it moves around"   Sleep apnea    no cpap used   Type II diabetes mellitus ([place]) dx'd 1982   nsulin started ~ 2000   Vertigo    Past Surgical History:  Procedure Laterality Date   BACK SURGERY     BASAL CELL CARCINOMA EXCISION Left ~ 2012   face   CARDIAC CATHETERIZATION  02/2005   Dr. Mare Ferrari; negative.   CARDIAC CATHETERIZATION N/A 05/13/2016   Procedure: Left Heart Cath and Coronary Angiography;  Surgeon: Jerline Pain, MD;  Location: [facility];  Service: Cardiovascular;  Laterality: N/A;   CARPAL TUNNEL RELEASE Left 10/2009   CARPAL TUNNEL RELEASE Right ~ 2013   CATARACT EXTRACTION W/  INTRAOCULAR LENS IMPLANT Left 2000's   CATARACT EXTRACTION W/ INTRAOCULAR LENS IMPLANT Right 2015   COLONOSCOPY W/ POLYPECTOMY     CORONARY ANGIOPLASTY WITH STENT PLACEMENT  06/26/2014   "1"   CORONARY ANGIOPLASTY WITH STENT PLACEMENT  06/26/2014   pLAD 50%, d LAD 60%, oD1 80%,  mD1  70%, CFX 50%, OM 2 70%,  RCA 80%, PDA 95% (<63m), OM1 99%-0% with Bio flow stent        CORONARY ANGIOPLASTY WITH STENT PLACEMENT  03/09/2018   CORONARY STENT INTERVENTION N/A 03/09/2018   Procedure: CORONARY STENT INTERVENTION;  Surgeon: MBurnell Blanks MD;  [facility];  Service: Cardiovascular;  Laterality: N/A;   ELBOW SURGERY Bilateral ~ 2014   "for blockages; Dr. GAmedeo Plenty   ENDARTERECTOMY Left 04/23/2015   Procedure: ENDARTERECTOMY CAROTID;  Surgeon: BConrad Metairie MD;  [place] [facility]  Service: Vascular;  Laterality: Left;   EYE SURGERY Left    "related go diabetes; laser"   KNEE ARTHROSCOPY Left 05/2011   LEFT HEART CATH AND CORONARY ANGIOGRAPHY N/A 03/09/2018  Procedure: LEFT HEART CATH AND CORONARY ANGIOGRAPHY;  Surgeon: Burnell Blanks, MD;  Location: [facility];  Service: Cardiovascular;  Laterality: N/A;   LEFT HEART CATHETERIZATION WITH CORONARY ANGIOGRAM N/A 06/26/2014   Procedure: LEFT HEART CATHETERIZATION WITH CORONARY ANGIOGRAM;  Surgeon: Blane Ohara, MD;  Location: [facility];  Service: Cardiovascular;  Laterality: N/A;   LEFT HEART CATHETERIZATION WITH CORONARY ANGIOGRAM N/A 08/07/2014   Procedure: LEFT HEART CATHETERIZATION WITH CORONARY ANGIOGRAM;  Surgeon: Blane Ohara, MD;  Location: [facility];  Service: Cardiovascular;  Laterality: N/A;   LUMBAR LAMINECTOMY/DECOMPRESSION MICRODISCECTOMY Left 11/02/2017   Procedure: MICRODISCECTOMY LUMBAR THREE- LUMBAR FOUR, LEFT;  Surgeon: Ashok Pall, MD;  Location: [facility];  Service: Neurosurgery;  Laterality: Left;   NASAL SEPTUM SURGERY  1979   PATCH ANGIOPLASTY Left 04/23/2015   Procedure: PATCH  ANGIOPLASTY USING 1CM X 6CM XENOSURE BIOLOGIC PATCH;  Surgeon: Conrad Somerset, MD;  Location: [facility];  Service: Vascular;  Laterality: Left;   [place]  10/14/2011   Procedure: ROTATOR CUFF REPAIR SHOULDER OPEN;  Surgeon: Johnn Hai, MD;  Location: [facility];  Service: Orthopedics;  Laterality: Right;   SUBACROMIAL DECOMPRESSION  10/14/2011   Procedure: SUBACROMIAL DECOMPRESSION;  Surgeon: Johnn Hai, MD;  Location: [facility];  Service: Orthopedics;  Laterality: Right;   TRIGGER FINGER RELEASE Right ~ 2013-2014   "3rd & 4th digits"   Patient Active Problem List   Diagnosis Date Noted   Coronary artery disease involving native coronary artery of native heart with angina pectoris ([place]) 03/07/2018   HNP (herniated nucleus pulposus), lumbar 11/02/2017   Carotid artery disease without cerebral infarction ([place]) 04/09/2015   Chest pain of uncertain etiology [id number]   Diabetic neuropathy ([place]) 06/21/2014   Essential hypertension 06/21/2014   Hypercholesterolemia 06/21/2014   Rotator cuff tear 10/15/2011   IDDM (insulin dependent diabetes mellitus) 10/16/2008    PCP: Burnard Bunting, MD  REFERRING PROVIDER: Burnard Bunting, MD   REFERRING DIAG: M54.40 (ICD-10-CM) - Lumbago with sciatica, unspecified side   Rationale for Evaluation and Treatment Rehabilitation  THERAPY DIAG:  Muscle weakness (generalized)  Other low back pain  Difficulty in walking, not elsewhere classified  ONSET DATE: PT order 04/21/2022 / Surgery March 2022  SUBJECTIVE:                                                                                                                                                                                           SUBJECTIVE STATEMENT: Pt states he had increased pain in L thigh and hip after prior Rx  which he thinks was from the 6 MWT.  Pt states the pain in L LE has now turned to soreness.  Pt did some stretches for L  LE which helped a little bit.  Pt used tylenol and mm relaxer which did help some.        PERTINENT HISTORY:  -3 back surgeries:  L3-5 laminotomy and pt thinks he has some screws in lumbar March 2022, Lumbar laminectomy/decompression/microdisctomy in 2019, Lumbar surgery in 1964  -OA, Bilat knee pain R > L, CAD, Peripheral neuropathy, Vertigo, Pseudogout, DM type II and diabetic retinopathy,   -Pt has Insulin monitor and tremors in bilat Ue's R > L  -PSHx:  R shoulder surgery in 2013 and was unable to repair rotator cuff, Coronary angioplasty with stent placement in 2015 and 2019, and L knee arthroscopy in 2012    PAIN:  NPRS:  0/10 lumbar pain ; 7/10 worst, 0/10 best Pt states he can have 6/10 pain in the L posterior hip and L ant thigh with certain movements.      PRECAUTIONS: Other: multiple back surgeries, peripheral neuropathy, R shoulder RCR  WEIGHT BEARING RESTRICTIONS No  FALLS:  Has patient fallen in last 6 months? No  LIVING ENVIRONMENT: Lives with: lives with their spouse Lives in: 1 story home Stairs: 3-4 steps with rail Has following equipment at home: Single point cane, Environmental consultant - 2 wheeled, and Crutches  OCCUPATION: Pt works from home and sets his own hours.  PLOF: Independent; Pt began using SPC in early 2019.  Pt was able to perform daily mobility and daily activities with greater ease and less difficulty  PATIENT GOALS improve mobility, get rid of cane, improve strength legs and back.  Improve lumbar stiffness.  Improve performance of stairs   OBJECTIVE:   DIAGNOSTIC FINDINGS:  FINDINGS: Segmentation:  Standard.   Alignment:  Trace anterolisthesis L3 on L4.   Vertebrae: No fracture, evidence of discitis, or bone lesion. There is some degenerative endplate signal change at L3-4.   Conus medullaris and cauda equina: Conus extends to the T12 level. Conus and cauda equina appear normal.   Paraspinal and other soft tissues: Negative.   Disc levels:    T11-12 and T12-L1 are imaged in the sagittal plane only and negative.   L1-2: Minimal disc bulge without stenosis.   L2-3: Shallow disc bulge and mild ligamentum flavum thickening. Mild central canal and left foraminal narrowing appear unchanged. The right foramen is open.   L3-4: Status post left laminotomy. There has been progression of disease at this level. A new and very large central left paracentral disc protrusion extends into the left foramen. There is severe compression of the thecal sac and narrowing in the lateral recesses, worse on the left. Moderate bilateral foraminal narrowing is worse on the left.   L4-5: Status post left laminotomy as seen on the prior exam. There is a shallow broad-based disc bulge. The narrowing in the subarticular recesses is present which could impact either descending L5 root. Mild bilateral foraminal narrowing. No change.   L5-S1: A right subarticular recess protrusion impinging on the right S1 root appears unchanged. Foramina are open.   IMPRESSION: Since the prior MRI, the patient has developed a large central and left paracentral disc protrusion at L3-4 causing severe compression of the thecal sac and narrowing in the lateral recesses, worse on the left. Moderate bilateral foraminal narrowing at this level is worse on the left. Prior left laminotomy noted.   No change in  a broad-based disc bulge at L4-5 causing narrowing in the subarticular recesses which could impact either descending L5 root. Left laminotomy defect is seen at this level.   No change in a right subarticular recess protrusion at L5-S1. The disc appears to impinge on the right S1 root.     TODAY'S TREATMENT     Gait:  PT assessed gait with lofstrand crutch.  Pt ambulated with 1 lofstrand crutch on R side.  He does have more upright posture with lofstrand crutch than the SPC.  Pt had good stability with lofstrand crutch having no LOB.  PT instructed pt in gait  sequencing with lofstrand crutch.    Therapeutic Exercise: Nustep with LE's L3 x 6 mins  Supine clams with TrA with GTB 2x10 Sidestepping with UE support on rail x 2 laps               Standing heel raises with TrA 2x10              Step ups on 4 inch step with TrA 2x10 reps R LE              Neuro Re-ed Activities:  (for improved core strength, lumbopelvic stabilization, and postural strength)              Supine alt UE/LE with TrA 2x10 Supine SLR with contralateral UE extension 2x10 bilat Supine shoulder extension with TrA 2x10 with 2#   Standing rows with RTB with TrA 2x10             Standing shoulder extension with RTB with TrA 2x10     PATIENT EDUCATION:  Education details:  Goal progress, objective findings, HEP, POC, and rationale of exercises.  PT answered pt's questions. Person educated: Patient Education method: Explanation, Demonstration, Tactile cues, Verbal cues, and Handouts Education comprehension: verbalized understanding, returned demonstration, verbal cues required, tactile cues required, and needs further education   HOME EXERCISE PROGRAM: Access Code: [email or web address] URL: [email or web address] Date: 05/12/2022 Prepared by: Ronny Flurry  Exercises - Supine Transversus Abdominis Bracing - Hands on Stomach  - 2 x daily - 7 x weekly - 2 sets - 10 reps - 5 seconds hold - Supine March  - 1 x daily - 7 x weekly - 2 sets - 10 reps - Hooklying Clamshell with Resistance  - 1 x daily - 4-5 x weekly - 2 sets - 10 reps - Supine Active Straight Leg Raise  - 1 x daily - 5-6 x weekly - 2 sets - 10 reps - Clamshell  - 1 x daily - 5-6 x weekly - 2 sets - 10 reps  ASSESSMENT:  CLINICAL IMPRESSION: Pt returns to PT having increased L LE soreness which he thinks is from the 6 MWT from prior Rx/PN.  Pt brought in a lostrand crutch that he has been using some for gait.  He uses it on his R side.  Pt has good stability and no LOB ambulating with lofstrand crutch.  He  states he feels better with lofstrand crutch over the cane.  Pt performed core exercises on table with good form.  Pt did require cuing and instruction in correct form with standing theraband exercises.  Pt attempted step ups on L LE though PT had pt stop due to L LE pain.  He began to feel it in his R knee towards the end of the 2nd set.  Pt reports no lumbar pain after Rx though states he can feel it in his  L LE though not bad.  Pt should benefit from cont skilled PT services to address LTG's and improve overall function.       OBJECTIVE IMPAIRMENTS Abnormal gait, decreased activity tolerance, decreased endurance, decreased mobility, difficulty walking, decreased strength, impaired flexibility, and pain.   ACTIVITY LIMITATIONS standing, squatting, stairs, transfers, bed mobility, and locomotion level  PARTICIPATION LIMITATIONS: meal prep, cleaning, and community activity  PERSONAL FACTORS 3+ comorbidities: multiple back surgeries, OA, Bilat knee pain R > L, Peripheral neuropathy, Pseudogout, DM type II, and L knee scope   are also affecting patient's functional outcome.   REHAB POTENTIAL: Good  CLINICAL DECISION MAKING: Evolving/moderate complexity  EVALUATION COMPLEXITY: Moderate   GOALS:   SHORT TERM GOALS: Target date: 06/02/2022  Pt will be independent with HEP for improved pain, strength, and function.  Baseline: Goal status: Goal met  2.  Pt will tolerate aquatic therapy without adverse effects for improved strength and tolerance to activity.  Baseline:  Goal status: Discontinue.  Pt doesn't want to perform aquatic therapy. Target date: 05/26/2022  3.  Pt will demo improved gait speed and increased step length.  Baseline:  Goal status: goal met  4.  Pt will report at least a 25% improvement in pain and tolerance to daily mobility.  Baseline:  Goal status: Goal met    LONG TERM GOALS: Target date: 11/10/2022   Pt will be able to perform his daily transfers without  difficultly.  Baseline:  Goal status: ongoing  2.  Pt will demo at least a 6-8 lb increase in bilat hip flexion and hip abd strength and L knee flexion to 5/5 MMT for improved performance of functional mobility skills including ambulation, stairs, and transfers.  Baseline:  Goal status: progressing  3.  Pt will report improved standing tolerance with ADLs/IADLs including preparing food and drying off after a shower.   Baseline:  Goal status: Ongoing  4.  Pt will report improved tolerance with community ambulation and the ability to ambulate his normal distance without significant difficulty and pain.   Baseline:  Goal status: progressing  5.  Pt will increase his 6 MWT ambulation distance by 100 ft for improved tolerance with community ambulation.    Goal status:  INITIAL   PLAN: PT FREQUENCY: 1-2xwk  PT DURATION: 6 weeks  PLANNED INTERVENTIONS: Therapeutic exercises, Therapeutic activity, Neuromuscular re-education, Balance training, Gait training, Patient/Family education, Self Care, Joint mobilization, Stair training, Aquatic Therapy, Dry Needling, Electrical stimulation, Cryotherapy, Moist heat, Taping, Manual therapy, and Re-evaluation.  PLAN FOR NEXT SESSION:   Cont with land therapy.  Core, LE strengthening, and proprioceptive activities.     Selinda Michaels III PT, DPT 10/06/22 9:13 PM

## 2022-10-12 ENCOUNTER — Ambulatory Visit (HOSPITAL_BASED_OUTPATIENT_CLINIC_OR_DEPARTMENT_OTHER): Payer: Medicare Other | Attending: Internal Medicine | Admitting: Physical Therapy

## 2022-10-12 DIAGNOSIS — M6281 Muscle weakness (generalized): Secondary | ICD-10-CM | POA: Insufficient documentation

## 2022-10-12 DIAGNOSIS — R262 Difficulty in walking, not elsewhere classified: Secondary | ICD-10-CM | POA: Diagnosis not present

## 2022-10-12 DIAGNOSIS — M5459 Other low back pain: Secondary | ICD-10-CM | POA: Insufficient documentation

## 2022-10-12 NOTE — Therapy (Signed)
OUTPATIENT PHYSICAL THERAPY  TREATMENT NOTE           Patient Name: Seth Young MRN: [id number] DOB:03-12-1941, 82 y.o., male Today's Date: 10/13/2022   PT End of Session - 10/12/22 1114     Visit Number 16    Number of Visits 20    Date for PT Re-Evaluation 11/10/22    Authorization Type [employer] MCR    PT Start Time 1110    PT Stop Time 1150    PT Time Calculation (min) 40 min    Activity Tolerance Patient tolerated treatment well    Behavior During Therapy WFL for tasks assessed/performed                   Past Medical History:  Diagnosis Date   Arthritis    "knees, toes, hands, probably in my back" (03/09/2018)   Ataxia    Basal cell carcinoma    left temporal area-no residual   Colon polyps 10-12-11   past hx.   Coronary artery disease    Exertional angina 06/26/2014   Cath with DES to the OM, Bioflow protocol    GERD (gastroesophageal reflux disease)    H/O hiatal hernia    no problems   High cholesterol    History of echocardiogram    Echo 7/19:  Mild LVH, mild focal basal septal hypertrophy, EF 60-65, no RWMA, Gr 1 DD, trivial AI, MAC   Hypertension    Ischemic chest pain (HCC) 08/07/2014   Occasional tremors    Bilateral hands   Peripheral neuropathy    Pneumonia 1940's X 2; ~ 2017   "infant; walking pneumonia" (03/09/2018)   Pseudogout    "it moves around"   Sleep apnea    no cpap used   Type II diabetes mellitus ([place]) dx'd 1982   nsulin started ~ 2000   Vertigo    Past Surgical History:  Procedure Laterality Date   BACK SURGERY     BASAL CELL CARCINOMA EXCISION Left ~ 2012   face   CARDIAC CATHETERIZATION  02/2005   Dr. Mare Ferrari; negative.   CARDIAC CATHETERIZATION N/A 05/13/2016   Procedure: Left Heart Cath and Coronary Angiography;  Surgeon: Jerline Pain, MD;  Location: [facility];  Service: Cardiovascular;  Laterality: N/A;   CARPAL TUNNEL RELEASE Left 10/2009   CARPAL TUNNEL RELEASE Right ~ 2013   CATARACT EXTRACTION W/  INTRAOCULAR LENS IMPLANT Left 2000's   CATARACT EXTRACTION W/ INTRAOCULAR LENS IMPLANT Right 2015   COLONOSCOPY W/ POLYPECTOMY     CORONARY ANGIOPLASTY WITH STENT PLACEMENT  06/26/2014   "1"   CORONARY ANGIOPLASTY WITH STENT PLACEMENT  06/26/2014   pLAD 50%, d LAD 60%, oD1 80%,  mD1  70%, CFX 50%, OM 2 70%,  RCA 80%, PDA 95% (<3m), OM1 99%-0% with Bio flow stent        CORONARY ANGIOPLASTY WITH STENT PLACEMENT  03/09/2018   CORONARY STENT INTERVENTION N/A 03/09/2018   Procedure: CORONARY STENT INTERVENTION;  Surgeon: MBurnell Blanks MD;  [facility];  Service: Cardiovascular;  Laterality: N/A;   ELBOW SURGERY Bilateral ~ 2014   "for blockages; Dr. GAmedeo Plenty   ENDARTERECTOMY Left 04/23/2015   Procedure: ENDARTERECTOMY CAROTID;  Surgeon: BConrad Lenora MD;  [place] [facility]  Service: Vascular;  Laterality: Left;   EYE SURGERY Left    "related go diabetes; laser"   KNEE ARTHROSCOPY Left 05/2011   LEFT HEART CATH AND CORONARY ANGIOGRAPHY N/A 03/09/2018  Procedure: LEFT HEART CATH AND CORONARY ANGIOGRAPHY;  Surgeon: Burnell Blanks, MD;  Location: [facility];  Service: Cardiovascular;  Laterality: N/A;   LEFT HEART CATHETERIZATION WITH CORONARY ANGIOGRAM N/A 06/26/2014   Procedure: LEFT HEART CATHETERIZATION WITH CORONARY ANGIOGRAM;  Surgeon: Blane Ohara, MD;  Location: [facility];  Service: Cardiovascular;  Laterality: N/A;   LEFT HEART CATHETERIZATION WITH CORONARY ANGIOGRAM N/A 08/07/2014   Procedure: LEFT HEART CATHETERIZATION WITH CORONARY ANGIOGRAM;  Surgeon: Blane Ohara, MD;  Location: [facility];  Service: Cardiovascular;  Laterality: N/A;   LUMBAR LAMINECTOMY/DECOMPRESSION MICRODISCECTOMY Left 11/02/2017   Procedure: MICRODISCECTOMY LUMBAR THREE- LUMBAR FOUR, LEFT;  Surgeon: Ashok Pall, MD;  Location: [place];  Service: Neurosurgery;  Laterality: Left;   NASAL SEPTUM SURGERY  1979   PATCH ANGIOPLASTY Left 04/23/2015   Procedure: PATCH  ANGIOPLASTY USING 1CM X 6CM XENOSURE BIOLOGIC PATCH;  Surgeon: Conrad Chumuckla, MD;  Location: [place];  Service: Vascular;  Laterality: Left;   [place]  10/14/2011   Procedure: ROTATOR CUFF REPAIR SHOULDER OPEN;  Surgeon: Johnn Hai, MD;  Location: [facility];  Service: Orthopedics;  Laterality: Right;   SUBACROMIAL DECOMPRESSION  10/14/2011   Procedure: SUBACROMIAL DECOMPRESSION;  Surgeon: Johnn Hai, MD;  Location: [facility];  Service: Orthopedics;  Laterality: Right;   TRIGGER FINGER RELEASE Right ~ 2013-2014   "3rd & 4th digits"   Patient Active Problem List   Diagnosis Date Noted   Coronary artery disease involving native coronary artery of native heart with angina pectoris ([place]) 03/07/2018   HNP (herniated nucleus pulposus), lumbar 11/02/2017   Carotid artery disease without cerebral infarction ([place]) 04/09/2015   Chest pain of uncertain etiology [id number]   Diabetic neuropathy ([place]) 06/21/2014   Essential hypertension 06/21/2014   Hypercholesterolemia 06/21/2014   Rotator cuff tear 10/15/2011   IDDM (insulin dependent diabetes mellitus) 10/16/2008    PCP: Burnard Bunting, MD  REFERRING PROVIDER: Burnard Bunting, MD   REFERRING DIAG: M54.40 (ICD-10-CM) - Lumbago with sciatica, unspecified side   Rationale for Evaluation and Treatment Rehabilitation  THERAPY DIAG:  Muscle weakness (generalized)  Other low back pain  Difficulty in walking, not elsewhere classified  ONSET DATE: PT order 04/21/2022 / Surgery March 2022  SUBJECTIVE:                                                                                                                                                                                           SUBJECTIVE STATEMENT: Pt reports having no increased pain after prior Rx.  He felt good after  the stretch last visit.  Pt performed the stretches at home with his hands holding his leg.  Pt states the pain in  L LE has now turned to soreness.  Pt did some stretches for L LE which helped a little bit.  Pt used tylenol and mm relaxer which did help some.        PERTINENT HISTORY:  -3 back surgeries:  L3-5 laminotomy and pt thinks he has some screws in lumbar March 2022, Lumbar laminectomy/decompression/microdisctomy in 2019, Lumbar surgery in 1964  -OA, Bilat knee pain R > L, CAD, Peripheral neuropathy, Vertigo, Pseudogout, DM type II and diabetic retinopathy,   -Pt has Insulin monitor and tremors in bilat Ue's R > L  -PSHx:  R shoulder surgery in 2013 and was unable to repair rotator cuff, Coronary angioplasty with stent placement in 2015 and 2019, and L knee arthroscopy in 2012    PAIN:  NPRS:  0/10 lumbar pain      PRECAUTIONS: Other: multiple back surgeries, peripheral neuropathy, R shoulder RCR  WEIGHT BEARING RESTRICTIONS No  FALLS:  Has patient fallen in last 6 months? No  LIVING ENVIRONMENT: Lives with: lives with their spouse Lives in: 1 story home Stairs: 3-4 steps with rail Has following equipment at home: Single point cane, Environmental consultant - 2 wheeled, and Crutches  OCCUPATION: Pt works from home and sets his own hours.  PLOF: Independent; Pt began using SPC in early 2019.  Pt was able to perform daily mobility and daily activities with greater ease and less difficulty  PATIENT GOALS improve mobility, get rid of cane, improve strength legs and back.  Improve lumbar stiffness.  Improve performance of stairs   OBJECTIVE:   DIAGNOSTIC FINDINGS:  FINDINGS: Segmentation:  Standard.   Alignment:  Trace anterolisthesis L3 on L4.   Vertebrae: No fracture, evidence of discitis, or bone lesion. There is some degenerative endplate signal change at L3-4.   Conus medullaris and cauda equina: Conus extends to the T12 level. Conus and cauda equina appear normal.   Paraspinal and other soft tissues: Negative.   Disc levels:   T11-12 and T12-L1 are imaged in the sagittal plane only  and negative.   L1-2: Minimal disc bulge without stenosis.   L2-3: Shallow disc bulge and mild ligamentum flavum thickening. Mild central canal and left foraminal narrowing appear unchanged. The right foramen is open.   L3-4: Status post left laminotomy. There has been progression of disease at this level. A new and very large central left paracentral disc protrusion extends into the left foramen. There is severe compression of the thecal sac and narrowing in the lateral recesses, worse on the left. Moderate bilateral foraminal narrowing is worse on the left.   L4-5: Status post left laminotomy as seen on the prior exam. There is a shallow broad-based disc bulge. The narrowing in the subarticular recesses is present which could impact either descending L5 root. Mild bilateral foraminal narrowing. No change.   L5-S1: A right subarticular recess protrusion impinging on the right S1 root appears unchanged. Foramina are open.   IMPRESSION: Since the prior MRI, the patient has developed a large central and left paracentral disc protrusion at L3-4 causing severe compression of the thecal sac and narrowing in the lateral recesses, worse on the left. Moderate bilateral foraminal narrowing at this level is worse on the left. Prior left laminotomy noted.   No change in a broad-based disc bulge at L4-5 causing narrowing in the subarticular recesses which could impact  either descending L5 root. Left laminotomy defect is seen at this level.   No change in a right subarticular recess protrusion at L5-S1. The disc appears to impinge on the right S1 root.     TODAY'S TREATMENT    Therapeutic Exercise: Nustep with LE's L4 x 4 mins, L3x 1 min  Supine clams with TrA with GTB 2x10 Supine manual HS stretch 2x20-30 sec bilat Supine HS stretch with strap x 20-30 sec bilat Seated HS stretch x 20-30 sec bilat Sidestepping with UE support on rail x 2 laps               Standing heel raises  with TrA 2x10              Step ups on 4 inch step with TrA x10 and approx 4 reps R LE and approx 8 reps on L LE              Neuro Re-ed Activities:  (for improved core strength, lumbopelvic stabilization, and postural strength)              Supine alt UE/LE with TrA 2x10 Supine SLR with contralateral UE extension 2x10 with R LE and 1x8 with L LE    Standing rows with RTB with TrA 2x10             Standing shoulder extension with RTB with TrA 2x10     PATIENT EDUCATION:  Education details:  Goal progress, objective findings, HEP, POC, and rationale of exercises.  PT answered pt's questions. Person educated: Patient Education method: Explanation, Demonstration, Tactile cues, Verbal cues, and Handouts Education comprehension: verbalized understanding, returned demonstration, verbal cues required, tactile cues required, and needs further education   HOME EXERCISE PROGRAM: Access Code: [email or web address] URL: [email or web address] Date: 05/12/2022 Prepared by: Ronny Flurry  Exercises - Supine Transversus Abdominis Bracing - Hands on Stomach  - 2 x daily - 7 x weekly - 2 sets - 10 reps - 5 seconds hold - Supine March  - 1 x daily - 7 x weekly - 2 sets - 10 reps - Hooklying Clamshell with Resistance  - 1 x daily - 4-5 x weekly - 2 sets - 10 reps - Supine Active Straight Leg Raise  - 1 x daily - 5-6 x weekly - 2 sets - 10 reps - Clamshell  - 1 x daily - 5-6 x weekly - 2 sets - 10 reps  ASSESSMENT:  CLINICAL IMPRESSION: Pt is ambulating with lofstrand crutch and likes that better than the cane.  PT educated pt in correct form with HS stretching and attempted supine and seated HS stretch to determine which is better for him.  He states he has been performing seated HS stretch at home though did require instruction in correct form.  Pt able to obtain a good HS stretch with both exercises.  Pt performed exercises well with cuing and instruction in correct form.  Pt did have some knee  pain with low level step ups and PT limited the amount of step ups.  He responded well to Rx having no increased pain after Rx.  Pt should benefit from cont skilled PT services to address LTG's and improve overall function.       OBJECTIVE IMPAIRMENTS Abnormal gait, decreased activity tolerance, decreased endurance, decreased mobility, difficulty walking, decreased strength, impaired flexibility, and pain.   ACTIVITY LIMITATIONS standing, squatting, stairs, transfers, bed mobility, and locomotion level  PARTICIPATION LIMITATIONS: meal prep, cleaning, and community activity  PERSONAL  FACTORS 3+ comorbidities: multiple back surgeries, OA, Bilat knee pain R > L, Peripheral neuropathy, Pseudogout, DM type II, and L knee scope   are also affecting patient's functional outcome.   REHAB POTENTIAL: Good  CLINICAL DECISION MAKING: Evolving/moderate complexity  EVALUATION COMPLEXITY: Moderate   GOALS:   SHORT TERM GOALS: Target date: 06/02/2022  Pt will be independent with HEP for improved pain, strength, and function.  Baseline: Goal status: Goal met  2.  Pt will tolerate aquatic therapy without adverse effects for improved strength and tolerance to activity.  Baseline:  Goal status: Discontinue.  Pt doesn't want to perform aquatic therapy. Target date: 05/26/2022  3.  Pt will demo improved gait speed and increased step length.  Baseline:  Goal status: goal met  4.  Pt will report at least a 25% improvement in pain and tolerance to daily mobility.  Baseline:  Goal status: Goal met    LONG TERM GOALS: Target date: 11/10/2022   Pt will be able to perform his daily transfers without difficultly.  Baseline:  Goal status: ongoing  2.  Pt will demo at least a 6-8 lb increase in bilat hip flexion and hip abd strength and L knee flexion to 5/5 MMT for improved performance of functional mobility skills including ambulation, stairs, and transfers.  Baseline:  Goal status:  progressing  3.  Pt will report improved standing tolerance with ADLs/IADLs including preparing food and drying off after a shower.   Baseline:  Goal status: Ongoing  4.  Pt will report improved tolerance with community ambulation and the ability to ambulate his normal distance without significant difficulty and pain.   Baseline:  Goal status: progressing  5.  Pt will increase his 6 MWT ambulation distance by 100 ft for improved tolerance with community ambulation.    Goal status:  INITIAL   PLAN: PT FREQUENCY: 1-2xwk  PT DURATION: 6 weeks  PLANNED INTERVENTIONS: Therapeutic exercises, Therapeutic activity, Neuromuscular re-education, Balance training, Gait training, Patient/Family education, Self Care, Joint mobilization, Stair training, Aquatic Therapy, Dry Needling, Electrical stimulation, Cryotherapy, Moist heat, Taping, Manual therapy, and Re-evaluation.  PLAN FOR NEXT SESSION:   Cont with land therapy.  Core, LE strengthening, and proprioceptive activities.     Selinda Michaels III PT, DPT 10/13/22 12:36 PM

## 2022-10-13 ENCOUNTER — Encounter (HOSPITAL_BASED_OUTPATIENT_CLINIC_OR_DEPARTMENT_OTHER): Payer: Medicare Other | Admitting: Physical Therapy

## 2022-10-13 ENCOUNTER — Encounter (HOSPITAL_BASED_OUTPATIENT_CLINIC_OR_DEPARTMENT_OTHER): Payer: Self-pay | Admitting: Physical Therapy

## 2022-10-13 DIAGNOSIS — L433 Subacute (active) lichen planus: Secondary | ICD-10-CM | POA: Diagnosis not present

## 2022-10-13 DIAGNOSIS — H401134 Primary open-angle glaucoma, bilateral, indeterminate stage: Secondary | ICD-10-CM | POA: Diagnosis not present

## 2022-10-14 DIAGNOSIS — E1129 Type 2 diabetes mellitus with other diabetic kidney complication: Secondary | ICD-10-CM | POA: Diagnosis not present

## 2022-10-20 ENCOUNTER — Encounter (HOSPITAL_BASED_OUTPATIENT_CLINIC_OR_DEPARTMENT_OTHER): Payer: Self-pay | Admitting: Physical Therapy

## 2022-10-20 ENCOUNTER — Ambulatory Visit (HOSPITAL_BASED_OUTPATIENT_CLINIC_OR_DEPARTMENT_OTHER): Payer: Medicare Other | Admitting: Physical Therapy

## 2022-10-20 DIAGNOSIS — R262 Difficulty in walking, not elsewhere classified: Secondary | ICD-10-CM | POA: Diagnosis not present

## 2022-10-20 DIAGNOSIS — M6281 Muscle weakness (generalized): Secondary | ICD-10-CM

## 2022-10-20 DIAGNOSIS — M5459 Other low back pain: Secondary | ICD-10-CM | POA: Diagnosis not present

## 2022-10-20 NOTE — Therapy (Signed)
OUTPATIENT PHYSICAL THERAPY  TREATMENT NOTE           Patient Name: Seth Young MRN: [id number] DOB:1940/12/10, 82 y.o., male Today's Date: 10/22/2022     PT End of Session - 10/20/22       Visit Number 17     Number of Visits 20     Date for PT Re-Evaluation 11/10/22     Authorization Type [employer] MCR     PT Start Time 1318     PT Stop Time 1402     PT Time Calculation (min) 44 min     Activity Tolerance Patient tolerated treatment well     Behavior During Therapy WFL for tasks assessed/performed            Past Medical History:  Diagnosis Date   Arthritis    "knees, toes, hands, probably in my back" (03/09/2018)   Ataxia    Basal cell carcinoma    left temporal area-no residual   Colon polyps 10-12-11   past hx.   Coronary artery disease    Exertional angina 06/26/2014   Cath with DES to the OM, Bioflow protocol    GERD (gastroesophageal reflux disease)    H/O hiatal hernia    no problems   High cholesterol    History of echocardiogram    Echo 7/19:  Mild LVH, mild focal basal septal hypertrophy, EF 60-65, no RWMA, Gr 1 DD, trivial AI, MAC   Hypertension    Ischemic chest pain (HCC) 08/07/2014   Occasional tremors    Bilateral hands   Peripheral neuropathy    Pneumonia 1940's X 2; ~ 2017   "infant; walking pneumonia" (03/09/2018)   Pseudogout    "it moves around"   Sleep apnea    no cpap used   Type II diabetes mellitus ([place]) dx'd 1982   nsulin started ~ 2000   Vertigo    Past Surgical History:  Procedure Laterality Date   BACK SURGERY     BASAL CELL CARCINOMA EXCISION Left ~ 2012   face   CARDIAC CATHETERIZATION  02/2005   Dr. Mare Ferrari; negative.   CARDIAC CATHETERIZATION N/A 05/13/2016   Procedure: Left Heart Cath and Coronary Angiography;  Surgeon: Jerline Pain, MD;  Location: [place] [facility];  Service: Cardiovascular;  Laterality: N/A;   CARPAL TUNNEL RELEASE Left 10/2009   CARPAL TUNNEL RELEASE Right ~ 2013   CATARACT EXTRACTION W/  INTRAOCULAR LENS IMPLANT Left 2000's   CATARACT EXTRACTION W/ INTRAOCULAR LENS IMPLANT Right 2015   COLONOSCOPY W/ POLYPECTOMY     CORONARY ANGIOPLASTY WITH STENT PLACEMENT  06/26/2014   "1"   CORONARY ANGIOPLASTY WITH STENT PLACEMENT  06/26/2014   pLAD 50%, d LAD 60%, oD1 80%,  mD1  70%, CFX 50%, OM 2 70%,  RCA 80%, PDA 95% (<39mm), OM1 99%-0% with Bio flow stent        CORONARY ANGIOPLASTY WITH STENT PLACEMENT  03/09/2018   CORONARY STENT INTERVENTION N/A 03/09/2018   Procedure: CORONARY STENT INTERVENTION;  Surgeon: Burnell Blanks, MD;  Location: [place] [facility];  Service: Cardiovascular;  Laterality: N/A;   ELBOW SURGERY Bilateral ~ 2014   "for blockages; Dr. Amedeo Plenty"   ENDARTERECTOMY Left 04/23/2015   Procedure: ENDARTERECTOMY CAROTID;  Surgeon: Conrad [place], MD;  Location: [place];  Service: Vascular;  Laterality: Left;   EYE SURGERY Left    "related go diabetes; laser"   KNEE ARTHROSCOPY Left 05/2011   LEFT HEART CATH AND CORONARY  ANGIOGRAPHY N/A 03/09/2018   Procedure: LEFT HEART CATH AND CORONARY ANGIOGRAPHY;  Surgeon: Burnell Blanks, MD;  Location: [place] [facility];  Service: Cardiovascular;  Laterality: N/A;   LEFT HEART CATHETERIZATION WITH CORONARY ANGIOGRAM N/A 06/26/2014   Procedure: LEFT HEART CATHETERIZATION WITH CORONARY ANGIOGRAM;  Surgeon: Blane Ohara, MD;  Location[facility];  Service: Cardiovascular;  Laterality: N/A;   LEFT HEART CATHETERIZATION WITH CORONARY ANGIOGRAM N/A 08/07/2014   Procedure: LEFT HEART CATHETERIZATION WITH CORONARY ANGIOGRAM;  Surgeon: Blane Ohara, MD;  Location: [facility];  Service: Cardiovascular;  Laterality: N/A;   LUMBAR LAMINECTOMY/DECOMPRESSION MICRODISCECTOMY Left 11/02/2017   Procedure: MICRODISCECTOMY LUMBAR THREE- LUMBAR FOUR, LEFT;  Surgeon: Ashok Pall, MD;  Location: [place];  Service: Neurosurgery;  Laterality: Left;   NASAL SEPTUM SURGERY  1979   PATCH ANGIOPLASTY Left 04/23/2015   Procedure: PATCH  ANGIOPLASTY USING 1CM X 6CM XENOSURE BIOLOGIC PATCH;  Surgeon: Conrad [place], MD;  Location: [place];  Service: Vascular;  Laterality: Left;   [place]  10/14/2011   Procedure: ROTATOR CUFF REPAIR SHOULDER OPEN;  Surgeon: Johnn Hai, MD;  Location: [facility];  Service: Orthopedics;  Laterality: Right;   SUBACROMIAL DECOMPRESSION  10/14/2011   Procedure: SUBACROMIAL DECOMPRESSION;  Surgeon: Johnn Hai, MD;  Location: [facility];  Service: Orthopedics;  Laterality: Right;   TRIGGER FINGER RELEASE Right ~ 2013-2014   "3rd & 4th digits"   Patient Active Problem List   Diagnosis Date Noted   Coronary artery disease involving native coronary artery of native heart with angina pectoris ([place]) 03/07/2018   HNP (herniated nucleus pulposus), lumbar 11/02/2017   Carotid artery disease without cerebral infarction ([place]) 04/09/2015   Chest pain of uncertain etiology [id number]   Diabetic neuropathy ([place]) 06/21/2014   Essential hypertension 06/21/2014   Hypercholesterolemia 06/21/2014   Rotator cuff tear 10/15/2011   IDDM (insulin dependent diabetes mellitus) 10/16/2008    PCP: Burnard Bunting, MD  REFERRING PROVIDER: Burnard Bunting, MD   REFERRING DIAG: M54.40 (ICD-10-CM) - Lumbago with sciatica, unspecified side   Rationale for Evaluation and Treatment Rehabilitation  THERAPY DIAG:  Muscle weakness (generalized)  Other low back pain  Difficulty in walking, not elsewhere classified  ONSET DATE: PT order 04/21/2022 / Surgery March 2022  SUBJECTIVE:                                                                                                                                                                                           SUBJECTIVE STATEMENT: Pt reports having no increased pain after prior Rx.  He likes using the lofstrand crutch over the [facility].  He states he stands straighter using the lofstrand crutch.      PERTINENT  HISTORY:  -3 back surgeries:  L3-5 laminotomy and pt thinks he has some screws in lumbar March 2022, Lumbar laminectomy/decompression/microdisctomy in 2019, Lumbar surgery in 1964  -OA, Bilat knee pain R > L, CAD, Peripheral neuropathy, Vertigo, Pseudogout, DM type II and diabetic retinopathy,   -Pt has Insulin monitor and tremors in bilat Ue's R > L  -PSHx:  R shoulder surgery in 2013 and was unable to repair rotator cuff, Coronary angioplasty with stent placement in 2015 and 2019, and L knee arthroscopy in 2012    PAIN:  NPRS:  0/10 lumbar and bilat knee pain     PRECAUTIONS: Other: multiple back surgeries, peripheral neuropathy, R shoulder RCR  WEIGHT BEARING RESTRICTIONS No  FALLS:  Has patient fallen in last 6 months? No  LIVING ENVIRONMENT: Lives with: lives with their spouse Lives in: 1 story home Stairs: 3-4 steps with rail Has following equipment at home: Single point cane, Environmental consultant - 2 wheeled, and Crutches  OCCUPATION: Pt works from home and sets his own hours.  PLOF: Independent; Pt began using SPC in early 2019.  Pt was able to perform daily mobility and daily activities with greater ease and less difficulty  PATIENT GOALS improve mobility, get rid of cane, improve strength legs and back.  Improve lumbar stiffness.  Improve performance of stairs   OBJECTIVE:   DIAGNOSTIC FINDINGS:  FINDINGS: Segmentation:  Standard.   Alignment:  Trace anterolisthesis L3 on L4.   Vertebrae: No fracture, evidence of discitis, or bone lesion. There is some degenerative endplate signal change at L3-4.   Conus medullaris and cauda equina: Conus extends to the T12 level. Conus and cauda equina appear normal.   Paraspinal and other soft tissues: Negative.   Disc levels:   T11-12 and T12-L1 are imaged in the sagittal plane only and negative.   L1-2: Minimal disc bulge without stenosis.   L2-3: Shallow disc bulge and mild ligamentum flavum thickening. Mild central canal  and left foraminal narrowing appear unchanged. The right foramen is open.   L3-4: Status post left laminotomy. There has been progression of disease at this level. A new and very large central left paracentral disc protrusion extends into the left foramen. There is severe compression of the thecal sac and narrowing in the lateral recesses, worse on the left. Moderate bilateral foraminal narrowing is worse on the left.   L4-5: Status post left laminotomy as seen on the prior exam. There is a shallow broad-based disc bulge. The narrowing in the subarticular recesses is present which could impact either descending L5 root. Mild bilateral foraminal narrowing. No change.   L5-S1: A right subarticular recess protrusion impinging on the right S1 root appears unchanged. Foramina are open.   IMPRESSION: Since the prior MRI, the patient has developed a large central and left paracentral disc protrusion at L3-4 causing severe compression of the thecal sac and narrowing in the lateral recesses, worse on the left. Moderate bilateral foraminal narrowing at this level is worse on the left. Prior left laminotomy noted.   No change in a broad-based disc bulge at L4-5 causing narrowing in the subarticular recesses which could impact either descending L5 root. Left laminotomy defect is seen at this level.   No change in a right subarticular recess protrusion at L5-S1. The disc appears to impinge on the right S1 root.  TODAY'S TREATMENT    Therapeutic Exercise: Nustep with LE's L4 x 3 mins, L3 x 2 min  Supine clams with TrA with GTB 2x10 Supine HS stretch with strap 2 x 20-30 sec bilat Seated HS stretch 2 x 20-30 sec bilat Sidestepping with UE support on rail x 3 laps               Standing heel raises with TrA 2x10              Step ups on 4 inch step with TrA 2x10 R LE and x 10 reps on L LE    Lateral step ups on 4 inch step with TrA x10 bilat              Neuro Re-ed Activities:   (for improved core strength, lumbopelvic stabilization, and postural strength)             Standing on airex with FT 3x30 sec with SBA/CGA with min assist for 2 occasions    Marching on airex with UE support 2x10 with TrA            Supine SLR with contralateral UE extension 2x10 with R LE and 1x8 with L LE    Standing rows with GTB with TrA 2x10             Standing shoulder extension with GTB with TrA 2x10     PATIENT EDUCATION:  Education details:  Goal progress, objective findings, HEP, POC, and rationale of exercises.  PT answered pt's questions. Person educated: Patient Education method: Explanation, Demonstration, Tactile cues, Verbal cues, and Handouts Education comprehension: verbalized understanding, returned demonstration, verbal cues required, tactile cues required, and needs further education   HOME EXERCISE PROGRAM: Access Code: [email or web address] URL: [email or web address] Date: 05/12/2022 Prepared by: Ronny Flurry  Exercises - Supine Transversus Abdominis Bracing - Hands on Stomach  - 2 x daily - 7 x weekly - 2 sets - 10 reps - 5 seconds hold - Supine March  - 1 x daily - 7 x weekly - 2 sets - 10 reps - Hooklying Clamshell with Resistance  - 1 x daily - 4-5 x weekly - 2 sets - 10 reps - Supine Active Straight Leg Raise  - 1 x daily - 5-6 x weekly - 2 sets - 10 reps - Clamshell  - 1 x daily - 5-6 x weekly - 2 sets - 10 reps  ASSESSMENT:  CLINICAL IMPRESSION: Pt is ambulating with lofstrand crutch and likes that better than the cane reporting improved posture.  PT educated pt in correct form with HS stretching and attempted supine and seated HS stretch to determine which is better for him.  Pt prefers supine HS stretch with strap over seated HS stretch.  Pt performed exercises well with cuing and instruction in correct form.  Pt does have some knee pain with low level step ups though pt had improved tolerance today.  Pt gives good effort with all exercises and  demonstrated good tolerance with exercises.  He responded well to Rx having no increased pain after Rx.  Pt should benefit from cont skilled PT services to address LTG's and improve overall function.        OBJECTIVE IMPAIRMENTS Abnormal gait, decreased activity tolerance, decreased endurance, decreased mobility, difficulty walking, decreased strength, impaired flexibility, and pain.   ACTIVITY LIMITATIONS standing, squatting, stairs, transfers, bed mobility, and locomotion level  PARTICIPATION LIMITATIONS: meal prep, cleaning, and community activity  PERSONAL FACTORS 3+ comorbidities: multiple  back surgeries, OA, Bilat knee pain R > L, Peripheral neuropathy, Pseudogout, DM type II, and L knee scope   are also affecting patient's functional outcome.   REHAB POTENTIAL: Good  CLINICAL DECISION MAKING: Evolving/moderate complexity  EVALUATION COMPLEXITY: Moderate   GOALS:   SHORT TERM GOALS: Target date: 06/02/2022  Pt will be independent with HEP for improved pain, strength, and function.  Baseline: Goal status: Goal met  2.  Pt will tolerate aquatic therapy without adverse effects for improved strength and tolerance to activity.  Baseline:  Goal status: Discontinue.  Pt doesn't want to perform aquatic therapy. Target date: 05/26/2022  3.  Pt will demo improved gait speed and increased step length.  Baseline:  Goal status: goal met  4.  Pt will report at least a 25% improvement in pain and tolerance to daily mobility.  Baseline:  Goal status: Goal met    LONG TERM GOALS: Target date: 11/10/2022   Pt will be able to perform his daily transfers without difficultly.  Baseline:  Goal status: ongoing  2.  Pt will demo at least a 6-8 lb increase in bilat hip flexion and hip abd strength and L knee flexion to 5/5 MMT for improved performance of functional mobility skills including ambulation, stairs, and transfers.  Baseline:  Goal status: progressing  3.  Pt will report  improved standing tolerance with ADLs/IADLs including preparing food and drying off after a shower.   Baseline:  Goal status: Ongoing  4.  Pt will report improved tolerance with community ambulation and the ability to ambulate his normal distance without significant difficulty and pain.   Baseline:  Goal status: progressing  5.  Pt will increase his 6 MWT ambulation distance by 100 ft for improved tolerance with community ambulation.    Goal status:  INITIAL   PLAN: PT FREQUENCY: 1-2xwk  PT DURATION: 6 weeks  PLANNED INTERVENTIONS: Therapeutic exercises, Therapeutic activity, Neuromuscular re-education, Balance training, Gait training, Patient/Family education, Self Care, Joint mobilization, Stair training, Aquatic Therapy, Dry Needling, Electrical stimulation, Cryotherapy, Moist heat, Taping, Manual therapy, and Re-evaluation.  PLAN FOR NEXT SESSION:   Cont with land therapy.  Core, LE strengthening, and proprioceptive activities.    Selinda Michaels III PT, DPT 10/22/22 12:11 AM

## 2022-10-27 ENCOUNTER — Ambulatory Visit (HOSPITAL_BASED_OUTPATIENT_CLINIC_OR_DEPARTMENT_OTHER): Payer: Medicare Other | Admitting: Orthopaedic Surgery

## 2022-10-27 ENCOUNTER — Ambulatory Visit (INDEPENDENT_AMBULATORY_CARE_PROVIDER_SITE_OTHER): Payer: Medicare Other

## 2022-10-27 ENCOUNTER — Ambulatory Visit (HOSPITAL_BASED_OUTPATIENT_CLINIC_OR_DEPARTMENT_OTHER): Payer: Medicare Other | Admitting: Physical Therapy

## 2022-10-27 ENCOUNTER — Encounter (HOSPITAL_BASED_OUTPATIENT_CLINIC_OR_DEPARTMENT_OTHER): Payer: Self-pay | Admitting: Physical Therapy

## 2022-10-27 DIAGNOSIS — M25562 Pain in left knee: Secondary | ICD-10-CM | POA: Diagnosis not present

## 2022-10-27 DIAGNOSIS — M25561 Pain in right knee: Secondary | ICD-10-CM | POA: Diagnosis not present

## 2022-10-27 DIAGNOSIS — M19011 Primary osteoarthritis, right shoulder: Secondary | ICD-10-CM | POA: Diagnosis not present

## 2022-10-27 DIAGNOSIS — M25511 Pain in right shoulder: Secondary | ICD-10-CM

## 2022-10-27 DIAGNOSIS — M6281 Muscle weakness (generalized): Secondary | ICD-10-CM | POA: Diagnosis not present

## 2022-10-27 DIAGNOSIS — R262 Difficulty in walking, not elsewhere classified: Secondary | ICD-10-CM | POA: Diagnosis not present

## 2022-10-27 DIAGNOSIS — M5459 Other low back pain: Secondary | ICD-10-CM

## 2022-10-27 DIAGNOSIS — G8929 Other chronic pain: Secondary | ICD-10-CM | POA: Diagnosis not present

## 2022-10-27 NOTE — Therapy (Signed)
OUTPATIENT PHYSICAL THERAPY  TREATMENT NOTE           Patient Name: Seth Young MRN: [id number] DOB:1941/02/06, 82 y.o., male Today's Date: 10/28/2022   PT End of Session - 10/27/22 1350     Visit Number 18    Number of Visits 20    Date for PT Re-Evaluation 11/10/22    Authorization Type [employer] MCR    PT Start Time 1320    PT Stop Time 1359    PT Time Calculation (min) 39 min    Activity Tolerance Patient tolerated treatment well    Behavior During Therapy WFL for tasks assessed/performed                Past Medical History:  Diagnosis Date   Arthritis    "knees, toes, hands, probably in my back" (03/09/2018)   Ataxia    Basal cell carcinoma    left temporal area-no residual   Colon polyps 10-12-11   past hx.   Coronary artery disease    Exertional angina 06/26/2014   Cath with DES to the OM, Bioflow protocol    GERD (gastroesophageal reflux disease)    H/O hiatal hernia    no problems   High cholesterol    History of echocardiogram    Echo 7/19:  Mild LVH, mild focal basal septal hypertrophy, EF 60-65, no RWMA, Gr 1 DD, trivial AI, MAC   Hypertension    Ischemic chest pain (HCC) 08/07/2014   Occasional tremors    Bilateral hands   Peripheral neuropathy    Pneumonia 1940's X 2; ~ 2017   "infant; walking pneumonia" (03/09/2018)   Pseudogout    "it moves around"   Sleep apnea    no cpap used   Type II diabetes mellitus ([place]) dx'd 1982   nsulin started ~ 2000   Vertigo    Past Surgical History:  Procedure Laterality Date   BACK SURGERY     BASAL CELL CARCINOMA EXCISION Left ~ 2012   face   CARDIAC CATHETERIZATION  02/2005   Dr. Mare Ferrari; negative.   CARDIAC CATHETERIZATION N/A 05/13/2016   Procedure: Left Heart Cath and Coronary Angiography;  Surgeon: Jerline Pain, MD;  Location: [facility];  Service: Cardiovascular;  Laterality: N/A;   CARPAL TUNNEL RELEASE Left 10/2009   CARPAL TUNNEL RELEASE Right ~ 2013   CATARACT EXTRACTION W/  INTRAOCULAR LENS IMPLANT Left 2000's   CATARACT EXTRACTION W/ INTRAOCULAR LENS IMPLANT Right 2015   COLONOSCOPY W/ POLYPECTOMY     CORONARY ANGIOPLASTY WITH STENT PLACEMENT  06/26/2014   "1"   CORONARY ANGIOPLASTY WITH STENT PLACEMENT  06/26/2014   pLAD 50%, d LAD 60%, oD1 80%,  mD1  70%, CFX 50%, OM 2 70%,  RCA 80%, PDA 95% (<74mm), OM1 99%-0% with Bio flow stent        CORONARY ANGIOPLASTY WITH STENT PLACEMENT  03/09/2018   CORONARY STENT INTERVENTION N/A 03/09/2018   Procedure: CORONARY STENT INTERVENTION;  Surgeon: Burnell Blanks, MD;  Location: [facility];  Service: Cardiovascular;  Laterality: N/A;   ELBOW SURGERY Bilateral ~ 2014   "for blockages; Dr. Amedeo Plenty"   ENDARTERECTOMY Left 04/23/2015   Procedure: ENDARTERECTOMY CAROTID;  Surgeon: Conrad Arapahoe, MD;  Location: [place];  Service: Vascular;  Laterality: Left;   EYE SURGERY Left    "related go diabetes; laser"   KNEE ARTHROSCOPY Left 05/2011   LEFT HEART CATH AND CORONARY ANGIOGRAPHY N/A 03/09/2018   Procedure: LEFT  HEART CATH AND CORONARY ANGIOGRAPHY;  Surgeon: Burnell Blanks, MD;  Location: [facility];  Service: Cardiovascular;  Laterality: N/A;   LEFT HEART CATHETERIZATION WITH CORONARY ANGIOGRAM N/A 06/26/2014   Procedure: LEFT HEART CATHETERIZATION WITH CORONARY ANGIOGRAM;  Surgeon: Blane Ohara, MD;  Location: [facility];  Service: Cardiovascular;  Laterality: N/A;   LEFT HEART CATHETERIZATION WITH CORONARY ANGIOGRAM N/A 08/07/2014   Procedure: LEFT HEART CATHETERIZATION WITH CORONARY ANGIOGRAM;  Surgeon: Blane Ohara, MD;  Location: [facility];  Service: Cardiovascular;  Laterality: N/A;   LUMBAR LAMINECTOMY/DECOMPRESSION MICRODISCECTOMY Left 11/02/2017   Procedure: MICRODISCECTOMY LUMBAR THREE- LUMBAR FOUR, LEFT;  Surgeon: Ashok Pall, MD;  Location: [place];  Service: Neurosurgery;  Laterality: Left;   NASAL SEPTUM SURGERY  1979   PATCH ANGIOPLASTY Left 04/23/2015   Procedure: PATCH  ANGIOPLASTY USING 1CM X 6CM XENOSURE BIOLOGIC PATCH;  Surgeon: Conrad Waipio, MD;  Location: [place];  Service: Vascular;  Laterality: Left;   [place]  10/14/2011   Procedure: ROTATOR CUFF REPAIR SHOULDER OPEN;  Surgeon: Johnn Hai, MD;  Location: [facility];  Service: Orthopedics;  Laterality: Right;   SUBACROMIAL DECOMPRESSION  10/14/2011   Procedure: SUBACROMIAL DECOMPRESSION;  Surgeon: Johnn Hai, MD;  Location: [facility];  Service: Orthopedics;  Laterality: Right;   TRIGGER FINGER RELEASE Right ~ 2013-2014   "3rd & 4th digits"   Patient Active Problem List   Diagnosis Date Noted   Coronary artery disease involving native coronary artery of native heart with angina pectoris ([place]) 03/07/2018   HNP (herniated nucleus pulposus), lumbar 11/02/2017   Carotid artery disease without cerebral infarction ([place]) 04/09/2015   Chest pain of uncertain etiology [id number]   Diabetic neuropathy ([place]) 06/21/2014   Essential hypertension 06/21/2014   Hypercholesterolemia 06/21/2014   Rotator cuff tear 10/15/2011   IDDM (insulin dependent diabetes mellitus) 10/16/2008    PCP: Burnard Bunting, MD  REFERRING PROVIDER: Burnard Bunting, MD   REFERRING DIAG: M54.40 (ICD-10-CM) - Lumbago with sciatica, unspecified side   Rationale for Evaluation and Treatment Rehabilitation  THERAPY DIAG:  Muscle weakness (generalized)  Other low back pain  Difficulty in walking, not elsewhere classified  ONSET DATE: PT order 04/21/2022 / Surgery March 2022  SUBJECTIVE:                                                                                                                                                                                           SUBJECTIVE STATEMENT: Pt reports having no increased pain after prior Rx.  Pt sees MD today after PT  to check his shoulder out and possibly get injections for his knees.  He is going on a trip soon.  Pt has  been performing the seated HS stretch which helps.  "I feel stronger."  He likes using the lofstrand crutch over the [facility].  He states he stands straighter using the lofstrand crutch.  Pt states he has been walking around more at home without an AD.     PERTINENT HISTORY:  -3 back surgeries:  L3-5 laminotomy and pt thinks he has some screws in lumbar March 2022, Lumbar laminectomy/decompression/microdisctomy in 2019, Lumbar surgery in 1964  -OA, Bilat knee pain R > L, CAD, Peripheral neuropathy, Vertigo, Pseudogout, DM type II and diabetic retinopathy,   -Pt has Insulin monitor and tremors in bilat Ue's R > L  -PSHx:  R shoulder surgery in 2013 and was unable to repair rotator cuff, Coronary angioplasty with stent placement in 2015 and 2019, and L knee arthroscopy in 2012    PAIN:  NPRS:  0/10 lumbar and bilat knee pain     PRECAUTIONS: Other: multiple back surgeries, peripheral neuropathy, R shoulder RCR  WEIGHT BEARING RESTRICTIONS No  FALLS:  Has patient fallen in last 6 months? No  LIVING ENVIRONMENT: Lives with: lives with their spouse Lives in: 1 story home Stairs: 3-4 steps with rail Has following equipment at home: Single point cane, Environmental consultant - 2 wheeled, and Crutches  OCCUPATION: Pt works from home and sets his own hours.  PLOF: Independent; Pt began using SPC in early 2019.  Pt was able to perform daily mobility and daily activities with greater ease and less difficulty  PATIENT GOALS improve mobility, get rid of cane, improve strength legs and back.  Improve lumbar stiffness.  Improve performance of stairs   OBJECTIVE:   DIAGNOSTIC FINDINGS:  FINDINGS: Segmentation:  Standard.   Alignment:  Trace anterolisthesis L3 on L4.   Vertebrae: No fracture, evidence of discitis, or bone lesion. There is some degenerative endplate signal change at L3-4.   Conus medullaris and cauda equina: Conus extends to the T12 level. Conus and cauda equina appear normal.    Paraspinal and other soft tissues: Negative.   Disc levels:   T11-12 and T12-L1 are imaged in the sagittal plane only and negative.   L1-2: Minimal disc bulge without stenosis.   L2-3: Shallow disc bulge and mild ligamentum flavum thickening. Mild central canal and left foraminal narrowing appear unchanged. The right foramen is open.   L3-4: Status post left laminotomy. There has been progression of disease at this level. A new and very large central left paracentral disc protrusion extends into the left foramen. There is severe compression of the thecal sac and narrowing in the lateral recesses, worse on the left. Moderate bilateral foraminal narrowing is worse on the left.   L4-5: Status post left laminotomy as seen on the prior exam. There is a shallow broad-based disc bulge. The narrowing in the subarticular recesses is present which could impact either descending L5 root. Mild bilateral foraminal narrowing. No change.   L5-S1: A right subarticular recess protrusion impinging on the right S1 root appears unchanged. Foramina are open.   IMPRESSION: Since the prior MRI, the patient has developed a large central and left paracentral disc protrusion at L3-4 causing severe compression of the thecal sac and narrowing in the lateral recesses, worse on the left. Moderate bilateral foraminal narrowing at this level is worse on the left. Prior left laminotomy noted.   No change in a  broad-based disc bulge at L4-5 causing narrowing in the subarticular recesses which could impact either descending L5 root. Left laminotomy defect is seen at this level.   No change in a right subarticular recess protrusion at L5-S1. The disc appears to impinge on the right S1 root.     TODAY'S TREATMENT    Therapeutic Exercise: Nustep with LE's L4 x5 mins  Seated HS stretch 2 x 30 sec bilat Sidestepping with UE support on rail x 3 laps               Standing heel raises with TrA  2x10  Therapeutic Activities:              Step ups on 4 inch step with TrA 2x10 R LE and x 10 reps on L LE    Lateral step ups on 4 inch step with TrA x10 bilat   Sit to stands with RTB around LE's x 7 and x 5 reps              Neuro Re-ed Activities:  (for improved core strength, lumbopelvic stabilization, and postural strength)             Standing on airex with FT 3x30 sec with SBA/CGA with min assist for 1 occasion    Marching on airex with UE support 2x10 with TrA            Standing rows with GTB with TrA 2x10             Standing shoulder extension with GTB with TrA 2x10     PATIENT EDUCATION:  Education details:  Goal progress, objective findings, HEP, POC, and rationale of exercises.  PT answered pt's questions. Person educated: Patient Education method: Explanation, Demonstration, Tactile cues, Verbal cues, and Handouts Education comprehension: verbalized understanding, returned demonstration, verbal cues required, tactile cues required, and needs further education   HOME EXERCISE PROGRAM: Access Code: [email or web address] URL: [email or web address] Date: 05/12/2022 Prepared by: Ronny Flurry  Exercises - Supine Transversus Abdominis Bracing - Hands on Stomach  - 2 x daily - 7 x weekly - 2 sets - 10 reps - 5 seconds hold - Supine March  - 1 x daily - 7 x weekly - 2 sets - 10 reps - Hooklying Clamshell with Resistance  - 1 x daily - 4-5 x weekly - 2 sets - 10 reps - Supine Active Straight Leg Raise  - 1 x daily - 5-6 x weekly - 2 sets - 10 reps - Clamshell  - 1 x daily - 5-6 x weekly - 2 sets - 10 reps  ASSESSMENT:  CLINICAL IMPRESSION: Pt continues to ambulate with lofstrand crutch and likes that better than the cane reporting improved posture.  Pt states he has been performing the seated HS stretch and that has helped.  He prefers the seated HS stretch over the supine HS stretch with strap.  Pt performed exercises well and gives good effort with all exercises.  Pt  performed low level step ups without knee c/o's today.  Pt has difficulty with performing sit to stands without UE's.  PT raised table to an elevated height in order for pt to perform sit to stands.  He responded well to Rx having no pain after Rx.      OBJECTIVE IMPAIRMENTS Abnormal gait, decreased activity tolerance, decreased endurance, decreased mobility, difficulty walking, decreased strength, impaired flexibility, and pain.   ACTIVITY LIMITATIONS standing, squatting, stairs, transfers, bed mobility, and locomotion level  PARTICIPATION LIMITATIONS:  meal prep, cleaning, and community activity  PERSONAL FACTORS 3+ comorbidities: multiple back surgeries, OA, Bilat knee pain R > L, Peripheral neuropathy, Pseudogout, DM type II, and L knee scope   are also affecting patient's functional outcome.   REHAB POTENTIAL: Good  CLINICAL DECISION MAKING: Evolving/moderate complexity  EVALUATION COMPLEXITY: Moderate   GOALS:   SHORT TERM GOALS: Target date: 06/02/2022  Pt will be independent with HEP for improved pain, strength, and function.  Baseline: Goal status: Goal met  2.  Pt will tolerate aquatic therapy without adverse effects for improved strength and tolerance to activity.  Baseline:  Goal status: Discontinue.  Pt doesn't want to perform aquatic therapy. Target date: 05/26/2022  3.  Pt will demo improved gait speed and increased step length.  Baseline:  Goal status: goal met  4.  Pt will report at least a 25% improvement in pain and tolerance to daily mobility.  Baseline:  Goal status: Goal met    LONG TERM GOALS: Target date: 11/10/2022   Pt will be able to perform his daily transfers without difficultly.  Baseline:  Goal status: ongoing  2.  Pt will demo at least a 6-8 lb increase in bilat hip flexion and hip abd strength and L knee flexion to 5/5 MMT for improved performance of functional mobility skills including ambulation, stairs, and transfers.  Baseline:   Goal status: progressing  3.  Pt will report improved standing tolerance with ADLs/IADLs including preparing food and drying off after a shower.   Baseline:  Goal status: Ongoing  4.  Pt will report improved tolerance with community ambulation and the ability to ambulate his normal distance without significant difficulty and pain.   Baseline:  Goal status: progressing  5.  Pt will increase his 6 MWT ambulation distance by 100 ft for improved tolerance with community ambulation.    Goal status:  INITIAL   PLAN: PT FREQUENCY: 1-2xwk  PT DURATION: 6 weeks  PLANNED INTERVENTIONS: Therapeutic exercises, Therapeutic activity, Neuromuscular re-education, Balance training, Gait training, Patient/Family education, Self Care, Joint mobilization, Stair training, Aquatic Therapy, Dry Needling, Electrical stimulation, Cryotherapy, Moist heat, Taping, Manual therapy, and Re-evaluation.  PLAN FOR NEXT SESSION:   Cont with land therapy.  Core, LE strengthening, and proprioceptive activities.    Selinda Michaels III PT, DPT 10/28/22 1:38 PM

## 2022-10-27 NOTE — Progress Notes (Signed)
Chief Complaint: Right Shoulder and Bilateral knee pain     History of Present Illness:   Seth Young is a 82 y.o. male presenting to clinic for evaluation of right shoulder pain and follow-up for bilateral knee pain.  He reports significant relief from his knee injections in November, and would like to repeat those today.  He has longstanding history of right shoulder pain.  Dr. Tonita Cong performed a rotator cuff repair in 2013.  He reports pain and weakness with daily activities, such as lifting a jug of milk.  He previously tried physical therapy which exacerbated his symptoms.  He has been pushing through the pain on his own.   Surgical History:   None  PMH/PSH/Family History/Social History/Meds/Allergies:    Past Medical History:  Diagnosis Date   Arthritis    "knees, toes, hands, probably in my back" (03/09/2018)   Ataxia    Basal cell carcinoma    left temporal area-no residual   Colon polyps 10-12-11   past hx.   Coronary artery disease    Exertional angina 06/26/2014   Cath with DES to the OM, Bioflow protocol    GERD (gastroesophageal reflux disease)    H/O hiatal hernia    no problems   High cholesterol    History of echocardiogram    Echo 7/19:  Mild LVH, mild focal basal septal hypertrophy, EF 60-65, no RWMA, Gr 1 DD, trivial AI, MAC   Hypertension    Ischemic chest pain (HCC) 08/07/2014   Occasional tremors    Bilateral hands   Peripheral neuropathy    Pneumonia 1940's X 2; ~ 2017   "infant; walking pneumonia" (03/09/2018)   Pseudogout    "it moves around"   Sleep apnea    no cpap used   Type II diabetes mellitus ([place]) dx'd 1982   nsulin started ~ 2000   Vertigo    Past Surgical History:  Procedure Laterality Date   BACK SURGERY     BASAL CELL CARCINOMA EXCISION Left ~ 2012   face   CARDIAC CATHETERIZATION  02/2005   Dr. Mare Ferrari; negative.   CARDIAC CATHETERIZATION N/A 05/13/2016   Procedure: Left Heart Cath and  Coronary Angiography;  Surgeon: Jerline Pain, MD;  Location: [facility];  Service: Cardiovascular;  Laterality: N/A;   CARPAL TUNNEL RELEASE Left 10/2009   CARPAL TUNNEL RELEASE Right ~ 2013   CATARACT EXTRACTION W/ INTRAOCULAR LENS IMPLANT Left 2000's   CATARACT EXTRACTION W/ INTRAOCULAR LENS IMPLANT Right 2015   COLONOSCOPY W/ POLYPECTOMY     CORONARY ANGIOPLASTY WITH STENT PLACEMENT  06/26/2014   "1"   CORONARY ANGIOPLASTY WITH STENT PLACEMENT  06/26/2014   pLAD 50%, d LAD 60%, oD1 80%,  mD1  70%, CFX 50%, OM 2 70%,  RCA 80%, PDA 95% (<57mm), OM1 99%-0% with Bio flow stent        CORONARY ANGIOPLASTY WITH STENT PLACEMENT  03/09/2018   CORONARY STENT INTERVENTION N/A 03/09/2018   Procedure: CORONARY STENT INTERVENTION;  Surgeon: Burnell Blanks, MD;  Location: [facility];  Service: Cardiovascular;  Laterality: N/A;   ELBOW SURGERY Bilateral ~ 2014   "for blockages; Dr. Amedeo Plenty"   ENDARTERECTOMY Left 04/23/2015   Procedure: ENDARTERECTOMY CAROTID;  Surgeon: Conrad Lonsdale, MD;  Location: [place] [facility];  Service: Vascular;  Laterality: Left;   EYE SURGERY Left    "related go diabetes; laser"   KNEE ARTHROSCOPY Left 05/2011   LEFT HEART CATH AND CORONARY ANGIOGRAPHY N/A 03/09/2018   Procedure: LEFT HEART CATH AND CORONARY ANGIOGRAPHY;  Surgeon: Burnell Blanks, MD;  Location: [facility];  Service: Cardiovascular;  Laterality: N/A;   LEFT HEART CATHETERIZATION WITH CORONARY ANGIOGRAM N/A 06/26/2014   Procedure: LEFT HEART CATHETERIZATION WITH CORONARY ANGIOGRAM;  Surgeon: Blane Ohara, MD;  Location: [facility];  Service: Cardiovascular;  Laterality: N/A;   LEFT HEART CATHETERIZATION WITH CORONARY ANGIOGRAM N/A 08/07/2014   Procedure: LEFT HEART CATHETERIZATION WITH CORONARY ANGIOGRAM;  Surgeon: Blane Ohara, MD;  Location: [facility];  Service: Cardiovascular;  Laterality: N/A;   LUMBAR LAMINECTOMY/DECOMPRESSION MICRODISCECTOMY Left 11/02/2017   Procedure:  MICRODISCECTOMY LUMBAR THREE- LUMBAR FOUR, LEFT;  Surgeon: Ashok Pall, MD;  Location: [place];  Service: Neurosurgery;  Laterality: Left;   NASAL SEPTUM SURGERY  1979   PATCH ANGIOPLASTY Left 04/23/2015   Procedure: PATCH ANGIOPLASTY USING 1CM X 6CM XENOSURE BIOLOGIC PATCH;  Surgeon: Conrad Simla, MD;  Location: [place];  Service: Vascular;  Laterality: Left;   [place]  10/14/2011   Procedure: ROTATOR CUFF REPAIR SHOULDER OPEN;  Surgeon: Johnn Hai, MD;  Location: [facility];  Service: Orthopedics;  Laterality: Right;   SUBACROMIAL DECOMPRESSION  10/14/2011   Procedure: SUBACROMIAL DECOMPRESSION;  Surgeon: Johnn Hai, MD;  Location: [facility];  Service: Orthopedics;  Laterality: Right;   TRIGGER FINGER RELEASE Right ~ 2013-2014   "3rd & 4th digits"   Social History   Socioeconomic History   Marital status: Married    Spouse name: Not on file   Number of children: 5   Years of education: College   Highest education level: Not on file  Occupational History   Not on file  Tobacco Use   Smoking status: Former    Packs/day: 0.50    Years: 20.00    Additional pack years: 0.00    Total pack years: 10.00    Types: Cigarettes    Quit date: 10/11/1988    Years since quitting: 34.0   Smokeless tobacco: Never  Vaping Use   Vaping Use: Never used  Substance and Sexual Activity   Alcohol use: No    Alcohol/week: 0.0 standard drinks of alcohol    Comment: Quit drinking in 1990   Drug use: No   Sexual activity: Not Currently  Other Topics Concern   Not on file  Social History Narrative   Drinks about 2 cups of coffee a day, occasional diet pepsi.    Social Determinants of Health   Financial Resource Strain: Not on file  Food Insecurity: Not on file  Transportation Needs: Not on file  Physical Activity: Not on file  Stress: Not on file  Social Connections: Not on file   Family History  Problem Relation Age of Onset   Diabetes  Mother    Varicose Veins Mother    Heart attack Father 81       "massive heart attack"   Cancer Sister    Diabetes Sister    Hypertension Sister    Varicose Veins Sister    Diabetes Brother    Hypertension Brother    Allergies  Allergen Reactions   Dulaglutide     Other reaction(s): localized site reaction, dyspepsia   Insulin Degludec     Other  reaction(s): Itching and swelling in hands   Gabapentin Other (See Comments)    Ataxia   Morphine And Related Itching        Naproxen Other (See Comments)    Neurological reaction. Tremors, hallucinates    Current Outpatient Medications  Medication Sig Dispense Refill   acetaminophen (TYLENOL) 325 MG tablet Take 650 mg by mouth every 6 (six) hours as needed for moderate pain or headache.      aspirin 81 MG tablet Take 1 tablet (81 mg total) by mouth daily.     atorvastatin (LIPITOR) 20 MG tablet Take 20 mg by mouth daily.     augmented betamethasone dipropionate (DIPROLENE-AF) 0.05 % cream as needed for itching.     chlorhexidine (PERIDEX) 0.12 % solution SWISH WITH 1-2 OUNCE FOR 30 SECONDS THEN SPIT TWICE DAILY as needed for flare up  0   chlorpheniramine-HYDROcodone (TUSSIONEX) 10-8 MG/5ML SUER as needed for cough.     clopidogrel (PLAVIX) 75 MG tablet Take 1 tablet by mouth once daily 90 tablet 1   colchicine 0.6 MG tablet Take 0.6 mg by mouth daily as needed (gout).      Cyanocobalamin (B-12) 5000 MCG CAPS Take 5,000 mcg by mouth daily.     dorzolamide-timolol (COSOPT) 22.3-6.8 MG/ML ophthalmic solution Place 1 drop into both eyes 2 (two) times daily.     empagliflozin (JARDIANCE) 25 MG TABS tablet Take 25 mg by mouth daily.     HUMALOG KWIKPEN 200 UNIT/ML SOPN Inject 10-22 Units as directed 3 (three) times daily. SLIDING SCALE  1   HYDROcodone-acetaminophen (NORCO) 7.5-325 MG tablet as needed for pain.     insulin glargine (LANTUS) 100 UNIT/ML injection Inject 40 Units into the skin 2 (two) times daily. Pt take 40 unit in the  morning and 40 unit at bedtime.     Insulin Pen Needle 31G X 8 MM MISC      isosorbide mononitrate (IMDUR) 60 MG 24 hr tablet TAKE 1 TABLET BY MOUTH TWICE DAILY . APPOINTMENT REQUIRED FOR FUTURE REFILLS [phone] 180 tablet 1   lisinopril (ZESTRIL) 5 MG tablet Take 1 tablet by mouth once daily 90 tablet 3   LUMIGAN 0.01 % SOLN Place 1 drop into the left eye at bedtime.     LYRICA 100 MG capsule Take 100 mg by mouth 2 (two) times daily as needed. For neuropathic pain  2   Melatonin 5 MG TABS Take 5 mg by mouth at bedtime.     metFORMIN (GLUCOPHAGE) 1000 MG tablet Take 1,000 mg by mouth 2 (two) times daily with a meal.     methocarbamol (ROBAXIN) 750 MG tablet as needed for pain.     metoprolol tartrate (LOPRESSOR) 100 MG tablet Take 1 tablet (100 mg total) by mouth 2 (two) times daily. 180 tablet 2   Multiple Vitamin (MULTI VITAMIN) TABS daily.     nitroGLYCERIN (NITROSTAT) 0.4 MG SL tablet DISSOLVE ONE TABLET UNDER THE TONGUE EVERY 5 MINUTES AS NEEDED FOR CHEST PAIN. 25 tablet 0   Omega-3 Fatty Acids (FISH OIL PO) Take 520 mg by mouth daily.     OVER THE COUNTER MEDICATION Apply 1 application topically daily as needed (nerve pain). Theraworx otc pain relief foam     pantoprazole (PROTONIX) 40 MG tablet Take 1 tablet (40 mg total) by mouth 2 (two) times daily. 60 tablet 6   Probiotic Product (PROBIOTIC PO) Take 1 tablet by mouth daily.     pyridoxine (B-6) 100 MG tablet Take 100  mg by mouth daily.     triamcinolone cream (KENALOG) 0.1 % Apply 1 application topically daily as needed (lichen planus). With cetaphil     No current facility-administered medications for this visit.   No results found.  Review of Systems:   A ROS was performed including pertinent positives and negatives as documented in the HPI.  Physical Exam :   Constitutional: NAD and appears stated age Neurological: Alert and oriented Psych: Appropriate affect and cooperative There were no vitals taken for this visit.    Comprehensive Musculoskeletal Exam:    Well-healed incision on right shoulder from previous arthroscopic surgery.  Active range of motion on right shoulder 120 degrees forward flexion compared to 150 on the left. 30 degrees external rotation bilaterally.  Significantly decreased strength in right shoulder with abduction and external rotation.  Positive empty can test. Crepitus noted with shoulder motion. Distal neurosensory exam intact.  Imaging:   Xray (Right Shoulder 3 views): Anchors noted in humeral head from previous rotator cuff repair. Narrowing of space between humeral head and acromion with small osteophytes present. Well preserved glenohumeral joint space.  I personally reviewed and interpreted the radiographs.   Assessment:   82 y.o. male with bilateral knee and right shoulder pain.  We will repeat bilateral intra-articular injections today.  After discussion with the patient concerning his right shoulder, patient would like to continue managing this on his own.  I suspect that he has re-torn his rotator cuff repair. He expressed that he does not wish to consider surgery at the moment, and will follow back up in clinic if he wishes to proceed further.  We discussed the possibility of ordering an MRI in the future to further evaluate the state of his rotator cuff.  Plan :    -Bilateral ultrasound-guided knee injections performed today after verbal consent obtained  -Follow up as needed    Procedure Note  Patient: Seth Young             Date of Birth: June 12, 1941           MRN: [id number]             Visit Date: 10/27/2022  Procedures: Visit Diagnoses:  1. Chronic right shoulder pain     Large Joint Inj: bilateral knee on 10/27/2022 3:33 PM Indications: pain Details: 44 G 1.5 in needle, ultrasound-guided anterior approach Outcome: tolerated well, no immediate complications Consent was given by the patient. Patient was prepped and draped in the usual sterile fashion.        I personally saw and evaluated the patient, and participated in the management and treatment plan.  Marnee Spring, PA-C Orthopedics  This document was dictated using Systems analyst. A reasonable attempt at proof reading has been made to minimize errors.

## 2022-11-03 ENCOUNTER — Ambulatory Visit (HOSPITAL_BASED_OUTPATIENT_CLINIC_OR_DEPARTMENT_OTHER): Payer: Medicare Other | Admitting: Physical Therapy

## 2022-11-03 ENCOUNTER — Encounter (HOSPITAL_BASED_OUTPATIENT_CLINIC_OR_DEPARTMENT_OTHER): Payer: Self-pay | Admitting: Physical Therapy

## 2022-11-03 DIAGNOSIS — R262 Difficulty in walking, not elsewhere classified: Secondary | ICD-10-CM | POA: Diagnosis not present

## 2022-11-03 DIAGNOSIS — M5459 Other low back pain: Secondary | ICD-10-CM | POA: Diagnosis not present

## 2022-11-03 DIAGNOSIS — M6281 Muscle weakness (generalized): Secondary | ICD-10-CM | POA: Diagnosis not present

## 2022-11-03 NOTE — Therapy (Signed)
OUTPATIENT PHYSICAL THERAPY  TREATMENT NOTE           Patient Name: Seth Young MRN: [id number] DOB:[birth year], 82 y.o., male Today's Date: 11/04/2022   PT End of Session - 11/03/22 1347     Visit Number 19    Number of Visits 20    Date for PT Re-Evaluation 11/10/22    Authorization Type [employer] MCR    PT Start Time 1317    PT Stop Time 1402    PT Time Calculation (min) 45 min    Activity Tolerance Patient tolerated treatment well    Behavior During Therapy WFL for tasks assessed/performed                 Past Medical History:  Diagnosis Date   Arthritis    "knees, toes, hands, probably in my back" (03/09/2018)   Ataxia    Basal cell carcinoma    left temporal area-no residual   Colon polyps 10-12-11   past hx.   Coronary artery disease    Exertional angina 06/26/2014   Cath with DES to the OM, Bioflow protocol    GERD (gastroesophageal reflux disease)    H/O hiatal hernia    no problems   High cholesterol    History of echocardiogram    Echo 7/19:  Mild LVH, mild focal basal septal hypertrophy, EF 60-65, no RWMA, Gr 1 DD, trivial AI, MAC   Hypertension    Ischemic chest pain (HCC) 08/07/2014   Occasional tremors    Bilateral hands   Peripheral neuropathy    Pneumonia 1940's X 2; ~ 2017   "infant; walking pneumonia" (03/09/2018)   Pseudogout    "it moves around"   Sleep apnea    no cpap used   Type II diabetes mellitus ([place]) dx'd 1982   nsulin started ~ 2000   Vertigo    Past Surgical History:  Procedure Laterality Date   BACK SURGERY     BASAL CELL CARCINOMA EXCISION Left ~ 2012   face   CARDIAC CATHETERIZATION  02/2005   Dr. Mare Ferrari; negative.   CARDIAC CATHETERIZATION N/A 05/13/2016   Procedure: Left Heart Cath and Coronary Angiography;  Surgeon: Jerline Pain, MD;  Location: [facility];  Service: Cardiovascular;  Laterality: N/A;   CARPAL TUNNEL RELEASE Left 10/2009   CARPAL TUNNEL RELEASE Right ~ 2013   CATARACT EXTRACTION W/  INTRAOCULAR LENS IMPLANT Left 2000's   CATARACT EXTRACTION W/ INTRAOCULAR LENS IMPLANT Right 2015   COLONOSCOPY W/ POLYPECTOMY     CORONARY ANGIOPLASTY WITH STENT PLACEMENT  06/26/2014   "1"   CORONARY ANGIOPLASTY WITH STENT PLACEMENT  06/26/2014   pLAD 50%, d LAD 60%, oD1 80%,  mD1  70%, CFX 50%, OM 2 70%,  RCA 80%, PDA 95% (<7mm), OM1 99%-0% with Bio flow stent        CORONARY ANGIOPLASTY WITH STENT PLACEMENT  03/09/2018   CORONARY STENT INTERVENTION N/A 03/09/2018   Procedure: CORONARY STENT INTERVENTION;  Surgeon: Burnell Blanks, MD;  Location: [facility];  Service: Cardiovascular;  Laterality: N/A;   ELBOW SURGERY Bilateral ~ 2014   "for blockages; Dr. Amedeo Plenty"   ENDARTERECTOMY Left 04/23/2015   Procedure: ENDARTERECTOMY CAROTID;  Surgeon: Conrad Eubank, MD;  Location: [facility];  Service: Vascular;  Laterality: Left;   EYE SURGERY Left    "related go diabetes; laser"   KNEE ARTHROSCOPY Left 05/2011   LEFT HEART CATH AND CORONARY ANGIOGRAPHY N/A 03/09/2018   Procedure:  LEFT HEART CATH AND CORONARY ANGIOGRAPHY;  Surgeon: Burnell Blanks, MD;  Location: [facility];  Service: Cardiovascular;  Laterality: N/A;   LEFT HEART CATHETERIZATION WITH CORONARY ANGIOGRAM N/A 06/26/2014   Procedure: LEFT HEART CATHETERIZATION WITH CORONARY ANGIOGRAM;  Surgeon: Blane Ohara, MD;  Location: [facility];  Service: Cardiovascular;  Laterality: N/A;   LEFT HEART CATHETERIZATION WITH CORONARY ANGIOGRAM N/A 08/07/2014   Procedure: LEFT HEART CATHETERIZATION WITH CORONARY ANGIOGRAM;  Surgeon: Blane Ohara, MD;  Location[facility];  Service: Cardiovascular;  Laterality: N/A;   LUMBAR LAMINECTOMY/DECOMPRESSION MICRODISCECTOMY Left 11/02/2017   Procedure: MICRODISCECTOMY LUMBAR THREE- LUMBAR FOUR, LEFT;  Surgeon: Ashok Pall, MD;  Location: [place];  Service: Neurosurgery;  Laterality: Left;   NASAL SEPTUM SURGERY  1979   PATCH ANGIOPLASTY Left 04/23/2015   Procedure: PATCH  ANGIOPLASTY USING 1CM X 6CM XENOSURE BIOLOGIC PATCH;  Surgeon: Conrad [place], MD;  Location: [place];  Service: Vascular;  Laterality: Left;   [place]  10/14/2011   Procedure: ROTATOR CUFF REPAIR SHOULDER OPEN;  Surgeon: Johnn Hai, MD;  Location: [facility];  Service: Orthopedics;  Laterality: Right;   SUBACROMIAL DECOMPRESSION  10/14/2011   Procedure: SUBACROMIAL DECOMPRESSION;  Surgeon: Johnn Hai, MD;  Location: [facility];  Service: Orthopedics;  Laterality: Right;   TRIGGER FINGER RELEASE Right ~ 2013-2014   "3rd & 4th digits"   Patient Active Problem List   Diagnosis Date Noted   Coronary artery disease involving native coronary artery of native heart with angina pectoris ([place]) 03/07/2018   HNP (herniated nucleus pulposus), lumbar 11/02/2017   Carotid artery disease without cerebral infarction ([place]) 04/09/2015   Chest pain of uncertain etiology [id number]   Diabetic neuropathy ([place]) 06/21/2014   Essential hypertension 06/21/2014   Hypercholesterolemia 06/21/2014   Rotator cuff tear 10/15/2011   IDDM (insulin dependent diabetes mellitus) 10/16/2008    PCP: Burnard Bunting, MD  REFERRING PROVIDER: Burnard Bunting, MD   REFERRING DIAG: M54.40 (ICD-10-CM) - Lumbago with sciatica, unspecified side   Rationale for Evaluation and Treatment Rehabilitation  THERAPY DIAG:  Muscle weakness (generalized)  Other low back pain  Difficulty in walking, not elsewhere classified  ONSET DATE: PT order 04/21/2022 / Surgery March 2022  SUBJECTIVE:                                                                                                                                                                                           SUBJECTIVE STATEMENT: Pt saw ortho MD concerning his knees and R shoulder.  He received cortisone shots  in bilat knees which helped.  Pt reports having no increased pain after prior Rx.  Pt reports he has not  been doing the S/L clam due to that bothering his knee.  Pt has also not been performing supine clam.  Pt is upset about his schedule and wants 1:15 appt's on Wednesdays.  He cancelled his following 2 appt's in April that were not at that time.        PERTINENT HISTORY:  -3 back surgeries:  L3-5 laminotomy and pt thinks he has some screws in lumbar March 2022, Lumbar laminectomy/decompression/microdisctomy in 2019, Lumbar surgery in 1964  -OA, Bilat knee pain R > L, CAD, Peripheral neuropathy, Vertigo, Pseudogout, DM type II and diabetic retinopathy,   -Pt has Insulin monitor and tremors in bilat Ue's R > L  -PSHx:  R shoulder surgery in 2013 and was unable to repair rotator cuff, Coronary angioplasty with stent placement in 2015 and 2019, and L knee arthroscopy in 2012    PAIN:  NPRS:  0/10 lumbar and bilat knee pain     PRECAUTIONS: Other: multiple back surgeries, peripheral neuropathy, R shoulder RCR  WEIGHT BEARING RESTRICTIONS No  FALLS:  Has patient fallen in last 6 months? No  LIVING ENVIRONMENT: Lives with: lives with their spouse Lives in: 1 story home Stairs: 3-4 steps with rail Has following equipment at home: Single point cane, Environmental consultant - 2 wheeled, and Crutches  OCCUPATION: Pt works from home and sets his own hours.  PLOF: Independent; Pt began using SPC in early 2019.  Pt was able to perform daily mobility and daily activities with greater ease and less difficulty  PATIENT GOALS improve mobility, get rid of cane, improve strength legs and back.  Improve lumbar stiffness.  Improve performance of stairs   OBJECTIVE:   DIAGNOSTIC FINDINGS:  FINDINGS: Segmentation:  Standard.   Alignment:  Trace anterolisthesis L3 on L4.   Vertebrae: No fracture, evidence of discitis, or bone lesion. There is some degenerative endplate signal change at L3-4.   Conus medullaris and cauda equina: Conus extends to the T12 level. Conus and cauda equina appear normal.    Paraspinal and other soft tissues: Negative.   Disc levels:   T11-12 and T12-L1 are imaged in the sagittal plane only and negative.   L1-2: Minimal disc bulge without stenosis.   L2-3: Shallow disc bulge and mild ligamentum flavum thickening. Mild central canal and left foraminal narrowing appear unchanged. The right foramen is open.   L3-4: Status post left laminotomy. There has been progression of disease at this level. A new and very large central left paracentral disc protrusion extends into the left foramen. There is severe compression of the thecal sac and narrowing in the lateral recesses, worse on the left. Moderate bilateral foraminal narrowing is worse on the left.   L4-5: Status post left laminotomy as seen on the prior exam. There is a shallow broad-based disc bulge. The narrowing in the subarticular recesses is present which could impact either descending L5 root. Mild bilateral foraminal narrowing. No change.   L5-S1: A right subarticular recess protrusion impinging on the right S1 root appears unchanged. Foramina are open.   IMPRESSION: Since the prior MRI, the patient has developed a large central and left paracentral disc protrusion at L3-4 causing severe compression of the thecal sac and narrowing in the lateral recesses, worse on the left. Moderate bilateral foraminal narrowing at this level is worse on the left. Prior left laminotomy noted.   No  change in a broad-based disc bulge at L4-5 causing narrowing in the subarticular recesses which could impact either descending L5 root. Left laminotomy defect is seen at this level.   No change in a right subarticular recess protrusion at L5-S1. The disc appears to impinge on the right S1 root.     TODAY'S TREATMENT    Therapeutic Exercise: Nustep with LE's L4 x5 mins  Seated HS stretch 2 x 30 sec bilat Sidestepping with UE support on rail x 3 laps               Standing heel raises with TrA 2x10              Sit to stands with RTB around LE's x 7 and x 5 reps  PT reviewed and updated HEP.  Pt received a HEP handout and was educated in correct form and appropriate frequency.  Pt instructed he should not have pain with HEP              Neuro Re-ed Activities:  (for improved core strength, lumbopelvic stabilization, and postural strength)             Standing on airex with FT 3x30 sec with SBA/CGA with min assist for 1 occasion    Marching on airex with UE support 2x10 with TrA            Standing rows with GTB with TrA 2x10             Standing shoulder extension with GTB with TrA 2x10     PATIENT EDUCATION:  Education details:   POC and scheduling appointments.  HEP and rationale of exercises.  PT answered pt's questions. Person educated: Patient Education method: Explanation, Demonstration, Tactile cues, Verbal cues, and Handouts Education comprehension: verbalized understanding, returned demonstration, verbal cues required, tactile cues required, and needs further education   HOME EXERCISE PROGRAM: Access Code: [email or web address] URL: [email or web address] Date: 05/12/2022 Prepared by: Ronny Flurry  Updated HEP: - Heel Raises with Counter Support  - 1 x daily - 6-7 x weekly - 2 sets - 10 reps - Side Stepping with Counter Support  - 1 x daily - 5 x weekly - 2-3 sets - 10 reps - Seated Hamstring Stretch  - 1-2 x daily - 7 x weekly - 2 reps - 20-30 seconds  hold - Seated Shoulder Row with Anchored Resistance  - 1 x daily - 4 x weekly - 2-3 sets - 10 reps  ASSESSMENT:  CLINICAL IMPRESSION: Pt received cortisone shots in his knees from ortho MD which has reduced his knee pain.  Pt declined step ups today due to his knees.   Pt is limited with exercises due to knee issues.  PT reviewed and updated HEP today.  Pt received a HEP handout and demonstrated good understanding.  Pt performed standing rows though was a little unsteady.  PT instructed pt to not perform standing rows at  home and gave him seated rows instead.  Pt has difficulty with performing sit to stands.  PT raised table to an elevated height in order for pt to perform sit to stands.  He responded well to Rx having no pain after Rx.         OBJECTIVE IMPAIRMENTS Abnormal gait, decreased activity tolerance, decreased endurance, decreased mobility, difficulty walking, decreased strength, impaired flexibility, and pain.   ACTIVITY LIMITATIONS standing, squatting, stairs, transfers, bed mobility, and locomotion level  PARTICIPATION LIMITATIONS: meal prep, cleaning, and community activity  PERSONAL  FACTORS 3+ comorbidities: multiple back surgeries, OA, Bilat knee pain R > L, Peripheral neuropathy, Pseudogout, DM type II, and L knee scope   are also affecting patient's functional outcome.   REHAB POTENTIAL: Good  CLINICAL DECISION MAKING: Evolving/moderate complexity  EVALUATION COMPLEXITY: Moderate   GOALS:   SHORT TERM GOALS: Target date: 06/02/2022  Pt will be independent with HEP for improved pain, strength, and function.  Baseline: Goal status: Goal met  2.  Pt will tolerate aquatic therapy without adverse effects for improved strength and tolerance to activity.  Baseline:  Goal status: Discontinue.  Pt doesn't want to perform aquatic therapy. Target date: 05/26/2022  3.  Pt will demo improved gait speed and increased step length.  Baseline:  Goal status: goal met  4.  Pt will report at least a 25% improvement in pain and tolerance to daily mobility.  Baseline:  Goal status: Goal met    LONG TERM GOALS: Target date: 11/10/2022   Pt will be able to perform his daily transfers without difficultly.  Baseline:  Goal status: ongoing  2.  Pt will demo at least a 6-8 lb increase in bilat hip flexion and hip abd strength and L knee flexion to 5/5 MMT for improved performance of functional mobility skills including ambulation, stairs, and transfers.  Baseline:  Goal status:  progressing  3.  Pt will report improved standing tolerance with ADLs/IADLs including preparing food and drying off after a shower.   Baseline:  Goal status: Ongoing  4.  Pt will report improved tolerance with community ambulation and the ability to ambulate his normal distance without significant difficulty and pain.   Baseline:  Goal status: progressing  5.  Pt will increase his 6 MWT ambulation distance by 100 ft for improved tolerance with community ambulation.    Goal status:  INITIAL   PLAN: PT FREQUENCY: 1-2xwk  PT DURATION: 6 weeks  PLANNED INTERVENTIONS: Therapeutic exercises, Therapeutic activity, Neuromuscular re-education, Balance training, Gait training, Patient/Family education, Self Care, Joint mobilization, Stair training, Aquatic Therapy, Dry Needling, Electrical stimulation, Cryotherapy, Moist heat, Taping, Manual therapy, and Re-evaluation.  PLAN FOR NEXT SESSION:   Cont with land therapy.  Core, LE strengthening, and proprioceptive activities.    Selinda Michaels III PT, DPT 11/04/22 5:10 PM

## 2022-11-13 DIAGNOSIS — E1129 Type 2 diabetes mellitus with other diabetic kidney complication: Secondary | ICD-10-CM | POA: Diagnosis not present

## 2022-11-17 ENCOUNTER — Encounter (HOSPITAL_BASED_OUTPATIENT_CLINIC_OR_DEPARTMENT_OTHER): Payer: Medicare Other | Admitting: Physical Therapy

## 2022-11-24 ENCOUNTER — Encounter (HOSPITAL_BASED_OUTPATIENT_CLINIC_OR_DEPARTMENT_OTHER): Payer: Medicare Other | Admitting: Physical Therapy

## 2022-12-01 ENCOUNTER — Ambulatory Visit (HOSPITAL_BASED_OUTPATIENT_CLINIC_OR_DEPARTMENT_OTHER): Payer: Medicare Other | Attending: Internal Medicine | Admitting: Physical Therapy

## 2022-12-01 DIAGNOSIS — M6281 Muscle weakness (generalized): Secondary | ICD-10-CM | POA: Diagnosis not present

## 2022-12-01 DIAGNOSIS — M5459 Other low back pain: Secondary | ICD-10-CM | POA: Insufficient documentation

## 2022-12-01 DIAGNOSIS — R262 Difficulty in walking, not elsewhere classified: Secondary | ICD-10-CM | POA: Diagnosis not present

## 2022-12-01 NOTE — Therapy (Addendum)
OUTPATIENT PHYSICAL THERAPY  TREATMENT NOTE  / PROGRESS NOTE  Progress Note Reporting Period 09/29/22 to 12/01/22  See note below for Objective Data and Assessment of Progress/Goals.             Patient Name: Seth Young MRN: [id number] DOB:[birth year], 82 y.o., male Today's Date: 12/02/2022   PT End of Session - 12/02/22 2153     Visit Number 20    Number of Visits 26    Date for PT Re-Evaluation 01/26/23    Authorization Type [employer] MCR    PT Start Time 1318    PT Stop Time 1400    PT Time Calculation (min) 42 min    Activity Tolerance Patient tolerated treatment well    Behavior During Therapy WFL for tasks assessed/performed                       Past Medical History:  Diagnosis Date   Arthritis    "knees, toes, hands, probably in my back" (03/09/2018)   Ataxia    Basal cell carcinoma    left temporal area-no residual   Colon polyps 10-12-11   past hx.   Coronary artery disease    Exertional angina 06/26/2014   Cath with DES to the OM, Bioflow protocol    GERD (gastroesophageal reflux disease)    H/O hiatal hernia    no problems   High cholesterol    History of echocardiogram    Echo 7/19:  Mild LVH, mild focal basal septal hypertrophy, EF 60-65, no RWMA, Gr 1 DD, trivial AI, MAC   Hypertension    Ischemic chest pain (HCC) 08/07/2014   Occasional tremors    Bilateral hands   Peripheral neuropathy    Pneumonia 1940's X 2; ~ 2017   "infant; walking pneumonia" (03/09/2018)   Pseudogout    "it moves around"   Sleep apnea    no cpap used   Type II diabetes mellitus (HCC) dx'd 1982   nsulin started ~ 2000   Vertigo    Past Surgical History:  Procedure Laterality Date   BACK SURGERY     BASAL CELL CARCINOMA EXCISION Left ~ 2012   face   CARDIAC CATHETERIZATION  02/2005   Dr. Patty Sermons; negative.   CARDIAC CATHETERIZATION N/A 05/13/2016   Procedure: Left Heart Cath and Coronary Angiography;  Surgeon: Jake Bathe, MD;  Location: [facility];  Service: Cardiovascular;  Laterality: N/A;   CARPAL TUNNEL RELEASE Left 10/2009   CARPAL TUNNEL RELEASE Right ~ 2013   CATARACT EXTRACTION W/ INTRAOCULAR LENS IMPLANT Left 2000's   CATARACT EXTRACTION W/ INTRAOCULAR LENS IMPLANT Right 2015   COLONOSCOPY W/ POLYPECTOMY     CORONARY ANGIOPLASTY WITH STENT PLACEMENT  06/26/2014   "1"   CORONARY ANGIOPLASTY WITH STENT PLACEMENT  06/26/2014   pLAD 50%, d LAD 60%, oD1 80%,  mD1  70%, CFX 50%, OM 2 70%,  RCA 80%, PDA 95% (<74mm), OM1 99%-0% with Bio flow stent        CORONARY ANGIOPLASTY WITH STENT PLACEMENT  03/09/2018   CORONARY STENT INTERVENTION N/A 03/09/2018   Procedure: CORONARY STENT INTERVENTION;  Surgeon: Kathleene Hazel, MD;  Location: [facility];  Service: Cardiovascular;  Laterality: N/A;   ELBOW SURGERY Bilateral ~ 2014   "for blockages; Dr. Amanda Pea"   ENDARTERECTOMY Left 04/23/2015   Procedure: ENDARTERECTOMY CAROTID;  Surgeon: Fransisco Hertz, MD;  Location: [facility] OR;  Service: Vascular;  Laterality: Left;  EYE SURGERY Left    "related go diabetes; laser"   KNEE ARTHROSCOPY Left 05/2011   LEFT HEART CATH AND CORONARY ANGIOGRAPHY N/A 03/09/2018   Procedure: LEFT HEART CATH AND CORONARY ANGIOGRAPHY;  Surgeon: Kathleene Hazel, MD;  Location: [facility];  Service: Cardiovascular;  Laterality: N/A;   LEFT HEART CATHETERIZATION WITH CORONARY ANGIOGRAM N/A 06/26/2014   Procedure: LEFT HEART CATHETERIZATION WITH CORONARY ANGIOGRAM;  Surgeon: Micheline Chapman, MD;  Location: [facility] CATH LAB;  Service: Cardiovascular;  Laterality: N/A;   LEFT HEART CATHETERIZATION WITH CORONARY ANGIOGRAM N/A 08/07/2014   Procedure: LEFT HEART CATHETERIZATION WITH CORONARY ANGIOGRAM;  Surgeon: Micheline Chapman, MD;  Location: [facility] CATH LAB;  Service: Cardiovascular;  Laterality: N/A;   LUMBAR LAMINECTOMY/DECOMPRESSION MICRODISCECTOMY Left 11/02/2017   Procedure: MICRODISCECTOMY LUMBAR THREE- LUMBAR FOUR, LEFT;  Surgeon: Coletta Memos,  MD;  Location: [facility] OR;  Service: Neurosurgery;  Laterality: Left;   NASAL SEPTUM SURGERY  1979   PATCH ANGIOPLASTY Left 04/23/2015   Procedure: PATCH ANGIOPLASTY USING 1CM X 6CM XENOSURE BIOLOGIC PATCH;  Surgeon: Fransisco Hertz, MD;  Location: [facility] OR;  Service: Vascular;  Laterality: Left;   POSTERIOR LUMBAR FUSION  1964   SHOULDER OPEN ROTATOR CUFF REPAIR  10/14/2011   Procedure: ROTATOR CUFF REPAIR SHOULDER OPEN;  Surgeon: Javier Docker, MD;  Location: [facility] ORS;  Service: Orthopedics;  Laterality: Right;   SUBACROMIAL DECOMPRESSION  10/14/2011   Procedure: SUBACROMIAL DECOMPRESSION;  Surgeon: Javier Docker, MD;  Location: [facility] ORS;  Service: Orthopedics;  Laterality: Right;   TRIGGER FINGER RELEASE Right ~ 2013-2014   "3rd & 4th digits"   Patient Active Problem List   Diagnosis Date Noted   Coronary artery disease involving native coronary artery of native heart with angina pectoris (HCC) 03/07/2018   HNP (herniated nucleus pulposus), lumbar 11/02/2017   Carotid artery disease without cerebral infarction (HCC) 04/09/2015   Chest pain of uncertain etiology 06/21/2014   Diabetic neuropathy (HCC) 06/21/2014   Essential hypertension 06/21/2014   Hypercholesterolemia 06/21/2014   Rotator cuff tear 10/15/2011   IDDM (insulin dependent diabetes mellitus) 10/16/2008    PCP: Geoffry Paradise, MD  REFERRING PROVIDER: Geoffry Paradise, MD   REFERRING DIAG: M54.40 (ICD-10-CM) - Lumbago with sciatica, unspecified side   Rationale for Evaluation and Treatment Rehabilitation  THERAPY DIAG:  Muscle weakness (generalized)  Other low back pain  Difficulty in walking, not elsewhere classified  ONSET DATE: PT order 04/21/2022 / Surgery March 2022  SUBJECTIVE:  SUBJECTIVE STATEMENT: Pt reports his vertigo has flared up  the past 2 weeks and he has been constipated.  Pt states he hasn't done a whole lot due to those issues including his home exercises.  Pt denies any adverse effects after prior Rx.   Pt is frustrated about the scheduling.  He states he wants to come at 1:15 on Wednesdays but those times are already taken.  PT explained to pt about the scheduling process of here.  PT asked pt if he wanted to talk our supervisor about scheduling and he stated he does.    Pt states he was doing better when he had consistent therapy though has not been to therapy recently due to scheduling.  He cancelled his other appointments that were not on Wednesday at 1:15.    FUNCTIONAL IMPROVEMENTS:  Pt states he was doing better including with walking though not now.  Pt reports he was able to perform transfers well before the onset of vertigo.  He states he didn't have issues with his back and LE's with transfers.   FUNCTIONAL LIMITATIONS:  standing tolerance to prepare food and to dry off after shower, community ambulation.      PERTINENT HISTORY:  -3 back surgeries:  L3-5 laminotomy and pt thinks he has some screws in lumbar March 2022, Lumbar laminectomy/decompression/microdisctomy in 2019, Lumbar surgery in 1964  -OA, Bilat knee pain R > L, CAD, Peripheral neuropathy, Vertigo, Pseudogout, DM type II and diabetic retinopathy,   -Pt has Insulin monitor and tremors in bilat Ue's R > L  -PSHx:  R shoulder surgery in 2013 and was unable to repair rotator cuff, Coronary angioplasty with stent placement in 2015 and 2019, and L knee arthroscopy in 2012    PAIN:  NPRS:  0/10 current, 7/10 worst Location:  lumbar  Worst pain is standing in shower      PRECAUTIONS: Other: multiple back surgeries, peripheral neuropathy, R shoulder RCR  WEIGHT BEARING RESTRICTIONS No  FALLS:  Has patient fallen in last 6 months? No  LIVING ENVIRONMENT: Lives with: lives with their spouse Lives in: 1 story home Stairs: 3-4 steps with  rail Has following equipment at home: Single point cane, Environmental consultant - 2 wheeled, and Crutches  OCCUPATION: Pt works from home and sets his own hours.  PLOF: Independent; Pt began using SPC in early 2019.  Pt was able to perform daily mobility and daily activities with greater ease and less difficulty  PATIENT GOALS improve mobility, get rid of cane, improve strength legs and back.  Improve lumbar stiffness.  Improve performance of stairs   OBJECTIVE:   DIAGNOSTIC FINDINGS:  FINDINGS: Segmentation:  Standard.   Alignment:  Trace anterolisthesis L3 on L4.   Vertebrae: No fracture, evidence of discitis, or bone lesion. There is some degenerative endplate signal change at L3-4.   Conus medullaris and cauda equina: Conus extends to the T12 level. Conus and cauda equina appear normal.   Paraspinal and other soft tissues: Negative.   Disc levels:   T11-12 and T12-L1 are imaged in the sagittal plane only and negative.   L1-2: Minimal disc bulge without stenosis.   L2-3: Shallow disc bulge and mild ligamentum flavum thickening. Mild central canal and left foraminal narrowing appear unchanged. The right foramen is open.   L3-4: Status post left laminotomy. There has been progression of disease at this level. A new and very large central left paracentral disc protrusion extends into the left foramen. There is severe compression of the  thecal sac and narrowing in the lateral recesses, worse on the left. Moderate bilateral foraminal narrowing is worse on the left.   L4-5: Status post left laminotomy as seen on the prior exam. There is a shallow broad-based disc bulge. The narrowing in the subarticular recesses is present which could impact either descending L5 root. Mild bilateral foraminal narrowing. No change.   L5-S1: A right subarticular recess protrusion impinging on the right S1 root appears unchanged. Foramina are open.   IMPRESSION: Since the prior MRI, the patient has  developed a large central and left paracentral disc protrusion at L3-4 causing severe compression of the thecal sac and narrowing in the lateral recesses, worse on the left. Moderate bilateral foraminal narrowing at this level is worse on the left. Prior left laminotomy noted.   No change in a broad-based disc bulge at L4-5 causing narrowing in the subarticular recesses which could impact either descending L5 root. Left laminotomy defect is seen at this level.   No change in a right subarticular recess protrusion at L5-S1. The disc appears to impinge on the right S1 root.     TODAY'S TREATMENT   Reviewed pt presentation,current function, HEP compliance, and pain level. PT assessed goals and educated pt with progress.   PATIENT SURVEYS:  FOTO Initial/ Prior/ Current:  42 / 34 / 46.  Goal of 48 at visit #13   LOWER EXTREMITY MMT:     MMT and HHD Right eval Left eval Right 11/21 Left 11/21 Right 1/3 Left 1/3 Right 2/21 Left  2/21 Right 4/24 Left 4/24  Hip flexion 4+/5 ; 21.2 4/5 ; 19.8 4+/5 ; 26.2 4/5 ; 23.6 4+/5 ; 26.2  4+/5; 23.5 5/5; 30.8 4+/5; 25.6 40.8 29.4  Hip extension                    Hip abduction 22.0 21.8 21.9 23.8 25.1 29.1 27.0 29.3 35 40.8  Hip adduction                    Hip internal rotation                    Hip external rotation 4+/5 4+/5 4+/5 4+/5 4+/5 4+/5 4+/5 4+/5 4+/5 5/5  Knee flexion 5/5 tested in sitting 4/5 tested in sitting   4/5 tested in sitting 4/5 tested in sitting 4/5 tested in sitting 4+/5 tested in sitting 4/5 tested in sitting 5/5 tested in sitting 5/5 tested in sitting  Knee extension 5/5 5/5 5/5 5/5 5/5 5/5 5/5 5/5    Ankle dorsiflexion 5/5 5/5 5/5 5/5            Ankle plantarflexion Able to tolerate resistance though weak in sitting Able to tolerate resistance though weak in sitting Able to tolerate resistance though weak in sitting WFL            Ankle inversion                    Ankle eversion                     (Blank  rows = not tested)         GAIT: Assistive device utilized: Lofstrand crutch on R Level of assistance: Complete Independence Comments:   Pt ambulated with lofstrand crutch and demonstrates improved posture. Pt is slow with gait.  He has bilat toe out R > L.  6 MWT:  Pt ambulated 637 ft with SPC.  Pt had no LOB.  Pt stopped at 5 min and 8 sec.  Pt noticed stiffness in L posterior hip though no pain.       PATIENT EDUCATION:  Education details:  Goal progress, objective findings, HEP, POC, and rationale of exercises.  PT answered pt's questions. Person educated: Patient Education method: Explanation, Demonstration, Tactile cues, Verbal cues, and Handouts Education comprehension: verbalized understanding, returned demonstration, verbal cues required, tactile cues required, and needs further education   HOME EXERCISE PROGRAM: Access Code: [email or web address] URL: [email or web address] Date: 05/12/2022 Prepared by: Aaron Edelman  Exercises - Supine Transversus Abdominis Bracing - Hands on Stomach  - 2 x daily - 7 x weekly - 2 sets - 10 reps - 5 seconds hold - Supine March  - 1 x daily - 7 x weekly - 2 sets - 10 reps - Hooklying Clamshell with Resistance  - 1 x daily - 4-5 x weekly - 2 sets - 10 reps - Supine Active Straight Leg Raise  - 1 x daily - 5-6 x weekly - 2 sets - 10 reps - Clamshell  - 1 x daily - 5-6 x weekly - 2 sets - 10 reps  ASSESSMENT:  CLINICAL IMPRESSION: Pt has been absent from PT due to the schedule being filled at 1:15 on Wednesdays.  Pt states that is the time that he needs for PT.  PT and supervisor spoke to pt concerning the schedule and answered PT's questions.  Pt was still frustrated that he was unable to schedule 1:15 appointments on Wednesday because they were already taken.  Pt reports he was doing much better when he was able to consistently attend PT though has recently regressed due to not being at PT.  He has also had an issue with his vertigo  and GI system recently and has not performed his HEP due to vertigo and constipation.  Pt continues to be limited with standing activities including standing to dry off after the shower and standing to prepare food.  Pt is limited with ambulation distance.  He is doing well with transfers though vertigo has affected that recently.  Pt demonstrates a good improvement in bilat LE strength.  Pt had minimally increased ambulation distance on 6 MWT from 608 ft prior to 637 ft currently.  Pt met LTG #2.  Pt should benefit from cont skilled PT services to address LTG's and improve overall function.             OBJECTIVE IMPAIRMENTS Abnormal gait, decreased activity tolerance, decreased endurance, decreased mobility, difficulty walking, decreased strength, impaired flexibility, and pain.   ACTIVITY LIMITATIONS standing, squatting, stairs, transfers, bed mobility, and locomotion level  PARTICIPATION LIMITATIONS: meal prep, cleaning, and community activity  PERSONAL FACTORS 3+ comorbidities: multiple back surgeries, OA, Bilat knee pain R > L, Peripheral neuropathy, Pseudogout, DM type II, and L knee scope   are also affecting patient's functional outcome.   REHAB POTENTIAL: Good  CLINICAL DECISION MAKING: Evolving/moderate complexity  EVALUATION COMPLEXITY: Moderate   GOALS:   SHORT TERM GOALS: Target date: 06/02/2022  Pt will be independent with HEP for improved pain, strength, and function.  Baseline: Goal status: Goal met  2.  Pt will tolerate aquatic therapy without adverse effects for improved strength and tolerance to activity.  Baseline:  Goal status: Discontinue.  Pt doesn't want to perform aquatic therapy. Target date: 05/26/2022  3.  Pt will demo improved gait speed and increased step  length.  Baseline:  Goal status: goal met  4.  Pt will report at least a 25% improvement in pain and tolerance to daily mobility.  Baseline:  Goal status: Goal met    LONG TERM GOALS: Target  date: 01/26/2023   Pt will be able to perform his daily transfers without difficultly.  Baseline:  Goal status: ongoing  2.  Pt will demo at least a 6-8 lb increase in bilat hip flexion and hip abd strength and L knee flexion to 5/5 MMT for improved performance of functional mobility skills including ambulation, stairs, and transfers.  Baseline:  Goal status:  goal met  3.  Pt will report improved standing tolerance with ADLs/IADLs including preparing food and drying off after a shower.   Baseline:  Goal status: not met  4.  Pt will report improved tolerance with community ambulation and the ability to ambulate his normal distance without significant difficulty and pain.   Baseline:  Goal status: not met  5.  Pt will increase his 6 MWT ambulation distance by 100 ft for improved tolerance with community ambulation.    Goal status: progressing   PLAN: PT FREQUENCY: 1x/wk  PT DURATION: 6-8 weeks  PLANNED INTERVENTIONS: Therapeutic exercises, Therapeutic activity, Neuromuscular re-education, Balance training, Gait training, Patient/Family education, Self Care, Joint mobilization, Stair training, Aquatic Therapy, Dry Needling, Electrical stimulation, Cryotherapy, Moist heat, Taping, Manual therapy, and Re-evaluation.  PLAN FOR NEXT SESSION:   Cont with core and LE strengthening, ambulation tolerance, and proprioceptive activities.    Audie Clear III PT, DPT 12/02/22 10:17 PM

## 2022-12-02 ENCOUNTER — Encounter (HOSPITAL_BASED_OUTPATIENT_CLINIC_OR_DEPARTMENT_OTHER): Payer: Self-pay | Admitting: Physical Therapy

## 2022-12-08 ENCOUNTER — Ambulatory Visit (HOSPITAL_BASED_OUTPATIENT_CLINIC_OR_DEPARTMENT_OTHER): Payer: Medicare Other | Attending: Internal Medicine | Admitting: Physical Therapy

## 2022-12-08 DIAGNOSIS — R262 Difficulty in walking, not elsewhere classified: Secondary | ICD-10-CM | POA: Diagnosis not present

## 2022-12-08 DIAGNOSIS — M6281 Muscle weakness (generalized): Secondary | ICD-10-CM | POA: Insufficient documentation

## 2022-12-08 DIAGNOSIS — M5459 Other low back pain: Secondary | ICD-10-CM

## 2022-12-08 NOTE — Therapy (Signed)
OUTPATIENT PHYSICAL THERAPY  TREATMENT NOTE  / PROGRESS NOTE  Progress Note Reporting Period 09/29/22 to 12/01/22  See note below for Objective Data and Assessment of Progress/Goals.             Patient Name: Seth Young MRN: [id number] DOB:12/08/1940, 82 y.o., male Today's Date: 12/08/2022               Past Medical History:  Diagnosis Date   Arthritis    "knees, toes, hands, probably in my back" (03/09/2018)   Ataxia    Basal cell carcinoma    left temporal area-no residual   Colon polyps 10-12-11   past hx.   Coronary artery disease    Exertional angina 06/26/2014   Cath with DES to the OM, Bioflow protocol    GERD (gastroesophageal reflux disease)    H/O hiatal hernia    no problems   High cholesterol    History of echocardiogram    Echo 7/19:  Mild LVH, mild focal basal septal hypertrophy, EF 60-65, no RWMA, Gr 1 DD, trivial AI, MAC   Hypertension    Ischemic chest pain (HCC) 08/07/2014   Occasional tremors    Bilateral hands   Peripheral neuropathy    Pneumonia 1940's X 2; ~ 2017   "infant; walking pneumonia" (03/09/2018)   Pseudogout    "it moves around"   Sleep apnea    no cpap used   Type II diabetes mellitus (HCC) dx'd 1982   nsulin started ~ 2000   Vertigo    Past Surgical History:  Procedure Laterality Date   BACK SURGERY     BASAL CELL CARCINOMA EXCISION Left ~ 2012   face   CARDIAC CATHETERIZATION  02/2005   Dr. Patty Sermons; negative.   CARDIAC CATHETERIZATION N/A 05/13/2016   Procedure: Left Heart Cath and Coronary Angiography;  Surgeon: Jake Bathe, MD;  Location: [facility];  Service: Cardiovascular;  Laterality: N/A;   CARPAL TUNNEL RELEASE Left 10/2009   CARPAL TUNNEL RELEASE Right ~ 2013   CATARACT EXTRACTION W/ INTRAOCULAR LENS IMPLANT Left 2000's   CATARACT EXTRACTION W/ INTRAOCULAR LENS IMPLANT Right 2015   COLONOSCOPY W/ POLYPECTOMY     CORONARY ANGIOPLASTY WITH STENT PLACEMENT  06/26/2014   "1"   CORONARY  ANGIOPLASTY WITH STENT PLACEMENT  06/26/2014   pLAD 50%, d LAD 60%, oD1 80%,  mD1  70%, CFX 50%, OM 2 70%,  RCA 80%, PDA 95% (<21mm), OM1 99%-0% with Bio flow stent        CORONARY ANGIOPLASTY WITH STENT PLACEMENT  03/09/2018   CORONARY STENT INTERVENTION N/A 03/09/2018   Procedure: CORONARY STENT INTERVENTION;  Surgeon: Kathleene Hazel, MD;  Location: [facility];  Service: Cardiovascular;  Laterality: N/A;   ELBOW SURGERY Bilateral ~ 2014   "for blockages; Dr. Amanda Pea"   ENDARTERECTOMY Left 04/23/2015   Procedure: ENDARTERECTOMY CAROTID;  Surgeon: Fransisco Hertz, MD;  Location: [facility] OR;  Service: Vascular;  Laterality: Left;   EYE SURGERY Left    "related go diabetes; laser"   KNEE ARTHROSCOPY Left 05/2011   LEFT HEART CATH AND CORONARY ANGIOGRAPHY N/A 03/09/2018   Procedure: LEFT HEART CATH AND CORONARY ANGIOGRAPHY;  Surgeon: Kathleene Hazel, MD;  Location: [facility];  Service: Cardiovascular;  Laterality: N/A;   LEFT HEART CATHETERIZATION WITH CORONARY ANGIOGRAM N/A 06/26/2014   Procedure: LEFT HEART CATHETERIZATION WITH CORONARY ANGIOGRAM;  Surgeon: Micheline Chapman, MD;  Location: [facility] CATH LAB;  Service: Cardiovascular;  Laterality: N/A;   LEFT HEART CATHETERIZATION WITH CORONARY ANGIOGRAM N/A 08/07/2014   Procedure: LEFT HEART CATHETERIZATION WITH CORONARY ANGIOGRAM;  Surgeon: Micheline Chapman, MD;  Location: [facility] CATH LAB;  Service: Cardiovascular;  Laterality: N/A;   LUMBAR LAMINECTOMY/DECOMPRESSION MICRODISCECTOMY Left 11/02/2017   Procedure: MICRODISCECTOMY LUMBAR THREE- LUMBAR FOUR, LEFT;  Surgeon: Coletta Memos, MD;  Location: [facility] OR;  Service: Neurosurgery;  Laterality: Left;   NASAL SEPTUM SURGERY  1979   PATCH ANGIOPLASTY Left 04/23/2015   Procedure: PATCH ANGIOPLASTY USING 1CM X 6CM XENOSURE BIOLOGIC PATCH;  Surgeon: Fransisco Hertz, MD;  Location: [facility] OR;  Service: Vascular;  Laterality: Left;   POSTERIOR LUMBAR FUSION  1964   SHOULDER OPEN ROTATOR CUFF REPAIR   10/14/2011   Procedure: ROTATOR CUFF REPAIR SHOULDER OPEN;  Surgeon: Javier Docker, MD;  Location: [facility];  Service: Orthopedics;  Laterality: Right;   SUBACROMIAL DECOMPRESSION  10/14/2011   Procedure: SUBACROMIAL DECOMPRESSION;  Surgeon: Javier Docker, MD;  Location: [facility];  Service: Orthopedics;  Laterality: Right;   TRIGGER FINGER RELEASE Right ~ 2013-2014   "3rd & 4th digits"   Patient Active Problem List   Diagnosis Date Noted   Coronary artery disease involving native coronary artery of native heart with angina pectoris (HCC) 03/07/2018   HNP (herniated nucleus pulposus), lumbar 11/02/2017   Carotid artery disease without cerebral infarction (HCC) 04/09/2015   Chest pain of uncertain etiology 06/21/2014   Diabetic neuropathy (HCC) 06/21/2014   Essential hypertension 06/21/2014   Hypercholesterolemia 06/21/2014   Rotator cuff tear 10/15/2011   IDDM (insulin dependent diabetes mellitus) 10/16/2008    PCP: Geoffry Paradise, MD  REFERRING PROVIDER: Geoffry Paradise, MD   REFERRING DIAG: M54.40 (ICD-10-CM) - Lumbago with sciatica, unspecified side   Rationale for Evaluation and Treatment Rehabilitation  THERAPY DIAG:  No diagnosis found.  ONSET DATE: PT order 04/21/2022 / Surgery March 2022  SUBJECTIVE:                                                                                                                                                                                           SUBJECTIVE STATEMENT: Pt reports he has been off balanced lately though his vertigo has improved.  Pt reports he's going out of town and will have to perform stairs.  Pt states his pool opens on 5/16 and he will start swimming and doing some walking in the pool.  Pt has difficulty standing to dry off after shower.  He states he has weakness in his back and SOB towards the end of the shower.     FUNCTIONAL IMPROVEMENTS:  Pt states he was  doing better including with walking though not now.  Pt  reports he was able to perform transfers well before the onset of vertigo.  He states he didn't have issues with his back and LE's with transfers.   FUNCTIONAL LIMITATIONS:  standing tolerance to prepare food and to dry off after shower, community ambulation.      PERTINENT HISTORY:  -3 back surgeries:  L3-5 laminotomy and pt thinks he has some screws in lumbar March 2022, Lumbar laminectomy/decompression/microdisctomy in 2019, Lumbar surgery in 1964  -OA, Bilat knee pain R > L, CAD, Peripheral neuropathy, Vertigo, Pseudogout, DM type II and diabetic retinopathy,   -Pt has Insulin monitor and tremors in bilat Ue's R > L  -PSHx:  R shoulder surgery in 2013 and was unable to repair rotator cuff, Coronary angioplasty with stent placement in 2015 and 2019, and L knee arthroscopy in 2012    PAIN:  NPRS:  0/10 current, 7/10 worst Location:  lumbar  Worst pain is standing in shower      PRECAUTIONS: Other: multiple back surgeries, peripheral neuropathy, R shoulder RCR  WEIGHT BEARING RESTRICTIONS No  FALLS:  Has patient fallen in last 6 months? No  LIVING ENVIRONMENT: Lives with: lives with their spouse Lives in: 1 story home Stairs: 3-4 steps with rail Has following equipment at home: Single point cane, Environmental consultant - 2 wheeled, and Crutches  OCCUPATION: Pt works from home and sets his own hours.  PLOF: Independent; Pt began using SPC in early 2019.  Pt was able to perform daily mobility and daily activities with greater ease and less difficulty  PATIENT GOALS improve mobility, get rid of cane, improve strength legs and back.  Improve lumbar stiffness.  Improve performance of stairs   OBJECTIVE:   DIAGNOSTIC FINDINGS:  FINDINGS: Segmentation:  Standard.   Alignment:  Trace anterolisthesis L3 on L4.   Vertebrae: No fracture, evidence of discitis, or bone lesion. There is some degenerative endplate signal change at L3-4.   Conus medullaris and cauda equina: Conus extends to the  T12 level. Conus and cauda equina appear normal.   Paraspinal and other soft tissues: Negative.   Disc levels:   T11-12 and T12-L1 are imaged in the sagittal plane only and negative.   L1-2: Minimal disc bulge without stenosis.   L2-3: Shallow disc bulge and mild ligamentum flavum thickening. Mild central canal and left foraminal narrowing appear unchanged. The right foramen is open.   L3-4: Status post left laminotomy. There has been progression of disease at this level. A new and very large central left paracentral disc protrusion extends into the left foramen. There is severe compression of the thecal sac and narrowing in the lateral recesses, worse on the left. Moderate bilateral foraminal narrowing is worse on the left.   L4-5: Status post left laminotomy as seen on the prior exam. There is a shallow broad-based disc bulge. The narrowing in the subarticular recesses is present which could impact either descending L5 root. Mild bilateral foraminal narrowing. No change.   L5-S1: A right subarticular recess protrusion impinging on the right S1 root appears unchanged. Foramina are open.   IMPRESSION: Since the prior MRI, the patient has developed a large central and left paracentral disc protrusion at L3-4 causing severe compression of the thecal sac and narrowing in the lateral recesses, worse on the left. Moderate bilateral foraminal narrowing at this level is worse on the left. Prior left laminotomy noted.   No change in a broad-based disc bulge at  L4-5 causing narrowing in the subarticular recesses which could impact either descending L5 root. Left laminotomy defect is seen at this level.   No change in a right subarticular recess protrusion at L5-S1. The disc appears to impinge on the right S1 root.     TODAY'S TREATMENT   Reviewed pt presentation,current function, HEP compliance, and pain level. PT assessed goals and educated pt with progress.       GAIT: Assistive device utilized: Lofstrand crutch on R Level of assistance: Complete Independence Comments:   Pt ambulated with lofstrand crutch and demonstrates improved posture. Pt is slow with gait.  He has bilat toe out R > L.       Therapeutic Exercise: Nustep with UE/LE's L5 x 6 mins  Supine alt UE/LE with TrA Supine SLR with contralat UE ext with Tra x 10 reps without resistance and x 10 reps with 1#/2# UE/LE Supine clams with TrA 2x10 with BTB Seated HS stretch 2 x 30 sec bilat Sidestepping with UE support on rail x 2 laps               Standing heel raises with TrA 2x10             Sit to stands with GTB around LE's with table elevated with cuing to extend knees and stand up straighter.  Pt ambulated 238 ft with lofstrand crutch with cuing for upright posture and increased foot clearance.                Neuro Re-ed Activities:  (for improved core strength, lumbopelvic stabilization, and postural strength)             Standing on airex with FT 2x30 sec with SBA/CGA with min assist for 1 occasion              Marching on airex with UE support 2x10 with TrA             Standing rows with BTB with TrA 2x10             Standing shoulder extension with GTB with TrA 2x10     .       PATIENT EDUCATION:  Education details:  Goal progress, objective findings, HEP, POC, and rationale of exercises.  PT answered pt's questions. Person educated: Patient Education method: Explanation, Demonstration, Tactile cues, Verbal cues, and Handouts Education comprehension: verbalized understanding, returned demonstration, verbal cues required, tactile cues required, and needs further education   HOME EXERCISE PROGRAM: Access Code: [email or web address] URL: [email or web address] Date: 05/12/2022 Prepared by: Aaron Edelman  Exercises - Supine Transversus Abdominis Bracing - Hands on Stomach  - 2 x daily - 7 x weekly - 2 sets - 10 reps - 5 seconds hold - Supine March  - 1 x daily - 7 x  weekly - 2 sets - 10 reps - Hooklying Clamshell with Resistance  - 1 x daily - 4-5 x weekly - 2 sets - 10 reps - Supine Active Straight Leg Raise  - 1 x daily - 5-6 x weekly - 2 sets - 10 reps - Clamshell  - 1 x daily - 5-6 x weekly - 2 sets - 10 reps  ASSESSMENT:  CLINICAL IMPRESSION: Pt has been absent from PT due to the schedule being filled at 1:15 on Wednesdays.  Pt states that is the time that he needs for PT.  PT and supervisor spoke to pt concerning the schedule and answered PT's questions.  Pt was still frustrated that  he was unable to schedule 1:15 appointments on Wednesday because they were already taken.  Pt reports he was doing much better when he was able to consistently attend PT though has recently regressed due to not being at PT.  He has also had an issue with his vertigo and GI system recently and has not performed his HEP due to vertigo and constipation.  Pt continues to be limited with standing activities including standing to dry off after the shower and standing to prepare food.  Pt is limited with ambulation distance.  He is doing well with transfers though vertigo has affected that recently.  Pt demonstrates a good improvement in bilat LE strength.  Pt had minimally increased ambulation distance on 6 MWT from 608 ft prior to 637 ft currently.  Pt met LTG #2.  Pt should benefit from cont skilled PT services to address LTG's and improve overall function.             OBJECTIVE IMPAIRMENTS Abnormal gait, decreased activity tolerance, decreased endurance, decreased mobility, difficulty walking, decreased strength, impaired flexibility, and pain.   ACTIVITY LIMITATIONS standing, squatting, stairs, transfers, bed mobility, and locomotion level  PARTICIPATION LIMITATIONS: meal prep, cleaning, and community activity  PERSONAL FACTORS 3+ comorbidities: multiple back surgeries, OA, Bilat knee pain R > L, Peripheral neuropathy, Pseudogout, DM type II, and L knee scope   are also  affecting patient's functional outcome.   REHAB POTENTIAL: Good  CLINICAL DECISION MAKING: Evolving/moderate complexity  EVALUATION COMPLEXITY: Moderate   GOALS:   SHORT TERM GOALS: Target date: 06/02/2022  Pt will be independent with HEP for improved pain, strength, and function.  Baseline: Goal status: Goal met  2.  Pt will tolerate aquatic therapy without adverse effects for improved strength and tolerance to activity.  Baseline:  Goal status: Discontinue.  Pt doesn't want to perform aquatic therapy. Target date: 05/26/2022  3.  Pt will demo improved gait speed and increased step length.  Baseline:  Goal status: goal met  4.  Pt will report at least a 25% improvement in pain and tolerance to daily mobility.  Baseline:  Goal status: Goal met    LONG TERM GOALS: Target date: 01/26/2023   Pt will be able to perform his daily transfers without difficultly.  Baseline:  Goal status: ongoing  2.  Pt will demo at least a 6-8 lb increase in bilat hip flexion and hip abd strength and L knee flexion to 5/5 MMT for improved performance of functional mobility skills including ambulation, stairs, and transfers.  Baseline:  Goal status:  goal met  3.  Pt will report improved standing tolerance with ADLs/IADLs including preparing food and drying off after a shower.   Baseline:  Goal status: not met  4.  Pt will report improved tolerance with community ambulation and the ability to ambulate his normal distance without significant difficulty and pain.   Baseline:  Goal status: not met  5.  Pt will increase his 6 MWT ambulation distance by 100 ft for improved tolerance with community ambulation.    Goal status: progressing   PLAN: PT FREQUENCY: 1x/wk  PT DURATION: 6-8 weeks  PLANNED INTERVENTIONS: Therapeutic exercises, Therapeutic activity, Neuromuscular re-education, Balance training, Gait training, Patient/Family education, Self Care, Joint mobilization, Stair training,  Aquatic Therapy, Dry Needling, Electrical stimulation, Cryotherapy, Moist heat, Taping, Manual therapy, and Re-evaluation.  PLAN FOR NEXT SESSION:   Cont with core and LE strengthening, ambulation tolerance, and proprioceptive activities.    Audie Clear III  PT, DPT 12/08/22 1:23 PM

## 2022-12-13 DIAGNOSIS — E1129 Type 2 diabetes mellitus with other diabetic kidney complication: Secondary | ICD-10-CM | POA: Diagnosis not present

## 2022-12-15 DIAGNOSIS — Z125 Encounter for screening for malignant neoplasm of prostate: Secondary | ICD-10-CM | POA: Diagnosis not present

## 2022-12-15 DIAGNOSIS — E1129 Type 2 diabetes mellitus with other diabetic kidney complication: Secondary | ICD-10-CM | POA: Diagnosis not present

## 2022-12-15 DIAGNOSIS — R7989 Other specified abnormal findings of blood chemistry: Secondary | ICD-10-CM | POA: Diagnosis not present

## 2022-12-15 DIAGNOSIS — I10 Essential (primary) hypertension: Secondary | ICD-10-CM | POA: Diagnosis not present

## 2022-12-15 DIAGNOSIS — E782 Mixed hyperlipidemia: Secondary | ICD-10-CM | POA: Diagnosis not present

## 2022-12-15 DIAGNOSIS — K219 Gastro-esophageal reflux disease without esophagitis: Secondary | ICD-10-CM | POA: Diagnosis not present

## 2022-12-19 ENCOUNTER — Other Ambulatory Visit: Payer: Self-pay | Admitting: Cardiology

## 2022-12-19 DIAGNOSIS — R079 Chest pain, unspecified: Secondary | ICD-10-CM

## 2022-12-20 ENCOUNTER — Other Ambulatory Visit: Payer: Self-pay | Admitting: Cardiology

## 2022-12-22 DIAGNOSIS — Z1339 Encounter for screening examination for other mental health and behavioral disorders: Secondary | ICD-10-CM | POA: Diagnosis not present

## 2022-12-22 DIAGNOSIS — N182 Chronic kidney disease, stage 2 (mild): Secondary | ICD-10-CM | POA: Diagnosis not present

## 2022-12-22 DIAGNOSIS — R82998 Other abnormal findings in urine: Secondary | ICD-10-CM | POA: Diagnosis not present

## 2022-12-22 DIAGNOSIS — J449 Chronic obstructive pulmonary disease, unspecified: Secondary | ICD-10-CM | POA: Diagnosis not present

## 2022-12-22 DIAGNOSIS — E1129 Type 2 diabetes mellitus with other diabetic kidney complication: Secondary | ICD-10-CM | POA: Diagnosis not present

## 2022-12-22 DIAGNOSIS — Z1331 Encounter for screening for depression: Secondary | ICD-10-CM | POA: Diagnosis not present

## 2022-12-22 DIAGNOSIS — I129 Hypertensive chronic kidney disease with stage 1 through stage 4 chronic kidney disease, or unspecified chronic kidney disease: Secondary | ICD-10-CM | POA: Diagnosis not present

## 2022-12-22 DIAGNOSIS — Z0000 Encounter for general adult medical examination without abnormal findings: Secondary | ICD-10-CM | POA: Diagnosis not present

## 2022-12-29 ENCOUNTER — Ambulatory Visit (HOSPITAL_BASED_OUTPATIENT_CLINIC_OR_DEPARTMENT_OTHER): Payer: Medicare Other | Admitting: Physical Therapy

## 2022-12-29 DIAGNOSIS — M5459 Other low back pain: Secondary | ICD-10-CM | POA: Diagnosis not present

## 2022-12-29 DIAGNOSIS — M6281 Muscle weakness (generalized): Secondary | ICD-10-CM

## 2022-12-29 DIAGNOSIS — R262 Difficulty in walking, not elsewhere classified: Secondary | ICD-10-CM

## 2022-12-29 NOTE — Therapy (Signed)
OUTPATIENT PHYSICAL THERAPY  TREATMENT NOTE              Patient Name: Seth Young MRN: [id number] DOB:May 28, 1941, 82 y.o., male Today's Date: 12/09/2022   PT End of Session - 12/29/22      Visit Number 22    Number of Visits 26    Date for PT Re-Evaluation 01/26/23    Authorization Type [employer] MCR    PT Start Time 1318   PT Stop Time 1402    PT Time Calculation (min) 44 min    Activity Tolerance Patient tolerated treatment well    Behavior During Therapy WFL for tasks assessed/performed                        Past Medical History:  Diagnosis Date   Arthritis    "knees, toes, hands, probably in my back" (03/09/2018)   Ataxia    Basal cell carcinoma    left temporal area-no residual   Colon polyps 10-12-11   past hx.   Coronary artery disease    Exertional angina 06/26/2014   Cath with DES to the OM, Bioflow protocol    GERD (gastroesophageal reflux disease)    H/O hiatal hernia    no problems   High cholesterol    History of echocardiogram    Echo 7/19:  Mild LVH, mild focal basal septal hypertrophy, EF 60-65, no RWMA, Gr 1 DD, trivial AI, MAC   Hypertension    Ischemic chest pain (HCC) 08/07/2014   Occasional tremors    Bilateral hands   Peripheral neuropathy    Pneumonia 1940's X 2; ~ 2017   "infant; walking pneumonia" (03/09/2018)   Pseudogout    "it moves around"   Sleep apnea    no cpap used   Type II diabetes mellitus (HCC) dx'd 1982   nsulin started ~ 2000   Vertigo    Past Surgical History:  Procedure Laterality Date   BACK SURGERY     BASAL CELL CARCINOMA EXCISION Left ~ 2012   face   CARDIAC CATHETERIZATION  02/2005   Dr. Patty Sermons; negative.   CARDIAC CATHETERIZATION N/A 05/13/2016   Procedure: Left Heart Cath and Coronary Angiography;  Surgeon: Jake Bathe, MD;  Location: [facility];  Service: Cardiovascular;  Laterality: N/A;   CARPAL TUNNEL RELEASE Left 10/2009   CARPAL TUNNEL RELEASE Right ~ 2013   CATARACT  EXTRACTION W/ INTRAOCULAR LENS IMPLANT Left 2000's   CATARACT EXTRACTION W/ INTRAOCULAR LENS IMPLANT Right 2015   COLONOSCOPY W/ POLYPECTOMY     CORONARY ANGIOPLASTY WITH STENT PLACEMENT  06/26/2014   "1"   CORONARY ANGIOPLASTY WITH STENT PLACEMENT  06/26/2014   pLAD 50%, d LAD 60%, oD1 80%,  mD1  70%, CFX 50%, OM 2 70%,  RCA 80%, PDA 95% (<31mm), OM1 99%-0% with Bio flow stent        CORONARY ANGIOPLASTY WITH STENT PLACEMENT  03/09/2018   CORONARY STENT INTERVENTION N/A 03/09/2018   Procedure: CORONARY STENT INTERVENTION;  Surgeon: Kathleene Hazel, MD;  Location: [facility];  Service: Cardiovascular;  Laterality: N/A;   ELBOW SURGERY Bilateral ~ 2014   "for blockages; Dr. Amanda Pea"   ENDARTERECTOMY Left 04/23/2015   Procedure: ENDARTERECTOMY CAROTID;  Surgeon: Fransisco Hertz, MD;  Location: [facility] OR;  Service: Vascular;  Laterality: Left;   EYE SURGERY Left    "related go diabetes; laser"   KNEE ARTHROSCOPY Left 05/2011   LEFT HEART  CATH AND CORONARY ANGIOGRAPHY N/A 03/09/2018   Procedure: LEFT HEART CATH AND CORONARY ANGIOGRAPHY;  Surgeon: Kathleene Hazel, MD;  Location: [facility];  Service: Cardiovascular;  Laterality: N/A;   LEFT HEART CATHETERIZATION WITH CORONARY ANGIOGRAM N/A 06/26/2014   Procedure: LEFT HEART CATHETERIZATION WITH CORONARY ANGIOGRAM;  Surgeon: Micheline Chapman, MD;  Location: [facility] CATH LAB;  Service: Cardiovascular;  Laterality: N/A;   LEFT HEART CATHETERIZATION WITH CORONARY ANGIOGRAM N/A 08/07/2014   Procedure: LEFT HEART CATHETERIZATION WITH CORONARY ANGIOGRAM;  Surgeon: Micheline Chapman, MD;  Location: [facility] CATH LAB;  Service: Cardiovascular;  Laterality: N/A;   LUMBAR LAMINECTOMY/DECOMPRESSION MICRODISCECTOMY Left 11/02/2017   Procedure: MICRODISCECTOMY LUMBAR THREE- LUMBAR FOUR, LEFT;  Surgeon: Coletta Memos, MD;  Location: [facility] OR;  Service: Neurosurgery;  Laterality: Left;   NASAL SEPTUM SURGERY  1979   PATCH ANGIOPLASTY Left 04/23/2015    Procedure: PATCH ANGIOPLASTY USING 1CM X 6CM XENOSURE BIOLOGIC PATCH;  Surgeon: Fransisco Hertz, MD;  Location: [facility] OR;  Service: Vascular;  Laterality: Left;   POSTERIOR LUMBAR FUSION  1964   SHOULDER OPEN ROTATOR CUFF REPAIR  10/14/2011   Procedure: ROTATOR CUFF REPAIR SHOULDER OPEN;  Surgeon: Javier Docker, MD;  Location: [facility];  Service: Orthopedics;  Laterality: Right;   SUBACROMIAL DECOMPRESSION  10/14/2011   Procedure: SUBACROMIAL DECOMPRESSION;  Surgeon: Javier Docker, MD;  Location: [facility];  Service: Orthopedics;  Laterality: Right;   TRIGGER FINGER RELEASE Right ~ 2013-2014   "3rd & 4th digits"   Patient Active Problem List   Diagnosis Date Noted   Coronary artery disease involving native coronary artery of native heart with angina pectoris (HCC) 03/07/2018   HNP (herniated nucleus pulposus), lumbar 11/02/2017   Carotid artery disease without cerebral infarction (HCC) 04/09/2015   Chest pain of uncertain etiology 06/21/2014   Diabetic neuropathy (HCC) 06/21/2014   Essential hypertension 06/21/2014   Hypercholesterolemia 06/21/2014   Rotator cuff tear 10/15/2011   IDDM (insulin dependent diabetes mellitus) 10/16/2008    PCP: Geoffry Paradise, MD  REFERRING PROVIDER: Geoffry Paradise, MD   REFERRING DIAG: M54.40 (ICD-10-CM) - Lumbago with sciatica, unspecified side   Rationale for Evaluation and Treatment Rehabilitation  THERAPY DIAG:  Muscle weakness (generalized)  Other low back pain  Difficulty in walking, not elsewhere classified  ONSET DATE: PT order 04/21/2022 / Surgery March 2022  SUBJECTIVE:                                                                                                                                                                                           SUBJECTIVE STATEMENT: Pt states he actually crawled [place]  [place].  Pt states he noticed quad weakness bilat after his trip.  He also had vertigo for the week after his  trip.  Pt states he's had problems with his knees this week.  Pt reports improved tolerance with and stability with standing to dry off after shower.   Pt denies any adverse effects after prior Rx.     PERTINENT HISTORY:  -3 back surgeries:  L3-5 laminotomy and pt thinks he has some screws in lumbar March 2022, Lumbar laminectomy/decompression/microdisctomy in 2019, Lumbar surgery in 1964  -OA, Bilat knee pain R > L, CAD, Peripheral neuropathy, Vertigo, Pseudogout, DM type II and diabetic retinopathy,   -Pt has Insulin monitor and tremors in bilat Ue's R > L  -PSHx:  R shoulder surgery in 2013 and was unable to repair rotator cuff, Coronary angioplasty with stent placement in 2015 and 2019, and L knee arthroscopy in 2012    PAIN:  NPRS:  0/10 current, 7/10 worst Location:  lumbar  Worst pain is standing in shower      PRECAUTIONS: Other: multiple back surgeries, peripheral neuropathy, R shoulder RCR  WEIGHT BEARING RESTRICTIONS No  FALLS:  Has patient fallen in last 6 months? No  LIVING ENVIRONMENT: Lives with: lives with their spouse Lives in: 1 story home Stairs: 3-4 steps with rail Has following equipment at home: Single point cane, Environmental consultant - 2 wheeled, and Crutches  OCCUPATION: Pt works from home and sets his own hours.  PLOF: Independent; Pt began using SPC in early 2019.  Pt was able to perform daily mobility and daily activities with greater ease and less difficulty  PATIENT GOALS improve mobility, get rid of cane, improve strength legs and back.  Improve lumbar stiffness.  Improve performance of stairs   OBJECTIVE:   DIAGNOSTIC FINDINGS:  FINDINGS: Segmentation:  Standard.   Alignment:  Trace anterolisthesis L3 on L4.   Vertebrae: No fracture, evidence of discitis, or bone lesion. There is some degenerative endplate signal change at L3-4.   Conus medullaris and cauda equina: Conus extends to the T12 level. Conus and cauda equina appear normal.    Paraspinal and other soft tissues: Negative.   Disc levels:   T11-12 and T12-L1 are imaged in the sagittal plane only and negative.   L1-2: Minimal disc bulge without stenosis.   L2-3: Shallow disc bulge and mild ligamentum flavum thickening. Mild central canal and left foraminal narrowing appear unchanged. The right foramen is open.   L3-4: Status post left laminotomy. There has been progression of disease at this level. A new and very large central left paracentral disc protrusion extends into the left foramen. There is severe compression of the thecal sac and narrowing in the lateral recesses, worse on the left. Moderate bilateral foraminal narrowing is worse on the left.   L4-5: Status post left laminotomy as seen on the prior exam. There is a shallow broad-based disc bulge. The narrowing in the subarticular recesses is present which could impact either descending L5 root. Mild bilateral foraminal narrowing. No change.   L5-S1: A right subarticular recess protrusion impinging on the right S1 root appears unchanged. Foramina are open.   IMPRESSION: Since the prior MRI, the patient has developed a large central and left paracentral disc protrusion at L3-4 causing severe compression of the thecal sac and narrowing in the lateral recesses, worse on the left. Moderate bilateral foraminal narrowing at this level is worse on the left. Prior left laminotomy noted.  No change in a broad-based disc bulge at L4-5 causing narrowing in the subarticular recesses which could impact either descending L5 root. Left laminotomy defect is seen at this level.   No change in a right subarticular recess protrusion at L5-S1. The disc appears to impinge on the right S1 root.     TODAY'S TREATMENT        Therapeutic Exercise: Nustep with UE/LE's L5 x 5 mins    Supine SLR with contralat UE ext with Tra 2 x 10 reps with 1#/2# UE/LE   Sidestepping with UE support on rail x 2  laps    Therapeutic Activities:  Sit to stands with GTB around LE's with table elevated with cuing to extend knees and stand up straighter. 2X10, 1X7     Pt ambulated 535 ft with lofstrand crutch with cuing for upright posture and increased foot clearance.                Neuro Re-ed Activities:  (for improved core strength, lumbopelvic stabilization, and postural strength)             Marching on airex with UE support 3x10 with TrA             Standing rows with BTB with TrA 2x10             Standing shoulder extension with BTB with TrA 2x10           Standing on airex 3x30 sec without UE support with SBA to min assist        PATIENT EDUCATION:  Education details:  HEP, POC, and rationale of exercises.  PT answered pt's questions. Person educated: Patient Education method: Explanation, Demonstration, Tactile cues, Verbal cues Education comprehension: verbalized understanding, returned demonstration, verbal cues required, tactile cues required, and needs further education   HOME EXERCISE PROGRAM: Access Code: [email or web address] URL: [email or web address] Date: 05/12/2022 Prepared by: Aaron Edelman  Exercises - Supine Transversus Abdominis Bracing - Hands on Stomach  - 2 x daily - 7 x weekly - 2 sets - 10 reps - 5 seconds hold - Supine March  - 1 x daily - 7 x weekly - 2 sets - 10 reps - Hooklying Clamshell with Resistance  - 1 x daily - 4-5 x weekly - 2 sets - 10 reps - Supine Active Straight Leg Raise  - 1 x daily - 5-6 x weekly - 2 sets - 10 reps - Clamshell  - 1 x daily - 5-6 x weekly - 2 sets - 10 reps  ASSESSMENT:  CLINICAL IMPRESSION: Pt reports improved tolerance with and stability with standing to dry off after shower which has been one of his primary c/o's.  Pt demonstrated a good increase in ambulation distance today from 238 ft last Rx to 535 ft currently.  He continues to have difficulty with sit to stands requiring the table to be elevated and cuing to stand  up straight.  Pt responded well to Rx reporting no increased pain after Rx.           OBJECTIVE IMPAIRMENTS Abnormal gait, decreased activity tolerance, decreased endurance, decreased mobility, difficulty walking, decreased strength, impaired flexibility, and pain.   ACTIVITY LIMITATIONS standing, squatting, stairs, transfers, bed mobility, and locomotion level  PARTICIPATION LIMITATIONS: meal prep, cleaning, and community activity  PERSONAL FACTORS 3+ comorbidities: multiple back surgeries, OA, Bilat knee pain R > L, Peripheral neuropathy, Pseudogout, DM type II, and L knee scope   are also affecting patient's functional outcome.  REHAB POTENTIAL: Good  CLINICAL DECISION MAKING: Evolving/moderate complexity  EVALUATION COMPLEXITY: Moderate   GOALS:   SHORT TERM GOALS: Target date: 06/02/2022  Pt will be independent with HEP for improved pain, strength, and function.  Baseline: Goal status: Goal met  2.  Pt will tolerate aquatic therapy without adverse effects for improved strength and tolerance to activity.  Baseline:  Goal status: Discontinue.  Pt doesn't want to perform aquatic therapy. Target date: 05/26/2022  3.  Pt will demo improved gait speed and increased step length.  Baseline:  Goal status: goal met  4.  Pt will report at least a 25% improvement in pain and tolerance to daily mobility.  Baseline:  Goal status: Goal met    LONG TERM GOALS: Target date: 01/26/2023   Pt will be able to perform his daily transfers without difficultly.  Baseline:  Goal status: ongoing  2.  Pt will demo at least a 6-8 lb increase in bilat hip flexion and hip abd strength and L knee flexion to 5/5 MMT for improved performance of functional mobility skills including ambulation, stairs, and transfers.  Baseline:  Goal status:  goal met  3.  Pt will report improved standing tolerance with ADLs/IADLs including preparing food and drying off after a shower.   Baseline:  Goal  status: not met  4.  Pt will report improved tolerance with community ambulation and the ability to ambulate his normal distance without significant difficulty and pain.   Baseline:  Goal status: not met  5.  Pt will increase his 6 MWT ambulation distance by 100 ft for improved tolerance with community ambulation.    Goal status: progressing   PLAN: PT FREQUENCY: 1x/wk  PT DURATION: 6-8 weeks  PLANNED INTERVENTIONS: Therapeutic exercises, Therapeutic activity, Neuromuscular re-education, Balance training, Gait training, Patient/Family education, Self Care, Joint mobilization, Stair training, Aquatic Therapy, Dry Needling, Electrical stimulation, Cryotherapy, Moist heat, Taping, Manual therapy, and Re-evaluation.  PLAN FOR NEXT SESSION:   Cont with core and LE strengthening, ambulation tolerance, and proprioceptive activities.    Audie Clear III PT, DPT 12/30/22 11:53 PM

## 2022-12-30 ENCOUNTER — Telehealth: Payer: Self-pay | Admitting: Cardiology

## 2022-12-30 ENCOUNTER — Encounter (HOSPITAL_BASED_OUTPATIENT_CLINIC_OR_DEPARTMENT_OTHER): Payer: Self-pay | Admitting: Physical Therapy

## 2022-12-30 DIAGNOSIS — I779 Disorder of arteries and arterioles, unspecified: Secondary | ICD-10-CM

## 2022-12-30 NOTE — Telephone Encounter (Signed)
New Message:    Patient says he needs an Ultrasound of his Caroid arteries on both side. He would like for Dr Anne Fu to order this in early Septemberr please, so he can have the results when he see Dr Anne Fu on 04-28-23.

## 2023-01-04 NOTE — Telephone Encounter (Signed)
Seth Bathe, MD  You49 minutes ago (10:16 AM)    OK to order carotid Dopplers-carotid artery disease    Order placed for carotid doppler to be completed in September as requested.  Pt to be contacted to be scheduled.

## 2023-01-04 NOTE — Telephone Encounter (Signed)
Last carotid ordered by Dr Early  02/28/2018:  Indications:      Left carotid endarterectomy 04/23/2015.  Risk Factors:     Hypertension, hyperlipidemia, Diabetes, past history of                    smoking.  Comparison Study: No significant change when compared to previous exam on 02/23/2017.   Will have Dr Anne Fu to review to repeat is needed at this time.

## 2023-01-11 ENCOUNTER — Other Ambulatory Visit: Payer: Self-pay | Admitting: Cardiology

## 2023-01-11 DIAGNOSIS — R079 Chest pain, unspecified: Secondary | ICD-10-CM

## 2023-01-12 ENCOUNTER — Ambulatory Visit (HOSPITAL_BASED_OUTPATIENT_CLINIC_OR_DEPARTMENT_OTHER): Payer: Medicare Other | Attending: Internal Medicine | Admitting: Physical Therapy

## 2023-01-12 DIAGNOSIS — M6281 Muscle weakness (generalized): Secondary | ICD-10-CM | POA: Diagnosis not present

## 2023-01-12 DIAGNOSIS — M5459 Other low back pain: Secondary | ICD-10-CM | POA: Diagnosis not present

## 2023-01-12 DIAGNOSIS — R262 Difficulty in walking, not elsewhere classified: Secondary | ICD-10-CM | POA: Diagnosis not present

## 2023-01-12 NOTE — Therapy (Signed)
OUTPATIENT PHYSICAL THERAPY  TREATMENT NOTE              Patient Name: Seth Young MRN: [id number] DOB:[birth year], 82 y.o., male Today's Date: 01/13/2023  PT End of Session - 01/12/23        Visit Number 23     Number of Visits 26     Date for PT Re-Evaluation 01/26/23     Authorization Type [employer] MCR     PT Start Time 1315    PT Stop Time 1359     PT Time Calculation (min) 44 min     Activity Tolerance Patient tolerated treatment well     Behavior During Therapy WFL for tasks assessed/performed         Past Medical History:  Diagnosis Date   Arthritis    "knees, toes, hands, probably in my back" (03/09/2018)   Ataxia    Basal cell carcinoma    left temporal area-no residual   Colon polyps 10-12-11   past hx.   Coronary artery disease    Exertional angina 06/26/2014   Cath with DES to the OM, Bioflow protocol    GERD (gastroesophageal reflux disease)    H/O hiatal hernia    no problems   High cholesterol    History of echocardiogram    Echo 7/19:  Mild LVH, mild focal basal septal hypertrophy, EF 60-65, no RWMA, Gr 1 DD, trivial AI, MAC   Hypertension    Ischemic chest pain (HCC) 08/07/2014   Occasional tremors    Bilateral hands   Peripheral neuropathy    Pneumonia 1940's X 2; ~ 2017   "infant; walking pneumonia" (03/09/2018)   Pseudogout    "it moves around"   Sleep apnea    no cpap used   Type II diabetes mellitus (HCC) dx'd 1982   nsulin started ~ 2000   Vertigo    Past Surgical History:  Procedure Laterality Date   BACK SURGERY     BASAL CELL CARCINOMA EXCISION Left ~ 2012   face   CARDIAC CATHETERIZATION  02/2005   Dr. Patty Sermons; negative.   CARDIAC CATHETERIZATION N/A 05/13/2016   Procedure: Left Heart Cath and Coronary Angiography;  Surgeon: Jake Bathe, MD;  Location: [facility];  Service: Cardiovascular;  Laterality: N/A;   CARPAL TUNNEL RELEASE Left 10/2009   CARPAL TUNNEL RELEASE Right ~ 2013   CATARACT EXTRACTION W/  INTRAOCULAR LENS IMPLANT Left 2000's   CATARACT EXTRACTION W/ INTRAOCULAR LENS IMPLANT Right 2015   COLONOSCOPY W/ POLYPECTOMY     CORONARY ANGIOPLASTY WITH STENT PLACEMENT  06/26/2014   "1"   CORONARY ANGIOPLASTY WITH STENT PLACEMENT  06/26/2014   pLAD 50%, d LAD 60%, oD1 80%,  mD1  70%, CFX 50%, OM 2 70%,  RCA 80%, PDA 95% (<11mm), OM1 99%-0% with Bio flow stent        CORONARY ANGIOPLASTY WITH STENT PLACEMENT  03/09/2018   CORONARY STENT INTERVENTION N/A 03/09/2018   Procedure: CORONARY STENT INTERVENTION;  Surgeon: Kathleene Hazel, MD;  Location: [facility];  Service: Cardiovascular;  Laterality: N/A;   ELBOW SURGERY Bilateral ~ 2014   "for blockages; Dr. Amanda Pea"   ENDARTERECTOMY Left 04/23/2015   Procedure: ENDARTERECTOMY CAROTID;  Surgeon: Fransisco Hertz, MD;  Location: [facility] OR;  Service: Vascular;  Laterality: Left;   EYE SURGERY Left    "related go diabetes; laser"   KNEE ARTHROSCOPY Left 05/2011   LEFT HEART CATH AND CORONARY ANGIOGRAPHY N/A 03/09/2018  Procedure: LEFT HEART CATH AND CORONARY ANGIOGRAPHY;  Surgeon: Kathleene Hazel, MD;  Location: [facility];  Service: Cardiovascular;  Laterality: N/A;   LEFT HEART CATHETERIZATION WITH CORONARY ANGIOGRAM N/A 06/26/2014   Procedure: LEFT HEART CATHETERIZATION WITH CORONARY ANGIOGRAM;  Surgeon: Micheline Chapman, MD;  Location[facility];  Service: Cardiovascular;  Laterality: N/A;   LEFT HEART CATHETERIZATION WITH CORONARY ANGIOGRAM N/A 08/07/2014   Procedure: LEFT HEART CATHETERIZATION WITH CORONARY ANGIOGRAM;  Surgeon: Micheline Chapman, MD;  Location: [facility] CATH LAB;  Service: Cardiovascular;  Laterality: N/A;   LUMBAR LAMINECTOMY/DECOMPRESSION MICRODISCECTOMY Left 11/02/2017   Procedure: MICRODISCECTOMY LUMBAR THREE- LUMBAR FOUR, LEFT;  Surgeon: Coletta Memos, MD;  Location: [facility] OR;  Service: Neurosurgery;  Laterality: Left;   NASAL SEPTUM SURGERY  1979   PATCH ANGIOPLASTY Left 04/23/2015   Procedure: PATCH  ANGIOPLASTY USING 1CM X 6CM XENOSURE BIOLOGIC PATCH;  Surgeon: Fransisco Hertz, MD;  Location: [facility] OR;  Service: Vascular;  Laterality: Left;   POSTERIOR LUMBAR FUSION  1964   SHOULDER OPEN ROTATOR CUFF REPAIR  10/14/2011   Procedure: ROTATOR CUFF REPAIR SHOULDER OPEN;  Surgeon: Javier Docker, MD;  Location: [facility];  Service: Orthopedics;  Laterality: Right;   SUBACROMIAL DECOMPRESSION  10/14/2011   Procedure: SUBACROMIAL DECOMPRESSION;  Surgeon: Javier Docker, MD;  Location: [facility];  Service: Orthopedics;  Laterality: Right;   TRIGGER FINGER RELEASE Right ~ 2013-2014   "3rd & 4th digits"   Patient Active Problem List   Diagnosis Date Noted   Coronary artery disease involving native coronary artery of native heart with angina pectoris (HCC) 03/07/2018   HNP (herniated nucleus pulposus), lumbar 11/02/2017   Carotid artery disease without cerebral infarction (HCC) 04/09/2015   Chest pain of uncertain etiology 06/21/2014   Diabetic neuropathy (HCC) 06/21/2014   Essential hypertension 06/21/2014   Hypercholesterolemia 06/21/2014   Rotator cuff tear 10/15/2011   IDDM (insulin dependent diabetes mellitus) 10/16/2008    PCP: Geoffry Paradise, MD  REFERRING PROVIDER: Geoffry Paradise, MD   REFERRING DIAG: M54.40 (ICD-10-CM) - Lumbago with sciatica, unspecified side   Rationale for Evaluation and Treatment Rehabilitation  THERAPY DIAG:  Muscle weakness (generalized)  Other low back pain  Difficulty in walking, not elsewhere classified  ONSET DATE: PT order 04/21/2022 / Surgery March 2022  SUBJECTIVE:                                                                                                                                                                                           SUBJECTIVE STATEMENT: Pt states his neuropathy is bothering his L hand and R foot which has  bothered him the past few days.  Pt denies any adverse effects after prior Rx.  Pt reports he has weakness in bilat  quads.  Pt states he feels he makes progress and then something else happens.  Pt went to the pool and performed gait activities including fwd and bwd ambulation and sidestepped.  He also did some swimming.  Pt had increased swelling in R knee afterwards.  Pt reports he has performed some of his HEP.  Pt denies any adverse effects after prior Rx.     PERTINENT HISTORY:  -3 back surgeries:  L3-5 laminotomy and pt thinks he has some screws in lumbar March 2022, Lumbar laminectomy/decompression/microdisctomy in 2019, Lumbar surgery in 1964  -OA, Bilat knee pain R > L, CAD, Peripheral neuropathy, Vertigo, Pseudogout, DM type II and diabetic retinopathy,   -Pt has Insulin monitor and tremors in bilat Ue's R > L  -PSHx:  R shoulder surgery in 2013 and was unable to repair rotator cuff, Coronary angioplasty with stent placement in 2015 and 2019, and L knee arthroscopy in 2012    PAIN:  NPRS:  0/10 current, 7/10 worst Location:  lumbar  Worst pain is standing in shower  NPRS:   3/10 /  1/10 Location:   R / L     knee     PRECAUTIONS: Other: multiple back surgeries, peripheral neuropathy, R shoulder RCR  WEIGHT BEARING RESTRICTIONS No  FALLS:  Has patient fallen in last 6 months? No  LIVING ENVIRONMENT: Lives with: lives with their spouse Lives in: 1 story home Stairs: 3-4 steps with rail Has following equipment at home: Single point cane, Environmental consultant - 2 wheeled, and Crutches  OCCUPATION: Pt works from home and sets his own hours.  PLOF: Independent; Pt began using SPC in early 2019.  Pt was able to perform daily mobility and daily activities with greater ease and less difficulty  PATIENT GOALS improve mobility, get rid of cane, improve strength legs and back.  Improve lumbar stiffness.  Improve performance of stairs   OBJECTIVE:   DIAGNOSTIC FINDINGS:  FINDINGS: Segmentation:  Standard.   Alignment:  Trace anterolisthesis L3 on L4.   Vertebrae: No fracture, evidence of  discitis, or bone lesion. There is some degenerative endplate signal change at L3-4.   Conus medullaris and cauda equina: Conus extends to the T12 level. Conus and cauda equina appear normal.   Paraspinal and other soft tissues: Negative.   Disc levels:   T11-12 and T12-L1 are imaged in the sagittal plane only and negative.   L1-2: Minimal disc bulge without stenosis.   L2-3: Shallow disc bulge and mild ligamentum flavum thickening. Mild central canal and left foraminal narrowing appear unchanged. The right foramen is open.   L3-4: Status post left laminotomy. There has been progression of disease at this level. A new and very large central left paracentral disc protrusion extends into the left foramen. There is severe compression of the thecal sac and narrowing in the lateral recesses, worse on the left. Moderate bilateral foraminal narrowing is worse on the left.   L4-5: Status post left laminotomy as seen on the prior exam. There is a shallow broad-based disc bulge. The narrowing in the subarticular recesses is present which could impact either descending L5 root. Mild bilateral foraminal narrowing. No change.   L5-S1: A right subarticular recess protrusion impinging on the right S1 root appears unchanged. Foramina are open.   IMPRESSION: Since the prior MRI, the patient has developed a large central  and left paracentral disc protrusion at L3-4 causing severe compression of the thecal sac and narrowing in the lateral recesses, worse on the left. Moderate bilateral foraminal narrowing at this level is worse on the left. Prior left laminotomy noted.   No change in a broad-based disc bulge at L4-5 causing narrowing in the subarticular recesses which could impact either descending L5 root. Left laminotomy defect is seen at this level.   No change in a right subarticular recess protrusion at L5-S1. The disc appears to impinge on the right S1 root.     TODAY'S  TREATMENT        Therapeutic Exercise: Nustep with LE's L5 x 5 mins    Supine SLR with contralat UE ext with Tra 2 x 10 reps with 2#/3# UE/LE   Supine clams with BTB with TrA 3x10   Supine manual HS stretch 2x30 sec bilat   LAQ 3x10 with 3# bilat   Lateral band walks with RTB around thighs with UE support on rail x 3 laps    Therapeutic Activities:  Sit to stands with GTB around LE's with table elevated with cuing to extend knees and stand up straighter. 2x10, 1x7                   Neuro Re-ed Activities:  (for improved core strength, lumbopelvic stabilization, and postural strength)             Marching on airex with UE support 3x10 with TrA   Standing s/s weight shifts with occasional UE support with CGA/min assist                     Standing on airex x30 sec without UE support with CGA to min assist       PATIENT EDUCATION:  Education details:  HEP, POC, exercise form, and rationale of exercises.  PT answered pt's questions. Person educated: Patient Education method: Explanation, Demonstration, Tactile cues, Verbal cues Education comprehension: verbalized understanding, returned demonstration, verbal cues required, tactile cues required, and needs further education   HOME EXERCISE PROGRAM: Access Code: [email or web address] URL: [email or web address] Date: 05/12/2022 Prepared by: Aaron Edelman  Exercises - Supine Transversus Abdominis Bracing - Hands on Stomach  - 2 x daily - 7 x weekly - 2 sets - 10 reps - 5 seconds hold - Supine March  - 1 x daily - 7 x weekly - 2 sets - 10 reps - Hooklying Clamshell with Resistance  - 1 x daily - 4-5 x weekly - 2 sets - 10 reps - Supine Active Straight Leg Raise  - 1 x daily - 5-6 x weekly - 2 sets - 10 reps - Clamshell  - 1 x daily - 5-6 x weekly - 2 sets - 10 reps  ASSESSMENT:  CLINICAL IMPRESSION: Pt did perform some gait activities and swimming on his own in the pool recently.  He continues to have difficulty with sit to  stands requiring the table to be elevated and cuing to stand up straight.  Pt did stand up straighter with cuing.  Pt is challenged with standing activities on the airex.  He was able to perform sidestepping with theraband resistance well.  He responded well to Rx having no c/o's after Rx.          OBJECTIVE IMPAIRMENTS Abnormal gait, decreased activity tolerance, decreased endurance, decreased mobility, difficulty walking, decreased strength, impaired flexibility, and pain.   ACTIVITY LIMITATIONS standing, squatting, stairs, transfers, bed mobility, and locomotion level  PARTICIPATION LIMITATIONS: meal prep, cleaning, and community activity  PERSONAL FACTORS 3+ comorbidities: multiple back surgeries, OA, Bilat knee pain R > L, Peripheral neuropathy, Pseudogout, DM type II, and L knee scope   are also affecting patient's functional outcome.   REHAB POTENTIAL: Good  CLINICAL DECISION MAKING: Evolving/moderate complexity  EVALUATION COMPLEXITY: Moderate   GOALS:   SHORT TERM GOALS: Target date: 06/02/2022  Pt will be independent with HEP for improved pain, strength, and function.  Baseline: Goal status: Goal met  2.  Pt will tolerate aquatic therapy without adverse effects for improved strength and tolerance to activity.  Baseline:  Goal status: Discontinue.  Pt doesn't want to perform aquatic therapy. Target date: 05/26/2022  3.  Pt will demo improved gait speed and increased step length.  Baseline:  Goal status: goal met  4.  Pt will report at least a 25% improvement in pain and tolerance to daily mobility.  Baseline:  Goal status: Goal met    LONG TERM GOALS: Target date: 01/26/2023   Pt will be able to perform his daily transfers without difficultly.  Baseline:  Goal status: ongoing  2.  Pt will demo at least a 6-8 lb increase in bilat hip flexion and hip abd strength and L knee flexion to 5/5 MMT for improved performance of functional mobility skills including  ambulation, stairs, and transfers.  Baseline:  Goal status:  goal met  3.  Pt will report improved standing tolerance with ADLs/IADLs including preparing food and drying off after a shower.   Baseline:  Goal status: not met  4.  Pt will report improved tolerance with community ambulation and the ability to ambulate his normal distance without significant difficulty and pain.   Baseline:  Goal status: not met  5.  Pt will increase his 6 MWT ambulation distance by 100 ft for improved tolerance with community ambulation.    Goal status: progressing   PLAN: PT FREQUENCY: 1x/wk  PT DURATION: 6-8 weeks  PLANNED INTERVENTIONS: Therapeutic exercises, Therapeutic activity, Neuromuscular re-education, Balance training, Gait training, Patient/Family education, Self Care, Joint mobilization, Stair training, Aquatic Therapy, Dry Needling, Electrical stimulation, Cryotherapy, Moist heat, Taping, Manual therapy, and Re-evaluation.  PLAN FOR NEXT SESSION:   Cont with core and LE strengthening, ambulation tolerance, and proprioceptive activities.    Audie Clear III PT, DPT 01/13/23 11:29 PM

## 2023-01-13 ENCOUNTER — Encounter (HOSPITAL_BASED_OUTPATIENT_CLINIC_OR_DEPARTMENT_OTHER): Payer: Self-pay | Admitting: Physical Therapy

## 2023-01-13 DIAGNOSIS — E1129 Type 2 diabetes mellitus with other diabetic kidney complication: Secondary | ICD-10-CM | POA: Diagnosis not present

## 2023-01-19 ENCOUNTER — Ambulatory Visit (HOSPITAL_BASED_OUTPATIENT_CLINIC_OR_DEPARTMENT_OTHER): Payer: Medicare Other | Admitting: Physical Therapy

## 2023-01-19 DIAGNOSIS — M5459 Other low back pain: Secondary | ICD-10-CM | POA: Diagnosis not present

## 2023-01-19 DIAGNOSIS — M6281 Muscle weakness (generalized): Secondary | ICD-10-CM | POA: Diagnosis not present

## 2023-01-19 DIAGNOSIS — R262 Difficulty in walking, not elsewhere classified: Secondary | ICD-10-CM | POA: Diagnosis not present

## 2023-01-19 NOTE — Therapy (Signed)
OUTPATIENT PHYSICAL THERAPY  TREATMENT NOTE              Patient Name: Seth Young MRN: [id number] DOB:[birth year], 82 y.o., male Today's Date: 01/20/2023  PT End of Session - 01/19/23        Visit Number 24     Number of Visits 26     Date for PT Re-Evaluation 01/26/23     Authorization Type [employer] MCR     PT Start Time 1319    PT Stop Time 1403    PT Time Calculation (min) 44 min     Activity Tolerance Patient tolerated treatment well     Behavior During Therapy WFL for tasks assessed/performed         Past Medical History:  Diagnosis Date   Arthritis    "knees, toes, hands, probably in my back" (03/09/2018)   Ataxia    Basal cell carcinoma    left temporal area-no residual   Colon polyps 10-12-11   past hx.   Coronary artery disease    Exertional angina 06/26/2014   Cath with DES to the OM, Bioflow protocol    GERD (gastroesophageal reflux disease)    H/O hiatal hernia    no problems   High cholesterol    History of echocardiogram    Echo 7/19:  Mild LVH, mild focal basal septal hypertrophy, EF 60-65, no RWMA, Gr 1 DD, trivial AI, MAC   Hypertension    Ischemic chest pain (HCC) 08/07/2014   Occasional tremors    Bilateral hands   Peripheral neuropathy    Pneumonia 1940's X 2; ~ 2017   "infant; walking pneumonia" (03/09/2018)   Pseudogout    "it moves around"   Sleep apnea    no cpap used   Type II diabetes mellitus (HCC) dx'd 1982   nsulin started ~ 2000   Vertigo    Past Surgical History:  Procedure Laterality Date   BACK SURGERY     BASAL CELL CARCINOMA EXCISION Left ~ 2012   face   CARDIAC CATHETERIZATION  02/2005   Dr. Patty Sermons; negative.   CARDIAC CATHETERIZATION N/A 05/13/2016   Procedure: Left Heart Cath and Coronary Angiography;  Surgeon: Jake Bathe, MD;  Location: [facility];  Service: Cardiovascular;  Laterality: N/A;   CARPAL TUNNEL RELEASE Left 10/2009   CARPAL TUNNEL RELEASE Right ~ 2013   CATARACT EXTRACTION W/  INTRAOCULAR LENS IMPLANT Left 2000's   CATARACT EXTRACTION W/ INTRAOCULAR LENS IMPLANT Right 2015   COLONOSCOPY W/ POLYPECTOMY     CORONARY ANGIOPLASTY WITH STENT PLACEMENT  06/26/2014   "1"   CORONARY ANGIOPLASTY WITH STENT PLACEMENT  06/26/2014   pLAD 50%, d LAD 60%, oD1 80%,  mD1  70%, CFX 50%, OM 2 70%,  RCA 80%, PDA 95% (<84mm), OM1 99%-0% with Bio flow stent        CORONARY ANGIOPLASTY WITH STENT PLACEMENT  03/09/2018   CORONARY STENT INTERVENTION N/A 03/09/2018   Procedure: CORONARY STENT INTERVENTION;  Surgeon: Kathleene Hazel, MD;  Location: [facility];  Service: Cardiovascular;  Laterality: N/A;   ELBOW SURGERY Bilateral ~ 2014   "for blockages; Dr. Amanda Pea"   ENDARTERECTOMY Left 04/23/2015   Procedure: ENDARTERECTOMY CAROTID;  Surgeon: Fransisco Hertz, MD;  Location: [facility];  Service: Vascular;  Laterality: Left;   EYE SURGERY Left    "related go diabetes; laser"   KNEE ARTHROSCOPY Left 05/2011   LEFT HEART CATH AND CORONARY ANGIOGRAPHY N/A 03/09/2018  Procedure: LEFT HEART CATH AND CORONARY ANGIOGRAPHY;  Surgeon: Kathleene Hazel, MD;  Location: [facility];  Service: Cardiovascular;  Laterality: N/A;   LEFT HEART CATHETERIZATION WITH CORONARY ANGIOGRAM N/A 06/26/2014   Procedure: LEFT HEART CATHETERIZATION WITH CORONARY ANGIOGRAM;  Surgeon: Micheline Chapman, MD;  Location: [facility] CATH LAB;  Service: Cardiovascular;  Laterality: N/A;   LEFT HEART CATHETERIZATION WITH CORONARY ANGIOGRAM N/A 08/07/2014   Procedure: LEFT HEART CATHETERIZATION WITH CORONARY ANGIOGRAM;  Surgeon: Micheline Chapman, MD;  Location: [facility] CATH LAB;  Service: Cardiovascular;  Laterality: N/A;   LUMBAR LAMINECTOMY/DECOMPRESSION MICRODISCECTOMY Left 11/02/2017   Procedure: MICRODISCECTOMY LUMBAR THREE- LUMBAR FOUR, LEFT;  Surgeon: Coletta Memos, MD;  Location: [facility] OR;  Service: Neurosurgery;  Laterality: Left;   NASAL SEPTUM SURGERY  1979   PATCH ANGIOPLASTY Left 04/23/2015   Procedure: PATCH  ANGIOPLASTY USING 1CM X 6CM XENOSURE BIOLOGIC PATCH;  Surgeon: Fransisco Hertz, MD;  Location: [facility] OR;  Service: Vascular;  Laterality: Left;   POSTERIOR LUMBAR FUSION  1964   SHOULDER OPEN ROTATOR CUFF REPAIR  10/14/2011   Procedure: ROTATOR CUFF REPAIR SHOULDER OPEN;  Surgeon: Javier Docker, MD;  Location: [facility];  Service: Orthopedics;  Laterality: Right;   SUBACROMIAL DECOMPRESSION  10/14/2011   Procedure: SUBACROMIAL DECOMPRESSION;  Surgeon: Javier Docker, MD;  Location: [facility];  Service: Orthopedics;  Laterality: Right;   TRIGGER FINGER RELEASE Right ~ 2013-2014   "3rd & 4th digits"   Patient Active Problem List   Diagnosis Date Noted   Coronary artery disease involving native coronary artery of native heart with angina pectoris (HCC) 03/07/2018   HNP (herniated nucleus pulposus), lumbar 11/02/2017   Carotid artery disease without cerebral infarction (HCC) 04/09/2015   Chest pain of uncertain etiology 06/21/2014   Diabetic neuropathy (HCC) 06/21/2014   Essential hypertension 06/21/2014   Hypercholesterolemia 06/21/2014   Rotator cuff tear 10/15/2011   IDDM (insulin dependent diabetes mellitus) 10/16/2008    PCP: Geoffry Paradise, MD  REFERRING PROVIDER: Geoffry Paradise, MD   REFERRING DIAG: M54.40 (ICD-10-CM) - Lumbago with sciatica, unspecified side   Rationale for Evaluation and Treatment Rehabilitation  THERAPY DIAG:  Muscle weakness (generalized)  Other low back pain  Difficulty in walking, not elsewhere classified  ONSET DATE: PT order 04/21/2022 / Surgery March 2022  SUBJECTIVE:                                                                                                                                                                                           SUBJECTIVE STATEMENT: Pt went to his pool about 3 times since last Rx.  He did  some gait activities (walking fwd,bwd, and sidestepping) and gentle swimming (breaststroke).  He states his R knee had increased  swelling after swimming.  Pt states his L knee gave way when he was walking on Sunday though didn't fall.  Pt denies any adverse effects after prior Rx.  Pt states he has not been performing his HEP much.  Pt has been having some neuropathy in R hand and L foot.      PERTINENT HISTORY:  -3 back surgeries:  L3-5 laminotomy and pt thinks he has some screws in lumbar March 2022, Lumbar laminectomy/decompression/microdisctomy in 2019, Lumbar surgery in 1964  -OA, Bilat knee pain R > L, CAD, Peripheral neuropathy, Vertigo, Pseudogout, DM type II and diabetic retinopathy,   -Pt has Insulin monitor and tremors in bilat Ue's R > L  -PSHx:  R shoulder surgery in 2013 and was unable to repair rotator cuff, Coronary angioplasty with stent placement in 2015 and 2019, and L knee arthroscopy in 2012    PAIN:  NPRS:  0/10 current, 7/10 worst Location:  lumbar  Worst pain is standing in shower  NPRS:   3/10 /  1/10 Location:   R / L     knee     PRECAUTIONS: Other: multiple back surgeries, peripheral neuropathy, R shoulder RCR  WEIGHT BEARING RESTRICTIONS No  FALLS:  Has patient fallen in last 6 months? No  LIVING ENVIRONMENT: Lives with: lives with their spouse Lives in: 1 story home Stairs: 3-4 steps with rail Has following equipment at home: Single point cane, Environmental consultant - 2 wheeled, and Crutches  OCCUPATION: Pt works from home and sets his own hours.  PLOF: Independent; Pt began using SPC in early 2019.  Pt was able to perform daily mobility and daily activities with greater ease and less difficulty  PATIENT GOALS improve mobility, get rid of cane, improve strength legs and back.  Improve lumbar stiffness.  Improve performance of stairs   OBJECTIVE:   DIAGNOSTIC FINDINGS:  FINDINGS: Segmentation:  Standard.   Alignment:  Trace anterolisthesis L3 on L4.   Vertebrae: No fracture, evidence of discitis, or bone lesion. There is some degenerative endplate signal change at L3-4.    Conus medullaris and cauda equina: Conus extends to the T12 level. Conus and cauda equina appear normal.   Paraspinal and other soft tissues: Negative.   Disc levels:   T11-12 and T12-L1 are imaged in the sagittal plane only and negative.   L1-2: Minimal disc bulge without stenosis.   L2-3: Shallow disc bulge and mild ligamentum flavum thickening. Mild central canal and left foraminal narrowing appear unchanged. The right foramen is open.   L3-4: Status post left laminotomy. There has been progression of disease at this level. A new and very large central left paracentral disc protrusion extends into the left foramen. There is severe compression of the thecal sac and narrowing in the lateral recesses, worse on the left. Moderate bilateral foraminal narrowing is worse on the left.   L4-5: Status post left laminotomy as seen on the prior exam. There is a shallow broad-based disc bulge. The narrowing in the subarticular recesses is present which could impact either descending L5 root. Mild bilateral foraminal narrowing. No change.   L5-S1: A right subarticular recess protrusion impinging on the right S1 root appears unchanged. Foramina are open.   IMPRESSION: Since the prior MRI, the patient has developed a large central and left paracentral disc protrusion at L3-4 causing severe compression of the thecal sac and  narrowing in the lateral recesses, worse on the left. Moderate bilateral foraminal narrowing at this level is worse on the left. Prior left laminotomy noted.   No change in a broad-based disc bulge at L4-5 causing narrowing in the subarticular recesses which could impact either descending L5 root. Left laminotomy defect is seen at this level.   No change in a right subarticular recess protrusion at L5-S1. The disc appears to impinge on the right S1 root.     TODAY'S TREATMENT        Therapeutic Exercise: Nustep with UE/LE's L5 x 5 mins    Supine SLR with  contralat UE ext with Tra 2 x 10 reps with 2#/3# UE/LE  (except 1 set without weight in R UE due to shoulder pain)   LAQ 3x10 with 3# bilat   Lateral band walks with RTB around thighs with UE support on rail x 3 laps   Standing heel raises with TrA 3x10    Therapeutic Activities:  Sit to stands with GTB around LE's with table elevated 3x10   Ambulated bwds with 1 UE support on rail with SBA                Neuro Re-ed Activities:  (for improved core strength, lumbopelvic stabilization, and postural strength)             Marching on airex with UE support 3x10 with TrA   Standing on airex 3x30 sec without UE support with CGA to min assist   Standing with staggered stance on airex 2x30 sec bilat       PATIENT EDUCATION:  Education details:  PT educated pt in aquatic exercises. HEP, POC, exercise form, and rationale of exercises.  Encouraged pt to be compliant with HEP. Person educated: Patient Education method: Explanation, Demonstration, Tactile cues, Verbal cues Education comprehension: verbalized understanding, returned demonstration, verbal cues required, tactile cues required, and needs further education   HOME EXERCISE PROGRAM: Access Code: [email or web address] URL: [email or web address] Date: 05/12/2022 Prepared by: Aaron Edelman  Exercises - Supine Transversus Abdominis Bracing - Hands on Stomach  - 2 x daily - 7 x weekly - 2 sets - 10 reps - 5 seconds hold - Supine March  - 1 x daily - 7 x weekly - 2 sets - 10 reps - Hooklying Clamshell with Resistance  - 1 x daily - 4-5 x weekly - 2 sets - 10 reps - Supine Active Straight Leg Raise  - 1 x daily - 5-6 x weekly - 2 sets - 10 reps - Clamshell  - 1 x daily - 5-6 x weekly - 2 sets - 10 reps  ASSESSMENT:  CLINICAL IMPRESSION: Pt continues to have difficulty with sit to stands requiring the table to be elevated to perform, but demonstrates improvement.  He had improved form including standing up straighter with sit to  stands.  Pt has not been performing HEP much and PT encouraged pt to perform his home exercises.  He has done some swimming and walking activities in the pool.  Pt performed static balance exercises on airex pad well.  PT had pt perform bwd ambulation with 1 UE support and pt able to perform slowly without LOB.  He performed exercises well without c/o's.  Pt responded well to Rx having no c/o's after Rx.          OBJECTIVE IMPAIRMENTS Abnormal gait, decreased activity tolerance, decreased endurance, decreased mobility, difficulty walking, decreased strength, impaired flexibility, and pain.   ACTIVITY LIMITATIONS standing,  squatting, stairs, transfers, bed mobility, and locomotion level  PARTICIPATION LIMITATIONS: meal prep, cleaning, and community activity  PERSONAL FACTORS 3+ comorbidities: multiple back surgeries, OA, Bilat knee pain R > L, Peripheral neuropathy, Pseudogout, DM type II, and L knee scope   are also affecting patient's functional outcome.   REHAB POTENTIAL: Good  CLINICAL DECISION MAKING: Evolving/moderate complexity  EVALUATION COMPLEXITY: Moderate   GOALS:   SHORT TERM GOALS: Target date: 06/02/2022  Pt will be independent with HEP for improved pain, strength, and function.  Baseline: Goal status: Goal met  2.  Pt will tolerate aquatic therapy without adverse effects for improved strength and tolerance to activity.  Baseline:  Goal status: Discontinue.  Pt doesn't want to perform aquatic therapy. Target date: 05/26/2022  3.  Pt will demo improved gait speed and increased step length.  Baseline:  Goal status: goal met  4.  Pt will report at least a 25% improvement in pain and tolerance to daily mobility.  Baseline:  Goal status: Goal met    LONG TERM GOALS: Target date: 01/26/2023   Pt will be able to perform his daily transfers without difficultly.  Baseline:  Goal status: ongoing  2.  Pt will demo at least a 6-8 lb increase in bilat hip flexion  and hip abd strength and L knee flexion to 5/5 MMT for improved performance of functional mobility skills including ambulation, stairs, and transfers.  Baseline:  Goal status:  goal met  3.  Pt will report improved standing tolerance with ADLs/IADLs including preparing food and drying off after a shower.   Baseline:  Goal status: not met  4.  Pt will report improved tolerance with community ambulation and the ability to ambulate his normal distance without significant difficulty and pain.   Baseline:  Goal status: not met  5.  Pt will increase his 6 MWT ambulation distance by 100 ft for improved tolerance with community ambulation.    Goal status: progressing   PLAN: PT FREQUENCY: 1x/wk  PT DURATION: 6-8 weeks  PLANNED INTERVENTIONS: Therapeutic exercises, Therapeutic activity, Neuromuscular re-education, Balance training, Gait training, Patient/Family education, Self Care, Joint mobilization, Stair training, Aquatic Therapy, Dry Needling, Electrical stimulation, Cryotherapy, Moist heat, Taping, Manual therapy, and Re-evaluation.  PLAN FOR NEXT SESSION:   Cont with core and LE strengthening, ambulation tolerance, and proprioceptive activities.    Audie Clear III PT, DPT 01/20/23 11:14 AM

## 2023-01-20 ENCOUNTER — Encounter (HOSPITAL_BASED_OUTPATIENT_CLINIC_OR_DEPARTMENT_OTHER): Payer: Self-pay | Admitting: Physical Therapy

## 2023-02-02 ENCOUNTER — Ambulatory Visit (HOSPITAL_BASED_OUTPATIENT_CLINIC_OR_DEPARTMENT_OTHER): Payer: Medicare Other | Admitting: Physical Therapy

## 2023-02-02 DIAGNOSIS — M6281 Muscle weakness (generalized): Secondary | ICD-10-CM | POA: Diagnosis not present

## 2023-02-02 DIAGNOSIS — M5459 Other low back pain: Secondary | ICD-10-CM | POA: Diagnosis not present

## 2023-02-02 DIAGNOSIS — R262 Difficulty in walking, not elsewhere classified: Secondary | ICD-10-CM

## 2023-02-02 NOTE — Therapy (Signed)
OUTPATIENT PHYSICAL THERAPY  TREATMENT NOTE   Progress Note Reporting Period 12/08/2022 to 02/02/23  See note below for Objective Data and Assessment of Progress/Goals.                Patient Name: Seth Young MRN: [id number] DOB:Jan 12, 1941, 82 y.o., male Today's Date: 02/03/2023  PT End of Session - 02/02/23        Visit Number 25     Number of Visits 29     Date for PT Re-Evaluation 03/03/23     Authorization Type [employer] MCR     PT Start Time 1318    PT Stop Time 1403    PT Time Calculation (min) 45 min     Activity Tolerance Patient tolerated treatment well     Behavior During Therapy WFL for tasks assessed/performed         Past Medical History:  Diagnosis Date   Arthritis    "knees, toes, hands, probably in my back" (03/09/2018)   Ataxia    Basal cell carcinoma    left temporal area-no residual   Colon polyps 10-12-11   past hx.   Coronary artery disease    Exertional angina 06/26/2014   Cath with DES to the OM, Bioflow protocol    GERD (gastroesophageal reflux disease)    H/O hiatal hernia    no problems   High cholesterol    History of echocardiogram    Echo 7/19:  Mild LVH, mild focal basal septal hypertrophy, EF 60-65, no RWMA, Gr 1 DD, trivial AI, MAC   Hypertension    Ischemic chest pain (HCC) 08/07/2014   Occasional tremors    Bilateral hands   Peripheral neuropathy    Pneumonia 1940's X 2; ~ 2017   "infant; walking pneumonia" (03/09/2018)   Pseudogout    "it moves around"   Sleep apnea    no cpap used   Type II diabetes mellitus (HCC) dx'd 1982   nsulin started ~ 2000   Vertigo    Past Surgical History:  Procedure Laterality Date   BACK SURGERY     BASAL CELL CARCINOMA EXCISION Left ~ 2012   face   CARDIAC CATHETERIZATION  02/2005   Dr. Patty Sermons; negative.   CARDIAC CATHETERIZATION N/A 05/13/2016   Procedure: Left Heart Cath and Coronary Angiography;  Surgeon: Jake Bathe, MD;  Location: [facility];  Service:  Cardiovascular;  Laterality: N/A;   CARPAL TUNNEL RELEASE Left 10/2009   CARPAL TUNNEL RELEASE Right ~ 2013   CATARACT EXTRACTION W/ INTRAOCULAR LENS IMPLANT Left 2000's   CATARACT EXTRACTION W/ INTRAOCULAR LENS IMPLANT Right 2015   COLONOSCOPY W/ POLYPECTOMY     CORONARY ANGIOPLASTY WITH STENT PLACEMENT  06/26/2014   "1"   CORONARY ANGIOPLASTY WITH STENT PLACEMENT  06/26/2014   pLAD 50%, d LAD 60%, oD1 80%,  mD1  70%, CFX 50%, OM 2 70%,  RCA 80%, PDA 95% (<49mm), OM1 99%-0% with Bio flow stent        CORONARY ANGIOPLASTY WITH STENT PLACEMENT  03/09/2018   CORONARY STENT INTERVENTION N/A 03/09/2018   Procedure: CORONARY STENT INTERVENTION;  Surgeon: Kathleene Hazel, MD;  Location: [facility];  Service: Cardiovascular;  Laterality: N/A;   ELBOW SURGERY Bilateral ~ 2014   "for blockages; Dr. Amanda Pea"   ENDARTERECTOMY Left 04/23/2015   Procedure: ENDARTERECTOMY CAROTID;  Surgeon: Fransisco Hertz, MD;  Location: [facility] OR;  Service: Vascular;  Laterality: Left;   EYE SURGERY Left    "  related go diabetes; laser"   KNEE ARTHROSCOPY Left 05/2011   LEFT HEART CATH AND CORONARY ANGIOGRAPHY N/A 03/09/2018   Procedure: LEFT HEART CATH AND CORONARY ANGIOGRAPHY;  Surgeon: Kathleene Hazel, MD;  Location: [facility];  Service: Cardiovascular;  Laterality: N/A;   LEFT HEART CATHETERIZATION WITH CORONARY ANGIOGRAM N/A 06/26/2014   Procedure: LEFT HEART CATHETERIZATION WITH CORONARY ANGIOGRAM;  Surgeon: Micheline Chapman, MD;  Location: [facility] CATH LAB;  Service: Cardiovascular;  Laterality: N/A;   LEFT HEART CATHETERIZATION WITH CORONARY ANGIOGRAM N/A 08/07/2014   Procedure: LEFT HEART CATHETERIZATION WITH CORONARY ANGIOGRAM;  Surgeon: Micheline Chapman, MD;  Location: [facility] CATH LAB;  Service: Cardiovascular;  Laterality: N/A;   LUMBAR LAMINECTOMY/DECOMPRESSION MICRODISCECTOMY Left 11/02/2017   Procedure: MICRODISCECTOMY LUMBAR THREE- LUMBAR FOUR, LEFT;  Surgeon: Coletta Memos, MD;  Location: [facility]  OR;  Service: Neurosurgery;  Laterality: Left;   NASAL SEPTUM SURGERY  1979   PATCH ANGIOPLASTY Left 04/23/2015   Procedure: PATCH ANGIOPLASTY USING 1CM X 6CM XENOSURE BIOLOGIC PATCH;  Surgeon: Fransisco Hertz, MD;  Location: [facility] OR;  Service: Vascular;  Laterality: Left;   POSTERIOR LUMBAR FUSION  1964   SHOULDER OPEN ROTATOR CUFF REPAIR  10/14/2011   Procedure: ROTATOR CUFF REPAIR SHOULDER OPEN;  Surgeon: Javier Docker, MD;  Location: [facility] ORS;  Service: Orthopedics;  Laterality: Right;   SUBACROMIAL DECOMPRESSION  10/14/2011   Procedure: SUBACROMIAL DECOMPRESSION;  Surgeon: Javier Docker, MD;  Location: [facility] OR[facility];  Service: Orthopedics;  Laterality: Right;   TRIGGER FINGER RELEASE Right ~ 2013-2014   "3rd & 4th digits"   Patient Active Problem List   Diagnosis Date Noted   Coronary artery disease involving native coronary artery of native heart with angina pectoris (HCC) 03/07/2018   HNP (herniated nucleus pulposus), lumbar 11/02/2017   Carotid artery disease without cerebral infarction (HCC) 04/09/2015   Chest pain of uncertain etiology 06/21/2014   Diabetic neuropathy (HCC) 06/21/2014   Essential hypertension 06/21/2014   Hypercholesterolemia 06/21/2014   Rotator cuff tear 10/15/2011   IDDM (insulin dependent diabetes mellitus) 10/16/2008    PCP: Geoffry Paradise, MD  REFERRING PROVIDER: Geoffry Paradise, MD   REFERRING DIAG: M54.40 (ICD-10-CM) - Lumbago with sciatica, unspecified side   Rationale for Evaluation and Treatment Rehabilitation  THERAPY DIAG:  Muscle weakness (generalized)  Other low back pain  Difficulty in walking, not elsewhere classified  ONSET DATE: PT order 04/21/2022 / Surgery March 2022  SUBJECTIVE:  SUBJECTIVE STATEMENT: Pt states he was a little sore after prior Rx though  had no adverse effects.  Pt has been performing pool exercises and some swimming.  He states he is feeling better with the pool exercises.  Pt states his posture is improving.  Pt reports his L achilles has been bothering him for the past 4 days.  He had difficulty sleeping last night.  Yesterday his achilles pain was worse though not as bad today.  Pt has been performing more of his HEP though not all of the exercises.  Pt states his tolerance with community ambulation comes and goes.  Pt reports improved performance of sit to stand transfers.  Pt states he is more stable.  Pt's biggest c/o of standing to dry off after a shower is some days better and some days not.       PERTINENT HISTORY:  -3 back surgeries:  L3-5 laminotomy and pt thinks he has some screws in lumbar March 2022, Lumbar laminectomy/decompression/microdisctomy in 2019, Lumbar surgery in 1964  -OA, Bilat knee pain R > L, CAD, Peripheral neuropathy, Vertigo, Pseudogout, DM type II and diabetic retinopathy,   -Pt has Insulin monitor and tremors in bilat Ue's R > L  -PSHx:  R shoulder surgery in 2013 and was unable to repair rotator cuff, Coronary angioplasty with stent placement in 2015 and 2019, and L knee arthroscopy in 2012    PAIN:  NPRS:  1-2/10 current, 7/10 worst Location:  lumbar  Worst pain is standing in shower  NPRS:   6/10 Location:   L achilles     PRECAUTIONS: Other: multiple back surgeries, peripheral neuropathy, R shoulder RCR  WEIGHT BEARING RESTRICTIONS No  FALLS:  Has patient fallen in last 6 months? No  LIVING ENVIRONMENT: Lives with: lives with their spouse Lives in: 1 story home Stairs: 3-4 steps with rail Has following equipment at home: Single point cane, Environmental consultant - 2 wheeled, and Crutches  OCCUPATION: Pt works from home and sets his own hours.  PLOF: Independent; Pt began using SPC in early 2019.  Pt was able to perform daily mobility and daily activities with greater ease and less  difficulty  PATIENT GOALS improve mobility, get rid of cane, improve strength legs and back.  Improve lumbar stiffness.  Improve performance of stairs   OBJECTIVE:   DIAGNOSTIC FINDINGS:  FINDINGS: Segmentation:  Standard.   Alignment:  Trace anterolisthesis L3 on L4.   Vertebrae: No fracture, evidence of discitis, or bone lesion. There is some degenerative endplate signal change at L3-4.   Conus medullaris and cauda equina: Conus extends to the T12 level. Conus and cauda equina appear normal.   Paraspinal and other soft tissues: Negative.   Disc levels:   T11-12 and T12-L1 are imaged in the sagittal plane only and negative.   L1-2: Minimal disc bulge without stenosis.   L2-3: Shallow disc bulge and mild ligamentum flavum thickening. Mild central canal and left foraminal narrowing appear unchanged. The right foramen is open.   L3-4: Status post left laminotomy. There has been progression of disease at this level. A new and very large central left paracentral disc protrusion extends into the left foramen. There is severe compression of the thecal sac and narrowing in the lateral recesses, worse on the left. Moderate bilateral foraminal narrowing is worse on the left.   L4-5: Status post left laminotomy as seen on the prior exam. There is a shallow broad-based disc bulge. The narrowing in the subarticular recesses is  present which could impact either descending L5 root. Mild bilateral foraminal narrowing. No change.   L5-S1: A right subarticular recess protrusion impinging on the right S1 root appears unchanged. Foramina are open.   IMPRESSION: Since the prior MRI, the patient has developed a large central and left paracentral disc protrusion at L3-4 causing severe compression of the thecal sac and narrowing in the lateral recesses, worse on the left. Moderate bilateral foraminal narrowing at this level is worse on the left. Prior left laminotomy noted.   No change  in a broad-based disc bulge at L4-5 causing narrowing in the subarticular recesses which could impact either descending L5 root. Left laminotomy defect is seen at this level.   No change in a right subarticular recess protrusion at L5-S1. The disc appears to impinge on the right S1 root.     TODAY'S TREATMENT       Therapeutic Exercise:   LAQ 3x10 with 3# bilat   Lateral band walks with RTB around thighs with UE support on rail x 3 laps    6 MWT:  Prior/Current:  Pt ambulated 637 / 547 ft with SPC.  Pt had no LOB.  Pt stopped at 4 min and 30 sec.       Therapeutic Activities:  Sit to stands with GTB around LE's with table elevated 3x10   Ambulated bwds with 1 UE support on rail with SBA                Neuro Re-ed Activities:               Marching on airex with UE support 3x10 with TrA   Standing on airex 3x30 sec without UE support with CGA to min assist with gentle perturbations   Standing with staggered stance on airex 2x30 sec with L LE back and 1x30 sec with R LE back       PATIENT EDUCATION:  Education details:  PT educated pt in aquatic exercises.  6 MWT distance, HEP, POC, exercise form, and rationale of exercises.  Encouraged pt to be compliant with HEP. Person educated: Patient Education method: Explanation, Demonstration, Tactile cues, Verbal cues Education comprehension: verbalized understanding, returned demonstration, verbal cues required, tactile cues required, and needs further education   HOME EXERCISE PROGRAM: Access Code: [email or web address] URL: [email or web address] Date: 05/12/2022 Prepared by: Aaron Edelman  Exercises - Supine Transversus Abdominis Bracing - Hands on Stomach  - 2 x daily - 7 x weekly - 2 sets - 10 reps - 5 seconds hold - Supine March  - 1 x daily - 7 x weekly - 2 sets - 10 reps - Hooklying Clamshell with Resistance  - 1 x daily - 4-5 x weekly - 2 sets - 10 reps - Supine Active Straight Leg Raise  - 1 x daily - 5-6 x weekly -  2 sets - 10 reps - Clamshell  - 1 x daily - 5-6 x weekly - 2 sets - 10 reps  ASSESSMENT:  CLINICAL IMPRESSION: Pt is improving with function as evidenced by subjective reports of improved posture with gait, performance of sit to stand transfers, and being more stable.  Pt reports intermittent improvements with tolerance with ambulation and drying off after the shower though not consistent improvements.  Pt performed 6 MWT today though did worse today.  He was very fatigued with ambulation and ambulated less distance and less time on 6 MWT.  PT has encouraged pt to be compliant with HEP.  Pt is performing  HEP more frequently and is performing aquatic exercises.  He doesn't want to perform aquatic therapy due to not being able to dry off well in the facilities here at [facility] after the aquatic session.  Pt continues to have difficulty with sit to stands though is improving.  Pt has limited progress toward goals.  He may benefit from 3-4 more sessions to improve core and LE strength, performance of sit/stand transfers, increase tolerance with ambulation, and promote independence with HEP.            OBJECTIVE IMPAIRMENTS Abnormal gait, decreased activity tolerance, decreased endurance, decreased mobility, difficulty walking, decreased strength, impaired flexibility, and pain.   ACTIVITY LIMITATIONS standing, squatting, stairs, transfers, bed mobility, and locomotion level  PARTICIPATION LIMITATIONS: meal prep, cleaning, and community activity  PERSONAL FACTORS 3+ comorbidities: multiple back surgeries, OA, Bilat knee pain R > L, Peripheral neuropathy, Pseudogout, DM type II, and L knee scope   are also affecting patient's functional outcome.   REHAB POTENTIAL: Good  CLINICAL DECISION MAKING: Evolving/moderate complexity  EVALUATION COMPLEXITY: Moderate   GOALS:   SHORT TERM GOALS: Target date: 06/02/2022  Pt will be independent with HEP for improved pain, strength, and function.   Baseline: Goal status: Goal met  2.  Pt will tolerate aquatic therapy without adverse effects for improved strength and tolerance to activity.  Baseline:  Goal status: Discontinue.  Pt doesn't want to perform aquatic therapy. Target date: 05/26/2022  3.  Pt will demo improved gait speed and increased step length.  Baseline:  Goal status: goal met  4.  Pt will report at least a 25% improvement in pain and tolerance to daily mobility.  Baseline:  Goal status: Goal met    LONG TERM GOALS: Target date: 03/03/2023   Pt will be able to perform his daily transfers without difficultly.  Baseline:  Goal status: ongoing  2.  Pt will demo at least a 6-8 lb increase in bilat hip flexion and hip abd strength and L knee flexion to 5/5 MMT for improved performance of functional mobility skills including ambulation, stairs, and transfers.  Baseline:  Goal status:  goal met  3.  Pt will report improved standing tolerance with ADLs/IADLs including preparing food and drying off after a shower.   Baseline:  Goal status: not met  4.  Pt will report improved tolerance with community ambulation and the ability to ambulate his normal distance without significant difficulty and pain.   Baseline:  Goal status: not met  5.  Pt will increase his 6 MWT ambulation distance by 100 ft for improved tolerance with community ambulation.     Goal status: NOT MET   PLAN: PT FREQUENCY: 1x/wk  PT DURATION:  4 weeks  PLANNED INTERVENTIONS: Therapeutic exercises, Therapeutic activity, Neuromuscular re-education, Balance training, Gait training, Patient/Family education, Self Care, Joint mobilization, Stair training, Aquatic Therapy, Dry Needling, Electrical stimulation, Cryotherapy, Moist heat, Taping, Manual therapy, and Re-evaluation.  PLAN FOR NEXT SESSION:   Cont with core and LE strengthening, ambulation tolerance, and proprioceptive activities.    Audie Clear III PT, DPT 02/03/23 9:42  PM

## 2023-02-03 ENCOUNTER — Encounter (HOSPITAL_BASED_OUTPATIENT_CLINIC_OR_DEPARTMENT_OTHER): Payer: Self-pay | Admitting: Physical Therapy

## 2023-02-09 ENCOUNTER — Encounter (HOSPITAL_BASED_OUTPATIENT_CLINIC_OR_DEPARTMENT_OTHER): Payer: Medicare Other | Admitting: Physical Therapy

## 2023-02-12 DIAGNOSIS — E1129 Type 2 diabetes mellitus with other diabetic kidney complication: Secondary | ICD-10-CM | POA: Diagnosis not present

## 2023-02-16 ENCOUNTER — Ambulatory Visit (HOSPITAL_BASED_OUTPATIENT_CLINIC_OR_DEPARTMENT_OTHER): Payer: Medicare Other | Attending: Internal Medicine | Admitting: Physical Therapy

## 2023-02-16 ENCOUNTER — Encounter (HOSPITAL_BASED_OUTPATIENT_CLINIC_OR_DEPARTMENT_OTHER): Payer: Self-pay | Admitting: Physical Therapy

## 2023-02-16 DIAGNOSIS — R262 Difficulty in walking, not elsewhere classified: Secondary | ICD-10-CM

## 2023-02-16 DIAGNOSIS — M6281 Muscle weakness (generalized): Secondary | ICD-10-CM

## 2023-02-16 DIAGNOSIS — M5459 Other low back pain: Secondary | ICD-10-CM

## 2023-02-16 NOTE — Therapy (Signed)
OUTPATIENT PHYSICAL THERAPY  TREATMENT NOTE   Progress Note Reporting Period 12/08/2022 to 02/02/23  See note below for Objective Data and Assessment of Progress/Goals.                Patient Name: Seth Young MRN: [id number] DOB:[birth year], 82 y.o., male Today's Date: 02/16/2023  PT End of Session - 02/02/23        Visit Number 25     Number of Visits 29     Date for PT Re-Evaluation 03/03/23     Authorization Type [employer] MCR     PT Start Time 1318    PT Stop Time 1403    PT Time Calculation (min) 45 min     Activity Tolerance Patient tolerated treatment well     Behavior During Therapy WFL for tasks assessed/performed         Past Medical History:  Diagnosis Date   Arthritis    "knees, toes, hands, probably in my back" (03/09/2018)   Ataxia    Basal cell carcinoma    left temporal area-no residual   Colon polyps 10-12-11   past hx.   Coronary artery disease    Exertional angina 06/26/2014   Cath with DES to the OM, Bioflow protocol    GERD (gastroesophageal reflux disease)    H/O hiatal hernia    no problems   High cholesterol    History of echocardiogram    Echo 7/19:  Mild LVH, mild focal basal septal hypertrophy, EF 60-65, no RWMA, Gr 1 DD, trivial AI, MAC   Hypertension    Ischemic chest pain (HCC) 08/07/2014   Occasional tremors    Bilateral hands   Peripheral neuropathy    Pneumonia 1940's X 2; ~ 2017   "infant; walking pneumonia" (03/09/2018)   Pseudogout    "it moves around"   Sleep apnea    no cpap used   Type II diabetes mellitus (HCC) dx'd 1982   nsulin started ~ 2000   Vertigo    Past Surgical History:  Procedure Laterality Date   BACK SURGERY     BASAL CELL CARCINOMA EXCISION Left ~ 2012   face   CARDIAC CATHETERIZATION  02/2005   Dr. Patty Sermons; negative.   CARDIAC CATHETERIZATION N/A 05/13/2016   Procedure: Left Heart Cath and Coronary Angiography;  Surgeon: Jake Bathe, MD;  Location: [facility];  Service:  Cardiovascular;  Laterality: N/A;   CARPAL TUNNEL RELEASE Left 10/2009   CARPAL TUNNEL RELEASE Right ~ 2013   CATARACT EXTRACTION W/ INTRAOCULAR LENS IMPLANT Left 2000's   CATARACT EXTRACTION W/ INTRAOCULAR LENS IMPLANT Right 2015   COLONOSCOPY W/ POLYPECTOMY     CORONARY ANGIOPLASTY WITH STENT PLACEMENT  06/26/2014   "1"   CORONARY ANGIOPLASTY WITH STENT PLACEMENT  06/26/2014   pLAD 50%, d LAD 60%, oD1 80%,  mD1  70%, CFX 50%, OM 2 70%,  RCA 80%, PDA 95% (<54mm), OM1 99%-0% with Bio flow stent        CORONARY ANGIOPLASTY WITH STENT PLACEMENT  03/09/2018   CORONARY STENT INTERVENTION N/A 03/09/2018   Procedure: CORONARY STENT INTERVENTION;  Surgeon: Kathleene Hazel, MD;  Location: [facility];  Service: Cardiovascular;  Laterality: N/A;   ELBOW SURGERY Bilateral ~ 2014   "for blockages; Dr. Amanda Pea"   ENDARTERECTOMY Left 04/23/2015   Procedure: ENDARTERECTOMY CAROTID;  Surgeon: Fransisco Hertz, MD;  Location: [facility] OR;  Service: Vascular;  Laterality: Left;   EYE SURGERY Left    "  related go diabetes; laser"   KNEE ARTHROSCOPY Left 05/2011   LEFT HEART CATH AND CORONARY ANGIOGRAPHY N/A 03/09/2018   Procedure: LEFT HEART CATH AND CORONARY ANGIOGRAPHY;  Surgeon: Kathleene Hazel, MD;  Location: [facility];  Service: Cardiovascular;  Laterality: N/A;   LEFT HEART CATHETERIZATION WITH CORONARY ANGIOGRAM N/A 06/26/2014   Procedure: LEFT HEART CATHETERIZATION WITH CORONARY ANGIOGRAM;  Surgeon: Micheline Chapman, MD;  Location: [facility] CATH LAB;  Service: Cardiovascular;  Laterality: N/A;   LEFT HEART CATHETERIZATION WITH CORONARY ANGIOGRAM N/A 08/07/2014   Procedure: LEFT HEART CATHETERIZATION WITH CORONARY ANGIOGRAM;  Surgeon: Micheline Chapman, MD;  Location[facility];  Service: Cardiovascular;  Laterality: N/A;   LUMBAR LAMINECTOMY/DECOMPRESSION MICRODISCECTOMY Left 11/02/2017   Procedure: MICRODISCECTOMY LUMBAR THREE- LUMBAR FOUR, LEFT;  Surgeon: Coletta Memos, MD;  Location: [facility]  OR;  Service: Neurosurgery;  Laterality: Left;   NASAL SEPTUM SURGERY  1979   PATCH ANGIOPLASTY Left 04/23/2015   Procedure: PATCH ANGIOPLASTY USING 1CM X 6CM XENOSURE BIOLOGIC PATCH;  Surgeon: Fransisco Hertz, MD;  Location: [facility] OR;  Service: Vascular;  Laterality: Left;   POSTERIOR LUMBAR FUSION  1964   SHOULDER OPEN ROTATOR CUFF REPAIR  10/14/2011   Procedure: ROTATOR CUFF REPAIR SHOULDER OPEN;  Surgeon: Javier Docker, MD;  Location: [facility];  Service: Orthopedics;  Laterality: Right;   SUBACROMIAL DECOMPRESSION  10/14/2011   Procedure: SUBACROMIAL DECOMPRESSION;  Surgeon: Javier Docker, MD;  Location: [facility];  Service: Orthopedics;  Laterality: Right;   TRIGGER FINGER RELEASE Right ~ 2013-2014   "3rd & 4th digits"   Patient Active Problem List   Diagnosis Date Noted   Coronary artery disease involving native coronary artery of native heart with angina pectoris (HCC) 03/07/2018   HNP (herniated nucleus pulposus), lumbar 11/02/2017   Carotid artery disease without cerebral infarction (HCC) 04/09/2015   Chest pain of uncertain etiology 06/21/2014   Diabetic neuropathy (HCC) 06/21/2014   Essential hypertension 06/21/2014   Hypercholesterolemia 06/21/2014   Rotator cuff tear 10/15/2011   IDDM (insulin dependent diabetes mellitus) 10/16/2008    PCP: Geoffry Paradise, MD  REFERRING PROVIDER: Geoffry Paradise, MD   REFERRING DIAG: M54.40 (ICD-10-CM) - Lumbago with sciatica, unspecified side   Rationale for Evaluation and Treatment Rehabilitation  THERAPY DIAG:  No diagnosis found.  ONSET DATE: PT order 04/21/2022 / Surgery March 2022  SUBJECTIVE:                                                                                                                                                                                           SUBJECTIVE STATEMENT: Pt states he was  a little sore after prior Rx though had no adverse effects.  Pt has been performing pool exercises and some swimming.  He  states he is feeling better with the pool exercises.  Pt states his posture is improving.  Pt reports his L achilles has been bothering him for the past 4 days.  He had difficulty sleeping last night.  Yesterday his achilles pain was worse though not as bad today.  Pt has been performing more of his HEP though not all of the exercises.  Pt states his tolerance with community ambulation comes and goes.  Pt reports improved performance of sit to stand transfers.  Pt states he is more stable.  Pt's biggest c/o of standing to dry off after a shower is some days better and some days not.  Pt hasn't been in the pool lately due to the hot weather outside.  Pt performed a lot of stairs while at the beach and reports increased knee pain with the stairs.  He states his knees are bothering him still.  Pt reports MD increased his lyrica due to achilles pain and neuropathy.         PERTINENT HISTORY:  -3 back surgeries:  L3-5 laminotomy and pt thinks he has some screws in lumbar March 2022, Lumbar laminectomy/decompression/microdisctomy in 2019, Lumbar surgery in 1964  -OA, Bilat knee pain R > L, CAD, Peripheral neuropathy, Vertigo, Pseudogout, DM type II and diabetic retinopathy,   -Pt has Insulin monitor and tremors in bilat Ue's R > L  -PSHx:  R shoulder surgery in 2013 and was unable to repair rotator cuff, Coronary angioplasty with stent placement in 2015 and 2019, and L knee arthroscopy in 2012    PAIN:  NPRS:  1-2/10 current, 7/10 worst Location:  lumbar  Worst pain is standing in shower  NPRS:   1/10 Location:   L achilles  NPRS:  3/10 Location: bilat knees     PRECAUTIONS: Other: multiple back surgeries, peripheral neuropathy, R shoulder RCR  WEIGHT BEARING RESTRICTIONS No  FALLS:  Has patient fallen in last 6 months? No  LIVING ENVIRONMENT: Lives with: lives with their spouse Lives in: 1 story home Stairs: 3-4 steps with rail Has following equipment at home: Single point cane,  Environmental consultant - 2 wheeled, and Crutches  OCCUPATION: Pt works from home and sets his own hours.  PLOF: Independent; Pt began using SPC in early 2019.  Pt was able to perform daily mobility and daily activities with greater ease and less difficulty  PATIENT GOALS improve mobility, get rid of cane, improve strength legs and back.  Improve lumbar stiffness.  Improve performance of stairs   OBJECTIVE:   DIAGNOSTIC FINDINGS:  FINDINGS: Segmentation:  Standard.   Alignment:  Trace anterolisthesis L3 on L4.   Vertebrae: No fracture, evidence of discitis, or bone lesion. There is some degenerative endplate signal change at L3-4.   Conus medullaris and cauda equina: Conus extends to the T12 level. Conus and cauda equina appear normal.   Paraspinal and other soft tissues: Negative.   Disc levels:   T11-12 and T12-L1 are imaged in the sagittal plane only and negative.   L1-2: Minimal disc bulge without stenosis.   L2-3: Shallow disc bulge and mild ligamentum flavum thickening. Mild central canal and left foraminal narrowing appear unchanged. The right foramen is open.   L3-4: Status post left laminotomy. There has been progression of disease at this level. A new and very large central left paracentral disc protrusion extends  into the left foramen. There is severe compression of the thecal sac and narrowing in the lateral recesses, worse on the left. Moderate bilateral foraminal narrowing is worse on the left.   L4-5: Status post left laminotomy as seen on the prior exam. There is a shallow broad-based disc bulge. The narrowing in the subarticular recesses is present which could impact either descending L5 root. Mild bilateral foraminal narrowing. No change.   L5-S1: A right subarticular recess protrusion impinging on the right S1 root appears unchanged. Foramina are open.   IMPRESSION: Since the prior MRI, the patient has developed a large central and left paracentral disc  protrusion at L3-4 causing severe compression of the thecal sac and narrowing in the lateral recesses, worse on the left. Moderate bilateral foraminal narrowing at this level is worse on the left. Prior left laminotomy noted.   No change in a broad-based disc bulge at L4-5 causing narrowing in the subarticular recesses which could impact either descending L5 root. Left laminotomy defect is seen at this level.   No change in a right subarticular recess protrusion at L5-S1. The disc appears to impinge on the right S1 root.     TODAY'S TREATMENT       Therapeutic Exercise:   Nustep L4 x 5 mins, just LE's   LAQ 2x10 with 3# bilat   Lateral band walks with RTB around thighs with UE support on rail x 3 laps   Seated HS stretch 3x30 sec bilat      Therapeutic Activities:  Sit to stands with GTB around LE's with table elevated 3x10   Ambulated bwds with 1 UE support on rail with SBA   Pt ambulated 300 ft with loftstrand crutch with cuing to increase L arm swing                Neuro Re-ed Activities:               Marching on airex with UE support 3x10 with TrA   Standing on airex 3x30 sec without UE support with CGA to min assist with gentle perturbations   Standing with staggered stance on airex 2x20 sec bilat       PATIENT EDUCATION:  Education details:  PT educated pt in aquatic exercises.  HEP, POC, exercise form, and rationale of exercises.  Instructed pt to perform HEP. Person educated: Patient Education method: Explanation, Demonstration, Tactile cues, Verbal cues Education comprehension: verbalized understanding, returned demonstration, verbal cues required, tactile cues required, and needs further education   HOME EXERCISE PROGRAM: Access Code: [email or web address] URL: [email or web address] Date: 05/12/2022 Prepared by: Aaron Edelman  Exercises - Supine Transversus Abdominis Bracing - Hands on Stomach  - 2 x daily - 7 x weekly - 2 sets - 10 reps - 5 seconds  hold - Supine March  - 1 x daily - 7 x weekly - 2 sets - 10 reps - Hooklying Clamshell with Resistance  - 1 x daily - 4-5 x weekly - 2 sets - 10 reps - Supine Active Straight Leg Raise  - 1 x daily - 5-6 x weekly - 2 sets - 10 reps - Clamshell  - 1 x daily - 5-6 x weekly - 2 sets - 10 reps  ASSESSMENT:  CLINICAL IMPRESSION: Pt is improving with function as evidenced by subjective reports of improved posture with gait, performance of sit to stand transfers, and being more stable.  Pt reports intermittent improvements with tolerance with ambulation and drying off after the shower  though not consistent improvements.  Pt performed 6 MWT today though did worse today.  He was very fatigued with ambulation and ambulated less distance and less time on 6 MWT.  PT has encouraged pt to be compliant with HEP.  Pt is performing HEP more frequently and is performing aquatic exercises.  He doesn't want to perform aquatic therapy due to not being able to dry off well in the facilities [facility] at [facility] after the aquatic session.  Pt continues to have difficulty with sit to stands though is improving.  Pt has limited progress toward goals.  He may benefit from 3-4 more sessions to improve core and LE strength, performance of sit/stand transfers, increase tolerance with ambulation, and promote independence with HEP.    No increased pain         OBJECTIVE IMPAIRMENTS Abnormal gait, decreased activity tolerance, decreased endurance, decreased mobility, difficulty walking, decreased strength, impaired flexibility, and pain.   ACTIVITY LIMITATIONS standing, squatting, stairs, transfers, bed mobility, and locomotion level  PARTICIPATION LIMITATIONS: meal prep, cleaning, and community activity  PERSONAL FACTORS 3+ comorbidities: multiple back surgeries, OA, Bilat knee pain R > L, Peripheral neuropathy, Pseudogout, DM type II, and L knee scope   are also affecting patient's functional outcome.   REHAB POTENTIAL:  Good  CLINICAL DECISION MAKING: Evolving/moderate complexity  EVALUATION COMPLEXITY: Moderate   GOALS:   SHORT TERM GOALS: Target date: 06/02/2022  Pt will be independent with HEP for improved pain, strength, and function.  Baseline: Goal status: Goal met  2.  Pt will tolerate aquatic therapy without adverse effects for improved strength and tolerance to activity.  Baseline:  Goal status: Discontinue.  Pt doesn't want to perform aquatic therapy. Target date: 05/26/2022  3.  Pt will demo improved gait speed and increased step length.  Baseline:  Goal status: goal met  4.  Pt will report at least a 25% improvement in pain and tolerance to daily mobility.  Baseline:  Goal status: Goal met    LONG TERM GOALS: Target date: 03/03/2023   Pt will be able to perform his daily transfers without difficultly.  Baseline:  Goal status: ongoing  2.  Pt will demo at least a 6-8 lb increase in bilat hip flexion and hip abd strength and L knee flexion to 5/5 MMT for improved performance of functional mobility skills including ambulation, stairs, and transfers.  Baseline:  Goal status:  goal met  3.  Pt will report improved standing tolerance with ADLs/IADLs including preparing food and drying off after a shower.   Baseline:  Goal status: not met  4.  Pt will report improved tolerance with community ambulation and the ability to ambulate his normal distance without significant difficulty and pain.   Baseline:  Goal status: not met  5.  Pt will increase his 6 MWT ambulation distance by 100 ft for improved tolerance with community ambulation.     Goal status: NOT MET   PLAN: PT FREQUENCY: 1x/wk  PT DURATION:  4 weeks  PLANNED INTERVENTIONS: Therapeutic exercises, Therapeutic activity, Neuromuscular re-education, Balance training, Gait training, Patient/Family education, Self Care, Joint mobilization, Stair training, Aquatic Therapy, Dry Needling, Electrical stimulation,  Cryotherapy, Moist heat, Taping, Manual therapy, and Re-evaluation.  PLAN FOR NEXT SESSION:   Cont with core and LE strengthening, ambulation tolerance, and proprioceptive activities.    Audie Clear III PT, DPT 02/16/23 1:27 PM

## 2023-02-23 ENCOUNTER — Ambulatory Visit (HOSPITAL_BASED_OUTPATIENT_CLINIC_OR_DEPARTMENT_OTHER): Payer: Medicare Other | Admitting: Physical Therapy

## 2023-02-23 DIAGNOSIS — M6281 Muscle weakness (generalized): Secondary | ICD-10-CM | POA: Diagnosis not present

## 2023-02-23 DIAGNOSIS — R262 Difficulty in walking, not elsewhere classified: Secondary | ICD-10-CM | POA: Diagnosis not present

## 2023-02-23 DIAGNOSIS — M5459 Other low back pain: Secondary | ICD-10-CM | POA: Diagnosis not present

## 2023-02-23 NOTE — Therapy (Signed)
OUTPATIENT PHYSICAL THERAPY  TREATMENT NOTE        Patient Name: Seth Young MRN: [id number] DOB:01-22-1941, 82 y.o., male Today's Date: 02/24/2023  PT End of Session - 02/23/23        Visit Number 27     Number of Visits 29     Date for PT Re-Evaluation 03/03/23     Authorization Type [employer] MCR     PT Start Time 1322    PT Stop Time 1403    PT Time Calculation (min) 41 min     Activity Tolerance Patient tolerated treatment well     Behavior During Therapy WFL for tasks assessed/performed         Past Medical History:  Diagnosis Date   Arthritis    "knees, toes, hands, probably in my back" (03/09/2018)   Ataxia    Basal cell carcinoma    left temporal area-no residual   Colon polyps 10-12-11   past hx.   Coronary artery disease    Exertional angina 06/26/2014   Cath with DES to the OM, Bioflow protocol    GERD (gastroesophageal reflux disease)    H/O hiatal hernia    no problems   High cholesterol    History of echocardiogram    Echo 7/19:  Mild LVH, mild focal basal septal hypertrophy, EF 60-65, no RWMA, Gr 1 DD, trivial AI, MAC   Hypertension    Ischemic chest pain (HCC) 08/07/2014   Occasional tremors    Bilateral hands   Peripheral neuropathy    Pneumonia 1940's X 2; ~ 2017   "infant; walking pneumonia" (03/09/2018)   Pseudogout    "it moves around"   Sleep apnea    no cpap used   Type II diabetes mellitus (HCC) dx'd 1982   nsulin started ~ 2000   Vertigo    Past Surgical History:  Procedure Laterality Date   BACK SURGERY     BASAL CELL CARCINOMA EXCISION Left ~ 2012   face   CARDIAC CATHETERIZATION  02/2005   Dr. Patty Sermons; negative.   CARDIAC CATHETERIZATION N/A 05/13/2016   Procedure: Left Heart Cath and Coronary Angiography;  Surgeon: Jake Bathe, MD;  Location: [facility];  Service: Cardiovascular;  Laterality: N/A;   CARPAL TUNNEL RELEASE Left 10/2009   CARPAL TUNNEL RELEASE Right ~ 2013   CATARACT EXTRACTION W/ INTRAOCULAR LENS  IMPLANT Left 2000's   CATARACT EXTRACTION W/ INTRAOCULAR LENS IMPLANT Right 2015   COLONOSCOPY W/ POLYPECTOMY     CORONARY ANGIOPLASTY WITH STENT PLACEMENT  06/26/2014   "1"   CORONARY ANGIOPLASTY WITH STENT PLACEMENT  06/26/2014   pLAD 50%, d LAD 60%, oD1 80%,  mD1  70%, CFX 50%, OM 2 70%,  RCA 80%, PDA 95% (<84mm), OM1 99%-0% with Bio flow stent        CORONARY ANGIOPLASTY WITH STENT PLACEMENT  03/09/2018   CORONARY STENT INTERVENTION N/A 03/09/2018   Procedure: CORONARY STENT INTERVENTION;  Surgeon: Kathleene Hazel, MD;  Location: [facility];  Service: Cardiovascular;  Laterality: N/A;   ELBOW SURGERY Bilateral ~ 2014   "for blockages; Dr. Amanda Pea"   ENDARTERECTOMY Left 04/23/2015   Procedure: ENDARTERECTOMY CAROTID;  Surgeon: Fransisco Hertz, MD;  Location: [facility] OR;  Service: Vascular;  Laterality: Left;   EYE SURGERY Left    "related go diabetes; laser"   KNEE ARTHROSCOPY Left 05/2011   LEFT HEART CATH AND CORONARY ANGIOGRAPHY N/A 03/09/2018   Procedure: LEFT HEART CATH AND  CORONARY ANGIOGRAPHY;  Surgeon: Kathleene Hazel, MD;  Location: [facility];  Service: Cardiovascular;  Laterality: N/A;   LEFT HEART CATHETERIZATION WITH CORONARY ANGIOGRAM N/A 06/26/2014   Procedure: LEFT HEART CATHETERIZATION WITH CORONARY ANGIOGRAM;  Surgeon: Micheline Chapman, MD;  Location: [facility] CATH LAB;  Service: Cardiovascular;  Laterality: N/A;   LEFT HEART CATHETERIZATION WITH CORONARY ANGIOGRAM N/A 08/07/2014   Procedure: LEFT HEART CATHETERIZATION WITH CORONARY ANGIOGRAM;  Surgeon: Micheline Chapman, MD;  Location: [facility] CATH LAB;  Service: Cardiovascular;  Laterality: N/A;   LUMBAR LAMINECTOMY/DECOMPRESSION MICRODISCECTOMY Left 11/02/2017   Procedure: MICRODISCECTOMY LUMBAR THREE- LUMBAR FOUR, LEFT;  Surgeon: Coletta Memos, MD;  Location: [facility] OR;  Service: Neurosurgery;  Laterality: Left;   NASAL SEPTUM SURGERY  1979   PATCH ANGIOPLASTY Left 04/23/2015   Procedure: PATCH ANGIOPLASTY USING 1CM  X 6CM XENOSURE BIOLOGIC PATCH;  Surgeon: Fransisco Hertz, MD;  Location: [facility] OR;  Service: Vascular;  Laterality: Left;   POSTERIOR LUMBAR FUSION  1964   SHOULDER OPEN ROTATOR CUFF REPAIR  10/14/2011   Procedure: ROTATOR CUFF REPAIR SHOULDER OPEN;  Surgeon: Javier Docker, MD;  Location: [facility] OR[facility];  Service: Orthopedics;  Laterality: Right;   SUBACROMIAL DECOMPRESSION  10/14/2011   Procedure: SUBACROMIAL DECOMPRESSION;  Surgeon: Javier Docker, MD;  Location: [facility];  Service: Orthopedics;  Laterality: Right;   TRIGGER FINGER RELEASE Right ~ 2013-2014   "3rd & 4th digits"   Patient Active Problem List   Diagnosis Date Noted   Coronary artery disease involving native coronary artery of native heart with angina pectoris (HCC) 03/07/2018   HNP (herniated nucleus pulposus), lumbar 11/02/2017   Carotid artery disease without cerebral infarction (HCC) 04/09/2015   Chest pain of uncertain etiology 06/21/2014   Diabetic neuropathy (HCC) 06/21/2014   Essential hypertension 06/21/2014   Hypercholesterolemia 06/21/2014   Rotator cuff tear 10/15/2011   IDDM (insulin dependent diabetes mellitus) 10/16/2008    PCP: Geoffry Paradise, MD  REFERRING PROVIDER: Geoffry Paradise, MD   REFERRING DIAG: M54.40 (ICD-10-CM) - Lumbago with sciatica, unspecified side   Rationale for Evaluation and Treatment Rehabilitation  THERAPY DIAG:  Muscle weakness (generalized)  Other low back pain  Difficulty in walking, not elsewhere classified  ONSET DATE: PT order 04/21/2022 / Surgery March 2022  SUBJECTIVE:                                                                                                                                                                                           SUBJECTIVE STATEMENT: Pt hasn't been in the pool lately due to the hot weather outside.  Pt states his knees have  been bothering him.  Pt states he is going to make another appt with Dr. Steward Drone.  He took some hydrocodone before  Rx.  Pt reports neuropathy has been affecting his R hand > L hand.  He denies lumbar pain.  He states his back gets weak with standing activities.  Pt states the HS stretch he did last Rx really helped him.  He has been performing that stretch at home and it's helping.  Pt states some days he can get up and walk easier with improved posture and other days he can't.       PERTINENT HISTORY:  -3 back surgeries:  L3-5 laminotomy and pt thinks he has some screws in lumbar March 2022, Lumbar laminectomy/decompression/microdisctomy in 2019, Lumbar surgery in 1964  -OA, Bilat knee pain R > L, CAD, Peripheral neuropathy, Vertigo, Pseudogout, DM type II and diabetic retinopathy,   -Pt has Insulin monitor and tremors in bilat Ue's R > L  -PSHx:  R shoulder surgery in 2013 and was unable to repair rotator cuff, Coronary angioplasty with stent placement in 2015 and 2019, and L knee arthroscopy in 2012    PAIN:  NPRS:  0/10 current, 7/10 worst Location:  lumbar  Worst pain is standing in shower  NPRS:  3/10 Location: bilat knees     PRECAUTIONS: Other: multiple back surgeries, peripheral neuropathy, R shoulder RCR  WEIGHT BEARING RESTRICTIONS No  FALLS:  Has patient fallen in last 6 months? No  LIVING ENVIRONMENT: Lives with: lives with their spouse Lives in: 1 story home Stairs: 3-4 steps with rail Has following equipment at home: Single point cane, Environmental consultant - 2 wheeled, and Crutches  OCCUPATION: Pt works from home and sets his own hours.  PLOF: Independent; Pt began using SPC in early 2019.  Pt was able to perform daily mobility and daily activities with greater ease and less difficulty  PATIENT GOALS improve mobility, get rid of cane, improve strength legs and back.  Improve lumbar stiffness.  Improve performance of stairs   OBJECTIVE:   DIAGNOSTIC FINDINGS:  FINDINGS: Segmentation:  Standard.   Alignment:  Trace anterolisthesis L3 on L4.   Vertebrae: No fracture, evidence of  discitis, or bone lesion. There is some degenerative endplate signal change at L3-4.   Conus medullaris and cauda equina: Conus extends to the T12 level. Conus and cauda equina appear normal.   Paraspinal and other soft tissues: Negative.   Disc levels:   T11-12 and T12-L1 are imaged in the sagittal plane only and negative.   L1-2: Minimal disc bulge without stenosis.   L2-3: Shallow disc bulge and mild ligamentum flavum thickening. Mild central canal and left foraminal narrowing appear unchanged. The right foramen is open.   L3-4: Status post left laminotomy. There has been progression of disease at this level. A new and very large central left paracentral disc protrusion extends into the left foramen. There is severe compression of the thecal sac and narrowing in the lateral recesses, worse on the left. Moderate bilateral foraminal narrowing is worse on the left.   L4-5: Status post left laminotomy as seen on the prior exam. There is a shallow broad-based disc bulge. The narrowing in the subarticular recesses is present which could impact either descending L5 root. Mild bilateral foraminal narrowing. No change.   L5-S1: A right subarticular recess protrusion impinging on the right S1 root appears unchanged. Foramina are open.   IMPRESSION: Since the prior MRI, the patient has developed a large central and left  paracentral disc protrusion at L3-4 causing severe compression of the thecal sac and narrowing in the lateral recesses, worse on the left. Moderate bilateral foraminal narrowing at this level is worse on the left. Prior left laminotomy noted.   No change in a broad-based disc bulge at L4-5 causing narrowing in the subarticular recesses which could impact either descending L5 root. Left laminotomy defect is seen at this level.   No change in a right subarticular recess protrusion at L5-S1. The disc appears to impinge on the right S1 root.     TODAY'S  TREATMENT       Therapeutic Exercise:   Nustep L4 x 6 mins, just LE's   LAQ 2x10 with 3# bilat   Lateral band walks with RTB around thighs with UE support on rail x 3 laps   Seated HS stretch 3x30 sec bilat   Standing cable rows with scap retraction with 10# with CGA 2x10   Sit to stands with GTB around LE's with table elevated 2x10   Reviewed HEP                 Neuro Re-ed Activities:               Marching on airex with UE support 3x10 with TrA    Standing on airex 3x30 sec without UE support with perturbations with 1 occasion of UE support during each rep    Ambulated bwds with 1 UE support on rail with SBA x 3 laps          PATIENT EDUCATION:  Education details:  HEP, POC, exercise form, and rationale of exercises.  Instructed pt to perform HEP. Person educated: Patient Education method: Explanation, Demonstration, Tactile cues, Verbal cues Education comprehension: verbalized understanding, returned demonstration, verbal cues required, tactile cues required, and needs further education   HOME EXERCISE PROGRAM: Access Code: [email or web address] URL: [email or web address] Date: 05/12/2022 Prepared by: Aaron Edelman  Exercises - Supine Transversus Abdominis Bracing - Hands on Stomach  - 2 x daily - 7 x weekly - 2 sets - 10 reps - 5 seconds hold - Supine March  - 1 x daily - 7 x weekly - 2 sets - 10 reps - Hooklying Clamshell with Resistance  - 1 x daily - 4-5 x weekly - 2 sets - 10 reps - Supine Active Straight Leg Raise  - 1 x daily - 5-6 x weekly - 2 sets - 10 reps - Clamshell  - 1 x daily - 5-6 x weekly - 2 sets - 10 reps  ASSESSMENT:  CLINICAL IMPRESSION: Pt continues to have bilat knee pain.  He denies lumbar pain currently though is having 3/10 knee pain.  He reports being able to stand up and move with better posture at time though it's not consistent.  Pt continues to be limited with ambulation.  Pt gives good effort with all exercises and activities.  PT had  to increase height of table with sit to stands due to knee pain.  PT had pt perform standing cable rows today and he was a little unsteady requiring CGA.   Pt responded well to Rx having no increased pain after Rx.  Pt should be ready for discharge next Rx.    OBJECTIVE IMPAIRMENTS Abnormal gait, decreased activity tolerance, decreased endurance, decreased mobility, difficulty walking, decreased strength, impaired flexibility, and pain.   ACTIVITY LIMITATIONS standing, squatting, stairs, transfers, bed mobility, and locomotion level  PARTICIPATION LIMITATIONS: meal prep, cleaning, and community activity  PERSONAL FACTORS  3+ comorbidities: multiple back surgeries, OA, Bilat knee pain R > L, Peripheral neuropathy, Pseudogout, DM type II, and L knee scope   are also affecting patient's functional outcome.   REHAB POTENTIAL: Good  CLINICAL DECISION MAKING: Evolving/moderate complexity  EVALUATION COMPLEXITY: Moderate   GOALS:   SHORT TERM GOALS: Target date: 06/02/2022  Pt will be independent with HEP for improved pain, strength, and function.  Baseline: Goal status: Goal met  2.  Pt will tolerate aquatic therapy without adverse effects for improved strength and tolerance to activity.  Baseline:  Goal status: Discontinue.  Pt doesn't want to perform aquatic therapy. Target date: 05/26/2022  3.  Pt will demo improved gait speed and increased step length.  Baseline:  Goal status: goal met  4.  Pt will report at least a 25% improvement in pain and tolerance to daily mobility.  Baseline:  Goal status: Goal met    LONG TERM GOALS: Target date: 03/03/2023   Pt will be able to perform his daily transfers without difficultly.  Baseline:  Goal status: ongoing  2.  Pt will demo at least a 6-8 lb increase in bilat hip flexion and hip abd strength and L knee flexion to 5/5 MMT for improved performance of functional mobility skills including ambulation, stairs, and transfers.   Baseline:  Goal status:  goal met  3.  Pt will report improved standing tolerance with ADLs/IADLs including preparing food and drying off after a shower.   Baseline:  Goal status: not met  4.  Pt will report improved tolerance with community ambulation and the ability to ambulate his normal distance without significant difficulty and pain.   Baseline:  Goal status: not met  5.  Pt will increase his 6 MWT ambulation distance by 100 ft for improved tolerance with community ambulation.     Goal status: NOT MET   PLAN: PT FREQUENCY: 1x/wk  PT DURATION:  4 weeks  PLANNED INTERVENTIONS: Therapeutic exercises, Therapeutic activity, Neuromuscular re-education, Balance training, Gait training, Patient/Family education, Self Care, Joint mobilization, Stair training, Aquatic Therapy, Dry Needling, Electrical stimulation, Cryotherapy, Moist heat, Taping, Manual therapy, and Re-evaluation.  PLAN FOR NEXT SESSION:   Review HEP.  Discharge pt next visit.  Perform 6 MWT next visit.  Audie Clear III PT, DPT 02/24/23 9:37 PM

## 2023-02-24 ENCOUNTER — Encounter (HOSPITAL_BASED_OUTPATIENT_CLINIC_OR_DEPARTMENT_OTHER): Payer: Self-pay | Admitting: Physical Therapy

## 2023-03-02 ENCOUNTER — Ambulatory Visit (HOSPITAL_BASED_OUTPATIENT_CLINIC_OR_DEPARTMENT_OTHER): Payer: Medicare Other | Admitting: Physical Therapy

## 2023-03-02 DIAGNOSIS — M5459 Other low back pain: Secondary | ICD-10-CM

## 2023-03-02 DIAGNOSIS — M6281 Muscle weakness (generalized): Secondary | ICD-10-CM

## 2023-03-02 DIAGNOSIS — R262 Difficulty in walking, not elsewhere classified: Secondary | ICD-10-CM

## 2023-03-02 NOTE — Therapy (Incomplete)
OUTPATIENT PHYSICAL THERAPY  TREATMENT NOTE  / DISCHARGE SUMMARY      Patient Name: Seth Young MRN: [id number] DOB:23-Sep-1940, 82 y.o., male Today's Date: 03/03/2023  PT End of Session - 03/02/23        Visit Number 28     Number of Visits 28     Date for PT Re-Evaluation 03/03/23     Authorization Type [employer] MCR     PT Start Time 1322    PT Stop Time 1403    PT Time Calculation (min) 39 min     Activity Tolerance Patient tolerated treatment well     Behavior During Therapy WFL for tasks assessed/performed         Past Medical History:  Diagnosis Date   Arthritis    "knees, toes, hands, probably in my back" (03/09/2018)   Ataxia    Basal cell carcinoma    left temporal area-no residual   Colon polyps 10-12-11   past hx.   Coronary artery disease    Exertional angina 06/26/2014   Cath with DES to the OM, Bioflow protocol    GERD (gastroesophageal reflux disease)    H/O hiatal hernia    no problems   High cholesterol    History of echocardiogram    Echo 7/19:  Mild LVH, mild focal basal septal hypertrophy, EF 60-65, no RWMA, Gr 1 DD, trivial AI, MAC   Hypertension    Ischemic chest pain (HCC) 08/07/2014   Occasional tremors    Bilateral hands   Peripheral neuropathy    Pneumonia 1940's X 2; ~ 2017   "infant; walking pneumonia" (03/09/2018)   Pseudogout    "it moves around"   Sleep apnea    no cpap used   Type II diabetes mellitus (HCC) dx'd 1982   nsulin started ~ 2000   Vertigo    Past Surgical History:  Procedure Laterality Date   BACK SURGERY     BASAL CELL CARCINOMA EXCISION Left ~ 2012   face   CARDIAC CATHETERIZATION  02/2005   Dr. Patty Sermons; negative.   CARDIAC CATHETERIZATION N/A 05/13/2016   Procedure: Left Heart Cath and Coronary Angiography;  Surgeon: Jake Bathe, MD;  Location: [facility];  Service: Cardiovascular;  Laterality: N/A;   CARPAL TUNNEL RELEASE Left 10/2009   CARPAL TUNNEL RELEASE Right ~ 2013   CATARACT EXTRACTION W/  INTRAOCULAR LENS IMPLANT Left 2000's   CATARACT EXTRACTION W/ INTRAOCULAR LENS IMPLANT Right 2015   COLONOSCOPY W/ POLYPECTOMY     CORONARY ANGIOPLASTY WITH STENT PLACEMENT  06/26/2014   "1"   CORONARY ANGIOPLASTY WITH STENT PLACEMENT  06/26/2014   pLAD 50%, d LAD 60%, oD1 80%,  mD1  70%, CFX 50%, OM 2 70%,  RCA 80%, PDA 95% (<69mm), OM1 99%-0% with Bio flow stent        CORONARY ANGIOPLASTY WITH STENT PLACEMENT  03/09/2018   CORONARY STENT INTERVENTION N/A 03/09/2018   Procedure: CORONARY STENT INTERVENTION;  Surgeon: Kathleene Hazel, MD;  Location: [facility];  Service: Cardiovascular;  Laterality: N/A;   ELBOW SURGERY Bilateral ~ 2014   "for blockages; Dr. Amanda Pea"   ENDARTERECTOMY Left 04/23/2015   Procedure: ENDARTERECTOMY CAROTID;  Surgeon: Fransisco Hertz, MD;  Location: [facility] OR;  Service: Vascular;  Laterality: Left;   EYE SURGERY Left    "related go diabetes; laser"   KNEE ARTHROSCOPY Left 05/2011   LEFT HEART CATH AND CORONARY ANGIOGRAPHY N/A 03/09/2018   Procedure: LEFT HEART  CATH AND CORONARY ANGIOGRAPHY;  Surgeon: Kathleene Hazel, MD;  Location: [facility];  Service: Cardiovascular;  Laterality: N/A;   LEFT HEART CATHETERIZATION WITH CORONARY ANGIOGRAM N/A 06/26/2014   Procedure: LEFT HEART CATHETERIZATION WITH CORONARY ANGIOGRAM;  Surgeon: Micheline Chapman, MD;  Location: [facility] CATH LAB;  Service: Cardiovascular;  Laterality: N/A;   LEFT HEART CATHETERIZATION WITH CORONARY ANGIOGRAM N/A 08/07/2014   Procedure: LEFT HEART CATHETERIZATION WITH CORONARY ANGIOGRAM;  Surgeon: Micheline Chapman, MD;  Location: [facility] CATH LAB;  Service: Cardiovascular;  Laterality: N/A;   LUMBAR LAMINECTOMY/DECOMPRESSION MICRODISCECTOMY Left 11/02/2017   Procedure: MICRODISCECTOMY LUMBAR THREE- LUMBAR FOUR, LEFT;  Surgeon: Coletta Memos, MD;  Location: [facility] OR;  Service: Neurosurgery;  Laterality: Left;   NASAL SEPTUM SURGERY  1979   PATCH ANGIOPLASTY Left 04/23/2015   Procedure: PATCH  ANGIOPLASTY USING 1CM X 6CM XENOSURE BIOLOGIC PATCH;  Surgeon: Fransisco Hertz, MD;  Location: [facility] OR;  Service: Vascular;  Laterality: Left;   POSTERIOR LUMBAR FUSION  1964   SHOULDER OPEN ROTATOR CUFF REPAIR  10/14/2011   Procedure: ROTATOR CUFF REPAIR SHOULDER OPEN;  Surgeon: Javier Docker, MD;  Location: [facility];  Service: Orthopedics;  Laterality: Right;   SUBACROMIAL DECOMPRESSION  10/14/2011   Procedure: SUBACROMIAL DECOMPRESSION;  Surgeon: Javier Docker, MD;  Location: [facility];  Service: Orthopedics;  Laterality: Right;   TRIGGER FINGER RELEASE Right ~ 2013-2014   "3rd & 4th digits"   Patient Active Problem List   Diagnosis Date Noted   Coronary artery disease involving native coronary artery of native heart with angina pectoris (HCC) 03/07/2018   HNP (herniated nucleus pulposus), lumbar 11/02/2017   Carotid artery disease without cerebral infarction (HCC) 04/09/2015   Chest pain of uncertain etiology 06/21/2014   Diabetic neuropathy (HCC) 06/21/2014   Essential hypertension 06/21/2014   Hypercholesterolemia 06/21/2014   Rotator cuff tear 10/15/2011   IDDM (insulin dependent diabetes mellitus) 10/16/2008    PCP: Geoffry Paradise, MD  REFERRING PROVIDER: Geoffry Paradise, MD   REFERRING DIAG: M54.40 (ICD-10-CM) - Lumbago with sciatica, unspecified side   Rationale for Evaluation and Treatment Rehabilitation  THERAPY DIAG:  Muscle weakness (generalized)  Other low back pain  Difficulty in walking, not elsewhere classified  ONSET DATE: PT order 04/21/2022 / Surgery March 2022  SUBJECTIVE:                                                                                                                                                                                           SUBJECTIVE STATEMENT:   "Overall I feel progress."  Pt states other medical issues (neuropathy, knee pain, and vertigo)  limit his progress, activity, and exercises.  Pt states his back is feeling ok, but his  knees are still bothering him.  He does have weakness in his back with standing activities.  Pt states he sees improvement with standing to dry off after showering and sometimes no improvement.  Pt states some days he can get up and walk easier with improved posture and other days he can't.  Pt states he is performing his HEP intermittently.  Pt states at times he can walk his normal distance without significant difficulty and pain and other times not.       PERTINENT HISTORY:  -3 back surgeries:  L3-5 laminotomy and pt thinks he has some screws in lumbar March 2022, Lumbar laminectomy/decompression/microdisctomy in 2019, Lumbar surgery in 1964  -OA, Bilat knee pain R > L, CAD, Peripheral neuropathy, Vertigo, Pseudogout, DM type II and diabetic retinopathy,   -Pt has Insulin monitor and tremors in bilat Ue's R > L  -PSHx:  R shoulder surgery in 2013 and was unable to repair rotator cuff, Coronary angioplasty with stent placement in 2015 and 2019, and L knee arthroscopy in 2012    PAIN:  NPRS:  2/10 current, 7/10 worst Location:  lumbar  Worst pain is standing in shower  NPRS:  3-4/10 Location: bilat knees     PRECAUTIONS: Other: multiple back surgeries, peripheral neuropathy, R shoulder RCR  WEIGHT BEARING RESTRICTIONS No  FALLS:  Has patient fallen in last 6 months? No  LIVING ENVIRONMENT: Lives with: lives with their spouse Lives in: 1 story home Stairs: 3-4 steps with rail Has following equipment at home: Single point cane, Environmental consultant - 2 wheeled, and Crutches  OCCUPATION: Pt works from home and sets his own hours.  PLOF: Independent; Pt began using SPC in early 2019.  Pt was able to perform daily mobility and daily activities with greater ease and less difficulty  PATIENT GOALS improve mobility, get rid of cane, improve strength legs and back.  Improve lumbar stiffness.  Improve performance of stairs   OBJECTIVE:   DIAGNOSTIC FINDINGS:  FINDINGS: Segmentation:   Standard.   Alignment:  Trace anterolisthesis L3 on L4.   Vertebrae: No fracture, evidence of discitis, or bone lesion. There is some degenerative endplate signal change at L3-4.   Conus medullaris and cauda equina: Conus extends to the T12 level. Conus and cauda equina appear normal.   Paraspinal and other soft tissues: Negative.   Disc levels:   T11-12 and T12-L1 are imaged in the sagittal plane only and negative.   L1-2: Minimal disc bulge without stenosis.   L2-3: Shallow disc bulge and mild ligamentum flavum thickening. Mild central canal and left foraminal narrowing appear unchanged. The right foramen is open.   L3-4: Status post left laminotomy. There has been progression of disease at this level. A new and very large central left paracentral disc protrusion extends into the left foramen. There is severe compression of the thecal sac and narrowing in the lateral recesses, worse on the left. Moderate bilateral foraminal narrowing is worse on the left.   L4-5: Status post left laminotomy as seen on the prior exam. There is a shallow broad-based disc bulge. The narrowing in the subarticular recesses is present which could impact either descending L5 root. Mild bilateral foraminal narrowing. No change.   L5-S1: A right subarticular recess protrusion impinging on the right S1 root appears unchanged. Foramina are open.   IMPRESSION: Since the prior MRI, the patient has developed a large central  and left paracentral disc protrusion at L3-4 causing severe compression of the thecal sac and narrowing in the lateral recesses, worse on the left. Moderate bilateral foraminal narrowing at this level is worse on the left. Prior left laminotomy noted.   No change in a broad-based disc bulge at L4-5 causing narrowing in the subarticular recesses which could impact either descending L5 root. Left laminotomy defect is seen at this level.   No change in a right subarticular recess  protrusion at L5-S1. The disc appears to impinge on the right S1 root.     TODAY'S TREATMENT       Therapeutic Exercise:   Lateral band walks with RTB around thighs with UE support on rail x 3 laps   Standing cable rows with scap retraction with 10# with CGA 2x10 with TrA   Seated rows with GTB 2x10 with TrA   Seated HS stretch 3x30 sec bilat         Reviewed current function, HEP compliance, and pain levels. PT reviewed HEP with pt and instructed pt in correct exercises.  See below for pt education  FOTO:  Prior/Current: 46/43.  Goal of 48.   PATIENT EDUCATION:  Education details:  PT spent time educating pt concerning POC/discharge planning.  Goal progress/deficits. HEP, exercise form, and rationale of exercises.  Instructed pt to perform HEP. Person educated: Patient Education method: Explanation, Demonstration, Verbal cues Education comprehension:  verbalized understanding, returned demonstration, verbal cues required   HOME EXERCISE PROGRAM: Access Code: [email or web address] URL: [email or web address] Date: 05/12/2022 Prepared by: Aaron Edelman  Exercises - Supine Transversus Abdominis Bracing - Hands on Stomach  - 2 x daily - 7 x weekly - 2 sets - 10 reps - 5 seconds hold - Supine March  - 1 x daily - 7 x weekly - 2 sets - 10 reps - Hooklying Clamshell with Resistance  - 1 x daily - 4-5 x weekly - 2 sets - 10 reps - Supine Active Straight Leg Raise  - 1 x daily - 5-6 x weekly - 2 sets - 10 reps - Clamshell  - 1 x daily - 5-6 x weekly - 2 sets - 10 reps  ASSESSMENT:  CLINICAL IMPRESSION: Overall pt has made progress, though has currently plateaued with progress.  He doesn't have consistent improvement.  He states at times he has improved with ambulation, mobility, and standing to dry off after a shower and other times he doesn't see improvement.  He continues to c/o of weakness in his back with standing activities.  Pt does have other medical issues including  neuropathy, knee pain, and vertigo which has affected his mobility and sx's.  Pt is limited with ambulation distance which is also affected by his knees.  Pt declined the 6 MWT today due to bilat knee pain. His prior 6 MWT was worse when tested at the end of June.  Pt has improved in strength overall in bilat LE's.  PT reviewed HEP and went through HEP to ensure understanding.  PT instructed pt on the importance of performing HEP.  Pt has not made recent progress toward goals.  He has met STG's #1,3,4 and LTG #2 as stated prior.  Pt is ready for discharge.        OBJECTIVE IMPAIRMENTS Abnormal gait, decreased activity tolerance, decreased endurance, decreased mobility, difficulty walking, decreased strength, impaired flexibility, and pain.   ACTIVITY LIMITATIONS standing, squatting, stairs, transfers, bed mobility, and locomotion level  PARTICIPATION LIMITATIONS: meal prep, cleaning, and community activity  PERSONAL FACTORS 3+ comorbidities: multiple back surgeries, OA, Bilat knee pain R > L, Peripheral neuropathy, Pseudogout, DM type II, and L knee scope   are also affecting patient's functional outcome.   REHAB POTENTIAL: Good  CLINICAL DECISION MAKING: Evolving/moderate complexity  EVALUATION COMPLEXITY: Moderate   GOALS:   SHORT TERM GOALS: Target date: 06/02/2022  Pt will be independent with HEP for improved pain, strength, and function.  Baseline: Goal status: Goal met  2.  Pt will tolerate aquatic therapy without adverse effects for improved strength and tolerance to activity.  Baseline:  Goal status: Discontinue.  Pt doesn't want to perform aquatic therapy. Target date: 05/26/2022  3.  Pt will demo improved gait speed and increased step length.  Baseline:  Goal status: goal met  4.  Pt will report at least a 25% improvement in pain and tolerance to daily mobility.  Baseline:  Goal status: Goal met    LONG TERM GOALS: Target date: 03/03/2023   Pt will be able to  perform his daily transfers without difficultly.  Baseline:  Goal status: NOT MET  2.  Pt will demo at least a 6-8 lb increase in bilat hip flexion and hip abd strength and L knee flexion to 5/5 MMT for improved performance of functional mobility skills including ambulation, stairs, and transfers.  Baseline:  Goal status:  goal met  3.  Pt will report improved standing tolerance with ADLs/IADLs including preparing food and drying off after a shower.   Baseline:  Goal status: not met  4.  Pt will report improved tolerance with community ambulation and the ability to ambulate his normal distance without significant difficulty and pain.   Baseline:  Goal status: not met  5.  Pt will increase his 6 MWT ambulation distance by 100 ft for improved tolerance with community ambulation.     Goal status: NOT MET   PLAN:  PLANNED INTERVENTIONS: Therapeutic exercises, Therapeutic activity, Neuromuscular re-education, Balance training, Gait training, Patient/Family education, Self Care, Joint mobilization, Stair training, Aquatic Therapy, Dry Needling, Electrical stimulation, Cryotherapy, Moist heat, Taping, Manual therapy, and Re-evaluation.  PLAN FOR NEXT SESSION:   Pt to be discharged from skilled PT services due to plateauing in functional progress and recent lack of progress toward goals.  Pt will cont with HEP.  Pt agreeable with discharge.   PHYSICAL THERAPY DISCHARGE SUMMARY  Visits from Start of Care: 28  Current functional level related to goals / functional outcomes: See above   Remaining deficits: See above   Education / Equipment: HEP     Audie Clear III PT, DPT 03/03/23 10:07 PM

## 2023-03-03 ENCOUNTER — Encounter (HOSPITAL_BASED_OUTPATIENT_CLINIC_OR_DEPARTMENT_OTHER): Payer: Self-pay | Admitting: Physical Therapy

## 2023-03-04 ENCOUNTER — Ambulatory Visit (HOSPITAL_BASED_OUTPATIENT_CLINIC_OR_DEPARTMENT_OTHER): Payer: Medicare Other | Admitting: Orthopaedic Surgery

## 2023-03-04 ENCOUNTER — Other Ambulatory Visit (HOSPITAL_BASED_OUTPATIENT_CLINIC_OR_DEPARTMENT_OTHER): Payer: Self-pay

## 2023-03-04 DIAGNOSIS — M25562 Pain in left knee: Secondary | ICD-10-CM

## 2023-03-04 DIAGNOSIS — M170 Bilateral primary osteoarthritis of knee: Secondary | ICD-10-CM | POA: Diagnosis not present

## 2023-03-04 DIAGNOSIS — M25561 Pain in right knee: Secondary | ICD-10-CM | POA: Diagnosis not present

## 2023-03-04 MED ORDER — TRIAMCINOLONE ACETONIDE 40 MG/ML IJ SUSP
80.0000 mg | INTRAMUSCULAR | Status: AC | PRN
Start: 2023-03-04 — End: 2023-03-04
  Administered 2023-03-04: 80 mg via INTRA_ARTICULAR

## 2023-03-04 MED ORDER — LIDOCAINE HCL 1 % IJ SOLN
4.0000 mL | INTRAMUSCULAR | Status: AC | PRN
Start: 2023-03-04 — End: 2023-03-04
  Administered 2023-03-04: 4 mL

## 2023-03-04 MED ORDER — LIDOCAINE 5 % EX PTCH
1.0000 | MEDICATED_PATCH | CUTANEOUS | 0 refills | Status: DC
  Filled 2023-03-04: qty 30, 30d supply, fill #0

## 2023-03-04 NOTE — Progress Notes (Signed)
Chief Complaint: Right knee pain     History of Present Illness:   03/04/2023: Follow-up of bilateral knees.  He is hoping to have an additional injection in both knees today.  He is also experiencing Achilles pain on both sides.  Seth Young is a 82 y.o. male presents today with right worse than left knee pain which has been going on for several years.  He states that he did have an injection into the knees several years prior which did give him relief.  He has not had anything since then.  He has recently had a series of several back surgeries and as result is hoping to avoid any type of surgical intervention for the knees.  He is complaining of crepitus as well as pain going up and down stairs or with most activities of daily living.  He does enjoy traveling in a spare time although this has been limited as result of his knee pain.    Surgical History:   None  PMH/PSH/Family History/Social History/Meds/Allergies:    Past Medical History:  Diagnosis Date   Arthritis    "knees, toes, hands, probably in my back" (03/09/2018)   Ataxia    Basal cell carcinoma    left temporal area-no residual   Colon polyps 10-12-11   past hx.   Coronary artery disease    Exertional angina 06/26/2014   Cath with DES to the OM, Bioflow protocol    GERD (gastroesophageal reflux disease)    H/O hiatal hernia    no problems   High cholesterol    History of echocardiogram    Echo 7/19:  Mild LVH, mild focal basal septal hypertrophy, EF 60-65, no RWMA, Gr 1 DD, trivial AI, MAC   Hypertension    Ischemic chest pain (HCC) 08/07/2014   Occasional tremors    Bilateral hands   Peripheral neuropathy    Pneumonia 1940's X 2; ~ 2017   "infant; walking pneumonia" (03/09/2018)   Pseudogout    "it moves around"   Sleep apnea    no cpap used   Type II diabetes mellitus (HCC) dx'd 1982   nsulin started ~ 2000   Vertigo    Past Surgical History:  Procedure Laterality Date    BACK SURGERY     BASAL CELL CARCINOMA EXCISION Left ~ 2012   face   CARDIAC CATHETERIZATION  02/2005   Dr. Patty Sermons; negative.   CARDIAC CATHETERIZATION N/A 05/13/2016   Procedure: Left Heart Cath and Coronary Angiography;  Surgeon: Jake Bathe, MD;  Location: [facility];  Service: Cardiovascular;  Laterality: N/A;   CARPAL TUNNEL RELEASE Left 10/2009   CARPAL TUNNEL RELEASE Right ~ 2013   CATARACT EXTRACTION W/ INTRAOCULAR LENS IMPLANT Left 2000's   CATARACT EXTRACTION W/ INTRAOCULAR LENS IMPLANT Right 2015   COLONOSCOPY W/ POLYPECTOMY     CORONARY ANGIOPLASTY WITH STENT PLACEMENT  06/26/2014   "1"   CORONARY ANGIOPLASTY WITH STENT PLACEMENT  06/26/2014   pLAD 50%, d LAD 60%, oD1 80%,  mD1  70%, CFX 50%, OM 2 70%,  RCA 80%, PDA 95% (<75mm), OM1 99%-0% with Bio flow stent        CORONARY ANGIOPLASTY WITH STENT PLACEMENT  03/09/2018   CORONARY STENT INTERVENTION N/A 03/09/2018   Procedure: CORONARY STENT INTERVENTION;  Surgeon: Clifton James,  Nile Dear, MD;  Location: [facility];  Service: Cardiovascular;  Laterality: N/A;   ELBOW SURGERY Bilateral ~ 2014   "for blockages; Dr. Amanda Pea"   ENDARTERECTOMY Left 04/23/2015   Procedure: ENDARTERECTOMY CAROTID;  Surgeon: Fransisco Hertz, MD;  Location: [facility] OR;  Service: Vascular;  Laterality: Left;   EYE SURGERY Left    "related go diabetes; laser"   KNEE ARTHROSCOPY Left 05/2011   LEFT HEART CATH AND CORONARY ANGIOGRAPHY N/A 03/09/2018   Procedure: LEFT HEART CATH AND CORONARY ANGIOGRAPHY;  Surgeon: Kathleene Hazel, MD;  Location: [facility];  Service: Cardiovascular;  Laterality: N/A;   LEFT HEART CATHETERIZATION WITH CORONARY ANGIOGRAM N/A 06/26/2014   Procedure: LEFT HEART CATHETERIZATION WITH CORONARY ANGIOGRAM;  Surgeon: Micheline Chapman, MD;  Location[facility];  Service: Cardiovascular;  Laterality: N/A;   LEFT HEART CATHETERIZATION WITH CORONARY ANGIOGRAM N/A 08/07/2014   Procedure: LEFT HEART CATHETERIZATION  WITH CORONARY ANGIOGRAM;  Surgeon: Micheline Chapman, MD;  Location: [facility] CATH LAB;  Service: Cardiovascular;  Laterality: N/A;   LUMBAR LAMINECTOMY/DECOMPRESSION MICRODISCECTOMY Left 11/02/2017   Procedure: MICRODISCECTOMY LUMBAR THREE- LUMBAR FOUR, LEFT;  Surgeon: Coletta Memos, MD;  Location: [facility] OR;  Service: Neurosurgery;  Laterality: Left;   NASAL SEPTUM SURGERY  1979   PATCH ANGIOPLASTY Left 04/23/2015   Procedure: PATCH ANGIOPLASTY USING 1CM X 6CM XENOSURE BIOLOGIC PATCH;  Surgeon: Fransisco Hertz, MD;  Location: [facility] OR;  Service: Vascular;  Laterality: Left;   POSTERIOR LUMBAR FUSION  1964   SHOULDER OPEN ROTATOR CUFF REPAIR  10/14/2011   Procedure: ROTATOR CUFF REPAIR SHOULDER OPEN;  Surgeon: Javier Docker, MD;  Location: [facility] ORS;  Service: Orthopedics;  Laterality: Right;   SUBACROMIAL DECOMPRESSION  10/14/2011   Procedure: SUBACROMIAL DECOMPRESSION;  Surgeon: Javier Docker, MD;  Location: [facility] ORS;  Service: Orthopedics;  Laterality: Right;   TRIGGER FINGER RELEASE Right ~ 2013-2014   "3rd & 4th digits"   Social History   Socioeconomic History   Marital status: Married    Spouse name: Not on file   Number of children: 5   Years of education: College   Highest education level: Not on file  Occupational History   Not on file  Tobacco Use   Smoking status: Former    Current packs/day: 0.00    Average packs/day: 0.5 packs/day for 20.0 years (10.0 ttl pk-yrs)    Types: Cigarettes    Start date: 10/11/1968    Quit date: 10/11/1988    Years since quitting: 34.4   Smokeless tobacco: Never  Vaping Use   Vaping status: Never Used  Substance and Sexual Activity   Alcohol use: No    Alcohol/week: 0.0 standard drinks of alcohol    Comment: Quit drinking in 1990   Drug use: No   Sexual activity: Not Currently  Other Topics Concern   Not on file  Social History Narrative   Drinks about 2 cups of coffee a day, occasional diet pepsi.    Social Determinants of Health   Financial Resource  Strain: Not on file  Food Insecurity: Not on file  Transportation Needs: Not on file  Physical Activity: Not on file  Stress: Not on file  Social Connections: Not on file   Family History  Problem Relation Age of Onset   Diabetes Mother    Varicose Veins Mother    Heart attack Father 75       "massive heart attack"   Cancer Sister  Diabetes Sister    Hypertension Sister    Varicose Veins Sister    Diabetes Brother    Hypertension Brother    Allergies  Allergen Reactions   Dulaglutide     Other reaction(s): localized site reaction, dyspepsia   Insulin Degludec     Other reaction(s): Itching and swelling in hands   Gabapentin Other (See Comments)    Ataxia   Morphine And Codeine Itching        Naproxen Other (See Comments)    Neurological reaction. Tremors, hallucinates    Current Outpatient Medications  Medication Sig Dispense Refill   lidocaine (LIDODERM) 5 % Place 1 patch onto the skin daily. Remove & Discard patch within 12 hours or as directed by MD 30 patch 0   acetaminophen (TYLENOL) 325 MG tablet Take 650 mg by mouth every 6 (six) hours as needed for moderate pain or headache.      aspirin 81 MG tablet Take 1 tablet (81 mg total) by mouth daily.     atorvastatin (LIPITOR) 20 MG tablet Take 20 mg by mouth daily.     augmented betamethasone dipropionate (DIPROLENE-AF) 0.05 % cream as needed for itching.     chlorhexidine (PERIDEX) 0.12 % solution SWISH WITH 1-2 OUNCE FOR 30 SECONDS THEN SPIT TWICE DAILY as needed for flare up  0   chlorpheniramine-HYDROcodone (TUSSIONEX) 10-8 MG/5ML SUER as needed for cough.     clopidogrel (PLAVIX) 75 MG tablet Take 1 tablet (75 mg total) by mouth daily. Please call [phone] to schedule an August appointment for future refills. Thank you. 90 tablet 0   colchicine 0.6 MG tablet Take 0.6 mg by mouth daily as needed (gout).      Cyanocobalamin (B-12) 5000 MCG CAPS Take 5,000 mcg by mouth daily.     dorzolamide-timolol (COSOPT)  22.3-6.8 MG/ML ophthalmic solution Place 1 drop into both eyes 2 (two) times daily.     empagliflozin (JARDIANCE) 25 MG TABS tablet Take 25 mg by mouth daily.     HUMALOG KWIKPEN 200 UNIT/ML SOPN Inject 10-22 Units as directed 3 (three) times daily. SLIDING SCALE  1   HYDROcodone-acetaminophen (NORCO) 7.5-325 MG tablet as needed for pain.     insulin glargine (LANTUS) 100 UNIT/ML injection Inject 40 Units into the skin 2 (two) times daily. Pt take 40 unit in the morning and 40 unit at bedtime.     Insulin Pen Needle 31G X 8 MM MISC      isosorbide mononitrate (IMDUR) 60 MG 24 hr tablet Take 1 tablet (60 mg total) by mouth 2 (two) times daily. 180 tablet 0   lisinopril (ZESTRIL) 5 MG tablet Take 1 tablet by mouth once daily 90 tablet 3   LUMIGAN 0.01 % SOLN Place 1 drop into the left eye at bedtime.     LYRICA 100 MG capsule Take 50 mg by mouth 2 (two) times daily as needed (1 in the AM and 2 in the PM). For neuropathic pain  2   Melatonin 5 MG TABS Take 5 mg by mouth at bedtime.     metFORMIN (GLUCOPHAGE) 1000 MG tablet Take 1,000 mg by mouth 2 (two) times daily with a meal.     methocarbamol (ROBAXIN) 750 MG tablet as needed for pain.     metoprolol tartrate (LOPRESSOR) 100 MG tablet Take 1 tablet by mouth twice daily 180 tablet 0   Multiple Vitamin (MULTI VITAMIN) TABS daily.     nitroGLYCERIN (NITROSTAT) 0.4 MG SL tablet DISSOLVE ONE TABLET  UNDER THE TONGUE EVERY 5 MINUTES AS NEEDED FOR CHEST PAIN. 25 tablet 0   Omega-3 Fatty Acids (FISH OIL PO) Take 520 mg by mouth daily.     OVER THE COUNTER MEDICATION Apply 1 application topically daily as needed (nerve pain). Theraworx otc pain relief foam     pantoprazole (PROTONIX) 40 MG tablet Take 1 tablet by mouth twice daily 180 tablet 0   Probiotic Product (PROBIOTIC PO) Take 1 tablet by mouth daily.     pyridoxine (B-6) 100 MG tablet Take 100 mg by mouth daily.     triamcinolone cream (KENALOG) 0.1 % Apply 1 application topically daily as needed  (lichen planus). With cetaphil     No current facility-administered medications for this visit.   No results found.  Review of Systems:   A ROS was performed including pertinent positives and negatives as documented in the HPI.  Physical Exam :   Constitutional: NAD and appears stated age Neurological: Alert and oriented Psych: Appropriate affect and cooperative There were no vitals taken for this visit.   Comprehensive Musculoskeletal Exam:    Bilateral crepitus tricompartmental of both knees.  Range of motion is from 0 to 100 degrees with crepitus.  Tricompartmental pain.  Small effusion bilaterally  Imaging:   Xray (4 views right knee, 4 views left knee): There is evidence of chondrocalcinosis with moderate osteoarthritis of bilateral knees    I personally reviewed and interpreted the radiographs.   Assessment:   82 y.o. male with moderate osteoarthritis which I do believe is more significant for him today than his pseudogout.  At this time he is requesting additional bilateral ultrasound-guided injections which were performed into both knees today.  With regard to the Achilles tendon I have counseled and recommended bilateral lidocaine patches to hopefully get him some relief from his neuropathic type pain  -Bilateral ultrasound-guided knee injections performed today after verbal consent obtained    Procedure Note  Patient: Seth Young             Date of Birth: [birth year]           MRN: [id number]             Visit Date: 03/04/2023  Procedures: Visit Diagnoses:  No diagnosis found.   Large Joint Inj: R knee on 03/04/2023 3:21 PM Indications: pain Details: 22 G 1.5 in needle, ultrasound-guided anterior approach  Arthrogram: No  Medications: 4 mL lidocaine 1 %; 80 mg triamcinolone acetonide 40 MG/ML Outcome: tolerated well, no immediate complications Procedure, treatment alternatives, risks and benefits explained, specific risks discussed. Consent was given  by the patient. Immediately prior to procedure a time out was called to verify the correct patient, procedure, equipment, support staff and site/side marked as required. Patient was prepped and draped in the usual sterile fashion.    Large Joint Inj: L knee on 03/04/2023 3:21 PM Indications: pain Details: 22 G 1.5 in needle, ultrasound-guided anterior approach  Arthrogram: No  Medications: 4 mL lidocaine 1 %; 80 mg triamcinolone acetonide 40 MG/ML Outcome: tolerated well, no immediate complications Procedure, treatment alternatives, risks and benefits explained, specific risks discussed. Consent was given by the patient. Immediately prior to procedure a time out was called to verify the correct patient, procedure, equipment, support staff and site/side marked as required. Patient was prepped and draped in the usual sterile fashion.           I personally saw and evaluated the patient, and participated in the management and  treatment plan.  Huel Cote, MD Attending Physician, Orthopedic Surgery  This document was dictated using Dragon voice recognition software. A reasonable attempt at proof reading has been made to minimize errors.

## 2023-03-09 ENCOUNTER — Other Ambulatory Visit: Payer: Self-pay | Admitting: Nurse Practitioner

## 2023-03-09 ENCOUNTER — Encounter (HOSPITAL_BASED_OUTPATIENT_CLINIC_OR_DEPARTMENT_OTHER): Payer: Medicare Other | Admitting: Physical Therapy

## 2023-03-09 ENCOUNTER — Other Ambulatory Visit: Payer: Self-pay | Admitting: Cardiology

## 2023-03-09 ENCOUNTER — Ambulatory Visit (HOSPITAL_BASED_OUTPATIENT_CLINIC_OR_DEPARTMENT_OTHER): Payer: Medicare Other | Admitting: Orthopaedic Surgery

## 2023-03-09 DIAGNOSIS — R079 Chest pain, unspecified: Secondary | ICD-10-CM

## 2023-03-15 DIAGNOSIS — E1129 Type 2 diabetes mellitus with other diabetic kidney complication: Secondary | ICD-10-CM | POA: Diagnosis not present

## 2023-04-14 DIAGNOSIS — E1129 Type 2 diabetes mellitus with other diabetic kidney complication: Secondary | ICD-10-CM | POA: Diagnosis not present

## 2023-04-18 ENCOUNTER — Other Ambulatory Visit (HOSPITAL_BASED_OUTPATIENT_CLINIC_OR_DEPARTMENT_OTHER): Payer: Self-pay

## 2023-04-18 MED ORDER — COMIRNATY 30 MCG/0.3ML IM SUSY
0.3000 mL | PREFILLED_SYRINGE | Freq: Once | INTRAMUSCULAR | 0 refills | Status: AC
  Filled 2023-04-18: qty 0.3, 1d supply, fill #0

## 2023-04-18 MED ORDER — FLUAD 0.5 ML IM SUSY
0.5000 mL | PREFILLED_SYRINGE | Freq: Once | INTRAMUSCULAR | 0 refills | Status: AC
  Filled 2023-04-18: qty 0.5, 1d supply, fill #0

## 2023-04-21 ENCOUNTER — Telehealth: Payer: Self-pay | Admitting: Orthopaedic Surgery

## 2023-04-21 NOTE — Telephone Encounter (Signed)
Patient called asked if he can get set up for the gel injection? The number to contact patient is [phone]

## 2023-04-26 NOTE — Telephone Encounter (Signed)
VOB submitted for Orthovisc, bilateral knee

## 2023-04-28 ENCOUNTER — Ambulatory Visit (HOSPITAL_BASED_OUTPATIENT_CLINIC_OR_DEPARTMENT_OTHER): Payer: Medicare Other | Admitting: Cardiology

## 2023-04-28 VITALS — BP 132/62 | HR 67 | Ht 71.5 in | Wt 246.0 lb

## 2023-04-28 DIAGNOSIS — I2510 Atherosclerotic heart disease of native coronary artery without angina pectoris: Secondary | ICD-10-CM | POA: Diagnosis not present

## 2023-04-28 DIAGNOSIS — I779 Disorder of arteries and arterioles, unspecified: Secondary | ICD-10-CM | POA: Diagnosis not present

## 2023-04-28 NOTE — Progress Notes (Signed)
Cardiology Office Note:  .   Date:  04/28/2023  ID:  Seth Young, DOB 1941/04/11, MRN [id number] PCP: Geoffry Paradise, MD  [facility] Providers Cardiologist:  Donato Schultz, MD    History of Present Illness: .   Seth Young is a 82 y.o. male here for follow-up coronary artery disease mid LAD DES placement 2019, left carotid endarterectomy 2016, hyperlipidemia, hypertension.  Discussed the use of AI scribe software   History of Present Illness   He reports occasional difficulty breathing and fatigue but denies any chest pain. He also mentions having issues with his Achilles tendons, which he initially thought were related to neuropathy. He has been experiencing fluctuations in his blood sugar levels, which he attributes to difficulties in managing his diabetes with his current sliding scale regimen.       ROS: No CP, no SOB. Mild Achilles tendinitis  Studies Reviewed: Marland Kitchen   EKG Interpretation Date/Time:  Thursday April 28 2023 11:00:09 EDT Ventricular Rate:  68 PR Interval:  248 QRS Duration:  88 QT Interval:  384 QTC Calculation: 408 R Axis:   30  Text Interpretation: Sinus rhythm with 1st degree A-V block When compared with ECG of 10-Mar-2018 04:03, No significant change was found Confirmed by Donato Schultz ([id number]) on 04/28/2023 11:07:47 AM    LABS LDL: 69 (12/15/2022) Hb: 13.9 Cr: 1.3 (2020) HbA1c: 7.2  RADIOLOGY Carotid Doppler: Mild right coronary artery stenosis 1-39%, patent left carotid endarterectomy site (02/28/2018)  DIAGNOSTIC Echocardiogram: Normal ejection fraction 60%, mild diastolic dysfunction, mild left ventricular hypertrophy, trivial aortic regurgitation (02/2018) Cardiac catheterization: Drug-eluting stent placement to the mid LAD, PDA and diagonal branches too small for stenting (2019)  Risk Assessment/Calculations:            Physical Exam:   VS:  BP 132/62   Pulse 67   Ht 5' 11.5" (1.816 m)   Wt 246 lb (111.6 kg)   SpO2 95%    BMI 33.83 kg/m    Wt Readings from Last 3 Encounters:  04/28/23 246 lb (111.6 kg)  04/02/22 246 lb (111.6 kg)  07/07/21 248 lb (112.5 kg)    GEN: Well nourished, well developed in no acute distress NECK: No JVD; No carotid bruits CARDIAC: RRR, no murmurs, rubs, gallops RESPIRATORY:  Clear to auscultation without rales, wheezing or rhonchi  ABDOMEN: Soft, non-tender, non-distended EXTREMITIES:  No edema; No deformity   ASSESSMENT AND PLAN: .    Assessment and Plan    Coronary Artery Disease Status post drug-eluting stent placement to the mid LAD in 2019. No current chest pain. Echocardiogram in 2019 showed normal ejection fraction (60%), mild diastolic dysfunction, mild left ventricular hypertrophy, and trivial aortic regurgitation. -Continue Aspirin 81mg  daily, Atorvastatin 20mg  daily, Isosorbide 60mg  twice daily, Plavix 75mg  daily, and Metoprolol 100mg  twice daily.  Carotid Artery Disease Status post left carotid endarterectomy 2016. Last carotid Doppler in 2019 showed mild right coronary artery stenosis (1-39%) and patent left carotid endarterectomy site. -Undergo scheduled carotid Doppler tomorrow.  Type 2 Diabetes Mellitus Hemoglobin A1c of 7.2, up from usual 6.5. Patient reports difficulty managing blood glucose levels with current sliding scale regimen. Home nurse to provide further education. -Continue Jardiance 25mg  daily. -Home nurse to provide further education on managing blood glucose levels.  Hypertension Managed with Lisinopril 5mg  daily. -Continue Lisinopril 5mg  daily.   Chronic Kidney Disease 3a Mildly elevated creatinine (1.3 in 2020). -Continue Lisinopril 5mg  daily for renal protection.  Follow-up in 1 year.  Signed, Donato Schultz, MD

## 2023-04-28 NOTE — Patient Instructions (Signed)
Medication Instructions:  The current medical regimen is effective;  continue present plan and medications.  *If you need a refill on your cardiac medications before your next appointment, please call your pharmacy*   Testing/Procedures: Carotid doppler as scheduled tomorrow.   Follow-Up: At [facility], you and your health needs are our priority.  As part of our continuing mission to provide you with exceptional heart care, we have created designated Provider Care Teams.  These Care Teams include your primary Cardiologist (physician) and Advanced Practice Providers (APPs -  Physician Assistants and Nurse Practitioners) who all work together to provide you with the care you need, when you need it.  We recommend signing up for the patient portal called "MyChart".  Sign up information is provided on this After Visit Summary.  MyChart is used to connect with patients for Virtual Visits (Telemedicine).  Patients are able to view lab/test results, encounter notes, upcoming appointments, etc.  Non-urgent messages can be sent to your provider as well.   To learn more about what you can do with MyChart, go to [email or web address].    Your next appointment:   1 year(s)  Provider:   Donato Schultz, MD

## 2023-04-29 ENCOUNTER — Ambulatory Visit (HOSPITAL_COMMUNITY)
Admission: RE | Admit: 2023-04-29 | Discharge: 2023-04-29 | Disposition: A | Discharge status: Home / Self Care | Payer: Medicare Other | Source: Ambulatory Visit | Attending: Cardiovascular Disease | Admitting: Cardiovascular Disease

## 2023-04-29 DIAGNOSIS — I779 Disorder of arteries and arterioles, unspecified: Secondary | ICD-10-CM

## 2023-05-02 ENCOUNTER — Other Ambulatory Visit: Payer: Self-pay

## 2023-05-02 ENCOUNTER — Telehealth: Payer: Self-pay

## 2023-05-02 DIAGNOSIS — M170 Bilateral primary osteoarthritis of knee: Secondary | ICD-10-CM

## 2023-05-02 NOTE — Telephone Encounter (Signed)
Talked with patient to schedule for gel injections, but patient would like to hold off on making other appts., until he has the first one.

## 2023-05-04 ENCOUNTER — Ambulatory Visit (HOSPITAL_BASED_OUTPATIENT_CLINIC_OR_DEPARTMENT_OTHER): Payer: Medicare Other | Admitting: Orthopaedic Surgery

## 2023-05-04 ENCOUNTER — Telehealth (HOSPITAL_BASED_OUTPATIENT_CLINIC_OR_DEPARTMENT_OTHER): Payer: Self-pay | Admitting: Orthopaedic Surgery

## 2023-05-04 NOTE — Telephone Encounter (Signed)
Left patient a message to call the office and resch because the injections where not in the office today .

## 2023-05-05 ENCOUNTER — Ambulatory Visit (HOSPITAL_BASED_OUTPATIENT_CLINIC_OR_DEPARTMENT_OTHER): Payer: Medicare Other | Admitting: Student

## 2023-05-05 ENCOUNTER — Encounter (HOSPITAL_BASED_OUTPATIENT_CLINIC_OR_DEPARTMENT_OTHER): Payer: Self-pay | Admitting: Student

## 2023-05-05 DIAGNOSIS — M170 Bilateral primary osteoarthritis of knee: Secondary | ICD-10-CM

## 2023-05-05 DIAGNOSIS — M25562 Pain in left knee: Secondary | ICD-10-CM

## 2023-05-05 DIAGNOSIS — M25561 Pain in right knee: Secondary | ICD-10-CM | POA: Diagnosis not present

## 2023-05-05 MED ORDER — HYALURONAN 30 MG/2ML IX SOSY
30.0000 mg | PREFILLED_SYRINGE | INTRA_ARTICULAR | Status: AC | PRN
Start: 2023-05-05 — End: 2023-05-05
  Administered 2023-05-05: 30 mg via INTRA_ARTICULAR

## 2023-05-05 NOTE — Progress Notes (Signed)
Chief Complaint: Bilateral knee pain     History of Present Illness:   05/05/2023: Patient presents today for first round of bilateral Orthovisc injections.  He states that last round of cortisone injections on 03/04/2023 got him a maximum 6 weeks of relief.  He continues to have mild to moderate pain that is preventing him from being able to perform normal daily activities and take trips.  He does describe specific difficulty going up stairs.   03/04/2023: KHAMANI FABRY is a 82 y.o. male presents today with right worse than left knee pain which has been going on for several years.  He states that he did have an injection into the knees several years prior which did give him relief.  He has not had anything since then.  He has recently had a series of several back surgeries and as result is hoping to avoid any type of surgical intervention for the knees.  He is complaining of crepitus as well as pain going up and down stairs or with most activities of daily living.  He does enjoy traveling in a spare time although this has been limited as result of his knee pain.    Surgical History:   None  PMH/PSH/Family History/Social History/Meds/Allergies:    Past Medical History:  Diagnosis Date   Arthritis    "knees, toes, hands, probably in my back" (03/09/2018)   Ataxia    Basal cell carcinoma    left temporal area-no residual   Colon polyps 10-12-11   past hx.   Coronary artery disease    Exertional angina 06/26/2014   Cath with DES to the OM, Bioflow protocol    GERD (gastroesophageal reflux disease)    H/O hiatal hernia    no problems   High cholesterol    History of echocardiogram    Echo 7/19:  Mild LVH, mild focal basal septal hypertrophy, EF 60-65, no RWMA, Gr 1 DD, trivial AI, MAC   Hypertension    Ischemic chest pain (HCC) 08/07/2014   Occasional tremors    Bilateral hands   Peripheral neuropathy    Pneumonia 1940's X 2; ~ 2017   "infant;  walking pneumonia" (03/09/2018)   Pseudogout    "it moves around"   Sleep apnea    no cpap used   Type II diabetes mellitus (HCC) dx'd 1982   nsulin started ~ 2000   Vertigo    Past Surgical History:  Procedure Laterality Date   BACK SURGERY     BASAL CELL CARCINOMA EXCISION Left ~ 2012   face   CARDIAC CATHETERIZATION  02/2005   Dr. Patty Sermons; negative.   CARDIAC CATHETERIZATION N/A 05/13/2016   Procedure: Left Heart Cath and Coronary Angiography;  Surgeon: Jake Bathe, MD;  Location: [facility];  Service: Cardiovascular;  Laterality: N/A;   CARPAL TUNNEL RELEASE Left 10/2009   CARPAL TUNNEL RELEASE Right ~ 2013   CATARACT EXTRACTION W/ INTRAOCULAR LENS IMPLANT Left 2000's   CATARACT EXTRACTION W/ INTRAOCULAR LENS IMPLANT Right 2015   COLONOSCOPY W/ POLYPECTOMY     CORONARY ANGIOPLASTY WITH STENT PLACEMENT  06/26/2014   "1"   CORONARY ANGIOPLASTY WITH STENT PLACEMENT  06/26/2014   pLAD 50%, d LAD 60%, oD1 80%,  mD1  70%, CFX 50%, OM 2 70%,  RCA 80%, PDA 95% (<  1mm), OM1 99%-0% with Bio flow stent        CORONARY ANGIOPLASTY WITH STENT PLACEMENT  03/09/2018   CORONARY STENT INTERVENTION N/A 03/09/2018   Procedure: CORONARY STENT INTERVENTION;  Surgeon: Kathleene Hazel, MD;  Location: [facility];  Service: Cardiovascular;  Laterality: N/A;   ELBOW SURGERY Bilateral ~ 2014   "for blockages; Dr. Amanda Pea"   ENDARTERECTOMY Left 04/23/2015   Procedure: ENDARTERECTOMY CAROTID;  Surgeon: Fransisco Hertz, MD;  Location: [facility] OR;  Service: Vascular;  Laterality: Left;   EYE SURGERY Left    "related go diabetes; laser"   KNEE ARTHROSCOPY Left 05/2011   LEFT HEART CATH AND CORONARY ANGIOGRAPHY N/A 03/09/2018   Procedure: LEFT HEART CATH AND CORONARY ANGIOGRAPHY;  Surgeon: Kathleene Hazel, MD;  Location: [facility];  Service: Cardiovascular;  Laterality: N/A;   LEFT HEART CATHETERIZATION WITH CORONARY ANGIOGRAM N/A 06/26/2014   Procedure: LEFT HEART CATHETERIZATION  WITH CORONARY ANGIOGRAM;  Surgeon: Micheline Chapman, MD;  Location: [facility] CATH [facility];  Service: Cardiovascular;  Laterality: N/A;   LEFT HEART CATHETERIZATION WITH CORONARY ANGIOGRAM N/A 08/07/2014   Procedure: LEFT HEART CATHETERIZATION WITH CORONARY ANGIOGRAM;  Surgeon: Micheline Chapman, MD;  Location: [facility] CATH LAB;  Service: Cardiovascular;  Laterality: N/A;   LUMBAR LAMINECTOMY/DECOMPRESSION MICRODISCECTOMY Left 11/02/2017   Procedure: MICRODISCECTOMY LUMBAR THREE- LUMBAR FOUR, LEFT;  Surgeon: Coletta Memos, MD;  Location: [facility] OR;  Service: Neurosurgery;  Laterality: Left;   NASAL SEPTUM SURGERY  1979   PATCH ANGIOPLASTY Left 04/23/2015   Procedure: PATCH ANGIOPLASTY USING 1CM X 6CM XENOSURE BIOLOGIC PATCH;  Surgeon: Fransisco Hertz, MD;  Location: [facility] OR;  Service: Vascular;  Laterality: Left;   POSTERIOR LUMBAR FUSION  1964   SHOULDER OPEN ROTATOR CUFF REPAIR  10/14/2011   Procedure: ROTATOR CUFF REPAIR SHOULDER OPEN;  Surgeon: Javier Docker, MD;  Location: [facility];  Service: Orthopedics;  Laterality: Right;   SUBACROMIAL DECOMPRESSION  10/14/2011   Procedure: SUBACROMIAL DECOMPRESSION;  Surgeon: Javier Docker, MD;  Location: [facility];  Service: Orthopedics;  Laterality: Right;   TRIGGER FINGER RELEASE Right ~ 2013-2014   "3rd & 4th digits"   Social History   Socioeconomic History   Marital status: Married    Spouse name: Not on file   Number of children: 5   Years of education: College   Highest education level: Not on file  Occupational History   Not on file  Tobacco Use   Smoking status: Former    Current packs/day: 0.00    Average packs/day: 0.5 packs/day for 20.0 years (10.0 ttl pk-yrs)    Types: Cigarettes    Start date: 10/11/1968    Quit date: 10/11/1988    Years since quitting: 34.5   Smokeless tobacco: Never  Vaping Use   Vaping status: Never Used  Substance and Sexual Activity   Alcohol use: No    Alcohol/week: 0.0 standard drinks of alcohol    Comment: Quit drinking in 1990    Drug use: No   Sexual activity: Not Currently  Other Topics Concern   Not on file  Social History Narrative   Drinks about 2 cups of coffee a day, occasional diet pepsi.    Social Determinants of Health   Financial Resource Strain: Not on file  Food Insecurity: Not on file  Transportation Needs: Not on file  Physical Activity: Not on file  Stress: Not on file  Social Connections: Not on file   Family History  Problem Relation Age of Onset   Diabetes Mother    Varicose Veins Mother    Heart attack Father 41       "massive heart attack"   Cancer Sister    Diabetes Sister    Hypertension Sister    Varicose Veins Sister    Diabetes Brother    Hypertension Brother    Allergies  Allergen Reactions   Dulaglutide     Other reaction(s): localized site reaction, dyspepsia   Insulin Degludec     Other reaction(s): Itching and swelling in hands   Gabapentin Other (See Comments)    Ataxia   Morphine And Codeine Itching        Naproxen Other (See Comments)    Neurological reaction. Tremors, hallucinates    Current Outpatient Medications  Medication Sig Dispense Refill   acetaminophen (TYLENOL) 325 MG tablet Take 650 mg by mouth every 6 (six) hours as needed for moderate pain or headache.      aspirin 81 MG tablet Take 1 tablet (81 mg total) by mouth daily.     atorvastatin (LIPITOR) 20 MG tablet Take 20 mg by mouth daily.     augmented betamethasone dipropionate (DIPROLENE-AF) 0.05 % cream as needed for itching.     chlorhexidine (PERIDEX) 0.12 % solution SWISH WITH 1-2 OUNCE FOR 30 SECONDS THEN SPIT TWICE DAILY as needed for flare up  0   chlorpheniramine-HYDROcodone (TUSSIONEX) 10-8 MG/5ML SUER as needed for cough.     clopidogrel (PLAVIX) 75 MG tablet Take 1 tablet (75 mg total) by mouth daily. 90 tablet 0   colchicine 0.6 MG tablet Take 0.6 mg by mouth daily as needed (gout).      Cyanocobalamin (B-12) 5000 MCG CAPS Take 5,000 mcg by mouth daily.     dorzolamide-timolol  (COSOPT) 22.3-6.8 MG/ML ophthalmic solution Place 1 drop into both eyes 2 (two) times daily.     empagliflozin (JARDIANCE) 25 MG TABS tablet Take 25 mg by mouth daily.     HUMALOG KWIKPEN 100 UNIT/ML KwikPen INJECT 12 UNITS WITH BREAKFAST AND LUNCH, 24 UNITS WITH SUPPER. ADD CORRECTION ISF 20, TARGET 100. MAX DOSE 100 UNITS PER DAY for 30 days     Insulin Pen Needle 31G X 8 MM MISC      isosorbide mononitrate (IMDUR) 60 MG 24 hr tablet Take 1 tablet (60 mg total) by mouth 2 (two) times daily. 180 tablet 0   LANTUS SOLOSTAR 100 UNIT/ML Solostar Pen SMARTSIG:40 Unit(s) SUB-Q Twice Daily     lidocaine (LIDODERM) 5 % Place 1 patch onto the skin daily. Remove & Discard patch within 12 hours or as directed by MD 30 patch 0   lisinopril (ZESTRIL) 5 MG tablet Take 1 tablet by mouth once daily 90 tablet 3   LUMIGAN 0.01 % SOLN Place 1 drop into the left eye at bedtime.     LYRICA 100 MG capsule Take 50 mg by mouth 2 (two) times daily as needed (1 in the AM and 2 in the PM). For neuropathic pain  2   Melatonin 5 MG TABS Take 5 mg by mouth at bedtime.     metFORMIN (GLUCOPHAGE) 1000 MG tablet Take 1,000 mg by mouth 2 (two) times daily with a meal.     metoprolol tartrate (LOPRESSOR) 100 MG tablet Take 1 tablet by mouth twice daily 180 tablet 0   Multiple Vitamin (MULTI VITAMIN) TABS daily.     nitroGLYCERIN (NITROSTAT) 0.4 MG SL tablet DISSOLVE ONE TABLET UNDER THE TONGUE  EVERY 5 MINUTES AS NEEDED FOR CHEST PAIN. 25 tablet 0   Omega-3 Fatty Acids (FISH OIL PO) Take 520 mg by mouth daily.     pantoprazole (PROTONIX) 40 MG tablet Take 1 tablet by mouth twice daily 180 tablet 0   Probiotic Product (PROBIOTIC PO) Take 1 tablet by mouth daily.     pyridoxine (B-6) 100 MG tablet Take 100 mg by mouth daily.     triamcinolone cream (KENALOG) 0.1 % Apply 1 application topically daily as needed (lichen planus). With cetaphil     No current facility-administered medications for this visit.   No results  found.  Review of Systems:   A ROS was performed including pertinent positives and negatives as documented in the HPI.  Physical Exam :   Constitutional: NAD and appears stated age Neurological: Alert and oriented Psych: Appropriate affect and cooperative There were no vitals taken for this visit.   Comprehensive Musculoskeletal Exam:    Examination of bilateral knees reveals no evidence of erythema, ecchymosis, warmth.  Tenderness palpation over medial and lateral joint lines bilaterally.  Active range of motion from 0 to 100 degrees with positive crepitus.  Imaging:     Assessment:   82 y.o. male with moderate bilateral knee osteoarthritis.  His last round of cortisone injections only provided 4 to 6 weeks of relief.  He is here today for bilateral Orthovisc injections.  Discussed in detail the expectations, risks, and benefits.  Patient would like to proceed with injections and these were performed today in clinic without any complication and he tolerated this well.  Will plan to have him return next week for the second round.   Plan:    -Bilateral knee Orthovisc injections performed today after patient consent     Procedure Note  Patient: Seth Young             Date of Birth: 20-Aug-1940           MRN: [id number]             Visit Date: 05/05/2023  Procedures: Visit Diagnoses:  1. Primary osteoarthritis of both knees      Large Joint Inj: bilateral knee on 05/05/2023 2:52 PM Indications: pain Details: 22 G 1.5 in needle, anterolateral approach Medications (Right): 30 mg Hyaluronan 30 MG/2ML Medications (Left): 30 mg Hyaluronan 30 MG/2ML Outcome: tolerated well, no immediate complications Procedure, treatment alternatives, risks and benefits explained, specific risks discussed. Consent was given by the patient. Immediately prior to procedure a time out was called to verify the correct patient, procedure, equipment, support staff and site/side marked as required.  Patient was prepped and draped in the usual sterile fashion.      I personally saw and evaluated the patient, and participated in the management and treatment plan.   Hazle Nordmann, PA-C Orthopedics

## 2023-05-11 ENCOUNTER — Ambulatory Visit (HOSPITAL_BASED_OUTPATIENT_CLINIC_OR_DEPARTMENT_OTHER): Payer: Medicare Other | Admitting: Student

## 2023-05-11 DIAGNOSIS — J449 Chronic obstructive pulmonary disease, unspecified: Secondary | ICD-10-CM | POA: Diagnosis not present

## 2023-05-11 DIAGNOSIS — E1129 Type 2 diabetes mellitus with other diabetic kidney complication: Secondary | ICD-10-CM | POA: Diagnosis not present

## 2023-05-12 ENCOUNTER — Ambulatory Visit (HOSPITAL_BASED_OUTPATIENT_CLINIC_OR_DEPARTMENT_OTHER): Payer: Medicare Other | Admitting: Student

## 2023-05-12 ENCOUNTER — Encounter (HOSPITAL_BASED_OUTPATIENT_CLINIC_OR_DEPARTMENT_OTHER): Payer: Self-pay | Admitting: Student

## 2023-05-12 DIAGNOSIS — M170 Bilateral primary osteoarthritis of knee: Secondary | ICD-10-CM | POA: Diagnosis not present

## 2023-05-12 MED ORDER — HYALURONAN 30 MG/2ML IX SOSY
30.0000 mg | PREFILLED_SYRINGE | INTRA_ARTICULAR | Status: AC | PRN
Start: 2023-05-12 — End: 2023-05-12
  Administered 2023-05-12: 30 mg via INTRA_ARTICULAR

## 2023-05-12 MED ORDER — HYALURONAN 30 MG/2ML IX SOSY
30.0000 mg | PREFILLED_SYRINGE | Freq: Once | INTRA_ARTICULAR | Status: DC
Start: 2023-05-12 — End: 2023-05-19

## 2023-05-12 NOTE — Progress Notes (Signed)
     HPI: Patient is here today for the second round of bilateral Orthovisc injections.  He states that he tolerated these well after last week.  Continues to have some popping sensations with movement.  Has not noticed any significant benefit at this point.   Physical Exam:  No edema, erythema, ecchymosis, or warmth noted of bilateral knees.  Crepitus present with knee ROM.   Procedure Note  Patient: Seth Young             Date of Birth: March 20, 1941           MRN: [id number]             Visit Date: 05/12/2023  Procedures: Visit Diagnoses:  1. Primary osteoarthritis of both knees     Large Joint Inj: bilateral knee on 05/12/2023 1:45 PM Indications: pain Details: 22 G 1.5 in needle, anterolateral approach Medications (Right): 30 mg Hyaluronan 30 MG/2ML Medications (Left): 30 mg Hyaluronan 30 MG/2ML Outcome: tolerated well, no immediate complications Procedure, treatment alternatives, risks and benefits explained, specific risks discussed. Consent was given by the patient. Immediately prior to procedure a time out was called to verify the correct patient, procedure, equipment, support staff and site/side marked as required. Patient was prepped and draped in the usual sterile fashion.       Plan: Return to clinic in 1 week for Orthovisc round 3      Hazle Nordmann, PA-C Orthopedics

## 2023-05-14 DIAGNOSIS — E1129 Type 2 diabetes mellitus with other diabetic kidney complication: Secondary | ICD-10-CM | POA: Diagnosis not present

## 2023-05-19 ENCOUNTER — Ambulatory Visit (HOSPITAL_BASED_OUTPATIENT_CLINIC_OR_DEPARTMENT_OTHER): Payer: Medicare Other | Admitting: Student

## 2023-05-19 ENCOUNTER — Encounter (HOSPITAL_BASED_OUTPATIENT_CLINIC_OR_DEPARTMENT_OTHER): Payer: Self-pay | Admitting: Student

## 2023-05-19 DIAGNOSIS — M170 Bilateral primary osteoarthritis of knee: Secondary | ICD-10-CM

## 2023-05-19 MED ORDER — HYALURONAN 30 MG/2ML IX SOSY: 30.0000 mg | PREFILLED_SYRINGE | Freq: Once | INTRA_ARTICULAR | Status: DC

## 2023-05-19 NOTE — Progress Notes (Signed)
     HPI:  Patient is here today for his third round of Orthovisc injections.  He states that he has not had any issues after previous injections.  Does not notice any benefits as of today.   Physical Exam:  No edema, erythema, ecchymosis, or warmth noted of bilateral knees. Crepitus present with knee ROM.   Procedure Note  Patient: Seth Young             Date of Birth: 1941/03/14           MRN: [id number]             Visit Date: 05/19/2023  Procedures: Visit Diagnoses:  1. Primary osteoarthritis of both knees     Large Joint Inj: bilateral knee on 05/19/2023 5:16 PM Indications: pain Details: 22 G 1.5 in needle, anterolateral approach Outcome: tolerated well, no immediate complications Procedure, treatment alternatives, risks and benefits explained, specific risks discussed. Consent was given by the patient. Immediately prior to procedure a time out was called to verify the correct patient, procedure, equipment, support staff and site/side marked as required. Patient was prepped and draped in the usual sterile fashion.      Plan: Return to clinic as needed    I personally saw and evaluated the patient, and participated in the management and treatment plan.   Hazle Nordmann, PA-C Orthopedics

## 2023-06-09 ENCOUNTER — Other Ambulatory Visit: Payer: Self-pay | Admitting: Cardiology

## 2023-06-09 DIAGNOSIS — R079 Chest pain, unspecified: Secondary | ICD-10-CM

## 2023-06-23 ENCOUNTER — Other Ambulatory Visit: Payer: Self-pay

## 2023-06-23 ENCOUNTER — Ambulatory Visit (HOSPITAL_BASED_OUTPATIENT_CLINIC_OR_DEPARTMENT_OTHER): Payer: Medicare Other | Attending: Internal Medicine | Admitting: Physical Therapy

## 2023-06-23 ENCOUNTER — Encounter (HOSPITAL_BASED_OUTPATIENT_CLINIC_OR_DEPARTMENT_OTHER): Payer: Self-pay | Admitting: Physical Therapy

## 2023-06-23 DIAGNOSIS — M25562 Pain in left knee: Secondary | ICD-10-CM | POA: Diagnosis not present

## 2023-06-23 DIAGNOSIS — G8929 Other chronic pain: Secondary | ICD-10-CM | POA: Diagnosis not present

## 2023-06-23 DIAGNOSIS — M25561 Pain in right knee: Secondary | ICD-10-CM | POA: Insufficient documentation

## 2023-06-23 DIAGNOSIS — M5459 Other low back pain: Secondary | ICD-10-CM | POA: Diagnosis not present

## 2023-06-23 DIAGNOSIS — R262 Difficulty in walking, not elsewhere classified: Secondary | ICD-10-CM | POA: Insufficient documentation

## 2023-06-23 DIAGNOSIS — M6281 Muscle weakness (generalized): Secondary | ICD-10-CM | POA: Diagnosis not present

## 2023-06-23 NOTE — Therapy (Addendum)
OUTPATIENT PHYSICAL THERAPY LOWER EXTREMITY EVALUATION   Patient Name: Seth Young MRN: [id number] DOB:[birth year], 82 y.o., male Today's Date: 06/24/2023  END OF SESSION: PT End of Session - 06/23/23    Visit Number 1   Number of Visits 10   Date for PT Re-Evaluation 08/18/2023   Authorization Type [employer] MCR   PT Start Time  1320   PT Stop Time 1405   PT Time Calculation (min) 45   Activity Tolerance Patient tolerated treatment well   Behavior During Therapy  WFL for tasks assessed/performed      Past Medical History:  Diagnosis Date   Arthritis    "knees, toes, hands, probably in my back" (03/09/2018)   Ataxia    Basal cell carcinoma    left temporal area-no residual   Colon polyps 10-12-11   past hx.   Coronary artery disease    Exertional angina (HCC) 06/26/2014   Cath with DES to the OM, Bioflow protocol    GERD (gastroesophageal reflux disease)    H/O hiatal hernia    no problems   High cholesterol    History of echocardiogram    Echo 7/19:  Mild LVH, mild focal basal septal hypertrophy, EF 60-65, no RWMA, Gr 1 DD, trivial AI, MAC   Hypertension    Ischemic chest pain (HCC) 08/07/2014   Occasional tremors    Bilateral hands   Peripheral neuropathy    Pneumonia 1940's X 2; ~ 2017   "infant; walking pneumonia" (03/09/2018)   Pseudogout    "it moves around"   Sleep apnea    no cpap used   Type II diabetes mellitus (HCC) dx'd 1982   nsulin started ~ 2000   Vertigo    Past Surgical History:  Procedure Laterality Date   BACK SURGERY     BASAL CELL CARCINOMA EXCISION Left ~ 2012   face   CARDIAC CATHETERIZATION  02/2005   Dr. Patty Sermons; negative.   CARDIAC CATHETERIZATION N/A 05/13/2016   Procedure: Left Heart Cath and Coronary Angiography;  Surgeon: Jake Bathe, MD;  Location: [facility];  Service: Cardiovascular;  Laterality: N/A;   CARPAL TUNNEL RELEASE Left 10/2009   CARPAL TUNNEL RELEASE Right ~ 2013   CATARACT EXTRACTION W/ INTRAOCULAR LENS  IMPLANT Left 2000's   CATARACT EXTRACTION W/ INTRAOCULAR LENS IMPLANT Right 2015   COLONOSCOPY W/ POLYPECTOMY     CORONARY ANGIOPLASTY WITH STENT PLACEMENT  06/26/2014   "1"   CORONARY ANGIOPLASTY WITH STENT PLACEMENT  06/26/2014   pLAD 50%, d LAD 60%, oD1 80%,  mD1  70%, CFX 50%, OM 2 70%,  RCA 80%, PDA 95% (<41mm), OM1 99%-0% with Bio flow stent        CORONARY ANGIOPLASTY WITH STENT PLACEMENT  03/09/2018   CORONARY STENT INTERVENTION N/A 03/09/2018   Procedure: CORONARY STENT INTERVENTION;  Surgeon: Kathleene Hazel, MD;  Location: [facility];  Service: Cardiovascular;  Laterality: N/A;   ELBOW SURGERY Bilateral ~ 2014   "for blockages; Dr. Amanda Pea"   ENDARTERECTOMY Left 04/23/2015   Procedure: ENDARTERECTOMY CAROTID;  Surgeon: Fransisco Hertz, MD;  Location: [facility] OR;  Service: Vascular;  Laterality: Left;   EYE SURGERY Left    "related go diabetes; laser"   KNEE ARTHROSCOPY Left 05/2011   LEFT HEART CATH AND CORONARY ANGIOGRAPHY N/A 03/09/2018   Procedure: LEFT HEART CATH AND CORONARY ANGIOGRAPHY;  Surgeon: Kathleene Hazel, MD;  Location: [facility];  Service: Cardiovascular;  Laterality: N/A;  LEFT HEART CATHETERIZATION WITH CORONARY ANGIOGRAM N/A 06/26/2014   Procedure: LEFT HEART CATHETERIZATION WITH CORONARY ANGIOGRAM;  Surgeon: Micheline Chapman, MD;  Location: [facility] CATH LAB;  Service: Cardiovascular;  Laterality: N/A;   LEFT HEART CATHETERIZATION WITH CORONARY ANGIOGRAM N/A 08/07/2014   Procedure: LEFT HEART CATHETERIZATION WITH CORONARY ANGIOGRAM;  Surgeon: Micheline Chapman, MD;  Location: [facility] CATH LAB;  Service: Cardiovascular;  Laterality: N/A;   LUMBAR LAMINECTOMY/DECOMPRESSION MICRODISCECTOMY Left 11/02/2017   Procedure: MICRODISCECTOMY LUMBAR THREE- LUMBAR FOUR, LEFT;  Surgeon: Coletta Memos, MD;  Location: [facility] OR;  Service: Neurosurgery;  Laterality: Left;   NASAL SEPTUM SURGERY  1979   PATCH ANGIOPLASTY Left 04/23/2015   Procedure: PATCH ANGIOPLASTY USING 1CM  X 6CM XENOSURE BIOLOGIC PATCH;  Surgeon: Fransisco Hertz, MD;  Location: [facility] OR;  Service: Vascular;  Laterality: Left;   POSTERIOR LUMBAR FUSION  1964   SHOULDER OPEN ROTATOR CUFF REPAIR  10/14/2011   Procedure: ROTATOR CUFF REPAIR SHOULDER OPEN;  Surgeon: Javier Docker, MD;  Location: [facility];  Service: Orthopedics;  Laterality: Right;   SUBACROMIAL DECOMPRESSION  10/14/2011   Procedure: SUBACROMIAL DECOMPRESSION;  Surgeon: Javier Docker, MD;  Location: [facility];  Service: Orthopedics;  Laterality: Right;   TRIGGER FINGER RELEASE Right ~ 2013-2014   "3rd & 4th digits"   Patient Active Problem List   Diagnosis Date Noted   Coronary artery disease involving native coronary artery of native heart with angina pectoris (HCC) 03/07/2018   HNP (herniated nucleus pulposus), lumbar 11/02/2017   Carotid artery disease without cerebral infarction (HCC) 04/09/2015   Chest pain of uncertain etiology 06/21/2014   Diabetic neuropathy (HCC) 06/21/2014   Essential hypertension 06/21/2014   Hypercholesterolemia 06/21/2014   Rotator cuff tear 10/15/2011   IDDM (insulin dependent diabetes mellitus) 10/16/2008    PCP: Geoffry Paradise, MD   REFERRING PROVIDER: Geoffry Paradise, MD   REFERRING DIAG: G89.29 other chronic pain, M54.40 Lumbago with sciatica, unspecified, M25.562 (ICD-10-CM) - Pain in left knee M25.561 (ICD-10-CM) - Pain in right knee  THERAPY DIAG:  Chronic pain of both knees  Other low back pain  Muscle weakness (generalized)  Difficulty in walking, not elsewhere classified  Rationale for Evaluation and Treatment: Rehabilitation  ONSET DATE: PT order 05/11/2022 / Surgery March 2022   SUBJECTIVE:   SUBJECTIVE STATEMENT: Pt received PT earlier this year and completed PT in July.  Pt performed some of his HEP.  Pt performed some swimming and his stationary bike at home.  He states he didn't keep up with the exercises like he should.   Pt began a series of gel injections in October  though only had 1 injection.  He doesn't like the way it makes his knees feel.  He states he is not going to have anymore gel injections.  Pt reports having difficulty and limitations with walking and turning.  Pt states he is more dependent on crutch.  Pt states his balance is off. He feels like his walking is not making progress.  His knees are weak with walking.  Pt has back pain with standing.    PERTINENT HISTORY: -3 back surgeries:  L3-5 laminotomy and pt thinks he has some screws in lumbar March 2022, Lumbar laminectomy/decompression/microdisctomy in 2019, Lumbar surgery in 1964   -OA, Bilat knee pain R > L, CAD, Peripheral neuropathy, Vertigo, Pseudogout, DM type II and diabetic retinopathy,    -Pt has Insulin monitor and tremors in bilat Ue's R > L   -PSHx:  R shoulder  surgery in 2013 and was unable to repair rotator cuff, Coronary angioplasty with stent placement in 2015 and 2019, and L knee arthroscopy in 2012  PAIN:   Lumbar:  0/10 pain at rest, 7/10 worst  Knee pain:  4/10 current, 7-8/10 worst, 0/10 best  PRECAUTIONS: Other: multiple back surgeries, peripheral neuropathy, R shoulder RCR   WEIGHT BEARING RESTRICTIONS: No  FALLS:  Has patient fallen in last 6 months? No  LIVING ENVIRONMENT: Lives with: lives with their spouse Lives in: 1 story home Stairs: 3-4 steps with rail Has following equipment at home: Single point cane, Environmental consultant - 2 wheeled, and Crutches  OCCUPATION: Pt works from home.   PLOF: Independent; Pt began using SPC in early 2019.  Pt was able to perform daily mobility and daily activities with greater ease and less difficulty   PATIENT GOALS: to improve strength in legs and get rid of his crutch   OBJECTIVE:  Note: Objective measures were completed at Evaluation unless otherwise noted.  DIAGNOSTIC FINDINGS:  L knee x ray in 11/23: IMPRESSION: 1. Severe patellofemoral and mild-to-moderate medial and lateral compartment osteoarthritis. 2. Small  joint effusion.  R knee x ray in 11/23: IMPRESSION: 1. Severe patellofemoral and mild to moderate medial and lateral compartment osteoarthritis. 2. Small joint effusion.  Lumbar: FINDINGS: Segmentation:  Standard.   Alignment:  Trace anterolisthesis L3 on L4.   Vertebrae: No fracture, evidence of discitis, or bone lesion. There is some degenerative endplate signal change at L3-4.   Conus medullaris and cauda equina: Conus extends to the T12 level. Conus and cauda equina appear normal.   Paraspinal and other soft tissues: Negative.   Disc levels:   T11-12 and T12-L1 are imaged in the sagittal plane only and negative.   L1-2: Minimal disc bulge without stenosis.   L2-3: Shallow disc bulge and mild ligamentum flavum thickening. Mild central canal and left foraminal narrowing appear unchanged. The right foramen is open.   L3-4: Status post left laminotomy. There has been progression of disease at this level. A new and very large central left paracentral disc protrusion extends into the left foramen. There is severe compression of the thecal sac and narrowing in the lateral recesses, worse on the left. Moderate bilateral foraminal narrowing is worse on the left.   L4-5: Status post left laminotomy as seen on the prior exam. There is a shallow broad-based disc bulge. The narrowing in the subarticular recesses is present which could impact either descending L5 root. Mild bilateral foraminal narrowing. No change.   L5-S1: A right subarticular recess protrusion impinging on the right S1 root appears unchanged. Foramina are open.   IMPRESSION: Since the prior MRI, the patient has developed a large central and left paracentral disc protrusion at L3-4 causing severe compression of the thecal sac and narrowing in the lateral recesses, worse on the left. Moderate bilateral foraminal narrowing at this level is worse on the left. Prior left laminotomy noted.   No change in a  broad-based disc bulge at L4-5 causing narrowing in the subarticular recesses which could impact either descending L5 root. Left laminotomy defect is seen at this level.   No change in a right subarticular recess protrusion at L5-S1. The disc appears to impinge on the right S1 root.  PATIENT SURVEYS:  FOTO 47 with a goal of 58 at visit 14  COGNITION: Overall cognitive status: Within functional limits for tasks assessed      LOWER EXTREMITY ROM:  Active ROM Right eval Left  eval  Hip flexion    Hip extension    Hip abduction    Hip adduction    Hip internal rotation    Hip external rotation    Knee flexion 118 126  Knee extension limited limited  Ankle dorsiflexion    Ankle plantarflexion    Ankle inversion    Ankle eversion     (Blank rows = not tested)  LOWER EXTREMITY MMT:  MMT Right eval Left eval  Hip flexion 32.1 27.4  Hip extension    Hip abduction 22.6 32.5  Hip adduction    Hip internal rotation    Hip external rotation    Knee flexion 20.4 seated 27.2 seated  Knee extension 4+/5; 37.4 4+/5; 40.8  Ankle dorsiflexion    Ankle plantarflexion    Ankle inversion    Ankle eversion     (Blank rows = not tested)   FUNCTIONAL TESTS:   6 MWT:  Prior PT:  Pt ambulated 547 ft with SPC.  Pt had no LOB.  Pt stopped at 4 min and 30 sec. Current:  Pt ambulated 220 ft with lofstrand crutch.  Pt stopped at 2 min 30 sec.  GAIT: Assistive device utilized:  1 lofstrand crutch  Comments: slow gait, decreased TKE bilat, decreased step length bilat   TODAY'S TREATMENT:                                                                                                                               See below for pt education  PATIENT EDUCATION:  Education details: objective findings and in comparison to prior PT, rationale of interventions, importance of performing HEP, dx, and POC. Person educated: Patient Education method: Explanation Education comprehension:  verbalized understanding  HOME EXERCISE PROGRAM: Pt already has a HEP from prior PT.  Access Code: [email or web address]   ASSESSMENT:  CLINICAL IMPRESSION: Patient is an 82 y.o. male with dx's of chronic pain, Lumbago with sciatic, and bilat knee pain.  Pt received PT earlier this year and finished it in July.  He has been performing some of his HEP, but not as much as he should.  Pt has strength deficits in bilat LE's and is weaker in bilat LE's compared to prior PT.  He continues to be limited with ambulation distance and his ambulation distance and duration has worsened since prior PT.  Pt reports his knees are weak with walking and has difficulty with turning.  Pt states he is more dependent on crutch.  He has back pain with standing.  Pt should benefit from skilled PT services to address impairments and improve overall function.      OBJECTIVE IMPAIRMENTS: Abnormal gait, decreased activity tolerance, decreased balance, decreased endurance, decreased mobility, difficulty walking, decreased ROM, decreased strength, hypomobility, impaired flexibility, and pain.   ACTIVITY LIMITATIONS: standing, squatting, stairs, transfers, and locomotion level  PARTICIPATION LIMITATIONS: meal prep, cleaning, shopping, and community activity  PERSONAL FACTORS: Time since onset of  injury/illness/exacerbation and 3+ comorbidities:  multiple back surgeries, OA, Peripheral neuropathy, Pseudogout, DM type II  are also affecting patient's functional outcome.   REHAB POTENTIAL: Good   CLINICAL DECISION MAKING: Evolving/moderate complexity   EVALUATION COMPLEXITY: Moderate   GOALS:   SHORT TERM GOALS: Target date: 07/14/2023  Pt will report improved compliance with HEP for improved strength, pain, mobility, and function.  Baseline: Goal status: INITIAL  2.  Pt will report at least a 40% improvement in performance of and tolerance with functional mobility.  Baseline:  Goal status: INITIAL  3.  Pt will increase  ambulation distance by at least 100 ft for improved tolerance with community ambulation.  Baseline:  Goal status: INITIAL    LONG TERM GOALS: Target date: 08/18/2023  Pt will be able to turn with gait with improved ease and good stability.  Baseline:  Goal status: INITIAL  2.  Pt will report at least a 50% improvement in tolerance with standing.  Baseline:  Goal status: INITIAL  3.  Pt will demo at least an 8-10# increase in bilat hip flex and abd strength, 5-10# increase in knee flexion, and 5/5 in knee extension bilat strength for improved performance of and tolerance with functional mobility.  Baseline:  Goal status: INITIAL  4.  Pt will be able to ambulate > 600 ft on 6 MWT for improved community ambulation.   Baseline:  Goal status: INITIAL     PLAN:  PT FREQUENCY: 1-2x/week  PT DURATION: 8 weeks  PLANNED INTERVENTIONS: Therapeutic exercises, Therapeutic activity, Neuromuscular re-education, Balance training, Gait training, Patient/Family education, Self Care, Joint mobilization, Stair training, Aquatic Therapy, Dry Needling, Electrical stimulation, Cryotherapy, Moist heat, Taping, Manual therapy, and Re-evaluation.   PLAN FOR NEXT SESSION: review and perform HEP.  LE strengthening, gait, and improve standing tolerance.    Audie Clear III PT, DPT 06/24/23 10:28 PM

## 2023-06-29 ENCOUNTER — Encounter (HOSPITAL_BASED_OUTPATIENT_CLINIC_OR_DEPARTMENT_OTHER): Payer: Medicare Other

## 2023-07-20 ENCOUNTER — Ambulatory Visit (HOSPITAL_BASED_OUTPATIENT_CLINIC_OR_DEPARTMENT_OTHER): Payer: Medicare Other | Attending: Internal Medicine | Admitting: Physical Therapy

## 2023-07-20 DIAGNOSIS — M6281 Muscle weakness (generalized): Secondary | ICD-10-CM | POA: Insufficient documentation

## 2023-07-20 DIAGNOSIS — R262 Difficulty in walking, not elsewhere classified: Secondary | ICD-10-CM | POA: Insufficient documentation

## 2023-07-20 DIAGNOSIS — N1831 Chronic kidney disease, stage 3a: Secondary | ICD-10-CM | POA: Diagnosis not present

## 2023-07-20 DIAGNOSIS — E1165 Type 2 diabetes mellitus with hyperglycemia: Secondary | ICD-10-CM | POA: Diagnosis not present

## 2023-07-20 DIAGNOSIS — M5459 Other low back pain: Secondary | ICD-10-CM | POA: Diagnosis not present

## 2023-07-20 DIAGNOSIS — M25561 Pain in right knee: Secondary | ICD-10-CM | POA: Insufficient documentation

## 2023-07-20 DIAGNOSIS — G8929 Other chronic pain: Secondary | ICD-10-CM | POA: Diagnosis not present

## 2023-07-20 DIAGNOSIS — Z9989 Dependence on other enabling machines and devices: Secondary | ICD-10-CM | POA: Diagnosis not present

## 2023-07-20 DIAGNOSIS — M25562 Pain in left knee: Secondary | ICD-10-CM | POA: Insufficient documentation

## 2023-07-20 DIAGNOSIS — I2510 Atherosclerotic heart disease of native coronary artery without angina pectoris: Secondary | ICD-10-CM | POA: Diagnosis not present

## 2023-07-20 NOTE — Therapy (Signed)
OUTPATIENT PHYSICAL THERAPY TREATMENT   Patient Name: Seth Young MRN: [id number] DOB:10/06/1940, 82 y.o., male Today's Date: 07/21/2023  END OF SESSION:  PT End of Session - 07/20/23      Visit Number 2    Number of Visits 10    Date for PT Re-Evaluation 08/18/2023    Authorization Type BC[employer] MCR    PT Start Time  1322    PT Stop Time 1402    PT Time Calculation (min) 40    Activity Tolerance Patient tolerated treatment well    Behavior During Therapy  WFL for tasks assessed/performed     Past Medical History:  Diagnosis Date   Arthritis    "knees, toes, hands, probably in my back" (03/09/2018)   Ataxia    Basal cell carcinoma    left temporal area-no residual   Colon polyps 10-12-11   past hx.   Coronary artery disease    Exertional angina (HCC) 06/26/2014   Cath with DES to the OM, Bioflow protocol    GERD (gastroesophageal reflux disease)    H/O hiatal hernia    no problems   High cholesterol    History of echocardiogram    Echo 7/19:  Mild LVH, mild focal basal septal hypertrophy, EF 60-65, no RWMA, Gr 1 DD, trivial AI, MAC   Hypertension    Ischemic chest pain (HCC) 08/07/2014   Occasional tremors    Bilateral hands   Peripheral neuropathy    Pneumonia 1940's X 2; ~ 2017   "infant; walking pneumonia" (03/09/2018)   Pseudogout    "it moves around"   Sleep apnea    no cpap used   Type II diabetes mellitus (HCC) dx'd 1982   nsulin started ~ 2000   Vertigo    Past Surgical History:  Procedure Laterality Date   BACK SURGERY     BASAL CELL CARCINOMA EXCISION Left ~ 2012   face   CARDIAC CATHETERIZATION  02/2005   Dr. Patty Sermons; negative.   CARDIAC CATHETERIZATION N/A 05/13/2016   Procedure: Left Heart Cath and Coronary Angiography;  Surgeon: Jake Bathe, MD;  Location: [facility];  Service: Cardiovascular;  Laterality: N/A;   CARPAL TUNNEL RELEASE Left 10/2009   CARPAL TUNNEL RELEASE Right ~ 2013   CATARACT EXTRACTION W/ INTRAOCULAR LENS IMPLANT  Left 2000's   CATARACT EXTRACTION W/ INTRAOCULAR LENS IMPLANT Right 2015   COLONOSCOPY W/ POLYPECTOMY     CORONARY ANGIOPLASTY WITH STENT PLACEMENT  06/26/2014   "1"   CORONARY ANGIOPLASTY WITH STENT PLACEMENT  06/26/2014   pLAD 50%, d LAD 60%, oD1 80%,  mD1  70%, CFX 50%, OM 2 70%,  RCA 80%, PDA 95% (<103mm), OM1 99%-0% with Bio flow stent        CORONARY ANGIOPLASTY WITH STENT PLACEMENT  03/09/2018   CORONARY STENT INTERVENTION N/A 03/09/2018   Procedure: CORONARY STENT INTERVENTION;  Surgeon: Kathleene Hazel, MD;  Location: [facility];  Service: Cardiovascular;  Laterality: N/A;   ELBOW SURGERY Bilateral ~ 2014   "for blockages; Dr. Amanda Pea"   ENDARTERECTOMY Left 04/23/2015   Procedure: ENDARTERECTOMY CAROTID;  Surgeon: Fransisco Hertz, MD;  Location: [facility] OR;  Service: Vascular;  Laterality: Left;   EYE SURGERY Left    "related go diabetes; laser"   KNEE ARTHROSCOPY Left 05/2011   LEFT HEART CATH AND CORONARY ANGIOGRAPHY N/A 03/09/2018   Procedure: LEFT HEART CATH AND CORONARY ANGIOGRAPHY;  Surgeon: Kathleene Hazel, MD;  Location: [facility];  Service: Cardiovascular;  Laterality: N/A;   LEFT HEART CATHETERIZATION WITH CORONARY ANGIOGRAM N/A 06/26/2014   Procedure: LEFT HEART CATHETERIZATION WITH CORONARY ANGIOGRAM;  Surgeon: Micheline Chapman, MD;  Location: [facility] LAB;  Service: Cardiovascular;  Laterality: N/A;   LEFT HEART CATHETERIZATION WITH CORONARY ANGIOGRAM N/A 08/07/2014   Procedure: LEFT HEART CATHETERIZATION WITH CORONARY ANGIOGRAM;  Surgeon: Micheline Chapman, MD;  Location: [facility] CATH LAB;  Service: Cardiovascular;  Laterality: N/A;   LUMBAR LAMINECTOMY/DECOMPRESSION MICRODISCECTOMY Left 11/02/2017   Procedure: MICRODISCECTOMY LUMBAR THREE- LUMBAR FOUR, LEFT;  Surgeon: Coletta Memos, MD;  Location: [facility] OR;  Service: Neurosurgery;  Laterality: Left;   NASAL SEPTUM SURGERY  1979   PATCH ANGIOPLASTY Left 04/23/2015   Procedure: PATCH ANGIOPLASTY USING 1CM X 6CM  XENOSURE BIOLOGIC PATCH;  Surgeon: Fransisco Hertz, MD;  Location: [facility] OR;  Service: Vascular;  Laterality: Left;   POSTERIOR LUMBAR FUSION  1964   SHOULDER OPEN ROTATOR CUFF REPAIR  10/14/2011   Procedure: ROTATOR CUFF REPAIR SHOULDER OPEN;  Surgeon: Javier Docker, MD;  Location: [facility] ORS;  Service: Orthopedics;  Laterality: Right;   SUBACROMIAL DECOMPRESSION  10/14/2011   Procedure: SUBACROMIAL DECOMPRESSION;  Surgeon: Javier Docker, MD;  Location: [facility] OR[facility];  Service: Orthopedics;  Laterality: Right;   TRIGGER FINGER RELEASE Right ~ 2013-2014   "3rd & 4th digits"   Patient Active Problem List   Diagnosis Date Noted   Coronary artery disease involving native coronary artery of native heart with angina pectoris (HCC) 03/07/2018   HNP (herniated nucleus pulposus), lumbar 11/02/2017   Carotid artery disease without cerebral infarction (HCC) 04/09/2015   Chest pain of uncertain etiology 06/21/2014   Diabetic neuropathy (HCC) 06/21/2014   Essential hypertension 06/21/2014   Hypercholesterolemia 06/21/2014   Rotator cuff tear 10/15/2011   IDDM (insulin dependent diabetes mellitus) 10/16/2008    PCP: Geoffry Paradise, MD   REFERRING PROVIDER: Geoffry Paradise, MD   REFERRING DIAG: G89.29 other chronic pain, M54.40 Lumbago with sciatica, unspecified, M25.562 (ICD-10-CM) - Pain in left knee M25.561 (ICD-10-CM) - Pain in right knee  THERAPY DIAG:  Chronic pain of both knees  Other low back pain  Muscle weakness (generalized)  Difficulty in walking, not elsewhere classified  Rationale for Evaluation and Treatment: Rehabilitation  ONSET DATE: PT order 05/11/2022 / Surgery March 2022   SUBJECTIVE:   SUBJECTIVE STATEMENT: Pt states he has been using Calm Pro and cutting back on Lyrica and reports improved sx's with neuropathy.  PT instructed pt to inform MD and pt agreed.  Pt states he has been trying to perform more of his HEP.  He states the S/L clams are helping.  Pt also performing seated  HS stretch and supine core exercise. Pt states he doesn't want anymore gel injections in his knees.       PERTINENT HISTORY: -3 back surgeries:  L3-5 laminotomy and pt thinks he has some screws in lumbar March 2022, Lumbar laminectomy/decompression/microdisctomy in 2019, Lumbar surgery in 1964   -OA, Bilat knee pain R > L, CAD, Peripheral neuropathy, Vertigo, Pseudogout, DM type II and diabetic retinopathy,    -Pt has Insulin monitor and tremors in bilat Ue's R > L   -PSHx:  R shoulder surgery in 2013 and was unable to repair rotator cuff, Coronary angioplasty with stent placement in 2015 and 2019, and L knee arthroscopy in 2012  PAIN:   Lumbar:  0/10 pain currently, 7/10 worst  Knee pain:  not really pain, just discomfort; 7-8/10 worst,  0/10 best  PRECAUTIONS: Other: multiple back surgeries, peripheral neuropathy, R shoulder RCR   WEIGHT BEARING RESTRICTIONS: No  FALLS:  Has patient fallen in last 6 months? No  LIVING ENVIRONMENT: Lives with: lives with their spouse Lives in: 1 story home Stairs: 3-4 steps with rail Has following equipment at home: Single point cane, Environmental consultant - 2 wheeled, and Crutches  OCCUPATION: Pt works from home.   PLOF: Independent; Pt began using SPC in early 2019.  Pt was able to perform daily mobility and daily activities with greater ease and less difficulty   PATIENT GOALS: to improve strength in legs and get rid of his crutch   OBJECTIVE:  Note: Objective measures were completed at Evaluation unless otherwise noted.  DIAGNOSTIC FINDINGS:  L knee x ray in 11/23: IMPRESSION: 1. Severe patellofemoral and mild-to-moderate medial and lateral compartment osteoarthritis. 2. Small joint effusion.  R knee x ray in 11/23: IMPRESSION: 1. Severe patellofemoral and mild to moderate medial and lateral compartment osteoarthritis. 2. Small joint effusion.  Lumbar: FINDINGS: Segmentation:  Standard.   Alignment:  Trace anterolisthesis L3 on L4.    Vertebrae: No fracture, evidence of discitis, or bone lesion. There is some degenerative endplate signal change at L3-4.   Conus medullaris and cauda equina: Conus extends to the T12 level. Conus and cauda equina appear normal.   Paraspinal and other soft tissues: Negative.   Disc levels:   T11-12 and T12-L1 are imaged in the sagittal plane only and negative.   L1-2: Minimal disc bulge without stenosis.   L2-3: Shallow disc bulge and mild ligamentum flavum thickening. Mild central canal and left foraminal narrowing appear unchanged. The right foramen is open.   L3-4: Status post left laminotomy. There has been progression of disease at this level. A new and very large central left paracentral disc protrusion extends into the left foramen. There is severe compression of the thecal sac and narrowing in the lateral recesses, worse on the left. Moderate bilateral foraminal narrowing is worse on the left.   L4-5: Status post left laminotomy as seen on the prior exam. There is a shallow broad-based disc bulge. The narrowing in the subarticular recesses is present which could impact either descending L5 root. Mild bilateral foraminal narrowing. No change.   L5-S1: A right subarticular recess protrusion impinging on the right S1 root appears unchanged. Foramina are open.   IMPRESSION: Since the prior MRI, the patient has developed a large central and left paracentral disc protrusion at L3-4 causing severe compression of the thecal sac and narrowing in the lateral recesses, worse on the left. Moderate bilateral foraminal narrowing at this level is worse on the left. Prior left laminotomy noted.   No change in a broad-based disc bulge at L4-5 causing narrowing in the subarticular recesses which could impact either descending L5 root. Left laminotomy defect is seen at this level.   No change in a right subarticular recess protrusion at L5-S1. The disc appears to impinge on the  right S1 root.   TODAY'S TREATMENT:  Reviewed prior HEP. Pt performed: Nustep L3 just LE's x 7 mins S/L clams x 15 reps bilat Supine SLR with contralateral UE extension with TrA 2x10 reps Seated HS stretch 2x30 sec bilat Seated row with retraction with RTB 2x10 Standing heel raises 2x10 with TrA Sidestepping with bilat UE support on rail x 2 laps Marching on airex 2x10 with bilat UE support Standing on airex 2x30 sec with occasional UE support Seated clamshell with GTB 2x10 LAQ with 3# 2x10 bilat  Pt ambulated approx 250 feet with lofstrand crutch on R with cuing for posture and L arm swing.   PATIENT EDUCATION:  Education details: objective findings and in comparison to prior PT, rationale of interventions, importance of performing HEP, dx, and POC. Person educated: Patient Education method: Explanation Education comprehension: verbalized understanding  HOME EXERCISE PROGRAM: Pt already has a HEP from prior PT.  Access Code: [email or web address]   ASSESSMENT:  CLINICAL IMPRESSION: Patient is not compliant with HEP though is performing more of his HEP now than he was prior.  PT reviewed HEP and instructed pt in correct exercises.  Pt gives good effort with all exercises.  He performed exercises well with cuing and instruction in correct form.  PT worked on improving gait.  He does not swing L UE with gait.  PT provided cues for improved posture and reciprocal arm swing with gait.  Pt becomes fatigued with ambulating short distance.  He responded well to Rx having no c/o's after Rx.  Pt should benefit from continued skilled PT services to address impairments and improve overall function.       OBJECTIVE IMPAIRMENTS: Abnormal gait, decreased activity tolerance, decreased balance, decreased endurance, decreased mobility, difficulty walking, decreased ROM, decreased  strength, hypomobility, impaired flexibility, and pain.   ACTIVITY LIMITATIONS: standing, squatting, stairs, transfers, and locomotion level  PARTICIPATION LIMITATIONS: meal prep, cleaning, shopping, and community activity  PERSONAL FACTORS: Time since onset of injury/illness/exacerbation and 3+ comorbidities:  multiple back surgeries, OA, Peripheral neuropathy, Pseudogout, DM type II  are also affecting patient's functional outcome.   REHAB POTENTIAL: Good   CLINICAL DECISION MAKING: Evolving/moderate complexity   EVALUATION COMPLEXITY: Moderate   GOALS:   SHORT TERM GOALS: Target date: 07/14/2023  Pt will report improved compliance with HEP for improved strength, pain, mobility, and function.  Baseline: Goal status: INITIAL  2.  Pt will report at least a 40% improvement in performance of and tolerance with functional mobility.  Baseline:  Goal status: INITIAL  3.  Pt will increase ambulation distance by at least 100 ft for improved tolerance with community ambulation.  Baseline:  Goal status: INITIAL    LONG TERM GOALS: Target date: 08/18/2023  Pt will be able to turn with gait with improved ease and good stability.  Baseline:  Goal status: INITIAL  2.  Pt will report at least a 50% improvement in tolerance with standing.  Baseline:  Goal status: INITIAL  3.  Pt will demo at least an 8-10# increase in bilat hip flex and abd strength, 5-10# increase in knee flexion, and 5/5 in knee extension bilat strength for improved performance of and tolerance with functional mobility.  Baseline:  Goal status: INITIAL  4.  Pt will be able to ambulate > 600 ft on 6 MWT for improved community ambulation.   Baseline:  Goal status: INITIAL     PLAN:  PT FREQUENCY: 1-2x/week  PT DURATION: 8 weeks  PLANNED INTERVENTIONS: Therapeutic exercises, Therapeutic activity, Neuromuscular re-education, Balance training, Gait training, Patient/Family  education, Self Care, Joint  mobilization, Stair training, Aquatic Therapy, Dry Needling, Electrical stimulation, Cryotherapy, Moist heat, Taping, Manual therapy, and Re-evaluation.   PLAN FOR NEXT SESSION: review and perform HEP.  LE strengthening, gait, and improve standing tolerance.    Audie Clear III PT, DPT 07/21/23 10:04 PM

## 2023-07-21 ENCOUNTER — Encounter (HOSPITAL_BASED_OUTPATIENT_CLINIC_OR_DEPARTMENT_OTHER): Payer: Self-pay | Admitting: Physical Therapy

## 2023-07-27 ENCOUNTER — Ambulatory Visit (HOSPITAL_BASED_OUTPATIENT_CLINIC_OR_DEPARTMENT_OTHER): Payer: Medicare Other | Admitting: Physical Therapy

## 2023-07-27 ENCOUNTER — Encounter (HOSPITAL_BASED_OUTPATIENT_CLINIC_OR_DEPARTMENT_OTHER): Payer: Self-pay | Admitting: Physical Therapy

## 2023-07-27 DIAGNOSIS — M6281 Muscle weakness (generalized): Secondary | ICD-10-CM

## 2023-07-27 DIAGNOSIS — M5459 Other low back pain: Secondary | ICD-10-CM

## 2023-07-27 DIAGNOSIS — R262 Difficulty in walking, not elsewhere classified: Secondary | ICD-10-CM

## 2023-07-27 DIAGNOSIS — M25562 Pain in left knee: Secondary | ICD-10-CM | POA: Diagnosis not present

## 2023-07-27 DIAGNOSIS — G8929 Other chronic pain: Secondary | ICD-10-CM | POA: Diagnosis not present

## 2023-07-27 DIAGNOSIS — M25561 Pain in right knee: Secondary | ICD-10-CM | POA: Diagnosis not present

## 2023-07-27 NOTE — Therapy (Signed)
OUTPATIENT PHYSICAL THERAPY TREATMENT   Patient Name: Seth Young MRN: [id number] DOB:1940-10-20, 82 y.o., male Today's Date: 07/28/2023  END OF SESSION:  PT End of Session - 07/27/23 1451     Visit Number 3    Number of Visits 10    Date for PT Re-Evaluation 08/18/23    Authorization Type [employer] MCR    PT Start Time 1448    PT Stop Time 1530    PT Time Calculation (min) 42 min    Activity Tolerance Patient tolerated treatment well    Behavior During Therapy WFL for tasks assessed/performed                Past Medical History:  Diagnosis Date   Arthritis    "knees, toes, hands, probably in my back" (03/09/2018)   Ataxia    Basal cell carcinoma    left temporal area-no residual   Colon polyps 10-12-11   past hx.   Coronary artery disease    Exertional angina (HCC) 06/26/2014   Cath with DES to the OM, Bioflow protocol    GERD (gastroesophageal reflux disease)    H/O hiatal hernia    no problems   High cholesterol    History of echocardiogram    Echo 7/19:  Mild LVH, mild focal basal septal hypertrophy, EF 60-65, no RWMA, Gr 1 DD, trivial AI, MAC   Hypertension    Ischemic chest pain (HCC) 08/07/2014   Occasional tremors    Bilateral hands   Peripheral neuropathy    Pneumonia 1940's X 2; ~ 2017   "infant; walking pneumonia" (03/09/2018)   Pseudogout    "it moves around"   Sleep apnea    no cpap used   Type II diabetes mellitus (HCC) dx'd 1982   nsulin started ~ 2000   Vertigo    Past Surgical History:  Procedure Laterality Date   BACK SURGERY     BASAL CELL CARCINOMA EXCISION Left ~ 2012   face   CARDIAC CATHETERIZATION  02/2005   Dr. Patty Sermons; negative.   CARDIAC CATHETERIZATION N/A 05/13/2016   Procedure: Left Heart Cath and Coronary Angiography;  Surgeon: Jake Bathe, MD;  Location: [facility];  Service: Cardiovascular;  Laterality: N/A;   CARPAL TUNNEL RELEASE Left 10/2009   CARPAL TUNNEL RELEASE Right ~ 2013   CATARACT EXTRACTION W/  INTRAOCULAR LENS IMPLANT Left 2000's   CATARACT EXTRACTION W/ INTRAOCULAR LENS IMPLANT Right 2015   COLONOSCOPY W/ POLYPECTOMY     CORONARY ANGIOPLASTY WITH STENT PLACEMENT  06/26/2014   "1"   CORONARY ANGIOPLASTY WITH STENT PLACEMENT  06/26/2014   pLAD 50%, d LAD 60%, oD1 80%,  mD1  70%, CFX 50%, OM 2 70%,  RCA 80%, PDA 95% (<54mm), OM1 99%-0% with Bio flow stent        CORONARY ANGIOPLASTY WITH STENT PLACEMENT  03/09/2018   CORONARY STENT INTERVENTION N/A 03/09/2018   Procedure: CORONARY STENT INTERVENTION;  Surgeon: Kathleene Hazel, MD;  Location: [facility];  Service: Cardiovascular;  Laterality: N/A;   ELBOW SURGERY Bilateral ~ 2014   "for blockages; Dr. Amanda Pea"   ENDARTERECTOMY Left 04/23/2015   Procedure: ENDARTERECTOMY CAROTID;  Surgeon: Fransisco Hertz, MD;  Location: [facility] OR;  Service: Vascular;  Laterality: Left;   EYE SURGERY Left    "related go diabetes; laser"   KNEE ARTHROSCOPY Left 05/2011   LEFT HEART CATH AND CORONARY ANGIOGRAPHY N/A 03/09/2018   Procedure: LEFT HEART CATH AND CORONARY ANGIOGRAPHY;  Surgeon: Kathleene Hazel, MD;  Location: [facility] INVASIVE CV LAB;  Service: Cardiovascular;  Laterality: N/A;   LEFT HEART CATHETERIZATION WITH CORONARY ANGIOGRAM N/A 06/26/2014   Procedure: LEFT HEART CATHETERIZATION WITH CORONARY ANGIOGRAM;  Surgeon: Micheline Chapman, MD;  Location: [facility] CATH LAB;  Service: Cardiovascular;  Laterality: N/A;   LEFT HEART CATHETERIZATION WITH CORONARY ANGIOGRAM N/A 08/07/2014   Procedure: LEFT HEART CATHETERIZATION WITH CORONARY ANGIOGRAM;  Surgeon: Micheline Chapman, MD;  Location: [facility] CATH LAB;  Service: Cardiovascular;  Laterality: N/A;   LUMBAR LAMINECTOMY/DECOMPRESSION MICRODISCECTOMY Left 11/02/2017   Procedure: MICRODISCECTOMY LUMBAR THREE- LUMBAR FOUR, LEFT;  Surgeon: Coletta Memos, MD;  Location: [facility] OR;  Service: Neurosurgery;  Laterality: Left;   NASAL SEPTUM SURGERY  1979   PATCH ANGIOPLASTY Left 04/23/2015   Procedure: PATCH  ANGIOPLASTY USING 1CM X 6CM XENOSURE BIOLOGIC PATCH;  Surgeon: Fransisco Hertz, MD;  Location[facility] OR;  Service: Vascular;  Laterality: Left;   POSTERIOR LUMBAR FUSION  1964   SHOULDER OPEN ROTATOR CUFF REPAIR  10/14/2011   Procedure: ROTATOR CUFF REPAIR SHOULDER OPEN;  Surgeon: Javier Docker, MD;  Location: [facility] ORS;  Service: Orthopedics;  Laterality: Right;   SUBACROMIAL DECOMPRESSION  10/14/2011   Procedure: SUBACROMIAL DECOMPRESSION;  Surgeon: Javier Docker, MD;  Location: [facility] ORS;  Service: Orthopedics;  Laterality: Right;   TRIGGER FINGER RELEASE Right ~ 2013-2014   "3rd & 4th digits"   Patient Active Problem List   Diagnosis Date Noted   Coronary artery disease involving native coronary artery of native heart with angina pectoris (HCC) 03/07/2018   HNP (herniated nucleus pulposus), lumbar 11/02/2017   Carotid artery disease without cerebral infarction (HCC) 04/09/2015   Chest pain of uncertain etiology 06/21/2014   Diabetic neuropathy (HCC) 06/21/2014   Essential hypertension 06/21/2014   Hypercholesterolemia 06/21/2014   Rotator cuff tear 10/15/2011   IDDM (insulin dependent diabetes mellitus) 10/16/2008    PCP: Geoffry Paradise, MD   REFERRING PROVIDER: Geoffry Paradise, MD   REFERRING DIAG: G89.29 other chronic pain, M54.40 Lumbago with sciatica, unspecified, M25.562 (ICD-10-CM) - Pain in left knee M25.561 (ICD-10-CM) - Pain in right knee  THERAPY DIAG:  Chronic pain of both knees  Other low back pain  Muscle weakness (generalized)  Difficulty in walking, not elsewhere classified  Rationale for Evaluation and Treatment: Rehabilitation  ONSET DATE: PT order 05/11/2022 / Surgery March 2022   SUBJECTIVE:   SUBJECTIVE STATEMENT: Pt states he has been using Calm Pro and cutting back on Lyrica and reports improved sx's with neuropathy.  Pt stated he spoke to a pharmacist about it.  Pt reports he is performing his HEP.  Pt reports his achilles was bothered from the heel raises  last Rx.  Other than his achilles, he felt fine after prior Rx.        PERTINENT HISTORY: -3 back surgeries:  L3-5 laminotomy and pt thinks he has some screws in lumbar March 2022, Lumbar laminectomy/decompression/microdisctomy in 2019, Lumbar surgery in 1964   -OA, Bilat knee pain R > L, CAD, Peripheral neuropathy, Vertigo, Pseudogout, DM type II and diabetic retinopathy,    -Pt has Insulin monitor and tremors in bilat Ue's R > L   -PSHx:  R shoulder surgery in 2013 and was unable to repair rotator cuff, Coronary angioplasty with stent placement in 2015 and 2019, and L knee arthroscopy in 2012  PAIN:   Lumbar:  0/10 pain currently, 7/10 worst  Knee pain:  0/10 best  PRECAUTIONS: Other: multiple back  surgeries, peripheral neuropathy, R shoulder RCR   WEIGHT BEARING RESTRICTIONS: No  FALLS:  Has patient fallen in last 6 months? No  LIVING ENVIRONMENT: Lives with: lives with their spouse Lives in: 1 story home Stairs: 3-4 steps with rail Has following equipment at home: Single point cane, Environmental consultant - 2 wheeled, and Crutches  OCCUPATION: Pt works from home.   PLOF: Independent; Pt began using SPC in early 2019.  Pt was able to perform daily mobility and daily activities with greater ease and less difficulty   PATIENT GOALS: to improve strength in legs and get rid of his crutch   OBJECTIVE:  Note: Objective measures were completed at Evaluation unless otherwise noted.  DIAGNOSTIC FINDINGS:  L knee x ray in 11/23: IMPRESSION: 1. Severe patellofemoral and mild-to-moderate medial and lateral compartment osteoarthritis. 2. Small joint effusion.  R knee x ray in 11/23: IMPRESSION: 1. Severe patellofemoral and mild to moderate medial and lateral compartment osteoarthritis. 2. Small joint effusion.  Lumbar: FINDINGS: Segmentation:  Standard.   Alignment:  Trace anterolisthesis L3 on L4.   Vertebrae: No fracture, evidence of discitis, or bone lesion. There is some  degenerative endplate signal change at L3-4.   Conus medullaris and cauda equina: Conus extends to the T12 level. Conus and cauda equina appear normal.   Paraspinal and other soft tissues: Negative.   Disc levels:   T11-12 and T12-L1 are imaged in the sagittal plane only and negative.   L1-2: Minimal disc bulge without stenosis.   L2-3: Shallow disc bulge and mild ligamentum flavum thickening. Mild central canal and left foraminal narrowing appear unchanged. The right foramen is open.   L3-4: Status post left laminotomy. There has been progression of disease at this level. A new and very large central left paracentral disc protrusion extends into the left foramen. There is severe compression of the thecal sac and narrowing in the lateral recesses, worse on the left. Moderate bilateral foraminal narrowing is worse on the left.   L4-5: Status post left laminotomy as seen on the prior exam. There is a shallow broad-based disc bulge. The narrowing in the subarticular recesses is present which could impact either descending L5 root. Mild bilateral foraminal narrowing. No change.   L5-S1: A right subarticular recess protrusion impinging on the right S1 root appears unchanged. Foramina are open.   IMPRESSION: Since the prior MRI, the patient has developed a large central and left paracentral disc protrusion at L3-4 causing severe compression of the thecal sac and narrowing in the lateral recesses, worse on the left. Moderate bilateral foraminal narrowing at this level is worse on the left. Prior left laminotomy noted.   No change in a broad-based disc bulge at L4-5 causing narrowing in the subarticular recesses which could impact either descending L5 root. Left laminotomy defect is seen at this level.   No change in a right subarticular recess protrusion at L5-S1. The disc appears to impinge on the right S1 root.   TODAY'S TREATMENT:  Reviewed prior HEP. Pt performed: Nustep L3 just LE's x 6 mins Standing row with retraction with RTB 2x10 Standing shoulder extension with RTB 2x10 Sidestepping with bilat UE support on rail x 3 laps Marching on airex 2x10 with bilat UE support Standing on airex 3x30 sec with NBOS with occasional UE support Seated clamshell with GTB 3x10 LAQ with 3# 2x10 bilat Seated HS curls with RTB 2 x 10-15 reps bilat  Pt ambulated 255 feet with lofstrand crutch on R with cuing for posture and L arm swing.   PATIENT EDUCATION:  Education details: objective findings and in comparison to prior PT, rationale of interventions, importance of performing HEP, dx, and POC. Person educated: Patient Education method: Explanation Education comprehension: verbalized understanding  HOME EXERCISE PROGRAM: Pt already has a HEP from prior PT.  Access Code: [email or web address]   ASSESSMENT:  CLINICAL IMPRESSION: Patient reports compliance with HEP.  He performed exercises well with cuing and instruction in correct form.  PT didn't perform standing heel raises due to c/o's of achilles last visit.  Pt is limited with ambulation distance.  PT worked on improving gait.   Pt ambulated increased distance today compared to prior 3 MWT nad was fatigued with ambulatoin.  He responded well to Rx having no c/o's after Rx.  Pt should benefit from continued skilled PT services to address impairments and improve overall function.          OBJECTIVE IMPAIRMENTS: Abnormal gait, decreased activity tolerance, decreased balance, decreased endurance, decreased mobility, difficulty walking, decreased ROM, decreased strength, hypomobility, impaired flexibility, and pain.   ACTIVITY LIMITATIONS: standing, squatting, stairs, transfers, and locomotion level  PARTICIPATION LIMITATIONS: meal prep, cleaning, shopping, and community activity  PERSONAL FACTORS:  Time since onset of injury/illness/exacerbation and 3+ comorbidities:  multiple back surgeries, OA, Peripheral neuropathy, Pseudogout, DM type II  are also affecting patient's functional outcome.   REHAB POTENTIAL: Good   CLINICAL DECISION MAKING: Evolving/moderate complexity   EVALUATION COMPLEXITY: Moderate   GOALS:   SHORT TERM GOALS: Target date: 07/14/2023  Pt will report improved compliance with HEP for improved strength, pain, mobility, and function.  Baseline: Goal status: INITIAL  2.  Pt will report at least a 40% improvement in performance of and tolerance with functional mobility.  Baseline:  Goal status: INITIAL  3.  Pt will increase ambulation distance by at least 100 ft for improved tolerance with community ambulation.  Baseline:  Goal status: INITIAL    LONG TERM GOALS: Target date: 08/18/2023  Pt will be able to turn with gait with improved ease and good stability.  Baseline:  Goal status: INITIAL  2.  Pt will report at least a 50% improvement in tolerance with standing.  Baseline:  Goal status: INITIAL  3.  Pt will demo at least an 8-10# increase in bilat hip flex and abd strength, 5-10# increase in knee flexion, and 5/5 in knee extension bilat strength for improved performance of and tolerance with functional mobility.  Baseline:  Goal status: INITIAL  4.  Pt will be able to ambulate > 600 ft on 6 MWT for improved community ambulation.   Baseline:  Goal status: INITIAL     PLAN:  PT FREQUENCY: 1-2x/week  PT DURATION: 8 weeks  PLANNED INTERVENTIONS: Therapeutic exercises, Therapeutic activity, Neuromuscular re-education, Balance training, Gait training, Patient/Family education, Self Care, Joint mobilization, Stair training, Aquatic Therapy, Dry Needling, Electrical stimulation, Cryotherapy, Moist heat, Taping, Manual therapy, and Re-evaluation.   PLAN FOR NEXT SESSION: review and perform HEP.  LE strengthening, gait, and improve standing  tolerance.    Audie Clear III PT, DPT 07/28/23 4:41 PM

## 2023-08-02 ENCOUNTER — Encounter (HOSPITAL_BASED_OUTPATIENT_CLINIC_OR_DEPARTMENT_OTHER): Payer: Medicare Other | Admitting: Physical Therapy

## 2023-08-09 ENCOUNTER — Ambulatory Visit (HOSPITAL_BASED_OUTPATIENT_CLINIC_OR_DEPARTMENT_OTHER): Payer: Medicare Other | Admitting: Physical Therapy

## 2023-08-09 ENCOUNTER — Encounter (HOSPITAL_BASED_OUTPATIENT_CLINIC_OR_DEPARTMENT_OTHER): Payer: Self-pay | Admitting: Physical Therapy

## 2023-08-09 DIAGNOSIS — M25562 Pain in left knee: Secondary | ICD-10-CM | POA: Diagnosis not present

## 2023-08-09 DIAGNOSIS — G8929 Other chronic pain: Secondary | ICD-10-CM

## 2023-08-09 DIAGNOSIS — M5459 Other low back pain: Secondary | ICD-10-CM

## 2023-08-09 DIAGNOSIS — M25561 Pain in right knee: Secondary | ICD-10-CM | POA: Diagnosis not present

## 2023-08-09 DIAGNOSIS — M6281 Muscle weakness (generalized): Secondary | ICD-10-CM

## 2023-08-09 DIAGNOSIS — R262 Difficulty in walking, not elsewhere classified: Secondary | ICD-10-CM | POA: Diagnosis not present

## 2023-08-09 NOTE — Therapy (Signed)
 OUTPATIENT PHYSICAL THERAPY TREATMENT   Patient Name: Seth Young MRN: [id number] DOB:November 26, 1940, 82 y.o., male Today's Date: 08/09/2023  END OF SESSION:  PT End of Session - 08/09/23 1329     Visit Number 4    Number of Visits 10    Date for PT Re-Evaluation 08/18/23    Authorization Type [employer] MCR    PT Start Time 1323    PT Stop Time 1405    PT Time Calculation (min) 42 min    Activity Tolerance Patient tolerated treatment well    Behavior During Therapy WFL for tasks assessed/performed                Past Medical History:  Diagnosis Date   Arthritis    knees, toes, hands, probably in my back (03/09/2018)   Ataxia    Basal cell carcinoma    left temporal area-no residual   Colon polyps 10-12-11   past hx.   Coronary artery disease    Exertional angina (HCC) 06/26/2014   Cath with DES to the OM, Bioflow protocol    GERD (gastroesophageal reflux disease)    H/O hiatal hernia    no problems   High cholesterol    History of echocardiogram    Echo 7/19:  Mild LVH, mild focal basal septal hypertrophy, EF 60-65, no RWMA, Gr 1 DD, trivial AI, MAC   Hypertension    Ischemic chest pain (HCC) 08/07/2014   Occasional tremors    Bilateral hands   Peripheral neuropathy    Pneumonia 1940's X 2; ~ 2017   infant; walking pneumonia (03/09/2018)   Pseudogout    it moves around   Sleep apnea    no cpap used   Type II diabetes mellitus (HCC) dx'd 1982   nsulin started ~ 2000   Vertigo    Past Surgical History:  Procedure Laterality Date   BACK SURGERY     BASAL CELL CARCINOMA EXCISION Left ~ 2012   face   CARDIAC CATHETERIZATION  02/2005   Dr. Dominick; negative.   CARDIAC CATHETERIZATION N/A 05/13/2016   Procedure: Left Heart Cath and Coronary Angiography;  Surgeon: Oneil JAYSON Parchment, MD;  Location: [facility];  Service: Cardiovascular;  Laterality: N/A;   CARPAL TUNNEL RELEASE Left 10/2009   CARPAL TUNNEL RELEASE Right ~ 2013   CATARACT EXTRACTION W/  INTRAOCULAR LENS IMPLANT Left 2000's   CATARACT EXTRACTION W/ INTRAOCULAR LENS IMPLANT Right 2015   COLONOSCOPY W/ POLYPECTOMY     CORONARY ANGIOPLASTY WITH STENT PLACEMENT  06/26/2014   1   CORONARY ANGIOPLASTY WITH STENT PLACEMENT  06/26/2014   pLAD 50%, d LAD 60%, oD1 80%,  mD1  70%, CFX 50%, OM 2 70%,  RCA 80%, PDA 95% (<23mm), OM1 99%-0% with Bio flow stent        CORONARY ANGIOPLASTY WITH STENT PLACEMENT  03/09/2018   CORONARY STENT INTERVENTION N/A 03/09/2018   Procedure: CORONARY STENT INTERVENTION;  Surgeon: Verlin Lonni BIRCH, MD;  Location: [facility];  Service: Cardiovascular;  Laterality: N/A;   ELBOW SURGERY Bilateral ~ 2014   for blockages; Dr. Camella   ENDARTERECTOMY Left 04/23/2015   Procedure: ENDARTERECTOMY CAROTID;  Surgeon: Redell LITTIE Door, MD;  Location: [facility] OR;  Service: Vascular;  Laterality: Left;   EYE SURGERY Left    related go diabetes; laser   KNEE ARTHROSCOPY Left 05/2011   LEFT HEART CATH AND CORONARY ANGIOGRAPHY N/A 03/09/2018   Procedure: LEFT HEART CATH AND CORONARY ANGIOGRAPHY;  Surgeon: Verlin Lonni BIRCH, MD;  Location: [facility] INVASIVE CV LAB;  Service: Cardiovascular;  Laterality: N/A;   LEFT HEART CATHETERIZATION WITH CORONARY ANGIOGRAM N/A 06/26/2014   Procedure: LEFT HEART CATHETERIZATION WITH CORONARY ANGIOGRAM;  Surgeon: Ozell BIRCH Fell, MD;  Location: [facility] CATH LAB;  Service: Cardiovascular;  Laterality: N/A;   LEFT HEART CATHETERIZATION WITH CORONARY ANGIOGRAM N/A 08/07/2014   Procedure: LEFT HEART CATHETERIZATION WITH CORONARY ANGIOGRAM;  Surgeon: Ozell BIRCH Fell, MD;  Location: [facility] CATH LAB;  Service: Cardiovascular;  Laterality: N/A;   LUMBAR LAMINECTOMY/DECOMPRESSION MICRODISCECTOMY Left 11/02/2017   Procedure: MICRODISCECTOMY LUMBAR THREE- LUMBAR FOUR, LEFT;  Surgeon: Gillie Duncans, MD;  Location: [facility] OR;  Service: Neurosurgery;  Laterality: Left;   NASAL SEPTUM SURGERY  1979   PATCH ANGIOPLASTY Left 04/23/2015   Procedure: PATCH  ANGIOPLASTY USING 1CM X 6CM XENOSURE BIOLOGIC PATCH;  Surgeon: Redell LITTIE Door, MD;  Location: [facility] OR;  Service: Vascular;  Laterality: Left;   POSTERIOR LUMBAR FUSION  1964   SHOULDER OPEN ROTATOR CUFF REPAIR  10/14/2011   Procedure: ROTATOR CUFF REPAIR SHOULDER OPEN;  Surgeon: Reyes JAYSON Billing, MD;  Location: [facility] ORS;  Service: Orthopedics;  Laterality: Right;   SUBACROMIAL DECOMPRESSION  10/14/2011   Procedure: SUBACROMIAL DECOMPRESSION;  Surgeon: Reyes JAYSON Billing, MD;  Location: [facility] ORS;  Service: Orthopedics;  Laterality: Right;   TRIGGER FINGER RELEASE Right ~ 2013-2014   3rd & 4th digits   Patient Active Problem List   Diagnosis Date Noted   Coronary artery disease involving native coronary artery of native heart with angina pectoris (HCC) 03/07/2018   HNP (herniated nucleus pulposus), lumbar 11/02/2017   Carotid artery disease without cerebral infarction (HCC) 04/09/2015   Chest pain of uncertain etiology 06/21/2014   Diabetic neuropathy (HCC) 06/21/2014   Essential hypertension 06/21/2014   Hypercholesterolemia 06/21/2014   Rotator cuff tear 10/15/2011   IDDM (insulin  dependent diabetes mellitus) 10/16/2008    PCP: Shepard Ade, MD   REFERRING PROVIDER: Shepard Ade, MD   REFERRING DIAG: G89.29 other chronic pain, M54.40 Lumbago with sciatica, unspecified, M25.562 (ICD-10-CM) - Pain in left knee M25.561 (ICD-10-CM) - Pain in right knee  THERAPY DIAG:  Chronic pain of both knees  Other low back pain  Muscle weakness (generalized)  Difficulty in walking, not elsewhere classified  Rationale for Evaluation and Treatment: Rehabilitation  ONSET DATE: PT order 05/11/2022 / Surgery March 2022   SUBJECTIVE:   SUBJECTIVE STATEMENT: Pt denies any adverse effects after prior Rx.  Pt states he has been working on trying to improve arm swing with gait.  He notices if he slouches with gait, he can swing his arms better.  Pt is performing his HEP though backed off from HEP the past  2 days.  Pt reports his walking is better.  He is walking some in his home without his cane.  He states his exercises are getting easier.          PERTINENT HISTORY: -3 back surgeries:  L3-5 laminotomy and pt thinks he has some screws in lumbar March 2022, Lumbar laminectomy/decompression/microdisctomy in 2019, Lumbar surgery in 1964   -OA, Bilat knee pain R > L, CAD, Peripheral neuropathy, Vertigo, Pseudogout, DM type II and diabetic retinopathy,    -Pt has Insulin  monitor and tremors in bilat Ue's R > L   -PSHx:  R shoulder surgery in 2013 and was unable to repair rotator cuff, Coronary angioplasty with stent placement in 2015 and 2019, and L knee arthroscopy in 2012  PAIN:  Lumbar:  0/10 pain currently, 7/10 worst  Knee pain:  little ache in bilat knees  PRECAUTIONS: Other: multiple back surgeries, peripheral neuropathy, R shoulder RCR   WEIGHT BEARING RESTRICTIONS: No  FALLS:  Has patient fallen in last 6 months? No  LIVING ENVIRONMENT: Lives with: lives with their spouse Lives in: 1 story home Stairs: 3-4 steps with rail Has following equipment at home: Single point cane, Environmental Consultant - 2 wheeled, and Crutches  OCCUPATION: Pt works from home.   PLOF: Independent; Pt began using SPC in early 2019.  Pt was able to perform daily mobility and daily activities with greater ease and less difficulty   PATIENT GOALS: to improve strength in legs and get rid of his crutch   OBJECTIVE:  Note: Objective measures were completed at Evaluation unless otherwise noted.  DIAGNOSTIC FINDINGS:  L knee x ray in 11/23: IMPRESSION: 1. Severe patellofemoral and mild-to-moderate medial and lateral compartment osteoarthritis. 2. Small joint effusion.  R knee x ray in 11/23: IMPRESSION: 1. Severe patellofemoral and mild to moderate medial and lateral compartment osteoarthritis. 2. Small joint effusion.  Lumbar: FINDINGS: Segmentation:  Standard.   Alignment:  Trace anterolisthesis L3  on L4.   Vertebrae: No fracture, evidence of discitis, or bone lesion. There is some degenerative endplate signal change at L3-4.   Conus medullaris and cauda equina: Conus extends to the T12 level. Conus and cauda equina appear normal.   Paraspinal and other soft tissues: Negative.   Disc levels:   T11-12 and T12-L1 are imaged in the sagittal plane only and negative.   L1-2: Minimal disc bulge without stenosis.   L2-3: Shallow disc bulge and mild ligamentum flavum thickening. Mild central canal and left foraminal narrowing appear unchanged. The right foramen is open.   L3-4: Status post left laminotomy. There has been progression of disease at this level. A new and very large central left paracentral disc protrusion extends into the left foramen. There is severe compression of the thecal sac and narrowing in the lateral recesses, worse on the left. Moderate bilateral foraminal narrowing is worse on the left.   L4-5: Status post left laminotomy as seen on the prior exam. There is a shallow broad-based disc bulge. The narrowing in the subarticular recesses is present which could impact either descending L5 root. Mild bilateral foraminal narrowing. No change.   L5-S1: A right subarticular recess protrusion impinging on the right S1 root appears unchanged. Foramina are open.   IMPRESSION: Since the prior MRI, the patient has developed a large central and left paracentral disc protrusion at L3-4 causing severe compression of the thecal sac and narrowing in the lateral recesses, worse on the left. Moderate bilateral foraminal narrowing at this level is worse on the left. Prior left laminotomy noted.   No change in a broad-based disc bulge at L4-5 causing narrowing in the subarticular recesses which could impact either descending L5 root. Left laminotomy defect is seen at this level.   No change in a right subarticular recess protrusion at L5-S1. The disc appears to impinge  on the right S1 root.   TODAY'S TREATMENT:  Pt performed: Nustep L3 just Seth's x 6 mins LAQ with 3# 3x10 bilat Seated HS curls with RTB 2 x 10-15 reps bilat Standing row with retraction with RTB 2x10 Standing shoulder extension with RTB 2x10 Sidestepping with bilat UE support on rail x 3 laps Standing on airex with manual perturbations 3x30 sec with intermittent UE support   Pt ambulated 518 feet with lofstrand crutch on R with cuing for posture and L arm swing. Sit to stands 2x5-6 reps from elevated table with UE support  Step ups on airex with bilat UE support x 10 reps bilat  PATIENT EDUCATION:  Education details: objective findings and in comparison to prior PT, rationale of interventions, importance of performing HEP, dx, and POC. Person educated: Patient Education method: Explanation Education comprehension: verbalized understanding  HOME EXERCISE PROGRAM: Pt already has a HEP from prior PT.  Access Code: [email or web address]   ASSESSMENT:  CLINICAL IMPRESSION: Patient has improved compliance with HEP overall.  He is improving with mobility as evidenced by subjective reports of improved gait.  Pt ambulated increased distance in the clinic today with lofstrand crutch.  PT had pt perform sit to stands to improve functional Seth strength and performance of transfers.  He could feel his knees with sit/stands and PT increased height of surface to decrease stress on knees.  Pt gave great effort with all exercises and performed exercises.  Pt requires UE support with step ups on airex.  He had LOB with manual perturbations on airex requiring UE support.  Pt responded well to Rx and should benefit from continued skilled PT services to address impairments and ongoing goals and improve overall function.          OBJECTIVE IMPAIRMENTS: Abnormal gait, decreased activity  tolerance, decreased balance, decreased endurance, decreased mobility, difficulty walking, decreased ROM, decreased strength, hypomobility, impaired flexibility, and pain.   ACTIVITY LIMITATIONS: standing, squatting, stairs, transfers, and locomotion level  PARTICIPATION LIMITATIONS: meal prep, cleaning, shopping, and community activity  PERSONAL FACTORS: Time since onset of injury/illness/exacerbation and 3+ comorbidities:  multiple back surgeries, OA, Peripheral neuropathy, Pseudogout, DM type II  are also affecting patient's functional outcome.   REHAB POTENTIAL: Good   CLINICAL DECISION MAKING: Evolving/moderate complexity   EVALUATION COMPLEXITY: Moderate   GOALS:   SHORT TERM GOALS: Target date: 07/14/2023  Pt will report improved compliance with HEP for improved strength, pain, mobility, and function.  Baseline: Goal status: INITIAL  2.  Pt will report at least a 40% improvement in performance of and tolerance with functional mobility.  Baseline:  Goal status: INITIAL  3.  Pt will increase ambulation distance by at least 100 ft for improved tolerance with community ambulation.  Baseline:  Goal status: INITIAL    LONG TERM GOALS: Target date: 08/18/2023  Pt will be able to turn with gait with improved ease and good stability.  Baseline:  Goal status: INITIAL  2.  Pt will report at least a 50% improvement in tolerance with standing.  Baseline:  Goal status: INITIAL  3.  Pt will demo at least an 8-10# increase in bilat hip flex and abd strength, 5-10# increase in knee flexion, and 5/5 in knee extension bilat strength for improved performance of and tolerance with functional mobility.  Baseline:  Goal status: INITIAL  4.  Pt will be able to ambulate > 600 ft on 6 MWT for improved community ambulation.   Baseline:  Goal status: INITIAL     PLAN:  PT FREQUENCY: 1-2x/week  PT DURATION:  8 weeks  PLANNED INTERVENTIONS: Therapeutic exercises, Therapeutic  activity, Neuromuscular re-education, Balance training, Gait training, Patient/Family education, Self Care, Joint mobilization, Stair training, Aquatic Therapy, Dry Needling, Electrical stimulation, Cryotherapy, Moist heat, Taping, Manual therapy, and Re-evaluation.   PLAN FOR NEXT SESSION: review and perform HEP.  Seth strengthening, gait, and improve standing tolerance.    Leigh Minerva III PT, DPT 08/09/23 4:00 PM

## 2023-08-12 DIAGNOSIS — E1129 Type 2 diabetes mellitus with other diabetic kidney complication: Secondary | ICD-10-CM | POA: Diagnosis not present

## 2023-08-17 ENCOUNTER — Ambulatory Visit (HOSPITAL_BASED_OUTPATIENT_CLINIC_OR_DEPARTMENT_OTHER): Payer: Medicare Other | Attending: Internal Medicine | Admitting: Physical Therapy

## 2023-08-17 ENCOUNTER — Encounter (HOSPITAL_BASED_OUTPATIENT_CLINIC_OR_DEPARTMENT_OTHER): Payer: Self-pay | Admitting: Physical Therapy

## 2023-08-17 DIAGNOSIS — R262 Difficulty in walking, not elsewhere classified: Secondary | ICD-10-CM | POA: Insufficient documentation

## 2023-08-17 DIAGNOSIS — M6281 Muscle weakness (generalized): Secondary | ICD-10-CM | POA: Insufficient documentation

## 2023-08-17 DIAGNOSIS — G8929 Other chronic pain: Secondary | ICD-10-CM | POA: Insufficient documentation

## 2023-08-17 DIAGNOSIS — M25561 Pain in right knee: Secondary | ICD-10-CM | POA: Diagnosis not present

## 2023-08-17 DIAGNOSIS — M5459 Other low back pain: Secondary | ICD-10-CM | POA: Diagnosis not present

## 2023-08-17 DIAGNOSIS — M25562 Pain in left knee: Secondary | ICD-10-CM | POA: Diagnosis not present

## 2023-08-17 NOTE — Therapy (Signed)
 OUTPATIENT PHYSICAL THERAPY TREATMENT   Patient Name: Seth Young MRN: [id number] DOB:08/12/1940, 83 y.o., male Today's Date: 08/18/2023  END OF SESSION:  PT End of Session - 08/17/23 1356     Visit Number 5    Number of Visits 10    Date for PT Re-Evaluation 08/18/23    Authorization Type [employer] MCR    PT Start Time 1321    PT Stop Time 1400    PT Time Calculation (min) 39 min    Activity Tolerance Patient tolerated treatment well    Behavior During Therapy WFL for tasks assessed/performed                 Past Medical History:  Diagnosis Date   Arthritis    knees, toes, hands, probably in my back (03/09/2018)   Ataxia    Basal cell carcinoma    left temporal area-no residual   Colon polyps 10-12-11   past hx.   Coronary artery disease    Exertional angina (HCC) 06/26/2014   Cath with DES to the OM, Bioflow protocol    GERD (gastroesophageal reflux disease)    H/O hiatal hernia    no problems   High cholesterol    History of echocardiogram    Echo 7/19:  Mild LVH, mild focal basal septal hypertrophy, EF 60-65, no RWMA, Gr 1 DD, trivial AI, MAC   Hypertension    Ischemic chest pain (HCC) 08/07/2014   Occasional tremors    Bilateral hands   Peripheral neuropathy    Pneumonia 1940's X 2; ~ 2017   infant; walking pneumonia (03/09/2018)   Pseudogout    it moves around   Sleep apnea    no cpap used   Type II diabetes mellitus (HCC) dx'd 1982   nsulin started ~ 2000   Vertigo    Past Surgical History:  Procedure Laterality Date   BACK SURGERY     BASAL CELL CARCINOMA EXCISION Left ~ 2012   face   CARDIAC CATHETERIZATION  02/2005   Dr. Dominick; negative.   CARDIAC CATHETERIZATION N/A 05/13/2016   Procedure: Left Heart Cath and Coronary Angiography;  Surgeon: Oneil JAYSON Parchment, MD;  Location: [facility];  Service: Cardiovascular;  Laterality: N/A;   CARPAL TUNNEL RELEASE Left 10/2009   CARPAL TUNNEL RELEASE Right ~ 2013   CATARACT EXTRACTION W/  INTRAOCULAR LENS IMPLANT Left 2000's   CATARACT EXTRACTION W/ INTRAOCULAR LENS IMPLANT Right 2015   COLONOSCOPY W/ POLYPECTOMY     CORONARY ANGIOPLASTY WITH STENT PLACEMENT  06/26/2014   1   CORONARY ANGIOPLASTY WITH STENT PLACEMENT  06/26/2014   pLAD 50%, d LAD 60%, oD1 80%,  mD1  70%, CFX 50%, OM 2 70%,  RCA 80%, PDA 95% (<74mm), OM1 99%-0% with Bio flow stent        CORONARY ANGIOPLASTY WITH STENT PLACEMENT  03/09/2018   CORONARY STENT INTERVENTION N/A 03/09/2018   Procedure: CORONARY STENT INTERVENTION;  Surgeon: Verlin Lonni BIRCH, MD;  Location: [facility];  Service: Cardiovascular;  Laterality: N/A;   ELBOW SURGERY Bilateral ~ 2014   for blockages; Dr. Camella   ENDARTERECTOMY Left 04/23/2015   Procedure: ENDARTERECTOMY CAROTID;  Surgeon: Redell LITTIE Door, MD;  Location: [facility] OR;  Service: Vascular;  Laterality: Left;   EYE SURGERY Left    related go diabetes; laser   KNEE ARTHROSCOPY Left 05/2011   LEFT HEART CATH AND CORONARY ANGIOGRAPHY N/A 03/09/2018   Procedure: LEFT HEART CATH AND CORONARY ANGIOGRAPHY;  Surgeon: Verlin Lonni BIRCH, MD;  Location: [facility] INVASIVE CV LAB;  Service: Cardiovascular;  Laterality: N/A;   LEFT HEART CATHETERIZATION WITH CORONARY ANGIOGRAM N/A 06/26/2014   Procedure: LEFT HEART CATHETERIZATION WITH CORONARY ANGIOGRAM;  Surgeon: Ozell BIRCH Fell, MD;  Location: [facility] CATH LAB;  Service: Cardiovascular;  Laterality: N/A;   LEFT HEART CATHETERIZATION WITH CORONARY ANGIOGRAM N/A 08/07/2014   Procedure: LEFT HEART CATHETERIZATION WITH CORONARY ANGIOGRAM;  Surgeon: Ozell BIRCH Fell, MD;  Location: [facility] CATH LAB;  Service: Cardiovascular;  Laterality: N/A;   LUMBAR LAMINECTOMY/DECOMPRESSION MICRODISCECTOMY Left 11/02/2017   Procedure: MICRODISCECTOMY LUMBAR THREE- LUMBAR FOUR, LEFT;  Surgeon: Gillie Duncans, MD;  Location: [facility] OR;  Service: Neurosurgery;  Laterality: Left;   NASAL SEPTUM SURGERY  1979   PATCH ANGIOPLASTY Left 04/23/2015   Procedure: PATCH  ANGIOPLASTY USING 1CM X 6CM XENOSURE BIOLOGIC PATCH;  Surgeon: Redell LITTIE Door, MD;  Location: [facility] OR;  Service: Vascular;  Laterality: Left;   POSTERIOR LUMBAR FUSION  1964   SHOULDER OPEN ROTATOR CUFF REPAIR  10/14/2011   Procedure: ROTATOR CUFF REPAIR SHOULDER OPEN;  Surgeon: Reyes JAYSON Billing, MD;  Location: [facility] ORS;  Service: Orthopedics;  Laterality: Right;   SUBACROMIAL DECOMPRESSION  10/14/2011   Procedure: SUBACROMIAL DECOMPRESSION;  Surgeon: Reyes JAYSON Billing, MD;  Location: [facility] ORS;  Service: Orthopedics;  Laterality: Right;   TRIGGER FINGER RELEASE Right ~ 2013-2014   3rd & 4th digits   Patient Active Problem List   Diagnosis Date Noted   Coronary artery disease involving native coronary artery of native heart with angina pectoris (HCC) 03/07/2018   HNP (herniated nucleus pulposus), lumbar 11/02/2017   Carotid artery disease without cerebral infarction (HCC) 04/09/2015   Chest pain of uncertain etiology 06/21/2014   Diabetic neuropathy (HCC) 06/21/2014   Essential hypertension 06/21/2014   Hypercholesterolemia 06/21/2014   Rotator cuff tear 10/15/2011   IDDM (insulin  dependent diabetes mellitus) 10/16/2008    PCP: Shepard Ade, MD   REFERRING PROVIDER: Shepard Ade, MD   REFERRING DIAG: G89.29 other chronic pain, M54.40 Lumbago with sciatica, unspecified, M25.562 (ICD-10-CM) - Pain in left knee M25.561 (ICD-10-CM) - Pain in right knee  THERAPY DIAG:  Chronic pain of both knees  Other low back pain  Muscle weakness (generalized)  Difficulty in walking, not elsewhere classified  Rationale for Evaluation and Treatment: Rehabilitation  ONSET DATE: PT order 05/11/2022 / Surgery March 2022   SUBJECTIVE:   SUBJECTIVE STATEMENT: Pt states he did really well after prior Rx.  Pt states his knees have been bothering for the past couple of days.  Pt states he can feels his posterior chain LE mm working which has helped him move better.  Pt states at times his posture is better  which helps him walk better.  He has been performing some of his HEP.  He has not been performing band exercises.          PERTINENT HISTORY: -3 back surgeries:  L3-5 laminotomy and pt thinks he has some screws in lumbar March 2022, Lumbar laminectomy/decompression/microdisctomy in 2019, Lumbar surgery in 1964   -OA, Bilat knee pain R > L, CAD, Peripheral neuropathy, Vertigo, Pseudogout, DM type II and diabetic retinopathy,    -Pt has Insulin  monitor and tremors in bilat Ue's R > L   -PSHx:  R shoulder surgery in 2013 and was unable to repair rotator cuff, Coronary angioplasty with stent placement in 2015 and 2019, and L knee arthroscopy in 2012  PAIN:   Lumbar:  0/10 pain currently,  7/10 worst  Knee pain:  little ache in bilat knees  PRECAUTIONS: Other: multiple back surgeries, peripheral neuropathy, R shoulder RCR   WEIGHT BEARING RESTRICTIONS: No  FALLS:  Has patient fallen in last 6 months? No  LIVING ENVIRONMENT: Lives with: lives with their spouse Lives in: 1 story home Stairs: 3-4 steps with rail Has following equipment at home: Single point cane, Environmental Consultant - 2 wheeled, and Crutches  OCCUPATION: Pt works from home.   PLOF: Independent; Pt began using SPC in early 2019.  Pt was able to perform daily mobility and daily activities with greater ease and less difficulty   PATIENT GOALS: to improve strength in legs and get rid of his crutch   OBJECTIVE:  Note: Objective measures were completed at Evaluation unless otherwise noted.  DIAGNOSTIC FINDINGS:  L knee x ray in 11/23: IMPRESSION: 1. Severe patellofemoral and mild-to-moderate medial and lateral compartment osteoarthritis. 2. Small joint effusion.  R knee x ray in 11/23: IMPRESSION: 1. Severe patellofemoral and mild to moderate medial and lateral compartment osteoarthritis. 2. Small joint effusion.  Lumbar: FINDINGS: Segmentation:  Standard.   Alignment:  Trace anterolisthesis L3 on L4.   Vertebrae: No  fracture, evidence of discitis, or bone lesion. There is some degenerative endplate signal change at L3-4.   Conus medullaris and cauda equina: Conus extends to the T12 level. Conus and cauda equina appear normal.   Paraspinal and other soft tissues: Negative.   Disc levels:   T11-12 and T12-L1 are imaged in the sagittal plane only and negative.   L1-2: Minimal disc bulge without stenosis.   L2-3: Shallow disc bulge and mild ligamentum flavum thickening. Mild central canal and left foraminal narrowing appear unchanged. The right foramen is open.   L3-4: Status post left laminotomy. There has been progression of disease at this level. A new and very large central left paracentral disc protrusion extends into the left foramen. There is severe compression of the thecal sac and narrowing in the lateral recesses, worse on the left. Moderate bilateral foraminal narrowing is worse on the left.   L4-5: Status post left laminotomy as seen on the prior exam. There is a shallow broad-based disc bulge. The narrowing in the subarticular recesses is present which could impact either descending L5 root. Mild bilateral foraminal narrowing. No change.   L5-S1: A right subarticular recess protrusion impinging on the right S1 root appears unchanged. Foramina are open.   IMPRESSION: Since the prior MRI, the patient has developed a large central and left paracentral disc protrusion at L3-4 causing severe compression of the thecal sac and narrowing in the lateral recesses, worse on the left. Moderate bilateral foraminal narrowing at this level is worse on the left. Prior left laminotomy noted.   No change in a broad-based disc bulge at L4-5 causing narrowing in the subarticular recesses which could impact either descending L5 root. Left laminotomy defect is seen at this level.   No change in a right subarticular recess protrusion at L5-S1. The disc appears to impinge on the right S1  root.   TODAY'S TREATMENT:  Ther - Ex: Nustep L3 just LE's x 6 mins Sidestepping with bilat UE support on rail x 2 laps and with RTB proximal to knees x 2 laps LAQ with 3# 3x10 bilat Seated HS curls with RTB 3 x 10 reps bilat  Neuro Re-ed Actvities: Standing on airex with NBOS 2x40-44 sec Standing on airex with manual perturbations 3x30 sec with intermittent UE support Marching on airex 2x10   Therapeutic Activities: Pt ambulated 310 feet with lofstrand crutch on R with cuing for posture and L arm swing. Sit to stands 3x5 reps from elevated table with UE support    PATIENT EDUCATION:  Education details: gait, rationale of interventions, importance of performing HEP, dx, and POC. Person educated: Patient Education method: Explanation, verbal cues, demonstration Education comprehension: verbalized understanding, returned demonstration, verbal cues required  HOME EXERCISE PROGRAM: Pt already has a HEP from prior PT.  Access Code: [email or web address]   ASSESSMENT:  CLINICAL IMPRESSION: Pt ambulated decreased distance today, but ambulation was performed at the end of the treatment instead of the beginning.  PT had pt perform sit to stands to improve functional LE strength and performance of transfers.  Pt requires elevated table to perform sit to stands due to his knees.  Pt gives good effort with all exercises.  PT added RTB to sidestepping to improve hip strength.  Pt reports he could feel it in his knees though was able to complete 2 laps.  Pt has been performing some of his HEP.  PT encouraged pt to be compliant with all of HEP.  Pt responded well to Rx having no c/o's and no increased pain after Rx.    OBJECTIVE IMPAIRMENTS: Abnormal gait, decreased activity tolerance, decreased balance, decreased endurance, decreased mobility, difficulty walking, decreased ROM,  decreased strength, hypomobility, impaired flexibility, and pain.   ACTIVITY LIMITATIONS: standing, squatting, stairs, transfers, and locomotion level  PARTICIPATION LIMITATIONS: meal prep, cleaning, shopping, and community activity  PERSONAL FACTORS: Time since onset of injury/illness/exacerbation and 3+ comorbidities:  multiple back surgeries, OA, Peripheral neuropathy, Pseudogout, DM type II  are also affecting patient's functional outcome.   REHAB POTENTIAL: Good   CLINICAL DECISION MAKING: Evolving/moderate complexity   EVALUATION COMPLEXITY: Moderate   GOALS:   SHORT TERM GOALS: Target date: 07/14/2023  Pt will report improved compliance with HEP for improved strength, pain, mobility, and function.  Baseline: Goal status: INITIAL  2.  Pt will report at least a 40% improvement in performance of and tolerance with functional mobility.  Baseline:  Goal status: INITIAL  3.  Pt will increase ambulation distance by at least 100 ft for improved tolerance with community ambulation.  Baseline:  Goal status: INITIAL    LONG TERM GOALS: Target date: 08/18/2023  Pt will be able to turn with gait with improved ease and good stability.  Baseline:  Goal status: INITIAL  2.  Pt will report at least a 50% improvement in tolerance with standing.  Baseline:  Goal status: INITIAL  3.  Pt will demo at least an 8-10# increase in bilat hip flex and abd strength, 5-10# increase in knee flexion, and 5/5 in knee extension bilat strength for improved performance of and tolerance with functional mobility.  Baseline:  Goal status: INITIAL  4.  Pt will be able to ambulate > 600 ft on 6 MWT for improved community ambulation.   Baseline:  Goal status: INITIAL     PLAN:  PT FREQUENCY: 1-2x/week  PT DURATION: 8 weeks  PLANNED INTERVENTIONS: Therapeutic exercises, Therapeutic  activity, Neuromuscular re-education, Balance training, Gait training, Patient/Family education, Self Care, Joint  mobilization, Stair training, Aquatic Therapy, Dry Needling, Electrical stimulation, Cryotherapy, Moist heat, Taping, Manual therapy, and Re-evaluation.   PLAN FOR NEXT SESSION: review and perform HEP.  LE strengthening, gait, and improve standing tolerance.    Leigh Minerva III PT, DPT 08/18/23 7:20 PM

## 2023-08-25 ENCOUNTER — Encounter (HOSPITAL_BASED_OUTPATIENT_CLINIC_OR_DEPARTMENT_OTHER): Payer: Medicare Other | Admitting: Physical Therapy

## 2023-08-26 ENCOUNTER — Telehealth: Payer: Self-pay | Admitting: Cardiology

## 2023-08-26 ENCOUNTER — Other Ambulatory Visit: Payer: Self-pay | Admitting: Cardiology

## 2023-08-26 MED ORDER — LISINOPRIL 5 MG PO TABS: 5.0000 mg | ORAL_TABLET | Freq: Every day | ORAL | 2 refills | Status: DC

## 2023-08-26 NOTE — Telephone Encounter (Signed)
Pt's medication was sent to pt's pharmacy as requested. Confirmation received.  °

## 2023-08-26 NOTE — Telephone Encounter (Signed)
 *  STAT* If patient is at the pharmacy, call can be transferred to refill team.   1. Which medications need to be refilled? (please list name of each medication and dose if known) lisinopril (ZESTRIL) 5 MG tablet    2. Would you like to learn more about the convenience, safety, & potential cost savings by using the [facility]?    3. Are you open to using the [facility] (Type [facility].    4. Which pharmacy/location (including street and city if local pharmacy) is medication to be sent to?  [facility] [address] [place] - [address]     5. Do they need a 30 day or 90 day supply? 90 days   Pt been out of meds since December. Need refill today

## 2023-08-31 ENCOUNTER — Encounter (HOSPITAL_BASED_OUTPATIENT_CLINIC_OR_DEPARTMENT_OTHER): Payer: Medicare Other | Admitting: Physical Therapy

## 2023-08-31 DIAGNOSIS — E1129 Type 2 diabetes mellitus with other diabetic kidney complication: Secondary | ICD-10-CM | POA: Diagnosis not present

## 2023-08-31 DIAGNOSIS — J449 Chronic obstructive pulmonary disease, unspecified: Secondary | ICD-10-CM | POA: Diagnosis not present

## 2023-09-07 ENCOUNTER — Ambulatory Visit (HOSPITAL_BASED_OUTPATIENT_CLINIC_OR_DEPARTMENT_OTHER): Payer: Medicare Other | Admitting: Physical Therapy

## 2023-09-07 ENCOUNTER — Encounter (HOSPITAL_BASED_OUTPATIENT_CLINIC_OR_DEPARTMENT_OTHER): Payer: Self-pay | Admitting: Physical Therapy

## 2023-09-07 DIAGNOSIS — M6281 Muscle weakness (generalized): Secondary | ICD-10-CM | POA: Diagnosis not present

## 2023-09-07 DIAGNOSIS — R262 Difficulty in walking, not elsewhere classified: Secondary | ICD-10-CM | POA: Diagnosis not present

## 2023-09-07 DIAGNOSIS — M25561 Pain in right knee: Secondary | ICD-10-CM | POA: Diagnosis not present

## 2023-09-07 DIAGNOSIS — G8929 Other chronic pain: Secondary | ICD-10-CM | POA: Diagnosis not present

## 2023-09-07 DIAGNOSIS — M25562 Pain in left knee: Secondary | ICD-10-CM | POA: Diagnosis not present

## 2023-09-07 DIAGNOSIS — M5459 Other low back pain: Secondary | ICD-10-CM

## 2023-09-07 NOTE — Therapy (Signed)
OUTPATIENT PHYSICAL THERAPY TREATMENT / PROGRESS NOTE   Progress Note Reporting Period 06/23/2023 to 09/07/2023  See note below for Objective Data and Assessment of Progress/Goals.       Patient Name: Seth Young MRN: [id number] DOB:1941-03-16, 83 y.o., male Today's Date: 09/07/2023  END OF SESSION:  PT End of Session - 09/07/23 1319     Visit Number 6    Number of Visits 12    Date for PT Re-Evaluation 10/19/23    Authorization Type [employer] MCR    PT Start Time 1317    PT Stop Time 1357    PT Time Calculation (min) 40 min    Activity Tolerance Patient tolerated treatment well    Behavior During Therapy WFL for tasks assessed/performed                 Past Medical History:  Diagnosis Date   Arthritis    "knees, toes, hands, probably in my back" (03/09/2018)   Ataxia    Basal cell carcinoma    left temporal area-no residual   Colon polyps 10-12-11   past hx.   Coronary artery disease    Exertional angina (HCC) 06/26/2014   Cath with DES to the OM, Bioflow protocol    GERD (gastroesophageal reflux disease)    H/O hiatal hernia    no problems   High cholesterol    History of echocardiogram    Echo 7/19:  Mild LVH, mild focal basal septal hypertrophy, EF 60-65, no RWMA, Gr 1 DD, trivial AI, MAC   Hypertension    Ischemic chest pain (HCC) 08/07/2014   Occasional tremors    Bilateral hands   Peripheral neuropathy    Pneumonia 1940's X 2; ~ 2017   "infant; walking pneumonia" (03/09/2018)   Pseudogout    "it moves around"   Sleep apnea    no cpap used   Type II diabetes mellitus (HCC) dx'd 1982   nsulin started ~ 2000   Vertigo    Past Surgical History:  Procedure Laterality Date   BACK SURGERY     BASAL CELL CARCINOMA EXCISION Left ~ 2012   face   CARDIAC CATHETERIZATION  02/2005   Dr. Patty Sermons; negative.   CARDIAC CATHETERIZATION N/A 05/13/2016   Procedure: Left Heart Cath and Coronary Angiography;  Surgeon: Jake Bathe, MD;  Location: [facility];  Service: Cardiovascular;  Laterality: N/A;   CARPAL TUNNEL RELEASE Left 10/2009   CARPAL TUNNEL RELEASE Right ~ 2013   CATARACT EXTRACTION W/ INTRAOCULAR LENS IMPLANT Left 2000's   CATARACT EXTRACTION W/ INTRAOCULAR LENS IMPLANT Right 2015   COLONOSCOPY W/ POLYPECTOMY     CORONARY ANGIOPLASTY WITH STENT PLACEMENT  06/26/2014   "1"   CORONARY ANGIOPLASTY WITH STENT PLACEMENT  06/26/2014   pLAD 50%, d LAD 60%, oD1 80%,  mD1  70%, CFX 50%, OM 2 70%,  RCA 80%, PDA 95% (<74mm), OM1 99%-0% with Bio flow stent        CORONARY ANGIOPLASTY WITH STENT PLACEMENT  03/09/2018   CORONARY STENT INTERVENTION N/A 03/09/2018   Procedure: CORONARY STENT INTERVENTION;  Surgeon: Kathleene Hazel, MD;  Location: [facility];  Service: Cardiovascular;  Laterality: N/A;   ELBOW SURGERY Bilateral ~ 2014   "for blockages; Dr. Amanda Pea"   ENDARTERECTOMY Left 04/23/2015   Procedure: ENDARTERECTOMY CAROTID;  Surgeon: Fransisco Hertz, MD;  Location: [facility] OR;  Service: Vascular;  Laterality: Left;   EYE SURGERY Left    "related go  diabetes; laser"   KNEE ARTHROSCOPY Left 05/2011   LEFT HEART CATH AND CORONARY ANGIOGRAPHY N/A 03/09/2018   Procedure: LEFT HEART CATH AND CORONARY ANGIOGRAPHY;  Surgeon: Kathleene Hazel, MD;  Location: [facility];  Service: Cardiovascular;  Laterality: N/A;   LEFT HEART CATHETERIZATION WITH CORONARY ANGIOGRAM N/A 06/26/2014   Procedure: LEFT HEART CATHETERIZATION WITH CORONARY ANGIOGRAM;  Surgeon: Micheline Chapman, MD;  Location[facility] C[facility];  Service: Cardiovascular;  Laterality: N/A;   LEFT HEART CATHETERIZATION WITH CORONARY ANGIOGRAM N/A 08/07/2014   Procedure: LEFT HEART CATHETERIZATION WITH CORONARY ANGIOGRAM;  Surgeon: Micheline Chapman, MD;  Location: [facility] CATH LAB;  Service: Cardiovascular;  Laterality: N/A;   LUMBAR LAMINECTOMY/DECOMPRESSION MICRODISCECTOMY Left 11/02/2017   Procedure: MICRODISCECTOMY LUMBAR THREE- LUMBAR FOUR, LEFT;  Surgeon: Coletta Memos, MD;   Location: [facility] OR;  Service: Neurosurgery;  Laterality: Left;   NASAL SEPTUM SURGERY  1979   PATCH ANGIOPLASTY Left 04/23/2015   Procedure: PATCH ANGIOPLASTY USING 1CM X 6CM XENOSURE BIOLOGIC PATCH;  Surgeon: Fransisco Hertz, MD;  Location: [facility] OR;  Service: Vascular;  Laterality: Left;   POSTERIOR LUMBAR FUSION  1964   SHOULDER OPEN ROTATOR CUFF REPAIR  10/14/2011   Procedure: ROTATOR CUFF REPAIR SHOULDER OPEN;  Surgeon: Javier Docker, MD;  Location: [facility] ORS;  Service: Orthopedics;  Laterality: Right;   SUBACROMIAL DECOMPRESSION  10/14/2011   Procedure: SUBACROMIAL DECOMPRESSION;  Surgeon: Javier Docker, MD;  Location: [facility] OR[facility];  Service: Orthopedics;  Laterality: Right;   TRIGGER FINGER RELEASE Right ~ 2013-2014   "3rd & 4th digits"   Patient Active Problem List   Diagnosis Date Noted   Coronary artery disease involving native coronary artery of native heart with angina pectoris (HCC) 03/07/2018   HNP (herniated nucleus pulposus), lumbar 11/02/2017   Carotid artery disease without cerebral infarction (HCC) 04/09/2015   Chest pain of uncertain etiology 06/21/2014   Diabetic neuropathy (HCC) 06/21/2014   Essential hypertension 06/21/2014   Hypercholesterolemia 06/21/2014   Rotator cuff tear 10/15/2011   IDDM (insulin dependent diabetes mellitus) 10/16/2008    PCP: Geoffry Paradise, MD   REFERRING PROVIDER: Geoffry Paradise, MD   REFERRING DIAG: G89.29 other chronic pain, M54.40 Lumbago with sciatica, unspecified, M25.562 (ICD-10-CM) - Pain in left knee M25.561 (ICD-10-CM) - Pain in right knee  THERAPY DIAG:  Chronic pain of both knees  Other low back pain  Muscle weakness (generalized)  Difficulty in walking, not elsewhere classified  Rationale for Evaluation and Treatment: Rehabilitation  ONSET DATE: PT order 05/11/2022 / Surgery March 2022   SUBJECTIVE:   SUBJECTIVE STATEMENT: Pt states his walking improvement comes and goes.  He has been working on his posture with gait.  He  occasionally doesn't need his crutch in his home.  Pt is more compliant with HEP though doesn't perform all of them consistently.  Pt reports a 30% improvement in performance of and tolerance with functional mobility.  Pt reports his standing is better at times.   Pt continues to report difficulty turning with gait and states it is better at times.  "Sometimes knees are bad, sometimes are good."   Pt is still limited with community mobility.          PERTINENT HISTORY: -3 back surgeries:  L3-5 laminotomy and pt thinks he has some screws in lumbar March 2022, Lumbar laminectomy/decompression/microdisctomy in 2019, Lumbar surgery in 1964   -OA, Bilat knee pain R > L, CAD, Peripheral neuropathy, Vertigo, Pseudogout, DM type II and diabetic retinopathy,    -  Pt has Insulin monitor and tremors in bilat Ue's R > L   -PSHx:  R shoulder surgery in 2013 and was unable to repair rotator cuff, Coronary angioplasty with stent placement in 2015 and 2019, and L knee arthroscopy in 2012  PAIN:   Lumbar:  0/10 pain currently, 3/10 worst  Knee pain:  0/10 current, 7/10 worst  PRECAUTIONS: Other: multiple back surgeries, peripheral neuropathy, R shoulder RCR   WEIGHT BEARING RESTRICTIONS: No  FALLS:  Has patient fallen in last 6 months? No  LIVING ENVIRONMENT: Lives with: lives with their spouse Lives in: 1 story home Stairs: 3-4 steps with rail Has following equipment at home: Single point cane, Environmental consultant - 2 wheeled, and Crutches  OCCUPATION: Pt works from home.   PLOF: Independent; Pt began using SPC in early 2019.  Pt was able to perform daily mobility and daily activities with greater ease and less difficulty   PATIENT GOALS: to improve strength in legs and get rid of his crutch   OBJECTIVE:  Note: Objective measures were completed at Evaluation unless otherwise noted.  DIAGNOSTIC FINDINGS:  L knee x ray in 11/23: IMPRESSION: 1. Severe patellofemoral and mild-to-moderate medial and  lateral compartment osteoarthritis. 2. Small joint effusion.  R knee x ray in 11/23: IMPRESSION: 1. Severe patellofemoral and mild to moderate medial and lateral compartment osteoarthritis. 2. Small joint effusion.  Lumbar: FINDINGS: Segmentation:  Standard.   Alignment:  Trace anterolisthesis L3 on L4.   Vertebrae: No fracture, evidence of discitis, or bone lesion. There is some degenerative endplate signal change at L3-4.   Conus medullaris and cauda equina: Conus extends to the T12 level. Conus and cauda equina appear normal.   Paraspinal and other soft tissues: Negative.   Disc levels:   T11-12 and T12-L1 are imaged in the sagittal plane only and negative.   L1-2: Minimal disc bulge without stenosis.   L2-3: Shallow disc bulge and mild ligamentum flavum thickening. Mild central canal and left foraminal narrowing appear unchanged. The right foramen is open.   L3-4: Status post left laminotomy. There has been progression of disease at this level. A new and very large central left paracentral disc protrusion extends into the left foramen. There is severe compression of the thecal sac and narrowing in the lateral recesses, worse on the left. Moderate bilateral foraminal narrowing is worse on the left.   L4-5: Status post left laminotomy as seen on the prior exam. There is a shallow broad-based disc bulge. The narrowing in the subarticular recesses is present which could impact either descending L5 root. Mild bilateral foraminal narrowing. No change.   L5-S1: A right subarticular recess protrusion impinging on the right S1 root appears unchanged. Foramina are open.   IMPRESSION: Since the prior MRI, the patient has developed a large central and left paracentral disc protrusion at L3-4 causing severe compression of the thecal sac and narrowing in the lateral recesses, worse on the left. Moderate bilateral foraminal narrowing at this level is worse on the left.  Prior left laminotomy noted.   No change in a broad-based disc bulge at L4-5 causing narrowing in the subarticular recesses which could impact either descending L5 root. Left laminotomy defect is seen at this level.   No change in a right subarticular recess protrusion at L5-S1. The disc appears to impinge on the right S1 root.   TODAY'S TREATMENT:  Reviewed current function, pain levels, and HEP compliance.   PATIENT SURVEYS:  FOTO 47 with a goal of 58 at visit 14     LOWER EXTREMITY MMT:   MMT Right eval Left eval Right 1/29 Left 1/29  Hip flexion 32.1 27.4 40.6 34.8  Hip extension        Hip abduction 22.6 32.5 32.4 38.1  Hip adduction        Hip internal rotation        Hip external rotation        Knee flexion 20.4 seated 27.2 seated 29.1 31.0  Knee extension 4+/5; 37.4 4+/5; 40.8 4+/5; 41.2 5/5; 43.2  Ankle dorsiflexion        Ankle plantarflexion        Ankle inversion        Ankle eversion         (Blank rows = not tested)     FUNCTIONAL TESTS:   6 MWT:  Prior PT:  Pt ambulated 547 ft with SPC.  Pt had no LOB.  Pt stopped at 4 min and 30 sec. Initial:  Pt ambulated 220 ft with lofstrand crutch.  Pt stopped at 2 min 30 sec. Current:  Pt ambulated 600 feet with lofstrand crutch with 2 seated rest breaks.    GAIT: Assistive device utilized:  1 lofstrand crutch   Comments: slow gait, decreased TKE bilat, decreased step length bilat   PATIENT EDUCATION:  Education details: Objective findings, goal progress, gait, rationale of interventions, importance of performing HEP, dx, and POC. Person educated: Patient Education method: Explanation, verbal cues, demonstration Education comprehension: verbalized understanding, returned demonstration, verbal cues required  HOME EXERCISE PROGRAM: Pt already has a HEP from prior PT.  Access Code:  [email or web address]   ASSESSMENT:  CLINICAL IMPRESSION: Pt is progressing with mobility, strength, and tolerance to activity.  Pt has improved worst pain from 7/10 initially to 3/10 currently.  He is not completely consistent with HEP though is improving compliance with exercises.  He reports improved gait and does not need his crutch sometimes with gait at home.  Pt reports a 30% improvement in performance of and tolerance with functional mobility.  He is still limited with community ambulation.  He continues to have difficulty turning with gait.  Pt demonstrates improved ambulation distance by 380 feet on the 6 MWT.  Pt demonstrates improved strength in bilat LE's with R > L as evidenced by HHD testing.  Pt met STG's #1,3 and partially met STG #2 and LTG's #3,4.  PT added a new 6 MWT goal.  Pt should benefit from cont skilled PT to improve strength, functional mobility, gait, stability, and tolerance to activity.      OBJECTIVE IMPAIRMENTS: Abnormal gait, decreased activity tolerance, decreased balance, decreased endurance, decreased mobility, difficulty walking, decreased ROM, decreased strength, hypomobility, impaired flexibility, and pain.   ACTIVITY LIMITATIONS: standing, squatting, stairs, transfers, and locomotion level  PARTICIPATION LIMITATIONS: meal prep, cleaning, shopping, and community activity  PERSONAL FACTORS: Time since onset of injury/illness/exacerbation and 3+ comorbidities:  multiple back surgeries, OA, Peripheral neuropathy, Pseudogout, DM type II  are also affecting patient's functional outcome.   REHAB POTENTIAL: Good   CLINICAL DECISION MAKING: Evolving/moderate complexity   EVALUATION COMPLEXITY: Moderate   GOALS:   SHORT TERM GOALS: Target date: 07/14/2023  Pt will report improved compliance with HEP for improved strength, pain, mobility, and function.  Baseline: Goal status: GOAL MET  1/29  2.  Pt will report at least a 40%  improvement in performance of and tolerance  with functional mobility.  Baseline:  Goal status:  75% MET  3.  Pt will increase ambulation distance by at least 100 ft for improved tolerance with community ambulation.  Baseline:  Goal status: GOAL MET  1/29    LONG TERM GOALS: Target date: 10/19/2023  Pt will be able to turn with gait with improved ease and good stability.  Baseline:  Goal status: NOT MET  1/29  2.  Pt will report at least a 50% improvement in tolerance with standing.  Baseline:  Goal status:  PROGRESSING  1/29  3.  Pt will demo at least an 8-10# increase in bilat hip flex and abd strength, 5-10# increase in knee flexion, and 5/5 in knee extension bilat strength for improved performance of and tolerance with functional mobility.  Baseline:  Goal status: 50% MET   1/29  4.  Pt will be able to ambulate > 600 ft on 6 MWT for improved community ambulation.   Baseline:  Goal status: 95% MET   1/29  5.  Pt will ambulate at least 670 ft on the 6 MWT for improved ambulation distance.   Goal status:  INITIAL    PLAN:  PT FREQUENCY: 1x/wk   PT DURATION: 6 weeks  PLANNED INTERVENTIONS: Therapeutic exercises, Therapeutic activity, Neuromuscular re-education, Balance training, Gait training, Patient/Family education, Self Care, Joint mobilization, Stair training, Aquatic Therapy, Dry Needling, Electrical stimulation, Cryotherapy, Moist heat, Taping, Manual therapy, and Re-evaluation.   PLAN FOR NEXT SESSION:  LE strengthening, gait, balance, and improve standing tolerance.    Audie Clear III PT, DPT 09/07/23 8:30 PM

## 2023-09-11 DIAGNOSIS — E1129 Type 2 diabetes mellitus with other diabetic kidney complication: Secondary | ICD-10-CM | POA: Diagnosis not present

## 2023-09-14 ENCOUNTER — Ambulatory Visit (HOSPITAL_BASED_OUTPATIENT_CLINIC_OR_DEPARTMENT_OTHER): Payer: Medicare Other | Attending: Internal Medicine | Admitting: Physical Therapy

## 2023-09-14 ENCOUNTER — Encounter (HOSPITAL_BASED_OUTPATIENT_CLINIC_OR_DEPARTMENT_OTHER): Payer: Self-pay | Admitting: Physical Therapy

## 2023-09-14 DIAGNOSIS — G8929 Other chronic pain: Secondary | ICD-10-CM | POA: Insufficient documentation

## 2023-09-14 DIAGNOSIS — M25562 Pain in left knee: Secondary | ICD-10-CM | POA: Diagnosis not present

## 2023-09-14 DIAGNOSIS — M25561 Pain in right knee: Secondary | ICD-10-CM | POA: Diagnosis not present

## 2023-09-14 DIAGNOSIS — M6281 Muscle weakness (generalized): Secondary | ICD-10-CM | POA: Diagnosis not present

## 2023-09-14 DIAGNOSIS — M5459 Other low back pain: Secondary | ICD-10-CM | POA: Diagnosis not present

## 2023-09-14 DIAGNOSIS — R262 Difficulty in walking, not elsewhere classified: Secondary | ICD-10-CM | POA: Insufficient documentation

## 2023-09-14 NOTE — Therapy (Signed)
 OUTPATIENT PHYSICAL THERAPY TREATMENT      Patient Name: Seth Young MRN: [id number] DOB:12-19-1940, 83 y.o., male Today's Date: 09/15/2023  END OF SESSION:  PT End of Session - 09/14/23 1324     Visit Number 7    Number of Visits 12    Date for PT Re-Evaluation 10/19/23    Authorization Type [employer] MCR    PT Start Time 1320    PT Stop Time 1359    PT Time Calculation (min) 39 min    Activity Tolerance Patient tolerated treatment well    Behavior During Therapy WFL for tasks assessed/performed                 Past Medical History:  Diagnosis Date   Arthritis    knees, toes, hands, probably in my back (03/09/2018)   Ataxia    Basal cell carcinoma    left temporal area-no residual   Colon polyps 10-12-11   past hx.   Coronary artery disease    Exertional angina (HCC) 06/26/2014   Cath with DES to the OM, Bioflow protocol    GERD (gastroesophageal reflux disease)    H/O hiatal hernia    no problems   High cholesterol    History of echocardiogram    Echo 7/19:  Mild LVH, mild focal basal septal hypertrophy, EF 60-65, no RWMA, Gr 1 DD, trivial AI, MAC   Hypertension    Ischemic chest pain (HCC) 08/07/2014   Occasional tremors    Bilateral hands   Peripheral neuropathy    Pneumonia 1940's X 2; ~ 2017   infant; walking pneumonia (03/09/2018)   Pseudogout    it moves around   Sleep apnea    no cpap used   Type II diabetes mellitus (HCC) dx'd 1982   nsulin started ~ 2000   Vertigo    Past Surgical History:  Procedure Laterality Date   BACK SURGERY     BASAL CELL CARCINOMA EXCISION Left ~ 2012   face   CARDIAC CATHETERIZATION  02/2005   Dr. Dominick; negative.   CARDIAC CATHETERIZATION N/A 05/13/2016   Procedure: Left Heart Cath and Coronary Angiography;  Surgeon: Oneil JAYSON Parchment, MD;  Location: [facility];  Service: Cardiovascular;  Laterality: N/A;   CARPAL TUNNEL RELEASE Left 10/2009   CARPAL TUNNEL RELEASE Right ~ 2013   CATARACT EXTRACTION W/  INTRAOCULAR LENS IMPLANT Left 2000's   CATARACT EXTRACTION W/ INTRAOCULAR LENS IMPLANT Right 2015   COLONOSCOPY W/ POLYPECTOMY     CORONARY ANGIOPLASTY WITH STENT PLACEMENT  06/26/2014   1   CORONARY ANGIOPLASTY WITH STENT PLACEMENT  06/26/2014   pLAD 50%, d LAD 60%, oD1 80%,  mD1  70%, CFX 50%, OM 2 70%,  RCA 80%, PDA 95% (<31mm), OM1 99%-0% with Bio flow stent        CORONARY ANGIOPLASTY WITH STENT PLACEMENT  03/09/2018   CORONARY STENT INTERVENTION N/A 03/09/2018   Procedure: CORONARY STENT INTERVENTION;  Surgeon: Verlin Lonni BIRCH, MD;  Location: [facility];  Service: Cardiovascular;  Laterality: N/A;   ELBOW SURGERY Bilateral ~ 2014   for blockages; Dr. Camella   ENDARTERECTOMY Left 04/23/2015   Procedure: ENDARTERECTOMY CAROTID;  Surgeon: Redell LITTIE Door, MD;  Location: [facility] OR;  Service: Vascular;  Laterality: Left;   EYE SURGERY Left    related go diabetes; laser   KNEE ARTHROSCOPY Left 05/2011   LEFT HEART CATH AND CORONARY ANGIOGRAPHY N/A 03/09/2018   Procedure: LEFT HEART CATH  AND CORONARY ANGIOGRAPHY;  Surgeon: Verlin Lonni BIRCH, MD;  Location: [facility];  Service: Cardiovascular;  Laterality: N/A;   LEFT HEART CATHETERIZATION WITH CORONARY ANGIOGRAM N/A 06/26/2014   Procedure: LEFT HEART CATHETERIZATION WITH CORONARY ANGIOGRAM;  Surgeon: Ozell BIRCH Fell, MD;  Location: [facility] CATH LAB;  Service: Cardiovascular;  Laterality: N/A;   LEFT HEART CATHETERIZATION WITH CORONARY ANGIOGRAM N/A 08/07/2014   Procedure: LEFT HEART CATHETERIZATION WITH CORONARY ANGIOGRAM;  Surgeon: Ozell BIRCH Fell, MD;  Location: [facility] CATH LAB;  Service: Cardiovascular;  Laterality: N/A;   LUMBAR LAMINECTOMY/DECOMPRESSION MICRODISCECTOMY Left 11/02/2017   Procedure: MICRODISCECTOMY LUMBAR THREE- LUMBAR FOUR, LEFT;  Surgeon: Gillie Duncans, MD;  Location: [facility] OR;  Service: Neurosurgery;  Laterality: Left;   NASAL SEPTUM SURGERY  1979   PATCH ANGIOPLASTY Left 04/23/2015   Procedure: PATCH  ANGIOPLASTY USING 1CM X 6CM XENOSURE BIOLOGIC PATCH;  Surgeon: Redell LITTIE Door, MD;  Location: [facility] OR;  Service: Vascular;  Laterality: Left;   POSTERIOR LUMBAR FUSION  1964   SHOULDER OPEN ROTATOR CUFF REPAIR  10/14/2011   Procedure: ROTATOR CUFF REPAIR SHOULDER OPEN;  Surgeon: Reyes JAYSON Billing, MD;  Location: [facility] ORS;  Service: Orthopedics;  Laterality: Right;   SUBACROMIAL DECOMPRESSION  10/14/2011   Procedure: SUBACROMIAL DECOMPRESSION;  Surgeon: Reyes JAYSON Billing, MD;  Location: [facility] ORS;  Service: Orthopedics;  Laterality: Right;   TRIGGER FINGER RELEASE Right ~ 2013-2014   3rd & 4th digits   Patient Active Problem List   Diagnosis Date Noted   Coronary artery disease involving native coronary artery of native heart with angina pectoris (HCC) 03/07/2018   HNP (herniated nucleus pulposus), lumbar 11/02/2017   Carotid artery disease without cerebral infarction (HCC) 04/09/2015   Chest pain of uncertain etiology 06/21/2014   Diabetic neuropathy (HCC) 06/21/2014   Essential hypertension 06/21/2014   Hypercholesterolemia 06/21/2014   Rotator cuff tear 10/15/2011   IDDM (insulin  dependent diabetes mellitus) 10/16/2008    PCP: Shepard Ade, MD   REFERRING PROVIDER: Shepard Ade, MD   REFERRING DIAG: G89.29 other chronic pain, M54.40 Lumbago with sciatica, unspecified, M25.562 (ICD-10-CM) - Pain in left knee M25.561 (ICD-10-CM) - Pain in right knee  THERAPY DIAG:  Chronic pain of both knees  Other low back pain  Muscle weakness (generalized)  Difficulty in walking, not elsewhere classified  Rationale for Evaluation and Treatment: Rehabilitation  ONSET DATE: PT order 05/11/2022 / Surgery March 2022   SUBJECTIVE:   SUBJECTIVE STATEMENT: Pt states his R foot bothered him for about 3 days after prior Rx.  Pt denies pain currently.  His walking improvement comes and goes.  He has been working on his posture with gait.  He occasionally doesn't need his crutch in his home.  Pt is still  limited with community mobility.          PERTINENT HISTORY: -3 back surgeries:  L3-5 laminotomy and pt thinks he has some screws in lumbar March 2022, Lumbar laminectomy/decompression/microdisctomy in 2019, Lumbar surgery in 1964   -OA, Bilat knee pain R > L, CAD, Peripheral neuropathy, Vertigo, Pseudogout, DM type II and diabetic retinopathy,    -Pt has Insulin  monitor and tremors in bilat Ue's R > L   -PSHx:  R shoulder surgery in 2013 and was unable to repair rotator cuff, Coronary angioplasty with stent placement in 2015 and 2019, and L knee arthroscopy in 2012  PAIN:   Lumbar:  0/10 pain currently, 3/10 worst  Knee pain:  0/10 current, 7/10 worst  PRECAUTIONS: Other: multiple  back surgeries, peripheral neuropathy, R shoulder RCR   WEIGHT BEARING RESTRICTIONS: No  FALLS:  Has patient fallen in last 6 months? No  LIVING ENVIRONMENT: Lives with: lives with their spouse Lives in: 1 story home Stairs: 3-4 steps with rail Has following equipment at home: Single point cane, Environmental Consultant - 2 wheeled, and Crutches  OCCUPATION: Pt works from home.   PLOF: Independent; Pt began using SPC in early 2019.  Pt was able to perform daily mobility and daily activities with greater ease and less difficulty   PATIENT GOALS: to improve strength in legs and get rid of his crutch   OBJECTIVE:  Note: Objective measures were completed at Evaluation unless otherwise noted.  DIAGNOSTIC FINDINGS:  L knee x ray in 11/23: IMPRESSION: 1. Severe patellofemoral and mild-to-moderate medial and lateral compartment osteoarthritis. 2. Small joint effusion.  R knee x ray in 11/23: IMPRESSION: 1. Severe patellofemoral and mild to moderate medial and lateral compartment osteoarthritis. 2. Small joint effusion.  Lumbar: FINDINGS: Segmentation:  Standard.   Alignment:  Trace anterolisthesis L3 on L4.   Vertebrae: No fracture, evidence of discitis, or bone lesion. There is some degenerative  endplate signal change at L3-4.   Conus medullaris and cauda equina: Conus extends to the T12 level. Conus and cauda equina appear normal.   Paraspinal and other soft tissues: Negative.   Disc levels:   T11-12 and T12-L1 are imaged in the sagittal plane only and negative.   L1-2: Minimal disc bulge without stenosis.   L2-3: Shallow disc bulge and mild ligamentum flavum thickening. Mild central canal and left foraminal narrowing appear unchanged. The right foramen is open.   L3-4: Status post left laminotomy. There has been progression of disease at this level. A new and very large central left paracentral disc protrusion extends into the left foramen. There is severe compression of the thecal sac and narrowing in the lateral recesses, worse on the left. Moderate bilateral foraminal narrowing is worse on the left.   L4-5: Status post left laminotomy as seen on the prior exam. There is a shallow broad-based disc bulge. The narrowing in the subarticular recesses is present which could impact either descending L5 root. Mild bilateral foraminal narrowing. No change.   L5-S1: A right subarticular recess protrusion impinging on the right S1 root appears unchanged. Foramina are open.   IMPRESSION: Since the prior MRI, the patient has developed a large central and left paracentral disc protrusion at L3-4 causing severe compression of the thecal sac and narrowing in the lateral recesses, worse on the left. Moderate bilateral foraminal narrowing at this level is worse on the left. Prior left laminotomy noted.   No change in a broad-based disc bulge at L4-5 causing narrowing in the subarticular recesses which could impact either descending L5 root. Left laminotomy defect is seen at this level.   No change in a right subarticular recess protrusion at L5-S1. The disc appears to impinge on the right S1 root.   TODAY'S TREATMENT:  Ther - Ex: Nustep L4 just LE's x 6 mins Sidestepping with bilat UE support on rail x 4 laps with RTB proximal to knees  LAQ with 3# 3x10 bilat Seated HS curls with GTB 3 x 10 reps bilat Seated HS stretch 2x30 sec bilat  Sit to stands 3x5 reps from elevated table with UE support     Neuro Re-ed Actvities: Standing on airex with NBOS 2x50 sec with intermittent UE support and CGA Standing on airex with manual perturbations 3x30 sec with intermittent UE support Step ups on airex fwd and lateral x10 reps each with UE support   PATIENT EDUCATION:  Education details: Objective findings, goal progress, gait, rationale of interventions, importance of performing HEP, dx, and POC. Person educated: Patient Education method: Explanation, verbal cues, demonstration Education comprehension: verbalized understanding, returned demonstration, verbal cues required  HOME EXERCISE PROGRAM: Pt already has a HEP from prior PT.  Access Code: [email or web address]   ASSESSMENT:  CLINICAL IMPRESSION: Pt continues to be limited with community ambulation and becomes fatigued with ambulation.  Pt is improving with tolerance to exercises.  He is improving with sit to stand transfers.  Pt gave good effort with all exercises.  He could feel his knees with fwd step ups on airex.  He performed exercises well and had good tolerance with exercises.  Pt responded well to Rx having no increased pain or c/o's after Rx.  Pt should benefit from cont skilled PT to improve strength, functional mobility, gait, stability, and tolerance to activity.      OBJECTIVE IMPAIRMENTS: Abnormal gait, decreased activity tolerance, decreased balance, decreased endurance, decreased mobility, difficulty walking, decreased ROM, decreased strength, hypomobility, impaired flexibility, and pain.   ACTIVITY LIMITATIONS: standing, squatting, stairs, transfers, and locomotion  level  PARTICIPATION LIMITATIONS: meal prep, cleaning, shopping, and community activity  PERSONAL FACTORS: Time since onset of injury/illness/exacerbation and 3+ comorbidities:  multiple back surgeries, OA, Peripheral neuropathy, Pseudogout, DM type II  are also affecting patient's functional outcome.   REHAB POTENTIAL: Good   CLINICAL DECISION MAKING: Evolving/moderate complexity   EVALUATION COMPLEXITY: Moderate   GOALS:   SHORT TERM GOALS: Target date: 07/14/2023  Pt will report improved compliance with HEP for improved strength, pain, mobility, and function.  Baseline: Goal status: GOAL MET  1/29  2.  Pt will report at least a 40% improvement in performance of and tolerance with functional mobility.  Baseline:  Goal status:  75% MET  3.  Pt will increase ambulation distance by at least 100 ft for improved tolerance with community ambulation.  Baseline:  Goal status: GOAL MET  1/29    LONG TERM GOALS: Target date: 10/19/2023  Pt will be able to turn with gait with improved ease and good stability.  Baseline:  Goal status: NOT MET  1/29  2.  Pt will report at least a 50% improvement in tolerance with standing.  Baseline:  Goal status:  PROGRESSING  1/29  3.  Pt will demo at least an 8-10# increase in bilat hip flex and abd strength, 5-10# increase in knee flexion, and 5/5 in knee extension bilat strength for improved performance of and tolerance with functional mobility.  Baseline:  Goal status: 50% MET   1/29  4.  Pt will be able to ambulate > 600 ft on 6 MWT for improved community ambulation.   Baseline:  Goal status: 95% MET   1/29  5.  Pt will ambulate at least 670 ft on the 6 MWT for improved ambulation distance.  Goal status:  INITIAL    PLAN:  PT FREQUENCY: 1x/wk   PT DURATION: 6 weeks  PLANNED INTERVENTIONS: Therapeutic exercises, Therapeutic activity, Neuromuscular re-education, Balance training, Gait training, Patient/Family education, Self  Care, Joint mobilization, Stair training, Aquatic Therapy, Dry Needling, Electrical stimulation, Cryotherapy, Moist heat, Taping, Manual therapy, and Re-evaluation.   PLAN FOR NEXT SESSION:  LE strengthening, gait, balance, and improve standing tolerance.    Leigh Minerva III PT, DPT 09/15/23 6:51 PM

## 2023-09-21 ENCOUNTER — Encounter (HOSPITAL_BASED_OUTPATIENT_CLINIC_OR_DEPARTMENT_OTHER): Payer: Medicare Other | Admitting: Physical Therapy

## 2023-09-26 ENCOUNTER — Other Ambulatory Visit: Payer: Self-pay

## 2023-09-26 MED ORDER — NITROGLYCERIN 0.4 MG SL SUBL: 0.4000 mg | SUBLINGUAL_TABLET | SUBLINGUAL | 2 refills | Status: AC | PRN

## 2023-09-28 ENCOUNTER — Encounter (HOSPITAL_BASED_OUTPATIENT_CLINIC_OR_DEPARTMENT_OTHER): Payer: Medicare Other | Admitting: Physical Therapy

## 2023-09-30 ENCOUNTER — Ambulatory Visit (HOSPITAL_BASED_OUTPATIENT_CLINIC_OR_DEPARTMENT_OTHER): Payer: Medicare Other | Admitting: Orthopaedic Surgery

## 2023-10-05 ENCOUNTER — Ambulatory Visit (HOSPITAL_BASED_OUTPATIENT_CLINIC_OR_DEPARTMENT_OTHER): Payer: Medicare Other | Admitting: Orthopaedic Surgery

## 2023-10-05 ENCOUNTER — Ambulatory Visit (HOSPITAL_BASED_OUTPATIENT_CLINIC_OR_DEPARTMENT_OTHER): Payer: Medicare Other | Admitting: Physical Therapy

## 2023-10-05 ENCOUNTER — Encounter (HOSPITAL_BASED_OUTPATIENT_CLINIC_OR_DEPARTMENT_OTHER): Payer: Self-pay | Admitting: Physical Therapy

## 2023-10-05 DIAGNOSIS — M25562 Pain in left knee: Secondary | ICD-10-CM

## 2023-10-05 DIAGNOSIS — M6281 Muscle weakness (generalized): Secondary | ICD-10-CM

## 2023-10-05 DIAGNOSIS — G8929 Other chronic pain: Secondary | ICD-10-CM

## 2023-10-05 DIAGNOSIS — M5459 Other low back pain: Secondary | ICD-10-CM

## 2023-10-05 DIAGNOSIS — R262 Difficulty in walking, not elsewhere classified: Secondary | ICD-10-CM

## 2023-10-05 DIAGNOSIS — M25561 Pain in right knee: Secondary | ICD-10-CM | POA: Diagnosis not present

## 2023-10-05 DIAGNOSIS — M170 Bilateral primary osteoarthritis of knee: Secondary | ICD-10-CM | POA: Diagnosis not present

## 2023-10-05 MED ORDER — TRIAMCINOLONE ACETONIDE 40 MG/ML IJ SUSP
80.0000 mg | INTRAMUSCULAR | Status: AC | PRN
Start: 2023-10-05 — End: 2023-10-05
  Administered 2023-10-05: 80 mg via INTRA_ARTICULAR

## 2023-10-05 MED ORDER — LIDOCAINE HCL 1 % IJ SOLN
4.0000 mL | INTRAMUSCULAR | Status: AC | PRN
Start: 2023-10-05 — End: 2023-10-05
  Administered 2023-10-05: 4 mL

## 2023-10-05 NOTE — Therapy (Signed)
 OUTPATIENT PHYSICAL THERAPY TREATMENT      Patient Name: Seth Young MRN: [id number] DOB:1941/08/07, 83 y.o., male Today's Date: 10/05/2023  END OF SESSION:  PT End of Session - 10/05/23 1332     Visit Number 7    PT Start Time 1319                  Past Medical History:  Diagnosis Date   Arthritis    "knees, toes, hands, probably in my back" (03/09/2018)   Ataxia    Basal cell carcinoma    left temporal area-no residual   Colon polyps 10-12-11   past hx.   Coronary artery disease    Exertional angina (HCC) 06/26/2014   Cath with DES to the OM, Bioflow protocol    GERD (gastroesophageal reflux disease)    H/O hiatal hernia    no problems   High cholesterol    History of echocardiogram    Echo 7/19:  Mild LVH, mild focal basal septal hypertrophy, EF 60-65, no RWMA, Gr 1 DD, trivial AI, MAC   Hypertension    Ischemic chest pain (HCC) 08/07/2014   Occasional tremors    Bilateral hands   Peripheral neuropathy    Pneumonia 1940's X 2; ~ 2017   "infant; walking pneumonia" (03/09/2018)   Pseudogout    "it moves around"   Sleep apnea    no cpap used   Type II diabetes mellitus (HCC) dx'd 1982   nsulin started ~ 2000   Vertigo    Past Surgical History:  Procedure Laterality Date   BACK SURGERY     BASAL CELL CARCINOMA EXCISION Left ~ 2012   face   CARDIAC CATHETERIZATION  02/2005   Dr. Patty Sermons; negative.   CARDIAC CATHETERIZATION N/A 05/13/2016   Procedure: Left Heart Cath and Coronary Angiography;  Surgeon: Jake Bathe, MD;  Location: [facility];  Service: Cardiovascular;  Laterality: N/A;   CARPAL TUNNEL RELEASE Left 10/2009   CARPAL TUNNEL RELEASE Right ~ 2013   CATARACT EXTRACTION W/ INTRAOCULAR LENS IMPLANT Left 2000's   CATARACT EXTRACTION W/ INTRAOCULAR LENS IMPLANT Right 2015   COLONOSCOPY W/ POLYPECTOMY     CORONARY ANGIOPLASTY WITH STENT PLACEMENT  06/26/2014   "1"   CORONARY ANGIOPLASTY WITH STENT PLACEMENT  06/26/2014   pLAD 50%, d  LAD 60%, oD1 80%,  mD1  70%, CFX 50%, OM 2 70%,  RCA 80%, PDA 95% (<45mm), OM1 99%-0% with Bio flow stent        CORONARY ANGIOPLASTY WITH STENT PLACEMENT  03/09/2018   CORONARY STENT INTERVENTION N/A 03/09/2018   Procedure: CORONARY STENT INTERVENTION;  Surgeon: Kathleene Hazel, MD;  Location: [facility];  Service: Cardiovascular;  Laterality: N/A;   ELBOW SURGERY Bilateral ~ 2014   "for blockages; Dr. Amanda Pea"   ENDARTERECTOMY Left 04/23/2015   Procedure: ENDARTERECTOMY CAROTID;  Surgeon: Fransisco Hertz, MD;  Location: [facility] OR;  Service: Vascular;  Laterality: Left;   EYE SURGERY Left    "related go diabetes; laser"   KNEE ARTHROSCOPY Left 05/2011   LEFT HEART CATH AND CORONARY ANGIOGRAPHY N/A 03/09/2018   Procedure: LEFT HEART CATH AND CORONARY ANGIOGRAPHY;  Surgeon: Kathleene Hazel, MD;  Location: [facility];  Service: Cardiovascular;  Laterality: N/A;   LEFT HEART CATHETERIZATION WITH CORONARY ANGIOGRAM N/A 06/26/2014   Procedure: LEFT HEART CATHETERIZATION WITH CORONARY ANGIOGRAM;  Surgeon: Micheline Chapman, MD;  Location: [facility];  Service: Cardiovascular;  Laterality:  N/A;   LEFT HEART CATHETERIZATION WITH CORONARY ANGIOGRAM N/A 08/07/2014   Procedure: LEFT HEART CATHETERIZATION WITH CORONARY ANGIOGRAM;  Surgeon: Micheline Chapman, MD;  Location: [facility] CATH LAB;  Service: Cardiovascular;  Laterality: N/A;   LUMBAR LAMINECTOMY/DECOMPRESSION MICRODISCECTOMY Left 11/02/2017   Procedure: MICRODISCECTOMY LUMBAR THREE- LUMBAR FOUR, LEFT;  Surgeon: Coletta Memos, MD;  Location: [facility] OR;  Service: Neurosurgery;  Laterality: Left;   NASAL SEPTUM SURGERY  1979   PATCH ANGIOPLASTY Left 04/23/2015   Procedure: PATCH ANGIOPLASTY USING 1CM X 6CM XENOSURE BIOLOGIC PATCH;  Surgeon: Fransisco Hertz, MD;  Location: [facility] OR;  Service: Vascular;  Laterality: Left;   POSTERIOR LUMBAR FUSION  1964   SHOULDER OPEN ROTATOR CUFF REPAIR  10/14/2011   Procedure: ROTATOR CUFF REPAIR SHOULDER OPEN;   Surgeon: Javier Docker, MD;  Location: [facility];  Service: Orthopedics;  Laterality: Right;   SUBACROMIAL DECOMPRESSION  10/14/2011   Procedure: SUBACROMIAL DECOMPRESSION;  Surgeon: Javier Docker, MD;  Location: [facility];  Service: Orthopedics;  Laterality: Right;   TRIGGER FINGER RELEASE Right ~ 2013-2014   "3rd & 4th digits"   Patient Active Problem List   Diagnosis Date Noted   Coronary artery disease involving native coronary artery of native heart with angina pectoris (HCC) 03/07/2018   HNP (herniated nucleus pulposus), lumbar 11/02/2017   Carotid artery disease without cerebral infarction (HCC) 04/09/2015   Chest pain of uncertain etiology 06/21/2014   Diabetic neuropathy (HCC) 06/21/2014   Essential hypertension 06/21/2014   Hypercholesterolemia 06/21/2014   Rotator cuff tear 10/15/2011   IDDM (insulin dependent diabetes mellitus) 10/16/2008    PCP: Geoffry Paradise, MD   REFERRING PROVIDER: Geoffry Paradise, MD   REFERRING DIAG: G89.29 other chronic pain, M54.40 Lumbago with sciatica, unspecified, M25.562 (ICD-10-CM) - Pain in left knee M25.561 (ICD-10-CM) - Pain in right knee  THERAPY DIAG:  Chronic pain of both knees  Other low back pain  Muscle weakness (generalized)  Difficulty in walking, not elsewhere classified  Rationale for Evaluation and Treatment: Rehabilitation  ONSET DATE: PT order 05/11/2022 / Surgery March 2022   SUBJECTIVE:   SUBJECTIVE STATEMENT: His R knee bothers him more than the left.  Pt states he just received cortisone shots to bilat knees at approx 11:00-11:30.        PERTINENT HISTORY: -3 back surgeries:  L3-5 laminotomy and pt thinks he has some screws in lumbar March 2022, Lumbar laminectomy/decompression/microdisctomy in 2019, Lumbar surgery in 1964   -OA, Bilat knee pain R > L, CAD, Peripheral neuropathy, Vertigo, Pseudogout, DM type II and diabetic retinopathy,    -Pt has Insulin monitor and tremors in bilat Ue's R > L   -PSHx:   R shoulder surgery in 2013 and was unable to repair rotator cuff, Coronary angioplasty with stent placement in 2015 and 2019, and L knee arthroscopy in 2012  PAIN:   Lumbar:  0/10 pain currently, 3/10 worst  Knee pain:  0/10 current, 7/10 worst  PRECAUTIONS: Other: multiple back surgeries, peripheral neuropathy, R shoulder RCR   WEIGHT BEARING RESTRICTIONS: No  FALLS:  Has patient fallen in last 6 months? No  LIVING ENVIRONMENT: Lives with: lives with their spouse Lives in: 1 story home Stairs: 3-4 steps with rail Has following equipment at home: Single point cane, Environmental consultant - 2 wheeled, and Crutches  OCCUPATION: Pt works from home.   PLOF: Independent; Pt began using SPC in early 2019.  Pt was able to perform daily mobility and daily activities with greater ease and  less difficulty   PATIENT GOALS: to improve strength in legs and get rid of his crutch   OBJECTIVE:  Note: Objective measures were completed at Evaluation unless otherwise noted.  DIAGNOSTIC FINDINGS:  L knee x ray in 11/23: IMPRESSION: 1. Severe patellofemoral and mild-to-moderate medial and lateral compartment osteoarthritis. 2. Small joint effusion.  R knee x ray in 11/23: IMPRESSION: 1. Severe patellofemoral and mild to moderate medial and lateral compartment osteoarthritis. 2. Small joint effusion.  Lumbar: FINDINGS: Segmentation:  Standard.   Alignment:  Trace anterolisthesis L3 on L4.   Vertebrae: No fracture, evidence of discitis, or bone lesion. There is some degenerative endplate signal change at L3-4.   Conus medullaris and cauda equina: Conus extends to the T12 level. Conus and cauda equina appear normal.   Paraspinal and other soft tissues: Negative.   Disc levels:   T11-12 and T12-L1 are imaged in the sagittal plane only and negative.   L1-2: Minimal disc bulge without stenosis.   L2-3: Shallow disc bulge and mild ligamentum flavum thickening. Mild central canal and left  foraminal narrowing appear unchanged. The right foramen is open.   L3-4: Status post left laminotomy. There has been progression of disease at this level. A new and very large central left paracentral disc protrusion extends into the left foramen. There is severe compression of the thecal sac and narrowing in the lateral recesses, worse on the left. Moderate bilateral foraminal narrowing is worse on the left.   L4-5: Status post left laminotomy as seen on the prior exam. There is a shallow broad-based disc bulge. The narrowing in the subarticular recesses is present which could impact either descending L5 root. Mild bilateral foraminal narrowing. No change.   L5-S1: A right subarticular recess protrusion impinging on the right S1 root appears unchanged. Foramina are open.   IMPRESSION: Since the prior MRI, the patient has developed a large central and left paracentral disc protrusion at L3-4 causing severe compression of the thecal sac and narrowing in the lateral recesses, worse on the left. Moderate bilateral foraminal narrowing at this level is worse on the left. Prior left laminotomy noted.   No change in a broad-based disc bulge at L4-5 causing narrowing in the subarticular recesses which could impact either descending L5 root. Left laminotomy defect is seen at this level.   No change in a right subarticular recess protrusion at L5-S1. The disc appears to impinge on the right S1 root.   TODAY'S TREATMENT:                                                                                                                                PT deferred treatment.  PATIENT EDUCATION:  Education details:  Using crutch/cane.  POC Person educated: Patient Education method: Explanation, verbal cues, demonstration Education comprehension: verbalized understanding, returned demonstration, verbal cues required  HOME EXERCISE PROGRAM: Pt already has a HEP from prior PT.  Access Code:  [email or web address]  ASSESSMENT:  CLINICAL IMPRESSION: PT deferred treatment today due to pt having US guided injections in bilat knees approx 2 hours ago.  Pt in agreement.     OBJECTIVE IMPAIRMENTS: Abnormal gait, decreased activity tolerance, decreased balance, decreased endurance, decreased mobility, difficulty walking, decreased ROM, decreased strength, hypomobility, impaired flexibility, and pain.   ACTIVITY LIMITATIONS: standing, squatting, stairs, transfers, and locomotion level  PARTICIPATION LIMITATIONS: meal prep, cleaning, shopping, and community activity  PERSONAL FACTORS: Time since onset of injury/illness/exacerbation and 3+ comorbidities:  multiple back surgeries, OA, Peripheral neuropathy, Pseudogout, DM type II  are also affecting patient's functional outcome.   REHAB POTENTIAL: Good   CLINICAL DECISION MAKING: Evolving/moderate complexity   EVALUATION COMPLEXITY: Moderate   GOALS:   SHORT TERM GOALS: Target date: 07/14/2023  Pt will report improved compliance with HEP for improved strength, pain, mobility, and function.  Baseline: Goal status: GOAL MET  1/29  2.  Pt will report at least a 40% improvement in performance of and tolerance with functional mobility.  Baseline:  Goal status:  75% MET  3.  Pt will increase ambulation distance by at least 100 ft for improved tolerance with community ambulation.  Baseline:  Goal status: GOAL MET  1/29    LONG TERM GOALS: Target date: 10/19/2023  Pt will be able to turn with gait with improved ease and good stability.  Baseline:  Goal status: NOT MET  1/29  2.  Pt will report at least a 50% improvement in tolerance with standing.  Baseline:  Goal status:  PROGRESSING  1/29  3.  Pt will demo at least an 8-10# increase in bilat hip flex and abd strength, 5-10# increase in knee flexion, and 5/5 in knee extension bilat strength for improved performance of and tolerance with functional mobility.  Baseline:  Goal status:  50% MET   1/29  4.  Pt will be able to ambulate > 600 ft on 6 MWT for improved community ambulation.   Baseline:  Goal status: 95% MET   1/29  5.  Pt will ambulate at least 670 ft on the 6 MWT for improved ambulation distance.   Goal status:  INITIAL    PLAN:  PT FREQUENCY: 1x/wk   PT DURATION: 6 weeks  PLANNED INTERVENTIONS: Therapeutic exercises, Therapeutic activity, Neuromuscular re-education, Balance training, Gait training, Patient/Family education, Self Care, Joint mobilization, Stair training, Aquatic Therapy, Dry Needling, Electrical stimulation, Cryotherapy, Moist heat, Taping, Manual therapy, and Re-evaluation.   PLAN FOR NEXT SESSION:  LE strengthening, gait, balance, and improve standing tolerance.    Audie Clear III PT, DPT 10/05/23 1:38 PM

## 2023-10-05 NOTE — Progress Notes (Signed)
 Chief Complaint: Right knee pain     History of Present Illness:   10/05/2023: Follow-up of bilateral knees.  He is hoping to have additional bilateral ultrasound-guided injections of the knees today.  We did also briefly discuss his shoulders this is remains symptomatic  Seth Young is a 83 y.o. male presents today with right worse than left knee pain which has been going on for several years.  He states that he did have an injection into the knees several years prior which did give him relief.  He has not had anything since then.  He has recently had a series of several back surgeries and as result is hoping to avoid any type of surgical intervention for the knees.  He is complaining of crepitus as well as pain going up and down stairs or with most activities of daily living.  He does enjoy traveling in a spare time although this has been limited as result of his knee pain.    Surgical History:   None  PMH/PSH/Family History/Social History/Meds/Allergies:    Past Medical History:  Diagnosis Date   Arthritis    "knees, toes, hands, probably in my back" (03/09/2018)   Ataxia    Basal cell carcinoma    left temporal area-no residual   Colon polyps 10-12-11   past hx.   Coronary artery disease    Exertional angina (HCC) 06/26/2014   Cath with DES to the OM, Bioflow protocol    GERD (gastroesophageal reflux disease)    H/O hiatal hernia    no problems   High cholesterol    History of echocardiogram    Echo 7/19:  Mild LVH, mild focal basal septal hypertrophy, EF 60-65, no RWMA, Gr 1 DD, trivial AI, MAC   Hypertension    Ischemic chest pain (HCC) 08/07/2014   Occasional tremors    Bilateral hands   Peripheral neuropathy    Pneumonia 1940's X 2; ~ 2017   "infant; walking pneumonia" (03/09/2018)   Pseudogout    "it moves around"   Sleep apnea    no cpap used   Type II diabetes mellitus (HCC) dx'd 1982   nsulin started ~ 2000   Vertigo     Past Surgical History:  Procedure Laterality Date   BACK SURGERY     BASAL CELL CARCINOMA EXCISION Left ~ 2012   face   CARDIAC CATHETERIZATION  02/2005   Dr. Patty Sermons; negative.   CARDIAC CATHETERIZATION N/A 05/13/2016   Procedure: Left Heart Cath and Coronary Angiography;  Surgeon: Jake Bathe, MD;  Location: [facility] INVASIVE CV LAB;  Service: Cardiovascular;  Laterality: N/A;   CARPAL TUNNEL RELEASE Left 10/2009   CARPAL TUNNEL RELEASE Right ~ 2013   CATARACT EXTRACTION W/ INTRAOCULAR LENS IMPLANT Left 2000's   CATARACT EXTRACTION W/ INTRAOCULAR LENS IMPLANT Right 2015   COLONOSCOPY W/ POLYPECTOMY     CORONARY ANGIOPLASTY WITH STENT PLACEMENT  06/26/2014   "1"   CORONARY ANGIOPLASTY WITH STENT PLACEMENT  06/26/2014   pLAD 50%, d LAD 60%, oD1 80%,  mD1  70%, CFX 50%, OM 2 70%,  RCA 80%, PDA 95% (<23mm), OM1 99%-0% with Bio flow stent        CORONARY ANGIOPLASTY WITH STENT PLACEMENT  03/09/2018   CORONARY STENT INTERVENTION N/A 03/09/2018   Procedure: CORONARY STENT  INTERVENTION;  Surgeon: Kathleene Hazel, MD;  Location: [facility] INVASIVE CV LAB;  Service: Cardiovascular;  Laterality: N/A;   ELBOW SURGERY Bilateral ~ 2014   "for blockages; Dr. Amanda Pea"   ENDARTERECTOMY Left 04/23/2015   Procedure: ENDARTERECTOMY CAROTID;  Surgeon: Fransisco Hertz, MD;  Location: [facility] OR;  Service: Vascular;  Laterality: Left;   EYE SURGERY Left    "related go diabetes; laser"   KNEE ARTHROSCOPY Left 05/2011   LEFT HEART CATH AND CORONARY ANGIOGRAPHY N/A 03/09/2018   Procedure: LEFT HEART CATH AND CORONARY ANGIOGRAPHY;  Surgeon: Kathleene Hazel, MD;  Location: [facility] INVASIVE CV LAB;  Service: Cardiovascular;  Laterality: N/A;   LEFT HEART CATHETERIZATION WITH CORONARY ANGIOGRAM N/A 06/26/2014   Procedure: LEFT HEART CATHETERIZATION WITH CORONARY ANGIOGRAM;  Surgeon: Micheline Chapman, MD;  Location: [facility] CATH LAB;  Service: Cardiovascular;  Laterality: N/A;   LEFT HEART CATHETERIZATION WITH CORONARY ANGIOGRAM  N/A 08/07/2014   Procedure: LEFT HEART CATHETERIZATION WITH CORONARY ANGIOGRAM;  Surgeon: Micheline Chapman, MD;  Location: [facility] CATH LAB;  Service: Cardiovascular;  Laterality: N/A;   LUMBAR LAMINECTOMY/DECOMPRESSION MICRODISCECTOMY Left 11/02/2017   Procedure: MICRODISCECTOMY LUMBAR THREE- LUMBAR FOUR, LEFT;  Surgeon: Coletta Memos, MD;  Location: [facility] OR;  Service: Neurosurgery;  Laterality: Left;   NASAL SEPTUM SURGERY  1979   PATCH ANGIOPLASTY Left 04/23/2015   Procedure: PATCH ANGIOPLASTY USING 1CM X 6CM XENOSURE BIOLOGIC PATCH;  Surgeon: Fransisco Hertz, MD;  Location: [facility] OR;  Service: Vascular;  Laterality: Left;   POSTERIOR LUMBAR FUSION  1964   SHOULDER OPEN ROTATOR CUFF REPAIR  10/14/2011   Procedure: ROTATOR CUFF REPAIR SHOULDER OPEN;  Surgeon: Javier Docker, MD;  Location: [facility] ORS;  Service: Orthopedics;  Laterality: Right;   SUBACROMIAL DECOMPRESSION  10/14/2011   Procedure: SUBACROMIAL DECOMPRESSION;  Surgeon: Javier Docker, MD;  Location: [facility] ORS;  Service: Orthopedics;  Laterality: Right;   TRIGGER FINGER RELEASE Right ~ 2013-2014   "3rd & 4th digits"   Social History   Socioeconomic History   Marital status: Married    Spouse name: Not on file   Number of children: 5   Years of education: College   Highest education level: Not on file  Occupational History   Not on file  Tobacco Use   Smoking status: Former    Current packs/day: 0.00    Average packs/day: 0.5 packs/day for 20.0 years (10.0 ttl pk-yrs)    Types: Cigarettes    Start date: 10/11/1968    Quit date: 10/11/1988    Years since quitting: 35.0   Smokeless tobacco: Never  Vaping Use   Vaping status: Never Used  Substance and Sexual Activity   Alcohol use: No    Alcohol/week: 0.0 standard drinks of alcohol    Comment: Quit drinking in 1990   Drug use: No   Sexual activity: Not Currently  Other Topics Concern   Not on file  Social History Narrative   Drinks about 2 cups of coffee a day, occasional diet pepsi.     Social Drivers of Corporate investment banker Strain: Not on file  Food Insecurity: Not on file  Transportation Needs: Not on file  Physical Activity: Not on file  Stress: Not on file  Social Connections: Not on file   Family History  Problem Relation Age of Onset   Diabetes Mother    Varicose Veins Mother    Heart attack Father 67       "massive heart attack"  Cancer Sister    Diabetes Sister    Hypertension Sister    Varicose Veins Sister    Diabetes Brother    Hypertension Brother    Allergies  Allergen Reactions   Dulaglutide     Other reaction(s): localized site reaction, dyspepsia   Insulin Degludec     Other reaction(s): Itching and swelling in hands   Gabapentin Other (See Comments)    Ataxia   Morphine And Codeine Itching        Naproxen Other (See Comments)    Neurological reaction. Tremors, hallucinates    Current Outpatient Medications  Medication Sig Dispense Refill   acetaminophen (TYLENOL) 325 MG tablet Take 650 mg by mouth every 6 (six) hours as needed for moderate pain or headache.      aspirin 81 MG tablet Take 1 tablet (81 mg total) by mouth daily.     atorvastatin (LIPITOR) 20 MG tablet Take 20 mg by mouth daily.     augmented betamethasone dipropionate (DIPROLENE-AF) 0.05 % cream as needed for itching.     chlorhexidine (PERIDEX) 0.12 % solution SWISH WITH 1-2 OUNCE FOR 30 SECONDS THEN SPIT TWICE DAILY as needed for flare up  0   chlorpheniramine-HYDROcodone (TUSSIONEX) 10-8 MG/5ML SUER as needed for cough.     clopidogrel (PLAVIX) 75 MG tablet Take 1 tablet (75 mg total) by mouth daily. 90 tablet 3   colchicine 0.6 MG tablet Take 0.6 mg by mouth daily as needed (gout).      Cyanocobalamin (B-12) 5000 MCG CAPS Take 5,000 mcg by mouth daily.     dorzolamide-timolol (COSOPT) 22.3-6.8 MG/ML ophthalmic solution Place 1 drop into both eyes 2 (two) times daily.     empagliflozin (JARDIANCE) 25 MG TABS tablet Take 25 mg by mouth daily.     HUMALOG  KWIKPEN 100 UNIT/ML KwikPen INJECT 12 UNITS WITH BREAKFAST AND LUNCH, 24 UNITS WITH SUPPER. ADD CORRECTION ISF 20, TARGET 100. MAX DOSE 100 UNITS PER DAY for 30 days     Insulin Pen Needle 31G X 8 MM MISC      isosorbide mononitrate (IMDUR) 60 MG 24 hr tablet Take 1 tablet by mouth twice daily 180 tablet 3   LANTUS SOLOSTAR 100 UNIT/ML Solostar Pen SMARTSIG:40 Unit(s) SUB-Q Twice Daily     lidocaine (LIDODERM) 5 % Place 1 patch onto the skin daily. Remove & Discard patch within 12 hours or as directed by MD 30 patch 0   lisinopril (ZESTRIL) 5 MG tablet Take 1 tablet (5 mg total) by mouth daily. 90 tablet 2   LUMIGAN 0.01 % SOLN Place 1 drop into the left eye at bedtime.     LYRICA 100 MG capsule Take 50 mg by mouth 2 (two) times daily as needed (1 in the AM and 2 in the PM). For neuropathic pain  2   Melatonin 5 MG TABS Take 5 mg by mouth at bedtime.     metFORMIN (GLUCOPHAGE) 1000 MG tablet Take 1,000 mg by mouth 2 (two) times daily with a meal.     metoprolol tartrate (LOPRESSOR) 100 MG tablet Take 1 tablet (100 mg total) by mouth 2 (two) times daily. 180 tablet 3   Multiple Vitamin (MULTI VITAMIN) TABS daily.     nitroGLYCERIN (NITROSTAT) 0.4 MG SL tablet Place 1 tablet (0.4 mg total) under the tongue every 5 (five) minutes as needed for chest pain. 75 tablet 2   Omega-3 Fatty Acids (FISH OIL PO) Take 520 mg by mouth daily.  pantoprazole (PROTONIX) 40 MG tablet Take 1 tablet (40 mg total) by mouth 2 (two) times daily. 180 tablet 3   Probiotic Product (PROBIOTIC PO) Take 1 tablet by mouth daily.     pyridoxine (B-6) 100 MG tablet Take 100 mg by mouth daily.     triamcinolone cream (KENALOG) 0.1 % Apply 1 application topically daily as needed (lichen planus). With cetaphil     Current Facility-Administered Medications  Medication Dose Route Frequency Provider Last Rate Last Admin   Hyaluronan (ORTHOVISC) intra-articular injection 30 mg  30 mg Intra-articular Once        Hyaluronan  (ORTHOVISC) intra-articular injection 30 mg  30 mg Intra-articular Once        No results found.  Review of Systems:   A ROS was performed including pertinent positives and negatives as documented in the HPI.  Physical Exam :   Constitutional: NAD and appears stated age Neurological: Alert and oriented Psych: Appropriate affect and cooperative There were no vitals taken for this visit.   Comprehensive Musculoskeletal Exam:    Bilateral crepitus tricompartmental of both knees.  Range of motion is from 0 to 100 degrees with crepitus.  Tricompartmental pain.  Small effusion bilaterally  Imaging:   Xray (4 views right knee, 4 views left knee): There is evidence of chondrocalcinosis with moderate osteoarthritis of bilateral knees    I personally reviewed and interpreted the radiographs.   Assessment:   83 y.o. male with moderate osteoarthritis which I do believe is more significant for him today than his pseudogout.  At this time he is requesting additional bilateral ultrasound-guided injections which were performed into both knees today.  I did also discuss that he may be a candidate for right shoulder arthroscopic balloon placement.  He will consider this.  -Bilateral ultrasound-guided knee injections performed today after verbal consent obtained    Procedure Note  Patient: ANTHONYJAMES BARGAR             Date of Birth: 1941/07/04           MRN: [id number]             Visit Date: 10/05/2023  Procedures: Visit Diagnoses:  No diagnosis found.   Large Joint Inj: R knee on 10/05/2023 12:18 PM Indications: pain Details: 22 G 1.5 in needle, ultrasound-guided anterior approach  Arthrogram: No  Medications: 4 mL lidocaine 1 %; 80 mg triamcinolone acetonide 40 MG/ML Outcome: tolerated well, no immediate complications Procedure, treatment alternatives, risks and benefits explained, specific risks discussed. Consent was given by the patient. Immediately prior to procedure a time out  was called to verify the correct patient, procedure, equipment, support staff and site/side marked as required. Patient was prepped and draped in the usual sterile fashion.    Large Joint Inj: L knee on 10/05/2023 12:18 PM Indications: pain Details: 22 G 1.5 in needle, ultrasound-guided anterior approach  Arthrogram: No  Medications: 4 mL lidocaine 1 %; 80 mg triamcinolone acetonide 40 MG/ML Outcome: tolerated well, no immediate complications Procedure, treatment alternatives, risks and benefits explained, specific risks discussed. Consent was given by the patient. Immediately prior to procedure a time out was called to verify the correct patient, procedure, equipment, support staff and site/side marked as required. Patient was prepped and draped in the usual sterile fashion.           I personally saw and evaluated the patient, and participated in the management and treatment plan.  Huel Cote, MD Attending Physician, Orthopedic Surgery  This document was dictated using Conservation officer, historic buildings. A reasonable attempt at proof reading has been made to minimize errors.

## 2023-10-11 DIAGNOSIS — E119 Type 2 diabetes mellitus without complications: Secondary | ICD-10-CM | POA: Diagnosis not present

## 2023-10-12 ENCOUNTER — Ambulatory Visit (HOSPITAL_BASED_OUTPATIENT_CLINIC_OR_DEPARTMENT_OTHER): Payer: Medicare Other | Attending: Internal Medicine | Admitting: Physical Therapy

## 2023-10-12 DIAGNOSIS — M25562 Pain in left knee: Secondary | ICD-10-CM | POA: Diagnosis not present

## 2023-10-12 DIAGNOSIS — G8929 Other chronic pain: Secondary | ICD-10-CM | POA: Diagnosis not present

## 2023-10-12 DIAGNOSIS — R262 Difficulty in walking, not elsewhere classified: Secondary | ICD-10-CM | POA: Diagnosis not present

## 2023-10-12 DIAGNOSIS — M25561 Pain in right knee: Secondary | ICD-10-CM | POA: Insufficient documentation

## 2023-10-12 DIAGNOSIS — M5459 Other low back pain: Secondary | ICD-10-CM | POA: Diagnosis not present

## 2023-10-12 DIAGNOSIS — M6281 Muscle weakness (generalized): Secondary | ICD-10-CM | POA: Insufficient documentation

## 2023-10-12 NOTE — Therapy (Signed)
 OUTPATIENT PHYSICAL THERAPY TREATMENT      Patient Name: Seth Young MRN: [id number] DOB:03/31/1941, 83 y.o., male Today's Date: 10/13/2023  END OF SESSION:  PT End of Session - 10/12/23 1403     Visit Number 8    Number of Visits 12    Date for PT Re-Evaluation 10/19/23    Authorization Type [employer] MCR    PT Start Time 1319    PT Stop Time 1401    PT Time Calculation (min) 42 min    Activity Tolerance Patient tolerated treatment well    Behavior During Therapy WFL for tasks assessed/performed                  Past Medical History:  Diagnosis Date   Arthritis    "knees, toes, hands, probably in my back" (03/09/2018)   Ataxia    Basal cell carcinoma    left temporal area-no residual   Colon polyps 10-12-11   past hx.   Coronary artery disease    Exertional angina (HCC) 06/26/2014   Cath with DES to the OM, Bioflow protocol    GERD (gastroesophageal reflux disease)    H/O hiatal hernia    no problems   High cholesterol    History of echocardiogram    Echo 7/19:  Mild LVH, mild focal basal septal hypertrophy, EF 60-65, no RWMA, Gr 1 DD, trivial AI, MAC   Hypertension    Ischemic chest pain (HCC) 08/07/2014   Occasional tremors    Bilateral hands   Peripheral neuropathy    Pneumonia 1940's X 2; ~ 2017   "infant; walking pneumonia" (03/09/2018)   Pseudogout    "it moves around"   Sleep apnea    no cpap used   Type II diabetes mellitus (HCC) dx'd 1982   nsulin started ~ 2000   Vertigo    Past Surgical History:  Procedure Laterality Date   BACK SURGERY     BASAL CELL CARCINOMA EXCISION Left ~ 2012   face   CARDIAC CATHETERIZATION  02/2005   Dr. Patty Sermons; negative.   CARDIAC CATHETERIZATION N/A 05/13/2016   Procedure: Left Heart Cath and Coronary Angiography;  Surgeon: Jake Bathe, MD;  Location: [facility];  Service: Cardiovascular;  Laterality: N/A;   CARPAL TUNNEL RELEASE Left 10/2009   CARPAL TUNNEL RELEASE Right ~ 2013   CATARACT EXTRACTION  W/ INTRAOCULAR LENS IMPLANT Left 2000's   CATARACT EXTRACTION W/ INTRAOCULAR LENS IMPLANT Right 2015   COLONOSCOPY W/ POLYPECTOMY     CORONARY ANGIOPLASTY WITH STENT PLACEMENT  06/26/2014   "1"   CORONARY ANGIOPLASTY WITH STENT PLACEMENT  06/26/2014   pLAD 50%, d LAD 60%, oD1 80%,  mD1  70%, CFX 50%, OM 2 70%,  RCA 80%, PDA 95% (<93mm), OM1 99%-0% with Bio flow stent        CORONARY ANGIOPLASTY WITH STENT PLACEMENT  03/09/2018   CORONARY STENT INTERVENTION N/A 03/09/2018   Procedure: CORONARY STENT INTERVENTION;  Surgeon: Kathleene Hazel, MD;  Location: [facility];  Service: Cardiovascular;  Laterality: N/A;   ELBOW SURGERY Bilateral ~ 2014   "for blockages; Dr. Amanda Pea"   ENDARTERECTOMY Left 04/23/2015   Procedure: ENDARTERECTOMY CAROTID;  Surgeon: Fransisco Hertz, MD;  Location: [facility] OR;  Service: Vascular;  Laterality: Left;   EYE SURGERY Left    "related go diabetes; laser"   KNEE ARTHROSCOPY Left 05/2011   LEFT HEART CATH AND CORONARY ANGIOGRAPHY N/A 03/09/2018   Procedure: LEFT HEART  CATH AND CORONARY ANGIOGRAPHY;  Surgeon: Kathleene Hazel, MD;  Location: [facility];  Service: Cardiovascular;  Laterality: N/A;   LEFT HEART CATHETERIZATION WITH CORONARY ANGIOGRAM N/A 06/26/2014   Procedure: LEFT HEART CATHETERIZATION WITH CORONARY ANGIOGRAM;  Surgeon: Micheline Chapman, MD;  Location[facility] CATH LAB;  Service: Cardiovascular;  Laterality: N/A;   LEFT HEART CATHETERIZATION WITH CORONARY ANGIOGRAM N/A 08/07/2014   Procedure: LEFT HEART CATHETERIZATION WITH CORONARY ANGIOGRAM;  Surgeon: Micheline Chapman, MD;  Location: [facility] CATH LAB;  Service: Cardiovascular;  Laterality: N/A;   LUMBAR LAMINECTOMY/DECOMPRESSION MICRODISCECTOMY Left 11/02/2017   Procedure: MICRODISCECTOMY LUMBAR THREE- LUMBAR FOUR, LEFT;  Surgeon: Coletta Memos, MD;  Location: [facility] OR;  Service: Neurosurgery;  Laterality: Left;   NASAL SEPTUM SURGERY  1979   PATCH ANGIOPLASTY Left 04/23/2015   Procedure: PATCH  ANGIOPLASTY USING 1CM X 6CM XENOSURE BIOLOGIC PATCH;  Surgeon: Fransisco Hertz, MD;  Location: [facility] OR;  Service: Vascular;  Laterality: Left;   POSTERIOR LUMBAR FUSION  1964   SHOULDER OPEN ROTATOR CUFF REPAIR  10/14/2011   Procedure: ROTATOR CUFF REPAIR SHOULDER OPEN;  Surgeon: Javier Docker, MD;  Location: [facility] ORS;  Service: Orthopedics;  Laterality: Right;   SUBACROMIAL DECOMPRESSION  10/14/2011   Procedure: SUBACROMIAL DECOMPRESSION;  Surgeon: Javier Docker, MD;  Location: [facility] ORS;  Service: Orthopedics;  Laterality: Right;   TRIGGER FINGER RELEASE Right ~ 2013-2014   "3rd & 4th digits"   Patient Active Problem List   Diagnosis Date Noted   Coronary artery disease involving native coronary artery of native heart with angina pectoris (HCC) 03/07/2018   HNP (herniated nucleus pulposus), lumbar 11/02/2017   Carotid artery disease without cerebral infarction (HCC) 04/09/2015   Chest pain of uncertain etiology 06/21/2014   Diabetic neuropathy (HCC) 06/21/2014   Essential hypertension 06/21/2014   Hypercholesterolemia 06/21/2014   Rotator cuff tear 10/15/2011   IDDM (insulin dependent diabetes mellitus) 10/16/2008    PCP: Geoffry Paradise, MD   REFERRING PROVIDER: Geoffry Paradise, MD   REFERRING DIAG: G89.29 other chronic pain, M54.40 Lumbago with sciatica, unspecified, M25.562 (ICD-10-CM) - Pain in left knee M25.561 (ICD-10-CM) - Pain in right knee  THERAPY DIAG:  Chronic pain of both knees  Other low back pain  Muscle weakness (generalized)  Difficulty in walking, not elsewhere classified  Rationale for Evaluation and Treatment: Rehabilitation  ONSET DATE: PT order 05/11/2022 / Surgery March 2022   SUBJECTIVE:   SUBJECTIVE STATEMENT: Pt states he had to adjust to the knee injections last week.  His knees are feeling better overall since the injection.          PERTINENT HISTORY: -3 back surgeries:  L3-5 laminotomy and pt thinks he has some screws in lumbar March 2022,  Lumbar laminectomy/decompression/microdisctomy in 2019, Lumbar surgery in 1964   -OA, Bilat knee pain R > L, CAD, Peripheral neuropathy, Vertigo, Pseudogout, DM type II and diabetic retinopathy,    -Pt has Insulin monitor and tremors in bilat Ue's R > L   -PSHx:  R shoulder surgery in 2013 and was unable to repair rotator cuff, Coronary angioplasty with stent placement in 2015 and 2019, and L knee arthroscopy in 2012  PAIN:   Lumbar:  0/10 pain currently, 3/10 worst  Knee pain: R: 1-2/10, L: 0/10 current, 7/10 worst  PRECAUTIONS: Other: multiple back surgeries, peripheral neuropathy, R shoulder RCR   WEIGHT BEARING RESTRICTIONS: No  FALLS:  Has patient fallen in last 6 months? No  LIVING ENVIRONMENT: Lives with:  lives with their spouse Lives in: 1 story home Stairs: 3-4 steps with rail Has following equipment at home: Single point cane, Environmental consultant - 2 wheeled, and Crutches  OCCUPATION: Pt works from home.   PLOF: Independent; Pt began using SPC in early 2019.  Pt was able to perform daily mobility and daily activities with greater ease and less difficulty   PATIENT GOALS: to improve strength in legs and get rid of his crutch   OBJECTIVE:  Note: Objective measures were completed at Evaluation unless otherwise noted.  DIAGNOSTIC FINDINGS:  L knee x ray in 11/23: IMPRESSION: 1. Severe patellofemoral and mild-to-moderate medial and lateral compartment osteoarthritis. 2. Small joint effusion.  R knee x ray in 11/23: IMPRESSION: 1. Severe patellofemoral and mild to moderate medial and lateral compartment osteoarthritis. 2. Small joint effusion.  Lumbar: FINDINGS: Segmentation:  Standard.   Alignment:  Trace anterolisthesis L3 on L4.   Vertebrae: No fracture, evidence of discitis, or bone lesion. There is some degenerative endplate signal change at L3-4.   Conus medullaris and cauda equina: Conus extends to the T12 level. Conus and cauda equina appear normal.    Paraspinal and other soft tissues: Negative.   Disc levels:   T11-12 and T12-L1 are imaged in the sagittal plane only and negative.   L1-2: Minimal disc bulge without stenosis.   L2-3: Shallow disc bulge and mild ligamentum flavum thickening. Mild central canal and left foraminal narrowing appear unchanged. The right foramen is open.   L3-4: Status post left laminotomy. There has been progression of disease at this level. A new and very large central left paracentral disc protrusion extends into the left foramen. There is severe compression of the thecal sac and narrowing in the lateral recesses, worse on the left. Moderate bilateral foraminal narrowing is worse on the left.   L4-5: Status post left laminotomy as seen on the prior exam. There is a shallow broad-based disc bulge. The narrowing in the subarticular recesses is present which could impact either descending L5 root. Mild bilateral foraminal narrowing. No change.   L5-S1: A right subarticular recess protrusion impinging on the right S1 root appears unchanged. Foramina are open.   IMPRESSION: Since the prior MRI, the patient has developed a large central and left paracentral disc protrusion at L3-4 causing severe compression of the thecal sac and narrowing in the lateral recesses, worse on the left. Moderate bilateral foraminal narrowing at this level is worse on the left. Prior left laminotomy noted.   No change in a broad-based disc bulge at L4-5 causing narrowing in the subarticular recesses which could impact either descending L5 root. Left laminotomy defect is seen at this level.   No change in a right subarticular recess protrusion at L5-S1. The disc appears to impinge on the right S1 root.   TODAY'S TREATMENT:  Ther - Ex: Nustep L4 just LE's x 6 mins Sidestepping with bilat UE  support on rail x 1 lap without resistance and x 2 laps with RTB proximal to knees  LAQ with 3# 3x10 bilat Seated HS curls with GTB 3 x 10 reps bilat Seated HS stretch 2x30 sec bilat  Sit to stands 1x5, 2x5-6 reps from elevated table with UE support   Standing rows with GTB 3x10   Neuro Re-ed Actvities: Standing on airex with FA, NBOS x30 sec and x40-45 sec each with intermittent UE support and one occasion of min assist Standing on airex with manual perturbations 2x30 sec with intermittent UE support Marching with UE support 3x15   PATIENT EDUCATION:  Education details: gait, rationale of interventions, importance of performing HEP, dx, and POC. Person educated: Patient Education method: Explanation, verbal cues, demonstration Education comprehension: verbalized understanding, returned demonstration, verbal cues required  HOME EXERCISE PROGRAM: Pt already has a HEP from prior PT.  Access Code: [email or web address]   ASSESSMENT:  CLINICAL IMPRESSION: Pt performed exercises well and required minimal cuing for correct form and positioning.  Pt required intermittent UE support with static balance exercise on airex and constant UE support with marching on airex.  Pt did well with sit to stand transfers from the elevated table.  Pt gave good effort with all exercises.  He continues to be limited with community ambulation.  Pt wants to switch to a cane with gait instead of using the forearm crutch.  Pt tolerated Rx well and had no c/o's after Rx.      OBJECTIVE IMPAIRMENTS: Abnormal gait, decreased activity tolerance, decreased balance, decreased endurance, decreased mobility, difficulty walking, decreased ROM, decreased strength, hypomobility, impaired flexibility, and pain.   ACTIVITY LIMITATIONS: standing, squatting, stairs, transfers, and locomotion level  PARTICIPATION LIMITATIONS: meal prep, cleaning, shopping, and community activity  PERSONAL FACTORS: Time since onset of  injury/illness/exacerbation and 3+ comorbidities:  multiple back surgeries, OA, Peripheral neuropathy, Pseudogout, DM type II  are also affecting patient's functional outcome.   REHAB POTENTIAL: Good   CLINICAL DECISION MAKING: Evolving/moderate complexity   EVALUATION COMPLEXITY: Moderate   GOALS:   SHORT TERM GOALS: Target date: 07/14/2023  Pt will report improved compliance with HEP for improved strength, pain, mobility, and function.  Baseline: Goal status: GOAL MET  1/29  2.  Pt will report at least a 40% improvement in performance of and tolerance with functional mobility.  Baseline:  Goal status:  75% MET  3.  Pt will increase ambulation distance by at least 100 ft for improved tolerance with community ambulation.  Baseline:  Goal status: GOAL MET  1/29    LONG TERM GOALS: Target date: 10/19/2023  Pt will be able to turn with gait with improved ease and good stability.  Baseline:  Goal status: NOT MET  1/29  2.  Pt will report at least a 50% improvement in tolerance with standing.  Baseline:  Goal status:  PROGRESSING  1/29  3.  Pt will demo at least an 8-10# increase in bilat hip flex and abd strength, 5-10# increase in knee flexion, and 5/5 in knee extension bilat strength for improved performance of and tolerance with functional mobility.  Baseline:  Goal status: 50% MET   1/29  4.  Pt will be able to ambulate > 600 ft on 6 MWT for improved community ambulation.   Baseline:  Goal status: 95% MET   1/29  5.  Pt will ambulate at least 670 ft on the  6 MWT for improved ambulation distance.   Goal status:  INITIAL    PLAN:  PT FREQUENCY: 1x/wk   PT DURATION: 6 weeks  PLANNED INTERVENTIONS: Therapeutic exercises, Therapeutic activity, Neuromuscular re-education, Balance training, Gait training, Patient/Family education, Self Care, Joint mobilization, Stair training, Aquatic Therapy, Dry Needling, Electrical stimulation, Cryotherapy, Moist heat, Taping,  Manual therapy, and Re-evaluation.   PLAN FOR NEXT SESSION:  LE strengthening, gait, balance, and improve standing tolerance.    Audie Clear III PT, DPT 10/13/23 11:28 PM

## 2023-10-13 ENCOUNTER — Encounter (HOSPITAL_BASED_OUTPATIENT_CLINIC_OR_DEPARTMENT_OTHER): Payer: Self-pay | Admitting: Physical Therapy

## 2023-10-19 ENCOUNTER — Encounter (HOSPITAL_BASED_OUTPATIENT_CLINIC_OR_DEPARTMENT_OTHER): Payer: Self-pay | Admitting: Physical Therapy

## 2023-10-19 ENCOUNTER — Ambulatory Visit (HOSPITAL_BASED_OUTPATIENT_CLINIC_OR_DEPARTMENT_OTHER): Payer: Medicare Other | Admitting: Physical Therapy

## 2023-10-19 DIAGNOSIS — M25562 Pain in left knee: Secondary | ICD-10-CM | POA: Diagnosis not present

## 2023-10-19 DIAGNOSIS — M5459 Other low back pain: Secondary | ICD-10-CM

## 2023-10-19 DIAGNOSIS — M6281 Muscle weakness (generalized): Secondary | ICD-10-CM

## 2023-10-19 DIAGNOSIS — R262 Difficulty in walking, not elsewhere classified: Secondary | ICD-10-CM | POA: Diagnosis not present

## 2023-10-19 DIAGNOSIS — G8929 Other chronic pain: Secondary | ICD-10-CM

## 2023-10-19 DIAGNOSIS — M25561 Pain in right knee: Secondary | ICD-10-CM | POA: Diagnosis not present

## 2023-10-19 NOTE — Therapy (Signed)
 OUTPATIENT PHYSICAL THERAPY TREATMENT      Patient Name: Seth Young MRN: [id number] DOB:April 25, 1941, 83 y.o., male Today's Date: 10/20/2023  END OF SESSION:  PT End of Session - 10/19/23 1326     Visit Number 9    Number of Visits 12    Date for PT Re-Evaluation 10/19/23    Authorization Type [employer] MCR    PT Start Time 1323    PT Stop Time 1401    PT Time Calculation (min) 38 min    Activity Tolerance Patient tolerated treatment well    Behavior During Therapy WFL for tasks assessed/performed                  Past Medical History:  Diagnosis Date   Arthritis    "knees, toes, hands, probably in my back" (03/09/2018)   Ataxia    Basal cell carcinoma    left temporal area-no residual   Colon polyps 10-12-11   past hx.   Coronary artery disease    Exertional angina (HCC) 06/26/2014   Cath with DES to the OM, Bioflow protocol    GERD (gastroesophageal reflux disease)    H/O hiatal hernia    no problems   High cholesterol    History of echocardiogram    Echo 7/19:  Mild LVH, mild focal basal septal hypertrophy, EF 60-65, no RWMA, Gr 1 DD, trivial AI, MAC   Hypertension    Ischemic chest pain (HCC) 08/07/2014   Occasional tremors    Bilateral hands   Peripheral neuropathy    Pneumonia 1940's X 2; ~ 2017   "infant; walking pneumonia" (03/09/2018)   Pseudogout    "it moves around"   Sleep apnea    no cpap used   Type II diabetes mellitus (HCC) dx'd 1982   nsulin started ~ 2000   Vertigo    Past Surgical History:  Procedure Laterality Date   BACK SURGERY     BASAL CELL CARCINOMA EXCISION Left ~ 2012   face   CARDIAC CATHETERIZATION  02/2005   Dr. Patty Sermons; negative.   CARDIAC CATHETERIZATION N/A 05/13/2016   Procedure: Left Heart Cath and Coronary Angiography;  Surgeon: Jake Bathe, MD;  Location: [facility];  Service: Cardiovascular;  Laterality: N/A;   CARPAL TUNNEL RELEASE Left 10/2009   CARPAL TUNNEL RELEASE Right ~ 2013   CATARACT EXTRACTION  W/ INTRAOCULAR LENS IMPLANT Left 2000's   CATARACT EXTRACTION W/ INTRAOCULAR LENS IMPLANT Right 2015   COLONOSCOPY W/ POLYPECTOMY     CORONARY ANGIOPLASTY WITH STENT PLACEMENT  06/26/2014   "1"   CORONARY ANGIOPLASTY WITH STENT PLACEMENT  06/26/2014   pLAD 50%, d LAD 60%, oD1 80%,  mD1  70%, CFX 50%, OM 2 70%,  RCA 80%, PDA 95% (<100mm), OM1 99%-0% with Bio flow stent        CORONARY ANGIOPLASTY WITH STENT PLACEMENT  03/09/2018   CORONARY STENT INTERVENTION N/A 03/09/2018   Procedure: CORONARY STENT INTERVENTION;  Surgeon: Kathleene Hazel, MD;  Location: [facility];  Service: Cardiovascular;  Laterality: N/A;   ELBOW SURGERY Bilateral ~ 2014   "for blockages; Dr. Amanda Pea"   ENDARTERECTOMY Left 04/23/2015   Procedure: ENDARTERECTOMY CAROTID;  Surgeon: Fransisco Hertz, MD;  Location: [facility] OR;  Service: Vascular;  Laterality: Left;   EYE SURGERY Left    "related go diabetes; laser"   KNEE ARTHROSCOPY Left 05/2011   LEFT HEART CATH AND CORONARY ANGIOGRAPHY N/A 03/09/2018   Procedure: LEFT HEART  CATH AND CORONARY ANGIOGRAPHY;  Surgeon: Kathleene Hazel, MD;  Location: [facility];  Service: Cardiovascular;  Laterality: N/A;   LEFT HEART CATHETERIZATION WITH CORONARY ANGIOGRAM N/A 06/26/2014   Procedure: LEFT HEART CATHETERIZATION WITH CORONARY ANGIOGRAM;  Surgeon: Micheline Chapman, MD;  Location: [facility] C[facility];  Service: Cardiovascular;  Laterality: N/A;   LEFT HEART CATHETERIZATION WITH CORONARY ANGIOGRAM N/A 08/07/2014   Procedure: LEFT HEART CATHETERIZATION WITH CORONARY ANGIOGRAM;  Surgeon: Micheline Chapman, MD;  Location: [facility] CATH LAB;  Service: Cardiovascular;  Laterality: N/A;   LUMBAR LAMINECTOMY/DECOMPRESSION MICRODISCECTOMY Left 11/02/2017   Procedure: MICRODISCECTOMY LUMBAR THREE- LUMBAR FOUR, LEFT;  Surgeon: Coletta Memos, MD;  Location: [facility] OR;  Service: Neurosurgery;  Laterality: Left;   NASAL SEPTUM SURGERY  1979   PATCH ANGIOPLASTY Left 04/23/2015   Procedure: PATCH  ANGIOPLASTY USING 1CM X 6CM XENOSURE BIOLOGIC PATCH;  Surgeon: Fransisco Hertz, MD;  Location: [facility] OR;  Service: Vascular;  Laterality: Left;   POSTERIOR LUMBAR FUSION  1964   SHOULDER OPEN ROTATOR CUFF REPAIR  10/14/2011   Procedure: ROTATOR CUFF REPAIR SHOULDER OPEN;  Surgeon: Javier Docker, MD;  Location: [facility];  Service: Orthopedics;  Laterality: Right;   SUBACROMIAL DECOMPRESSION  10/14/2011   Procedure: SUBACROMIAL DECOMPRESSION;  Surgeon: Javier Docker, MD;  Location: [facility];  Service: Orthopedics;  Laterality: Right;   TRIGGER FINGER RELEASE Right ~ 2013-2014   "3rd & 4th digits"   Patient Active Problem List   Diagnosis Date Noted   Coronary artery disease involving native coronary artery of native heart with angina pectoris (HCC) 03/07/2018   HNP (herniated nucleus pulposus), lumbar 11/02/2017   Carotid artery disease without cerebral infarction (HCC) 04/09/2015   Chest pain of uncertain etiology 06/21/2014   Diabetic neuropathy (HCC) 06/21/2014   Essential hypertension 06/21/2014   Hypercholesterolemia 06/21/2014   Rotator cuff tear 10/15/2011   IDDM (insulin dependent diabetes mellitus) 10/16/2008    PCP: Geoffry Paradise, MD   REFERRING PROVIDER: Geoffry Paradise, MD   REFERRING DIAG: G89.29 other chronic pain, M54.40 Lumbago with sciatica, unspecified, M25.562 (ICD-10-CM) - Pain in left knee M25.561 (ICD-10-CM) - Pain in right knee  THERAPY DIAG:  Chronic pain of both knees  Other low back pain  Muscle weakness (generalized)  Difficulty in walking, not elsewhere classified  Rationale for Evaluation and Treatment: Rehabilitation  ONSET DATE: PT order 05/11/2022 / Surgery March 2022   SUBJECTIVE:   SUBJECTIVE STATEMENT: Pt states his knees still crack and pop.  He is not sure if his knees are still doing better since the injection.  Pt denies any adverse effects after prior Rx.  Pt states his improvements comes and goes.  "I like the physical therapy."  Pt reports  he has performed his HEP a little.  Pt has weakness in back halfway through his shower.  Pt reports 50-60% improvement in tolerance with standing.         PERTINENT HISTORY: -3 back surgeries:  L3-5 laminotomy and pt thinks he has some screws in lumbar March 2022, Lumbar laminectomy/decompression/microdisctomy in 2019, Lumbar surgery in 1964   -OA, Bilat knee pain R > L, CAD, Peripheral neuropathy, Vertigo, Pseudogout, DM type II and diabetic retinopathy,    -Pt has Insulin monitor and tremors in bilat Ue's R > L   -PSHx:  R shoulder surgery in 2013 and was unable to repair rotator cuff, Coronary angioplasty with stent placement in 2015 and 2019, and L knee arthroscopy in 2012  PAIN:  Lumbar:  0/10 pain currently, 3/10 worst  Knee pain: R: 4/10 bilat  PRECAUTIONS: Other: multiple back surgeries, peripheral neuropathy, R shoulder RCR   WEIGHT BEARING RESTRICTIONS: No  FALLS:  Has patient fallen in last 6 months? No  LIVING ENVIRONMENT: Lives with: lives with their spouse Lives in: 1 story home Stairs: 3-4 steps with rail Has following equipment at home: Single point cane, Environmental consultant - 2 wheeled, and Crutches  OCCUPATION: Pt works from home.   PLOF: Independent; Pt began using SPC in early 2019.  Pt was able to perform daily mobility and daily activities with greater ease and less difficulty   PATIENT GOALS: to improve strength in legs and get rid of his crutch   OBJECTIVE:  Note: Objective measures were completed at Evaluation unless otherwise noted.  DIAGNOSTIC FINDINGS:  L knee x ray in 11/23: IMPRESSION: 1. Severe patellofemoral and mild-to-moderate medial and lateral compartment osteoarthritis. 2. Small joint effusion.  R knee x ray in 11/23: IMPRESSION: 1. Severe patellofemoral and mild to moderate medial and lateral compartment osteoarthritis. 2. Small joint effusion.  Lumbar: FINDINGS: Segmentation:  Standard.   Alignment:  Trace anterolisthesis L3 on  L4.   Vertebrae: No fracture, evidence of discitis, or bone lesion. There is some degenerative endplate signal change at L3-4.   Conus medullaris and cauda equina: Conus extends to the T12 level. Conus and cauda equina appear normal.   Paraspinal and other soft tissues: Negative.   Disc levels:   T11-12 and T12-L1 are imaged in the sagittal plane only and negative.   L1-2: Minimal disc bulge without stenosis.   L2-3: Shallow disc bulge and mild ligamentum flavum thickening. Mild central canal and left foraminal narrowing appear unchanged. The right foramen is open.   L3-4: Status post left laminotomy. There has been progression of disease at this level. A new and very large central left paracentral disc protrusion extends into the left foramen. There is severe compression of the thecal sac and narrowing in the lateral recesses, worse on the left. Moderate bilateral foraminal narrowing is worse on the left.   L4-5: Status post left laminotomy as seen on the prior exam. There is a shallow broad-based disc bulge. The narrowing in the subarticular recesses is present which could impact either descending L5 root. Mild bilateral foraminal narrowing. No change.   L5-S1: A right subarticular recess protrusion impinging on the right S1 root appears unchanged. Foramina are open.   IMPRESSION: Since the prior MRI, the patient has developed a large central and left paracentral disc protrusion at L3-4 causing severe compression of the thecal sac and narrowing in the lateral recesses, worse on the left. Moderate bilateral foraminal narrowing at this level is worse on the left. Prior left laminotomy noted.   No change in a broad-based disc bulge at L4-5 causing narrowing in the subarticular recesses which could impact either descending L5 root. Left laminotomy defect is seen at this level.   No change in a right subarticular recess protrusion at L5-S1. The disc appears to impinge on  the right S1 root.   TODAY'S TREATMENT:  Ther - Ex: Nustep L5 x  5 mins bilat UE/LE Sidestepping with bilat UE support on rail x 2 laps with RTB proximal to knees  LAQ with 4# 3x10 bilat Seated HS curls with GTB 3 x 10 reps bilat Seated HS stretch 2x30 sec bilat  Therapeutic Activities: Sit to stands 3x7 reps from elevated table with UE support   Pt ambulated 1 lap (300 ft) with lofstrand crutch in 2 min 40 sec.   Neuro Re-ed Actvities: Marching with UE support 2x15 Step ups on airex x 10 each LE with UE support on rail Standing rows with GTB 3x10   PATIENT EDUCATION:  Education details:  rationale of performing HEP.  gait, rationale of interventions, importance of performing HEP, dx, and POC. Person educated: Patient Education method: Explanation, verbal cues, demonstration Education comprehension: verbalized understanding, returned demonstration, verbal cues required  HOME EXERCISE PROGRAM: Pt already has a HEP from prior PT.  Access Code: [email or web address]   ASSESSMENT:  CLINICAL IMPRESSION: Pt reports he has been performing HEP a little bit.  PT instructed pt to be compliant with HEP and educated on the rationale of HEP.  Pt continues to be limited with standing duration though reports progress.  He reports having weakness in back halfway through his shower.  Pt reports 50-60% improvement in tolerance with standing.  Pt is fatigued with ambulation but had improved quality of gait including improved posture when ambulating 1 lap today.  Pt performed exercises well.  PT increased reps with sit to stands.  PT has to perform sit to stands with table elevated.  Pt met LTG #2.  PT will assess goals next visit.     OBJECTIVE IMPAIRMENTS: Abnormal gait, decreased activity tolerance, decreased balance, decreased endurance, decreased mobility, difficulty walking,  decreased ROM, decreased strength, hypomobility, impaired flexibility, and pain.   ACTIVITY LIMITATIONS: standing, squatting, stairs, transfers, and locomotion level  PARTICIPATION LIMITATIONS: meal prep, cleaning, shopping, and community activity  PERSONAL FACTORS: Time since onset of injury/illness/exacerbation and 3+ comorbidities:  multiple back surgeries, OA, Peripheral neuropathy, Pseudogout, DM type II  are also affecting patient's functional outcome.   REHAB POTENTIAL: Good   CLINICAL DECISION MAKING: Evolving/moderate complexity   EVALUATION COMPLEXITY: Moderate   GOALS:   SHORT TERM GOALS: Target date: 07/14/2023  Pt will report improved compliance with HEP for improved strength, pain, mobility, and function.  Baseline: Goal status: GOAL MET  1/29  2.  Pt will report at least a 40% improvement in performance of and tolerance with functional mobility.  Baseline:  Goal status:  75% MET  3.  Pt will increase ambulation distance by at least 100 ft for improved tolerance with community ambulation.  Baseline:  Goal status: GOAL MET  1/29    LONG TERM GOALS: Target date: 10/19/2023  Pt will be able to turn with gait with improved ease and good stability.  Baseline:  Goal status: NOT MET  1/29  2.  Pt will report at least a 50% improvement in tolerance with standing.  Baseline:  Goal status:  GOAL MET  3/12  3.  Pt will demo at least an 8-10# increase in bilat hip flex and abd strength, 5-10# increase in knee flexion, and 5/5 in knee extension bilat strength for improved performance of and tolerance with functional mobility.  Baseline:  Goal status: 50% MET   1/29  4.  Pt will be able to ambulate > 600 ft on 6 MWT for improved community ambulation.   Baseline:  Goal  status: 95% MET   1/29  5.  Pt will ambulate at least 670 ft on the 6 MWT for improved ambulation distance.   Goal status:  INITIAL    PLAN:  PT FREQUENCY: 1x/wk   PT DURATION: 6  weeks  PLANNED INTERVENTIONS: Therapeutic exercises, Therapeutic activity, Neuromuscular re-education, Balance training, Gait training, Patient/Family education, Self Care, Joint mobilization, Stair training, Aquatic Therapy, Dry Needling, Electrical stimulation, Cryotherapy, Moist heat, Taping, Manual therapy, and Re-evaluation.   PLAN FOR NEXT SESSION:  LE strengthening, gait, balance, and improve standing tolerance.  PN vs discharge next Rx.  Assess strength and 6 MWT.   Audie Clear III PT, DPT 10/20/23 3:15 PM

## 2023-10-24 DIAGNOSIS — H401123 Primary open-angle glaucoma, left eye, severe stage: Secondary | ICD-10-CM | POA: Diagnosis not present

## 2023-10-26 ENCOUNTER — Ambulatory Visit (HOSPITAL_BASED_OUTPATIENT_CLINIC_OR_DEPARTMENT_OTHER): Payer: Medicare Other | Admitting: Physical Therapy

## 2023-10-26 DIAGNOSIS — M5459 Other low back pain: Secondary | ICD-10-CM | POA: Diagnosis not present

## 2023-10-26 DIAGNOSIS — M6281 Muscle weakness (generalized): Secondary | ICD-10-CM

## 2023-10-26 DIAGNOSIS — G8929 Other chronic pain: Secondary | ICD-10-CM

## 2023-10-26 DIAGNOSIS — R262 Difficulty in walking, not elsewhere classified: Secondary | ICD-10-CM | POA: Diagnosis not present

## 2023-10-26 DIAGNOSIS — M25562 Pain in left knee: Secondary | ICD-10-CM | POA: Diagnosis not present

## 2023-10-26 DIAGNOSIS — M25561 Pain in right knee: Secondary | ICD-10-CM | POA: Diagnosis not present

## 2023-10-26 NOTE — Therapy (Signed)
 OUTPATIENT PHYSICAL THERAPY TREATMENT      Patient Name: Seth Young MRN: [id number] DOB:Jun 01, 1941, 83 y.o., male Today's Date: 10/27/2023  END OF SESSION:  PT End of Session - 10/26/23 1413     Visit Number 10    Number of Visits 14    Date for PT Re-Evaluation 11/23/23    Authorization Type [employer] MCR    PT Start Time 1320    PT Stop Time 1410    PT Time Calculation (min) 50 min    Activity Tolerance Patient tolerated treatment well    Behavior During Therapy WFL for tasks assessed/performed                   Past Medical History:  Diagnosis Date   Arthritis    "knees, toes, hands, probably in my back" (03/09/2018)   Ataxia    Basal cell carcinoma    left temporal area-no residual   Colon polyps 10-12-11   past hx.   Coronary artery disease    Exertional angina (HCC) 06/26/2014   Cath with DES to the OM, Bioflow protocol    GERD (gastroesophageal reflux disease)    H/O hiatal hernia    no problems   High cholesterol    History of echocardiogram    Echo 7/19:  Mild LVH, mild focal basal septal hypertrophy, EF 60-65, no RWMA, Gr 1 DD, trivial AI, MAC   Hypertension    Ischemic chest pain (HCC) 08/07/2014   Occasional tremors    Bilateral hands   Peripheral neuropathy    Pneumonia 1940's X 2; ~ 2017   "infant; walking pneumonia" (03/09/2018)   Pseudogout    "it moves around"   Sleep apnea    no cpap used   Type II diabetes mellitus (HCC) dx'd 1982   nsulin started ~ 2000   Vertigo    Past Surgical History:  Procedure Laterality Date   BACK SURGERY     BASAL CELL CARCINOMA EXCISION Left ~ 2012   face   CARDIAC CATHETERIZATION  02/2005   Dr. Patty Sermons; negative.   CARDIAC CATHETERIZATION N/A 05/13/2016   Procedure: Left Heart Cath and Coronary Angiography;  Surgeon: Jake Bathe, MD;  Location: [facility];  Service: Cardiovascular;  Laterality: N/A;   CARPAL TUNNEL RELEASE Left 10/2009   CARPAL TUNNEL RELEASE Right ~ 2013   CATARACT  EXTRACTION W/ INTRAOCULAR LENS IMPLANT Left 2000's   CATARACT EXTRACTION W/ INTRAOCULAR LENS IMPLANT Right 2015   COLONOSCOPY W/ POLYPECTOMY     CORONARY ANGIOPLASTY WITH STENT PLACEMENT  06/26/2014   "1"   CORONARY ANGIOPLASTY WITH STENT PLACEMENT  06/26/2014   pLAD 50%, d LAD 60%, oD1 80%,  mD1  70%, CFX 50%, OM 2 70%,  RCA 80%, PDA 95% (<21mm), OM1 99%-0% with Bio flow stent        CORONARY ANGIOPLASTY WITH STENT PLACEMENT  03/09/2018   CORONARY STENT INTERVENTION N/A 03/09/2018   Procedure: CORONARY STENT INTERVENTION;  Surgeon: Kathleene Hazel, MD;  Location: [facility];  Service: Cardiovascular;  Laterality: N/A;   ELBOW SURGERY Bilateral ~ 2014   "for blockages; Dr. Amanda Pea"   ENDARTERECTOMY Left 04/23/2015   Procedure: ENDARTERECTOMY CAROTID;  Surgeon: Fransisco Hertz, MD;  Location: [facility];  Service: Vascular;  Laterality: Left;   EYE SURGERY Left    "related go diabetes; laser"   KNEE ARTHROSCOPY Left 05/2011   LEFT HEART CATH AND CORONARY ANGIOGRAPHY N/A 03/09/2018   Procedure: LEFT  HEART CATH AND CORONARY ANGIOGRAPHY;  Surgeon: Kathleene Hazel, MD;  Location: [facility];  Service: Cardiovascular;  Laterality: N/A;   LEFT HEART CATHETERIZATION WITH CORONARY ANGIOGRAM N/A 06/26/2014   Procedure: LEFT HEART CATHETERIZATION WITH CORONARY ANGIOGRAM;  Surgeon: Micheline Chapman, MD;  Location: [facility] CATH LAB;  Service: Cardiovascular;  Laterality: N/A;   LEFT HEART CATHETERIZATION WITH CORONARY ANGIOGRAM N/A 08/07/2014   Procedure: LEFT HEART CATHETERIZATION WITH CORONARY ANGIOGRAM;  Surgeon: Micheline Chapman, MD;  Location: [facility] CATH LAB;  Service: Cardiovascular;  Laterality: N/A;   LUMBAR LAMINECTOMY/DECOMPRESSION MICRODISCECTOMY Left 11/02/2017   Procedure: MICRODISCECTOMY LUMBAR THREE- LUMBAR FOUR, LEFT;  Surgeon: Coletta Memos, MD;  Location: [facility] OR;  Service: Neurosurgery;  Laterality: Left;   NASAL SEPTUM SURGERY  1979   PATCH ANGIOPLASTY Left 04/23/2015    Procedure: PATCH ANGIOPLASTY USING 1CM X 6CM XENOSURE BIOLOGIC PATCH;  Surgeon: Fransisco Hertz, MD;  Location: [facility] OR;  Service: Vascular;  Laterality: Left;   POSTERIOR LUMBAR FUSION  1964   SHOULDER OPEN ROTATOR CUFF REPAIR  10/14/2011   Procedure: ROTATOR CUFF REPAIR SHOULDER OPEN;  Surgeon: Javier Docker, MD;  Location: [facility];  Service: Orthopedics;  Laterality: Right;   SUBACROMIAL DECOMPRESSION  10/14/2011   Procedure: SUBACROMIAL DECOMPRESSION;  Surgeon: Javier Docker, MD;  Location: [facility];  Service: Orthopedics;  Laterality: Right;   TRIGGER FINGER RELEASE Right ~ 2013-2014   "3rd & 4th digits"   Patient Active Problem List   Diagnosis Date Noted   Coronary artery disease involving native coronary artery of native heart with angina pectoris (HCC) 03/07/2018   HNP (herniated nucleus pulposus), lumbar 11/02/2017   Carotid artery disease without cerebral infarction (HCC) 04/09/2015   Chest pain of uncertain etiology 06/21/2014   Diabetic neuropathy (HCC) 06/21/2014   Essential hypertension 06/21/2014   Hypercholesterolemia 06/21/2014   Rotator cuff tear 10/15/2011   IDDM (insulin dependent diabetes mellitus) 10/16/2008    PCP: Geoffry Paradise, MD   REFERRING PROVIDER: Geoffry Paradise, MD   REFERRING DIAG: G89.29 other chronic pain, M54.40 Lumbago with sciatica, unspecified, M25.562 (ICD-10-CM) - Pain in left knee M25.561 (ICD-10-CM) - Pain in right knee  THERAPY DIAG:  Chronic pain of both knees  Other low back pain  Muscle weakness (generalized)  Difficulty in walking, not elsewhere classified  Rationale for Evaluation and Treatment: Rehabilitation  ONSET DATE: PT order 05/11/2022 / Surgery March 2022   SUBJECTIVE:   SUBJECTIVE STATEMENT: Pt states he has weakness in his lumbar with bending activities including washing his legs in the shower or tying shoes.  Pt sits and stands in the shower.  Pt has weakness in back halfway through his shower.  Pt tried to walk  without cane though states he has a fear of falling.    Pt states his knees have been bothering him, though feeling better today.  Pt reports 40-50% / 40-45 % improvement in tolerance with standing and functional mobility respectively.  Pt states his ability/ease to turn with gait depends on the day.   Pt reports he has been performing some of his HEP.  Pt denies any adverse effects after prior Rx.        PERTINENT HISTORY: -3 back surgeries:  L3-5 laminotomy and pt thinks he has some screws in lumbar March 2022, Lumbar laminectomy/decompression/microdisctomy in 2019, Lumbar surgery in 1964   -OA, Bilat knee pain R > L, CAD, Peripheral neuropathy, Vertigo, Pseudogout, DM type II and diabetic retinopathy,    -Pt  has Insulin monitor and tremors in bilat Ue's R > L   -PSHx:  R shoulder surgery in 2013 and was unable to repair rotator cuff, Coronary angioplasty with stent placement in 2015 and 2019, and L knee arthroscopy in 2012  PAIN:   Lumbar:  0/10 pain currently, 3/10 worst  Knee pain: R: 4/10 bilat  PRECAUTIONS: Other: multiple back surgeries, peripheral neuropathy, R shoulder RCR   WEIGHT BEARING RESTRICTIONS: No  FALLS:  Has patient fallen in last 6 months? No  LIVING ENVIRONMENT: Lives with: lives with their spouse Lives in: 1 story home Stairs: 3-4 steps with rail Has following equipment at home: Single point cane, Environmental consultant - 2 wheeled, and Crutches  OCCUPATION: Pt works from home.   PLOF: Independent; Pt began using SPC in early 2019.  Pt was able to perform daily mobility and daily activities with greater ease and less difficulty   PATIENT GOALS: to improve strength in legs and get rid of his crutch   OBJECTIVE:  Note: Objective measures were completed at Evaluation unless otherwise noted.  DIAGNOSTIC FINDINGS:  L knee x ray in 11/23: IMPRESSION: 1. Severe patellofemoral and mild-to-moderate medial and lateral compartment osteoarthritis. 2. Small joint  effusion.  R knee x ray in 11/23: IMPRESSION: 1. Severe patellofemoral and mild to moderate medial and lateral compartment osteoarthritis. 2. Small joint effusion.  Lumbar: FINDINGS: Segmentation:  Standard.   Alignment:  Trace anterolisthesis L3 on L4.   Vertebrae: No fracture, evidence of discitis, or bone lesion. There is some degenerative endplate signal change at L3-4.   Conus medullaris and cauda equina: Conus extends to the T12 level. Conus and cauda equina appear normal.   Paraspinal and other soft tissues: Negative.   Disc levels:   T11-12 and T12-L1 are imaged in the sagittal plane only and negative.   L1-2: Minimal disc bulge without stenosis.   L2-3: Shallow disc bulge and mild ligamentum flavum thickening. Mild central canal and left foraminal narrowing appear unchanged. The right foramen is open.   L3-4: Status post left laminotomy. There has been progression of disease at this level. A new and very large central left paracentral disc protrusion extends into the left foramen. There is severe compression of the thecal sac and narrowing in the lateral recesses, worse on the left. Moderate bilateral foraminal narrowing is worse on the left.   L4-5: Status post left laminotomy as seen on the prior exam. There is a shallow broad-based disc bulge. The narrowing in the subarticular recesses is present which could impact either descending L5 root. Mild bilateral foraminal narrowing. No change.   L5-S1: A right subarticular recess protrusion impinging on the right S1 root appears unchanged. Foramina are open.   IMPRESSION: Since the prior MRI, the patient has developed a large central and left paracentral disc protrusion at L3-4 causing severe compression of the thecal sac and narrowing in the lateral recesses, worse on the left. Moderate bilateral foraminal narrowing at this level is worse on the left. Prior left laminotomy noted.   No change in a  broad-based disc bulge at L4-5 causing narrowing in the subarticular recesses which could impact either descending L5 root. Left laminotomy defect is seen at this level.   No change in a right subarticular recess protrusion at L5-S1. The disc appears to impinge on the right S1 root.   TODAY'S TREATMENT:  PATIENT SURVEYS:  FOTO:  Prior/Current:  47/41 with a goal of 58 at visit 14     LOWER EXTREMITY MMT:   MMT Right eval Left eval Right 1/29 Left 1/29 Right 3/19 Left 3/19  Hip flexion 32.1 27.4 40.6 34.8 41.7 40.3  Hip extension            Hip abduction 22.6 32.5 32.4 38.1 34.5 40.1  Hip adduction            Hip internal rotation            Hip external rotation            Knee flexion 20.4 seated 27.2 seated 29.1 31.0 33.6 39.5  Knee extension 4+/5; 37.4 4+/5; 40.8 4+/5; 41.2 5/5; 43.2 5/5; 44.4 38.6  Ankle dorsiflexion            Ankle plantarflexion            Ankle inversion            Ankle eversion             (Blank rows = not tested)     FUNCTIONAL TESTS:   6 MWT:   Initial:  Pt ambulated 220 ft with lofstrand crutch.  Pt stopped at 2 min 30 sec. Prior:  Pt ambulated 600 feet with lofstrand crutch with 2 seated rest breaks. Current:  Pt stopped 6 MWT at 5 mins 30 sec.  He didn't take a break during the test.  605 ft with lofstrand crutch      GAIT: Pt ambulated with and without lofstrand crutch.  He had no LOB and was steady with gait for short distance.  Pt has increased stooped posture ambulating without lofstrand crutch.  Pt ambulates with improved posture and increased speed with lofstrand crutch.  He has decreased TKE bilat.    Ther - Ex: Sidestepping with bilat UE support on rail x 3 laps with RTB proximal to knees  LAQ with 4# 3x10 bilat Seated HS stretch 2x30 sec bilat    PATIENT EDUCATION:  Education details:  PT  instructed pt to perform HEP.  Importance nad rationale of performing HEP.  Objective findings, gait, rationale of interventions, and POC. Person educated: Patient Education method: Explanation, verbal cues, demonstration Education comprehension: verbalized understanding, returned demonstration, verbal cues required  HOME EXERCISE PROGRAM: Pt already has a HEP from prior PT.  Access Code: [email or web address]   ASSESSMENT:  CLINICAL IMPRESSION: Pt has made progress functionally as evidenced by subjective reports.  He reports 40-50% / 40-45 % improvement in tolerance with standing and functional mobility respectively.  Pt has weakness in back and continues to be limited with standing tolerance and is limited with standing in shower.  He demonstrates improved standing tolerance with exercises.  Pt continues to have difficulty with turning with gait and states his ability/ease to turn with gait depends on the day. Pt demonstrates improved bilat hip flexion, hip abd, knee flex, and knee extension strength as evidenced by HHD testing.  Pt had no significant increase in 6 MWT ambulation distance though was able to walk longer before sitting down.  His FOTO score worsened from 47 to 41.  Pt has met all STG's and LTG's #4 and partially met LTG's #2,3.  Pt should benefit from cont skilled PT to address impairments and ongoing goals and to maximize functional mobility.    OBJECTIVE IMPAIRMENTS: Abnormal gait, decreased activity tolerance, decreased balance, decreased endurance, decreased mobility, difficulty walking, decreased ROM,  decreased strength, hypomobility, impaired flexibility, and pain.   ACTIVITY LIMITATIONS: standing, squatting, stairs, transfers, and locomotion level  PARTICIPATION LIMITATIONS: meal prep, cleaning, shopping, and community activity  PERSONAL FACTORS: Time since onset of injury/illness/exacerbation and 3+ comorbidities:  multiple back surgeries, OA, Peripheral neuropathy, Pseudogout, DM  type II  are also affecting patient's functional outcome.   REHAB POTENTIAL: Good   CLINICAL DECISION MAKING: Evolving/moderate complexity   EVALUATION COMPLEXITY: Moderate   GOALS:   SHORT TERM GOALS: Target date: 07/14/2023  Pt will report improved compliance with HEP for improved strength, pain, mobility, and function.  Baseline: Goal status: GOAL MET  1/29  2.  Pt will report at least a 40% improvement in performance of and tolerance with functional mobility.  Baseline:  Goal status:  Goal MET  3/19   3.  Pt will increase ambulation distance by at least 100 ft for improved tolerance with community ambulation.  Baseline:  Goal status: GOAL MET  1/29    LONG TERM GOALS: Target date:  12/14/2023   Pt will be able to turn with gait with improved ease and good stability.  Baseline:  Goal status: NOT MET  3/19  2.  Pt will report at least a 50% improvement in tolerance with standing.  Baseline:  Goal status:  90% MET  3/19  3.  Pt will demo at least an 8-10# increase in bilat hip flex and abd strength, 5-10# increase in knee flexion, and 5/5 in knee extension bilat strength for improved performance of and tolerance with functional mobility.  Baseline:  Goal status: 75% MET   3/19  4.  Pt will be able to ambulate > 600 ft on 6 MWT for improved community ambulation.   Baseline:  Goal status:  GOAL MET   3/19  5.  Pt will ambulate at least 670 ft on the 6 MWT for improved ambulation distance.   Goal status:  NOT MET    PLAN:  PT FREQUENCY: 1x/wk   PT DURATION: 4 weeks   (will put cert out for 7 weeks due to clinic schedule availability.)  PLANNED INTERVENTIONS: Therapeutic exercises, Therapeutic activity, Neuromuscular re-education, Balance training, Gait training, Patient/Family education, Self Care, Joint mobilization, Stair training, Aquatic Therapy, Dry Needling, Electrical stimulation, Cryotherapy, Moist heat, Taping, Manual therapy, and Re-evaluation.   PLAN  FOR NEXT SESSION:  LE strengthening, gait, balance, and improve standing tolerance.    Audie Clear III PT, DPT 10/27/23 1:05 PM

## 2023-10-27 ENCOUNTER — Encounter (HOSPITAL_BASED_OUTPATIENT_CLINIC_OR_DEPARTMENT_OTHER): Payer: Self-pay | Admitting: Physical Therapy

## 2023-11-02 ENCOUNTER — Ambulatory Visit (HOSPITAL_BASED_OUTPATIENT_CLINIC_OR_DEPARTMENT_OTHER): Payer: Medicare Other | Admitting: Physical Therapy

## 2023-11-02 ENCOUNTER — Encounter (HOSPITAL_BASED_OUTPATIENT_CLINIC_OR_DEPARTMENT_OTHER): Payer: Self-pay | Admitting: Physical Therapy

## 2023-11-02 DIAGNOSIS — G8929 Other chronic pain: Secondary | ICD-10-CM

## 2023-11-02 DIAGNOSIS — R262 Difficulty in walking, not elsewhere classified: Secondary | ICD-10-CM

## 2023-11-02 DIAGNOSIS — M5459 Other low back pain: Secondary | ICD-10-CM | POA: Diagnosis not present

## 2023-11-02 DIAGNOSIS — M25562 Pain in left knee: Secondary | ICD-10-CM | POA: Diagnosis not present

## 2023-11-02 DIAGNOSIS — M6281 Muscle weakness (generalized): Secondary | ICD-10-CM | POA: Diagnosis not present

## 2023-11-02 DIAGNOSIS — M25561 Pain in right knee: Secondary | ICD-10-CM | POA: Diagnosis not present

## 2023-11-02 NOTE — Therapy (Signed)
 OUTPATIENT PHYSICAL THERAPY TREATMENT      Patient Name: Seth Young MRN: [id number] DOB:[birth year], 83 y.o., male Today's Date: 11/03/2023  END OF SESSION:  PT End of Session - 11/02/23 1334     Visit Number 11    Number of Visits 14    Date for PT Re-Evaluation 12/14/23    Authorization Type [employer] MCR    PT Start Time 1316    PT Stop Time 1402    PT Time Calculation (min) 46 min    Activity Tolerance Patient tolerated treatment well    Behavior During Therapy WFL for tasks assessed/performed                    Past Medical History:  Diagnosis Date   Arthritis    "knees, toes, hands, probably in my back" (03/09/2018)   Ataxia    Basal cell carcinoma    left temporal area-no residual   Colon polyps 10-12-11   past hx.   Coronary artery disease    Exertional angina (HCC) 06/26/2014   Cath with DES to the OM, Bioflow protocol    GERD (gastroesophageal reflux disease)    H/O hiatal hernia    no problems   High cholesterol    History of echocardiogram    Echo 7/19:  Mild LVH, mild focal basal septal hypertrophy, EF 60-65, no RWMA, Gr 1 DD, trivial AI, MAC   Hypertension    Ischemic chest pain (HCC) 08/07/2014   Occasional tremors    Bilateral hands   Peripheral neuropathy    Pneumonia 1940's X 2; ~ 2017   "infant; walking pneumonia" (03/09/2018)   Pseudogout    "it moves around"   Sleep apnea    no cpap used   Type II diabetes mellitus (HCC) dx'd 1982   nsulin started ~ 2000   Vertigo    Past Surgical History:  Procedure Laterality Date   BACK SURGERY     BASAL CELL CARCINOMA EXCISION Left ~ 2012   face   CARDIAC CATHETERIZATION  02/2005   Dr. Patty Sermons; negative.   CARDIAC CATHETERIZATION N/A 05/13/2016   Procedure: Left Heart Cath and Coronary Angiography;  Surgeon: Jake Bathe, MD;  Location: [facility];  Service: Cardiovascular;  Laterality: N/A;   CARPAL TUNNEL RELEASE Left 10/2009   CARPAL TUNNEL RELEASE Right ~ 2013   CATARACT  EXTRACTION W/ INTRAOCULAR LENS IMPLANT Left 2000's   CATARACT EXTRACTION W/ INTRAOCULAR LENS IMPLANT Right 2015   COLONOSCOPY W/ POLYPECTOMY     CORONARY ANGIOPLASTY WITH STENT PLACEMENT  06/26/2014   "1"   CORONARY ANGIOPLASTY WITH STENT PLACEMENT  06/26/2014   pLAD 50%, d LAD 60%, oD1 80%,  mD1  70%, CFX 50%, OM 2 70%,  RCA 80%, PDA 95% (<20mm), OM1 99%-0% with Bio flow stent        CORONARY ANGIOPLASTY WITH STENT PLACEMENT  03/09/2018   CORONARY STENT INTERVENTION N/A 03/09/2018   Procedure: CORONARY STENT INTERVENTION;  Surgeon: Kathleene Hazel, MD;  Location: [facility];  Service: Cardiovascular;  Laterality: N/A;   ELBOW SURGERY Bilateral ~ 2014   "for blockages; Dr. Amanda Pea"   ENDARTERECTOMY Left 04/23/2015   Procedure: ENDARTERECTOMY CAROTID;  Surgeon: Fransisco Hertz, MD;  Location: [facility] OR;  Service: Vascular;  Laterality: Left;   EYE SURGERY Left    "related go diabetes; laser"   KNEE ARTHROSCOPY Left 05/2011   LEFT HEART CATH AND CORONARY ANGIOGRAPHY N/A 03/09/2018   Procedure:  LEFT HEART CATH AND CORONARY ANGIOGRAPHY;  Surgeon: Kathleene Hazel, MD;  Location: [facility];  Service: Cardiovascular;  Laterality: N/A;   LEFT HEART CATHETERIZATION WITH CORONARY ANGIOGRAM N/A 06/26/2014   Procedure: LEFT HEART CATHETERIZATION WITH CORONARY ANGIOGRAM;  Surgeon: Micheline Chapman, MD;  Location: [facility] CATH LAB;  Service: Cardiovascular;  Laterality: N/A;   LEFT HEART CATHETERIZATION WITH CORONARY ANGIOGRAM N/A 08/07/2014   Procedure: LEFT HEART CATHETERIZATION WITH CORONARY ANGIOGRAM;  Surgeon: Micheline Chapman, MD;  Location: [facility] CATH LAB;  Service: Cardiovascular;  Laterality: N/A;   LUMBAR LAMINECTOMY/DECOMPRESSION MICRODISCECTOMY Left 11/02/2017   Procedure: MICRODISCECTOMY LUMBAR THREE- LUMBAR FOUR, LEFT;  Surgeon: Coletta Memos, MD;  Location: [facility] OR;  Service: Neurosurgery;  Laterality: Left;   NASAL SEPTUM SURGERY  1979   PATCH ANGIOPLASTY Left 04/23/2015    Procedure: PATCH ANGIOPLASTY USING 1CM X 6CM XENOSURE BIOLOGIC PATCH;  Surgeon: Fransisco Hertz, MD;  Location: [facility] OR;  Service: Vascular;  Laterality: Left;   POSTERIOR LUMBAR FUSION  1964   SHOULDER OPEN ROTATOR CUFF REPAIR  10/14/2011   Procedure: ROTATOR CUFF REPAIR SHOULDER OPEN;  Surgeon: Javier Docker, MD;  Location: [facility];  Service: Orthopedics;  Laterality: Right;   SUBACROMIAL DECOMPRESSION  10/14/2011   Procedure: SUBACROMIAL DECOMPRESSION;  Surgeon: Javier Docker, MD;  Location: [facility];  Service: Orthopedics;  Laterality: Right;   TRIGGER FINGER RELEASE Right ~ 2013-2014   "3rd & 4th digits"   Patient Active Problem List   Diagnosis Date Noted   Coronary artery disease involving native coronary artery of native heart with angina pectoris (HCC) 03/07/2018   HNP (herniated nucleus pulposus), lumbar 11/02/2017   Carotid artery disease without cerebral infarction (HCC) 04/09/2015   Chest pain of uncertain etiology 06/21/2014   Diabetic neuropathy (HCC) 06/21/2014   Essential hypertension 06/21/2014   Hypercholesterolemia 06/21/2014   Rotator cuff tear 10/15/2011   IDDM (insulin dependent diabetes mellitus) 10/16/2008    PCP: Geoffry Paradise, MD   REFERRING PROVIDER: Geoffry Paradise, MD   REFERRING DIAG: G89.29 other chronic pain, M54.40 Lumbago with sciatica, unspecified, M25.562 (ICD-10-CM) - Pain in left knee M25.561 (ICD-10-CM) - Pain in right knee  THERAPY DIAG:  Chronic pain of both knees  Other low back pain  Muscle weakness (generalized)  Difficulty in walking, not elsewhere classified  Rationale for Evaluation and Treatment: Rehabilitation  ONSET DATE: PT order 05/11/2022 / Surgery March 2022   SUBJECTIVE:   SUBJECTIVE STATEMENT: Pt states he has been walking with his cane some and feels good using his cane.  He feels he uses his legs more when he uses his cane.  He used his stationary bike for about 10 mins though had L hip, glute, and groin pain.   Pt  states he has weakness in his lumbar with bending activities including washing his legs in the shower or tying shoes.  Pt sits and stands in the shower.  Pt has weakness in back halfway through his shower.  Pt denies any adverse effects after prior Rx.        PERTINENT HISTORY: -3 back surgeries:  L3-5 laminotomy and pt thinks he has some screws in lumbar March 2022, Lumbar laminectomy/decompression/microdisctomy in 2019, Lumbar surgery in 1964   -OA, Bilat knee pain R > L, CAD, Peripheral neuropathy, Vertigo, Pseudogout, DM type II and diabetic retinopathy,    -Pt has Insulin monitor and tremors in bilat Ue's R > L   -PSHx:  R shoulder surgery in 2013 and  was unable to repair rotator cuff, Coronary angioplasty with stent placement in 2015 and 2019, and L knee arthroscopy in 2012  PAIN:   Lumbar:  0/10 pain currently, 3/10 worst  Knee pain: 3/10 bilat  3/10 post hip pain on L  PRECAUTIONS: Other: multiple back surgeries, peripheral neuropathy, R shoulder RCR   WEIGHT BEARING RESTRICTIONS: No  FALLS:  Has patient fallen in last 6 months? No  LIVING ENVIRONMENT: Lives with: lives with their spouse Lives in: 1 story home Stairs: 3-4 steps with rail Has following equipment at home: Single point cane, Environmental consultant - 2 wheeled, and Crutches  OCCUPATION: Pt works from home.   PLOF: Independent; Pt began using SPC in early 2019.  Pt was able to perform daily mobility and daily activities with greater ease and less difficulty   PATIENT GOALS: to improve strength in legs and get rid of his crutch   OBJECTIVE:  Note: Objective measures were completed at Evaluation unless otherwise noted.  DIAGNOSTIC FINDINGS:  L knee x ray in 11/23: IMPRESSION: 1. Severe patellofemoral and mild-to-moderate medial and lateral compartment osteoarthritis. 2. Small joint effusion.  R knee x ray in 11/23: IMPRESSION: 1. Severe patellofemoral and mild to moderate medial and lateral compartment  osteoarthritis. 2. Small joint effusion.  Lumbar: FINDINGS: Segmentation:  Standard.   Alignment:  Trace anterolisthesis L3 on L4.   Vertebrae: No fracture, evidence of discitis, or bone lesion. There is some degenerative endplate signal change at L3-4.   Conus medullaris and cauda equina: Conus extends to the T12 level. Conus and cauda equina appear normal.   Paraspinal and other soft tissues: Negative.   Disc levels:   T11-12 and T12-L1 are imaged in the sagittal plane only and negative.   L1-2: Minimal disc bulge without stenosis.   L2-3: Shallow disc bulge and mild ligamentum flavum thickening. Mild central canal and left foraminal narrowing appear unchanged. The right foramen is open.   L3-4: Status post left laminotomy. There has been progression of disease at this level. A new and very large central left paracentral disc protrusion extends into the left foramen. There is severe compression of the thecal sac and narrowing in the lateral recesses, worse on the left. Moderate bilateral foraminal narrowing is worse on the left.   L4-5: Status post left laminotomy as seen on the prior exam. There is a shallow broad-based disc bulge. The narrowing in the subarticular recesses is present which could impact either descending L5 root. Mild bilateral foraminal narrowing. No change.   L5-S1: A right subarticular recess protrusion impinging on the right S1 root appears unchanged. Foramina are open.   IMPRESSION: Since the prior MRI, the patient has developed a large central and left paracentral disc protrusion at L3-4 causing severe compression of the thecal sac and narrowing in the lateral recesses, worse on the left. Moderate bilateral foraminal narrowing at this level is worse on the left. Prior left laminotomy noted.   No change in a broad-based disc bulge at L4-5 causing narrowing in the subarticular recesses which could impact either descending L5 root. Left  laminotomy defect is seen at this level.   No change in a right subarticular recess protrusion at L5-S1. The disc appears to impinge on the right S1 root.   TODAY'S TREATMENT:  Ther - Ex: Nustep L4 x  5 mins just LE's Sidestepping with bilat UE support on rail x 3 laps with RTB proximal to knees  LAQ with 5# 3x10 bilat Seated HS curls with GTB 3 x 10 reps bilat Seated HS stretch 2x30 sec bilat   Therapeutic Activities: Sit to stands 3x7 reps from elevated table with UE support    Pt ambulated 1 lap in the clinic with [facility] with cuing for posture.   Pt ambulated around hurdles (4) with SPC. Pt ambulated with SPC in hallway verbal commands for increasing/decreasing gait speed.  Step ups on airex x 10 each LE with bilat UE support on rail  PATIENT EDUCATION:  Education details:  PT instructed pt to perform HEP.  Importance and rationale of performing HEP.  gait, rationale of interventions, and POC. Person educated: Patient Education method: Explanation, verbal cues, demonstration Education comprehension: verbalized understanding, returned demonstration, verbal cues required  HOME EXERCISE PROGRAM: Pt already has a HEP from prior PT.  Access Code: [email or web address]   ASSESSMENT:  CLINICAL IMPRESSION: Pt has been using cane some, but enters the clinic with lofstrand crutch.  PT worked on ambulating with cane in the clinic.  He ambulated with cane well with good stability.  Pt is limited with ambulation and becomes fatigued with ambulation.  He has improved posture with gait though still requires some cuing.  PT had pt ambulate with changing speeds and pt is slow with gait even when increasing speeds.  Pt performed ther ex well.  He responded well to Rx having no c/o's after Rx.    OBJECTIVE IMPAIRMENTS: Abnormal gait, decreased activity tolerance, decreased balance,  decreased endurance, decreased mobility, difficulty walking, decreased ROM, decreased strength, hypomobility, impaired flexibility, and pain.   ACTIVITY LIMITATIONS: standing, squatting, stairs, transfers, and locomotion level  PARTICIPATION LIMITATIONS: meal prep, cleaning, shopping, and community activity  PERSONAL FACTORS: Time since onset of injury/illness/exacerbation and 3+ comorbidities:  multiple back surgeries, OA, Peripheral neuropathy, Pseudogout, DM type II  are also affecting patient's functional outcome.   REHAB POTENTIAL: Good   CLINICAL DECISION MAKING: Evolving/moderate complexity   EVALUATION COMPLEXITY: Moderate   GOALS:   SHORT TERM GOALS: Target date: 07/14/2023  Pt will report improved compliance with HEP for improved strength, pain, mobility, and function.  Baseline: Goal status: GOAL MET  1/29  2.  Pt will report at least a 40% improvement in performance of and tolerance with functional mobility.  Baseline:  Goal status:  Goal MET  3/19   3.  Pt will increase ambulation distance by at least 100 ft for improved tolerance with community ambulation.  Baseline:  Goal status: GOAL MET  1/29    LONG TERM GOALS: Target date:  12/14/2023   Pt will be able to turn with gait with improved ease and good stability.  Baseline:  Goal status: NOT MET  3/19  2.  Pt will report at least a 50% improvement in tolerance with standing.  Baseline:  Goal status:  90% MET  3/19  3.  Pt will demo at least an 8-10# increase in bilat hip flex and abd strength, 5-10# increase in knee flexion, and 5/5 in knee extension bilat strength for improved performance of and tolerance with functional mobility.  Baseline:  Goal status: 75% MET   3/19  4.  Pt will be able to ambulate > 600 ft on 6 MWT for improved community ambulation.   Baseline:  Goal status:  GOAL MET   3/19  5.  Pt will ambulate at least 670 ft on the 6 MWT for improved ambulation distance.   Goal status:  NOT  MET    PLAN:  PT FREQUENCY: 1x/wk   PT DURATION: 4 weeks   (will put cert out for 7 weeks due to clinic schedule availability.)  PLANNED INTERVENTIONS: Therapeutic exercises, Therapeutic activity, Neuromuscular re-education, Balance training, Gait training, Patient/Family education, Self Care, Joint mobilization, Stair training, Aquatic Therapy, Dry Needling, Electrical stimulation, Cryotherapy, Moist heat, Taping, Manual therapy, and Re-evaluation.   PLAN FOR NEXT SESSION:  LE strengthening, gait, balance, and improve standing tolerance.    Audie Clear III PT, DPT 11/03/23 7:45 PM

## 2023-11-09 ENCOUNTER — Encounter (HOSPITAL_BASED_OUTPATIENT_CLINIC_OR_DEPARTMENT_OTHER): Payer: Self-pay | Admitting: Physical Therapy

## 2023-11-16 ENCOUNTER — Ambulatory Visit (HOSPITAL_BASED_OUTPATIENT_CLINIC_OR_DEPARTMENT_OTHER): Admitting: Student

## 2023-11-16 ENCOUNTER — Encounter (HOSPITAL_BASED_OUTPATIENT_CLINIC_OR_DEPARTMENT_OTHER): Payer: Self-pay | Admitting: Student

## 2023-11-16 DIAGNOSIS — M170 Bilateral primary osteoarthritis of knee: Secondary | ICD-10-CM | POA: Diagnosis not present

## 2023-11-16 NOTE — Progress Notes (Signed)
 Chief Complaint: Bilateral knee pain     History of Present Illness:   11/16/2023: Patient presents today for follow-up pain in bilateral knees.  He does have known osteoarthritis which is most significant in the patellofemoral compartments as well as chondrocalcinosis of both knees.  He received bilateral cortisone injections in the knees on 10/05/2023 which gave him some relief, however this has already begun to wear off.  Previously trialed hyaluronic acid injections without relief.  Denies any recent falls or injuries.    Seth Young is a 83 y.o. male presents today with right worse than left knee pain which has been going on for several years.  He states that he did have an injection into the knees several years prior which did give him relief.  He has not had anything since then.  He has recently had a series of several back surgeries and as result is hoping to avoid any type of surgical intervention for the knees.  He is complaining of crepitus as well as pain going up and down stairs or with most activities of daily living.  He does enjoy traveling in a spare time although this has been limited as result of his knee pain.   Surgical History:   None  PMH/PSH/Family History/Social History/Meds/Allergies:    Past Medical History:  Diagnosis Date   Arthritis    "knees, toes, hands, probably in my back" (03/09/2018)   Ataxia    Basal cell carcinoma    left temporal area-no residual   Colon polyps 10-12-11   past hx.   Coronary artery disease    Exertional angina (HCC) 06/26/2014   Cath with DES to the OM, Bioflow protocol    GERD (gastroesophageal reflux disease)    H/O hiatal hernia    no problems   High cholesterol    History of echocardiogram    Echo 7/19:  Mild LVH, mild focal basal septal hypertrophy, EF 60-65, no RWMA, Gr 1 DD, trivial AI, MAC   Hypertension    Ischemic chest pain (HCC) 08/07/2014   Occasional tremors    Bilateral hands    Peripheral neuropathy    Pneumonia 1940's X 2; ~ 2017   "infant; walking pneumonia" (03/09/2018)   Pseudogout    "it moves around"   Sleep apnea    no cpap used   Type II diabetes mellitus (HCC) dx'd 1982   nsulin started ~ 2000   Vertigo    Past Surgical History:  Procedure Laterality Date   BACK SURGERY     BASAL CELL CARCINOMA EXCISION Left ~ 2012   face   CARDIAC CATHETERIZATION  02/2005   Dr. Patty Sermons; negative.   CARDIAC CATHETERIZATION N/A 05/13/2016   Procedure: Left Heart Cath and Coronary Angiography;  Surgeon: Jake Bathe, MD;  Location: [facility];  Service: Cardiovascular;  Laterality: N/A;   CARPAL TUNNEL RELEASE Left 10/2009   CARPAL TUNNEL RELEASE Right ~ 2013   CATARACT EXTRACTION W/ INTRAOCULAR LENS IMPLANT Left 2000's   CATARACT EXTRACTION W/ INTRAOCULAR LENS IMPLANT Right 2015   COLONOSCOPY W/ POLYPECTOMY     CORONARY ANGIOPLASTY WITH STENT PLACEMENT  06/26/2014   "1"   CORONARY ANGIOPLASTY WITH STENT PLACEMENT  06/26/2014   pLAD 50%, d LAD 60%, oD1 80%,  mD1  70%, CFX 50%, OM  2 70%,  RCA 80%, PDA 95% (<61mm), OM1 99%-0% with Bio flow stent        CORONARY ANGIOPLASTY WITH STENT PLACEMENT  03/09/2018   CORONARY STENT INTERVENTION N/A 03/09/2018   Procedure: CORONARY STENT INTERVENTION;  Surgeon: Kathleene Hazel, MD;  Location: [facility];  Service: Cardiovascular;  Laterality: N/A;   ELBOW SURGERY Bilateral ~ 2014   "for blockages; Dr. Amanda Pea"   ENDARTERECTOMY Left 04/23/2015   Procedure: ENDARTERECTOMY CAROTID;  Surgeon: Fransisco Hertz, MD;  Location: [facility] OR;  Service: Vascular;  Laterality: Left;   EYE SURGERY Left    "related go diabetes; laser"   KNEE ARTHROSCOPY Left 05/2011   LEFT HEART CATH AND CORONARY ANGIOGRAPHY N/A 03/09/2018   Procedure: LEFT HEART CATH AND CORONARY ANGIOGRAPHY;  Surgeon: Kathleene Hazel, MD;  Location: [facility];  Service: Cardiovascular;  Laterality: N/A;   LEFT HEART CATHETERIZATION WITH  CORONARY ANGIOGRAM N/A 06/26/2014   Procedure: LEFT HEART CATHETERIZATION WITH CORONARY ANGIOGRAM;  Surgeon: Micheline Chapman, MD;  Location: [facility] CATH LAB;  Service: Cardiovascular;  Laterality: N/A;   LEFT HEART CATHETERIZATION WITH CORONARY ANGIOGRAM N/A 08/07/2014   Procedure: LEFT HEART CATHETERIZATION WITH CORONARY ANGIOGRAM;  Surgeon: Micheline Chapman, MD;  Location: [facility] CATH LAB;  Service: Cardiovascular;  Laterality: N/A;   LUMBAR LAMINECTOMY/DECOMPRESSION MICRODISCECTOMY Left 11/02/2017   Procedure: MICRODISCECTOMY LUMBAR THREE- LUMBAR FOUR, LEFT;  Surgeon: Coletta Memos, MD;  Location: [facility] OR;  Service: Neurosurgery;  Laterality: Left;   NASAL SEPTUM SURGERY  1979   PATCH ANGIOPLASTY Left 04/23/2015   Procedure: PATCH ANGIOPLASTY USING 1CM X 6CM XENOSURE BIOLOGIC PATCH;  Surgeon: Fransisco Hertz, MD;  Location: [facility] OR;  Service: Vascular;  Laterality: Left;   POSTERIOR LUMBAR FUSION  1964   SHOULDER OPEN ROTATOR CUFF REPAIR  10/14/2011   Procedure: ROTATOR CUFF REPAIR SHOULDER OPEN;  Surgeon: Javier Docker, MD;  Location: [facility];  Service: Orthopedics;  Laterality: Right;   SUBACROMIAL DECOMPRESSION  10/14/2011   Procedure: SUBACROMIAL DECOMPRESSION;  Surgeon: Javier Docker, MD;  Location: [facility];  Service: Orthopedics;  Laterality: Right;   TRIGGER FINGER RELEASE Right ~ 2013-2014   "3rd & 4th digits"   Social History   Socioeconomic History   Marital status: Married    Spouse name: Not on file   Number of children: 5   Years of education: College   Highest education level: Not on file  Occupational History   Not on file  Tobacco Use   Smoking status: Former    Current packs/day: 0.00    Average packs/day: 0.5 packs/day for 20.0 years (10.0 ttl pk-yrs)    Types: Cigarettes    Start date: 10/11/1968    Quit date: 10/11/1988    Years since quitting: 35.1   Smokeless tobacco: Never  Vaping Use   Vaping status: Never Used  Substance and Sexual Activity   Alcohol use: No     Alcohol/week: 0.0 standard drinks of alcohol    Comment: Quit drinking in 1990   Drug use: No   Sexual activity: Not Currently  Other Topics Concern   Not on file  Social History Narrative   Drinks about 2 cups of coffee a day, occasional diet pepsi.    Social Drivers of Corporate investment banker Strain: Not on file  Food Insecurity: Not on file  Transportation Needs: Not on file  Physical Activity: Not on file  Stress: Not on file  Social Connections: Not  on file   Family History  Problem Relation Age of Onset   Diabetes Mother    Varicose Veins Mother    Heart attack Father 29       "massive heart attack"   Cancer Sister    Diabetes Sister    Hypertension Sister    Varicose Veins Sister    Diabetes Brother    Hypertension Brother    Allergies  Allergen Reactions   Dulaglutide     Other reaction(s): localized site reaction, dyspepsia   Insulin Degludec     Other reaction(s): Itching and swelling in hands   Gabapentin Other (See Comments)    Ataxia   Morphine And Codeine Itching        Naproxen Other (See Comments)    Neurological reaction. Tremors, hallucinates    Current Outpatient Medications  Medication Sig Dispense Refill   acetaminophen (TYLENOL) 325 MG tablet Take 650 mg by mouth every 6 (six) hours as needed for moderate pain or headache.      aspirin 81 MG tablet Take 1 tablet (81 mg total) by mouth daily.     atorvastatin (LIPITOR) 20 MG tablet Take 20 mg by mouth daily.     augmented betamethasone dipropionate (DIPROLENE-AF) 0.05 % cream as needed for itching.     chlorhexidine (PERIDEX) 0.12 % solution SWISH WITH 1-2 OUNCE FOR 30 SECONDS THEN SPIT TWICE DAILY as needed for flare up  0   chlorpheniramine-HYDROcodone (TUSSIONEX) 10-8 MG/5ML SUER as needed for cough.     clopidogrel (PLAVIX) 75 MG tablet Take 1 tablet (75 mg total) by mouth daily. 90 tablet 3   colchicine 0.6 MG tablet Take 0.6 mg by mouth daily as needed (gout).      Cyanocobalamin  (B-12) 5000 MCG CAPS Take 5,000 mcg by mouth daily.     dorzolamide-timolol (COSOPT) 22.3-6.8 MG/ML ophthalmic solution Place 1 drop into both eyes 2 (two) times daily.     empagliflozin (JARDIANCE) 25 MG TABS tablet Take 25 mg by mouth daily.     HUMALOG KWIKPEN 100 UNIT/ML KwikPen INJECT 12 UNITS WITH BREAKFAST AND LUNCH, 24 UNITS WITH SUPPER. ADD CORRECTION ISF 20, TARGET 100. MAX DOSE 100 UNITS PER DAY for 30 days     Insulin Pen Needle 31G X 8 MM MISC      isosorbide mononitrate (IMDUR) 60 MG 24 hr tablet Take 1 tablet by mouth twice daily 180 tablet 3   LANTUS SOLOSTAR 100 UNIT/ML Solostar Pen SMARTSIG:40 Unit(s) SUB-Q Twice Daily     lidocaine (LIDODERM) 5 % Place 1 patch onto the skin daily. Remove & Discard patch within 12 hours or as directed by MD 30 patch 0   lisinopril (ZESTRIL) 5 MG tablet Take 1 tablet (5 mg total) by mouth daily. 90 tablet 2   LUMIGAN 0.01 % SOLN Place 1 drop into the left eye at bedtime.     LYRICA 100 MG capsule Take 50 mg by mouth 2 (two) times daily as needed (1 in the AM and 2 in the PM). For neuropathic pain  2   Melatonin 5 MG TABS Take 5 mg by mouth at bedtime.     metFORMIN (GLUCOPHAGE) 1000 MG tablet Take 1,000 mg by mouth 2 (two) times daily with a meal.     metoprolol tartrate (LOPRESSOR) 100 MG tablet Take 1 tablet (100 mg total) by mouth 2 (two) times daily. 180 tablet 3   Multiple Vitamin (MULTI VITAMIN) TABS daily.     nitroGLYCERIN (NITROSTAT) 0.4  MG SL tablet Place 1 tablet (0.4 mg total) under the tongue every 5 (five) minutes as needed for chest pain. 75 tablet 2   Omega-3 Fatty Acids (FISH OIL PO) Take 520 mg by mouth daily.     pantoprazole (PROTONIX) 40 MG tablet Take 1 tablet (40 mg total) by mouth 2 (two) times daily. 180 tablet 3   Probiotic Product (PROBIOTIC PO) Take 1 tablet by mouth daily.     pyridoxine (B-6) 100 MG tablet Take 100 mg by mouth daily.     triamcinolone cream (KENALOG) 0.1 % Apply 1 application topically daily as  needed (lichen planus). With cetaphil     Current Facility-Administered Medications  Medication Dose Route Frequency Provider Last Rate Last Admin   Hyaluronan (ORTHOVISC) intra-articular injection 30 mg  30 mg Intra-articular Once        Hyaluronan (ORTHOVISC) intra-articular injection 30 mg  30 mg Intra-articular Once        No results found.  Review of Systems:   A ROS was performed including pertinent positives and negatives as documented in the HPI.  Physical Exam :   Constitutional: NAD and appears stated age Neurological: Alert and oriented Psych: Appropriate affect and cooperative There were no vitals taken for this visit.   Comprehensive Musculoskeletal Exam:    Exam of bilateral knees demonstrates tenderness over both the medial and lateral joint lines.  Active range of motion from 0 to 100 degrees with palpable and audible crepitus.  No laxity with varus or valgus stress.  Imaging:    Assessment and Plan:   83 y.o. male with history of bilateral tricompartmental osteoarthritis, which does appear most advanced in the patellofemoral compartments.  Last cortisone injections gave him approximately 1 month of relief and he has previously failed gel injections.  Discussed that we can only repeat cortisone injections every 3 months so unfortunately he is not a candidate for this today.  He has been working with physical therapy downstairs.  I did recommend discussing potential aquatics with PT as I believe this could be beneficial.  Patient does have a pool at home, so he may be a good candidate for an aquatic HEP but may be open to formal aquatic therapy.  Patient plans to trial lidocaine patches and topical NSAIDs.     -Continue with PT for bilateral knee strengthening and consider addition of aquatics -Can return to clinic on 5/26 for repeat cortisone injections     I personally saw and evaluated the patient, and participated in the management and treatment  plan.   Hazle Nordmann, PA-C Orthopedics

## 2023-11-22 ENCOUNTER — Encounter (HOSPITAL_BASED_OUTPATIENT_CLINIC_OR_DEPARTMENT_OTHER): Admitting: Physical Therapy

## 2023-11-24 DIAGNOSIS — E1142 Type 2 diabetes mellitus with diabetic polyneuropathy: Secondary | ICD-10-CM | POA: Diagnosis not present

## 2023-11-24 DIAGNOSIS — Z794 Long term (current) use of insulin: Secondary | ICD-10-CM | POA: Diagnosis not present

## 2023-11-28 DIAGNOSIS — M67431 Ganglion, right wrist: Secondary | ICD-10-CM | POA: Diagnosis not present

## 2023-11-28 DIAGNOSIS — M67432 Ganglion, left wrist: Secondary | ICD-10-CM | POA: Diagnosis not present

## 2023-11-29 ENCOUNTER — Ambulatory Visit (HOSPITAL_BASED_OUTPATIENT_CLINIC_OR_DEPARTMENT_OTHER): Attending: Internal Medicine | Admitting: Physical Therapy

## 2023-11-29 ENCOUNTER — Encounter (HOSPITAL_BASED_OUTPATIENT_CLINIC_OR_DEPARTMENT_OTHER): Payer: Self-pay | Admitting: Physical Therapy

## 2023-11-29 DIAGNOSIS — M5459 Other low back pain: Secondary | ICD-10-CM | POA: Diagnosis not present

## 2023-11-29 DIAGNOSIS — G8929 Other chronic pain: Secondary | ICD-10-CM | POA: Insufficient documentation

## 2023-11-29 DIAGNOSIS — R262 Difficulty in walking, not elsewhere classified: Secondary | ICD-10-CM | POA: Insufficient documentation

## 2023-11-29 DIAGNOSIS — M25561 Pain in right knee: Secondary | ICD-10-CM | POA: Insufficient documentation

## 2023-11-29 DIAGNOSIS — M6281 Muscle weakness (generalized): Secondary | ICD-10-CM | POA: Insufficient documentation

## 2023-11-29 DIAGNOSIS — M25562 Pain in left knee: Secondary | ICD-10-CM | POA: Insufficient documentation

## 2023-11-29 NOTE — Therapy (Signed)
 OUTPATIENT PHYSICAL THERAPY TREATMENT      Patient Name: Seth Young MRN: [id number] DOB:April 01, 1941, 83 y.o., male Today's Date: 11/30/2023  END OF SESSION:  PT End of Session - 11/29/23 1329     Visit Number 12    Number of Visits 14    Date for PT Re-Evaluation 12/14/23    Authorization Type BC[employer] MCR    PT Start Time 1317    PT Stop Time 1357    PT Time Calculation (min) 40 min    Activity Tolerance Patient tolerated treatment well    Behavior During Therapy WFL for tasks assessed/performed                     Past Medical History:  Diagnosis Date   Arthritis    "knees, toes, hands, probably in my back" (03/09/2018)   Ataxia    Basal cell carcinoma    left temporal area-no residual   Colon polyps 10-12-11   past hx.   Coronary artery disease    Exertional angina (HCC) 06/26/2014   Cath with DES to the OM, Bioflow protocol    GERD (gastroesophageal reflux disease)    H/O hiatal hernia    no problems   High cholesterol    History of echocardiogram    Echo 7/19:  Mild LVH, mild focal basal septal hypertrophy, EF 60-65, no RWMA, Gr 1 DD, trivial AI, MAC   Hypertension    Ischemic chest pain (HCC) 08/07/2014   Occasional tremors    Bilateral hands   Peripheral neuropathy    Pneumonia 1940's X 2; ~ 2017   "infant; walking pneumonia" (03/09/2018)   Pseudogout    "it moves around"   Sleep apnea    no cpap used   Type II diabetes mellitus (HCC) dx'd 1982   nsulin started ~ 2000   Vertigo    Past Surgical History:  Procedure Laterality Date   BACK SURGERY     BASAL CELL CARCINOMA EXCISION Left ~ 2012   face   CARDIAC CATHETERIZATION  02/2005   Dr. Santiago Cuff; negative.   CARDIAC CATHETERIZATION N/A 05/13/2016   Procedure: Left Heart Cath and Coronary Angiography;  Surgeon: Hugh Madura, MD;  Location: [facility];  Service: Cardiovascular;  Laterality: N/A;   CARPAL TUNNEL RELEASE Left 10/2009   CARPAL TUNNEL RELEASE Right ~ 2013   CATARACT  EXTRACTION W/ INTRAOCULAR LENS IMPLANT Left 2000's   CATARACT EXTRACTION W/ INTRAOCULAR LENS IMPLANT Right 2015   COLONOSCOPY W/ POLYPECTOMY     CORONARY ANGIOPLASTY WITH STENT PLACEMENT  06/26/2014   "1"   CORONARY ANGIOPLASTY WITH STENT PLACEMENT  06/26/2014   pLAD 50%, d LAD 60%, oD1 80%,  mD1  70%, CFX 50%, OM 2 70%,  RCA 80%, PDA 95% (<10mm), OM1 99%-0% with Bio flow stent        CORONARY ANGIOPLASTY WITH STENT PLACEMENT  03/09/2018   CORONARY STENT INTERVENTION N/A 03/09/2018   Procedure: CORONARY STENT INTERVENTION;  Surgeon: Odie Benne, MD;  Location: [facility];  Service: Cardiovascular;  Laterality: N/A;   ELBOW SURGERY Bilateral ~ 2014   "for blockages; Dr. Aloha Arnold"   ENDARTERECTOMY Left 04/23/2015   Procedure: ENDARTERECTOMY CAROTID;  Surgeon: Arvil Lauber, MD;  Location: [facility] OR;  Service: Vascular;  Laterality: Left;   EYE SURGERY Left    "related go diabetes; laser"   KNEE ARTHROSCOPY Left 05/2011   LEFT HEART CATH AND CORONARY ANGIOGRAPHY N/A 03/09/2018  Procedure: LEFT HEART CATH AND CORONARY ANGIOGRAPHY;  Surgeon: Odie Benne, MD;  Location: [facility];  Service: Cardiovascular;  Laterality: N/A;   LEFT HEART CATHETERIZATION WITH CORONARY ANGIOGRAM N/A 06/26/2014   Procedure: LEFT HEART CATHETERIZATION WITH CORONARY ANGIOGRAM;  Surgeon: Arlander Bellman, MD;  Location: [facility] CATH LAB;  Service: Cardiovascular;  Laterality: N/A;   LEFT HEART CATHETERIZATION WITH CORONARY ANGIOGRAM N/A 08/07/2014   Procedure: LEFT HEART CATHETERIZATION WITH CORONARY ANGIOGRAM;  Surgeon: Arlander Bellman, MD;  Location: [facility] CATH LAB;  Service: Cardiovascular;  Laterality: N/A;   LUMBAR LAMINECTOMY/DECOMPRESSION MICRODISCECTOMY Left 11/02/2017   Procedure: MICRODISCECTOMY LUMBAR THREE- LUMBAR FOUR, LEFT;  Surgeon: Audie Bleacher, MD;  Location: [facility] OR;  Service: Neurosurgery;  Laterality: Left;   NASAL SEPTUM SURGERY  1979   PATCH ANGIOPLASTY Left 04/23/2015    Procedure: PATCH ANGIOPLASTY USING 1CM X 6CM XENOSURE BIOLOGIC PATCH;  Surgeon: Arvil Lauber, MD;  Location: [facility] OR;  Service: Vascular;  Laterality: Left;   POSTERIOR LUMBAR FUSION  1964   SHOULDER OPEN ROTATOR CUFF REPAIR  10/14/2011   Procedure: ROTATOR CUFF REPAIR SHOULDER OPEN;  Surgeon: Loel Ring, MD;  Location: [facility];  Service: Orthopedics;  Laterality: Right;   SUBACROMIAL DECOMPRESSION  10/14/2011   Procedure: SUBACROMIAL DECOMPRESSION;  Surgeon: Loel Ring, MD;  Location: [facility];  Service: Orthopedics;  Laterality: Right;   TRIGGER FINGER RELEASE Right ~ 2013-2014   "3rd & 4th digits"   Patient Active Problem List   Diagnosis Date Noted   Coronary artery disease involving native coronary artery of native heart with angina pectoris (HCC) 03/07/2018   HNP (herniated nucleus pulposus), lumbar 11/02/2017   Carotid artery disease without cerebral infarction (HCC) 04/09/2015   Chest pain of uncertain etiology 06/21/2014   Diabetic neuropathy (HCC) 06/21/2014   Essential hypertension 06/21/2014   Hypercholesterolemia 06/21/2014   Rotator cuff tear 10/15/2011   IDDM (insulin  dependent diabetes mellitus) 10/16/2008    PCP: Suan Elm, MD   REFERRING PROVIDER: Suan Elm, MD   REFERRING DIAG: G89.29 other chronic pain, M54.40 Lumbago with sciatica, unspecified, M25.562 (ICD-10-CM) - Pain in left knee M25.561 (ICD-10-CM) - Pain in right knee  THERAPY DIAG:  Chronic pain of both knees  Other low back pain  Muscle weakness (generalized)  Difficulty in walking, not elsewhere classified  Rationale for Evaluation and Treatment: Rehabilitation  ONSET DATE: PT order 05/11/2022 / Surgery March 2022   SUBJECTIVE:   SUBJECTIVE STATEMENT: Pt states it throws him off really bad when he misses his therapy.  Pt states his R knee has been bothering him.  Pt was walking with the cane, but his R knee started "moving funny".  He returned to using his lofstrand crutch.  Pt  has weakness in his back with standing.  Pt states he has been performing the clamshells, leg raises, and stretching.  Pt saw ortho PA due to R knee pain.       PERTINENT HISTORY: -3 back surgeries:  L3-5 laminotomy and pt thinks he has some screws in lumbar March 2022, Lumbar laminectomy/decompression/microdisctomy in 2019, Lumbar surgery in 1964   -OA, Bilat knee pain R > L, CAD, Peripheral neuropathy, Vertigo, Pseudogout, DM type II and diabetic retinopathy,    -Pt has Insulin  monitor and tremors in bilat Ue's R > L   -PSHx:  R shoulder surgery in 2013 and was unable to repair rotator cuff, Coronary angioplasty with stent placement in 2015 and 2019, and L knee arthroscopy in  2012  PAIN:   Lumbar:  0/10 pain currently, 3/10 worst  Knee pain: 3/10 R, 2/10 L   PRECAUTIONS: Other: multiple back surgeries, peripheral neuropathy, R shoulder RCR   WEIGHT BEARING RESTRICTIONS: No  FALLS:  Has patient fallen in last 6 months? No  LIVING ENVIRONMENT: Lives with: lives with their spouse Lives in: 1 story home Stairs: 3-4 steps with rail Has following equipment at home: Single point cane, Environmental consultant - 2 wheeled, and Crutches  OCCUPATION: Pt works from home.   PLOF: Independent; Pt began using SPC in early 2019.  Pt was able to perform daily mobility and daily activities with greater ease and less difficulty   PATIENT GOALS: to improve strength in legs and get rid of his crutch   OBJECTIVE:  Note: Objective measures were completed at Evaluation unless otherwise noted.  DIAGNOSTIC FINDINGS:  L knee x ray in 11/23: IMPRESSION: 1. Severe patellofemoral and mild-to-moderate medial and lateral compartment osteoarthritis. 2. Small joint effusion.  R knee x ray in 11/23: IMPRESSION: 1. Severe patellofemoral and mild to moderate medial and lateral compartment osteoarthritis. 2. Small joint effusion.  Lumbar: FINDINGS: Segmentation:  Standard.   Alignment:  Trace anterolisthesis L3  on L4.   Vertebrae: No fracture, evidence of discitis, or bone lesion. There is some degenerative endplate signal change at L3-4.   Conus medullaris and cauda equina: Conus extends to the T12 level. Conus and cauda equina appear normal.   Paraspinal and other soft tissues: Negative.   Disc levels:   T11-12 and T12-L1 are imaged in the sagittal plane only and negative.   L1-2: Minimal disc bulge without stenosis.   L2-3: Shallow disc bulge and mild ligamentum flavum thickening. Mild central canal and left foraminal narrowing appear unchanged. The right foramen is open.   L3-4: Status post left laminotomy. There has been progression of disease at this level. A new and very large central left paracentral disc protrusion extends into the left foramen. There is severe compression of the thecal sac and narrowing in the lateral recesses, worse on the left. Moderate bilateral foraminal narrowing is worse on the left.   L4-5: Status post left laminotomy as seen on the prior exam. There is a shallow broad-based disc bulge. The narrowing in the subarticular recesses is present which could impact either descending L5 root. Mild bilateral foraminal narrowing. No change.   L5-S1: A right subarticular recess protrusion impinging on the right S1 root appears unchanged. Foramina are open.   IMPRESSION: Since the prior MRI, the patient has developed a large central and left paracentral disc protrusion at L3-4 causing severe compression of the thecal sac and narrowing in the lateral recesses, worse on the left. Moderate bilateral foraminal narrowing at this level is worse on the left. Prior left laminotomy noted.   No change in a broad-based disc bulge at L4-5 causing narrowing in the subarticular recesses which could impact either descending L5 root. Left laminotomy defect is seen at this level.   No change in a right subarticular recess protrusion at L5-S1. The disc appears to impinge  on the right S1 root.   TODAY'S TREATMENT:  Ther - Ex: Nustep L4 x  6 mins just LE's Sidestepping with bilat UE support at wall 10 steps x 2 laps  LAQ with 5# 3x10 bilat Seated HS curls with GTB 3 x 10 reps bilat Seated HS stretch 2x30 sec bilat Cable rows in standing 10# 2x10 with SBA   Therapeutic Activities: Pt ambulated around hurdles (4) with SPC. Pt stepped over 1 hurdle with UE support with SBA.    PATIENT EDUCATION:  Education details:  PT instructed pt to perform HEP.  Importance and rationale of performing HEP.  gait, rationale of interventions, and POC. Person educated: Patient Education method: Explanation, verbal cues, demonstration Education comprehension: verbalized understanding, returned demonstration, verbal cues required  HOME EXERCISE PROGRAM: Pt already has a HEP from prior PT.  Access Code: [email or web address]   ASSESSMENT:  CLINICAL IMPRESSION: Pt was walking with the cane, but has returned to using his lofstrand crutch due to R knee pain.  He continues to have weakness in his back with standing and limited ambulation distance.  Pt has increased performance of HEP.  PT worked on improving mobility and gait with ambulating around hurdles and stepping over a hurdle.  Pt didn't want to increase resistance with LAQ or perform sidestepping with band due to fear of aggravating knee.  PT did not have pt perform sit to stands due to R knee.  He gave great effort with all exercises and activities.  Pt responded well to Rx reporting no increased pain after Rx.   OBJECTIVE IMPAIRMENTS: Abnormal gait, decreased activity tolerance, decreased balance, decreased endurance, decreased mobility, difficulty walking, decreased ROM, decreased strength, hypomobility, impaired flexibility, and pain.   ACTIVITY LIMITATIONS: standing, squatting, stairs, transfers, and  locomotion level  PARTICIPATION LIMITATIONS: meal prep, cleaning, shopping, and community activity  PERSONAL FACTORS: Time since onset of injury/illness/exacerbation and 3+ comorbidities:  multiple back surgeries, OA, Peripheral neuropathy, Pseudogout, DM type II  are also affecting patient's functional outcome.   REHAB POTENTIAL: Good   CLINICAL DECISION MAKING: Evolving/moderate complexity   EVALUATION COMPLEXITY: Moderate   GOALS:   SHORT TERM GOALS: Target date: 07/14/2023  Pt will report improved compliance with HEP for improved strength, pain, mobility, and function.  Baseline: Goal status: GOAL MET  1/29  2.  Pt will report at least a 40% improvement in performance of and tolerance with functional mobility.  Baseline:  Goal status:  Goal MET  3/19   3.  Pt will increase ambulation distance by at least 100 ft for improved tolerance with community ambulation.  Baseline:  Goal status: GOAL MET  1/29    LONG TERM GOALS: Target date:  12/14/2023   Pt will be able to turn with gait with improved ease and good stability.  Baseline:  Goal status: NOT MET  3/19  2.  Pt will report at least a 50% improvement in tolerance with standing.  Baseline:  Goal status:  90% MET  3/19  3.  Pt will demo at least an 8-10# increase in bilat hip flex and abd strength, 5-10# increase in knee flexion, and 5/5 in knee extension bilat strength for improved performance of and tolerance with functional mobility.  Baseline:  Goal status: 75% MET   3/19  4.  Pt will be able to ambulate > 600 ft on 6 MWT for improved community ambulation.   Baseline:  Goal status:  GOAL MET   3/19  5.  Pt will ambulate at least 670 ft on the 6 MWT for improved ambulation  distance.   Goal status:  NOT MET    PLAN:  PT FREQUENCY: 1x/wk   PT DURATION: 4 weeks   (will put cert out for 7 weeks due to clinic schedule availability.)  PLANNED INTERVENTIONS: Therapeutic exercises, Therapeutic activity,  Neuromuscular re-education, Balance training, Gait training, Patient/Family education, Self Care, Joint mobilization, Stair training, Aquatic Therapy, Dry Needling, Electrical stimulation, Cryotherapy, Moist heat, Taping, Manual therapy, and Re-evaluation.   PLAN FOR NEXT SESSION:  LE strengthening, gait, balance, and improve standing tolerance.    Trina Fujita III PT, DPT 11/30/23 7:14 PM

## 2023-12-06 ENCOUNTER — Ambulatory Visit (HOSPITAL_BASED_OUTPATIENT_CLINIC_OR_DEPARTMENT_OTHER): Admitting: Physical Therapy

## 2023-12-06 ENCOUNTER — Encounter (HOSPITAL_BASED_OUTPATIENT_CLINIC_OR_DEPARTMENT_OTHER): Payer: Self-pay | Admitting: Physical Therapy

## 2023-12-06 DIAGNOSIS — R262 Difficulty in walking, not elsewhere classified: Secondary | ICD-10-CM

## 2023-12-06 DIAGNOSIS — M6281 Muscle weakness (generalized): Secondary | ICD-10-CM

## 2023-12-06 DIAGNOSIS — G8929 Other chronic pain: Secondary | ICD-10-CM

## 2023-12-06 DIAGNOSIS — M25561 Pain in right knee: Secondary | ICD-10-CM | POA: Diagnosis not present

## 2023-12-06 DIAGNOSIS — M5459 Other low back pain: Secondary | ICD-10-CM | POA: Diagnosis not present

## 2023-12-06 DIAGNOSIS — M25562 Pain in left knee: Secondary | ICD-10-CM | POA: Diagnosis not present

## 2023-12-06 NOTE — Therapy (Signed)
 OUTPATIENT PHYSICAL THERAPY TREATMENT      Patient Name: Seth Young MRN: [id number] DOB:02/28/1941, 83 y.o., male Today's Date: 12/07/2023  END OF SESSION:  PT End of Session - 12/06/23 1320     Visit Number 13    Number of Visits 13    Date for PT Re-Evaluation 12/14/23    Authorization Type [employer] MCR    PT Start Time 1317    PT Stop Time 1402    PT Time Calculation (min) 45 min    Activity Tolerance Patient tolerated treatment well    Behavior During Therapy WFL for tasks assessed/performed                     Past Medical History:  Diagnosis Date   Arthritis    "knees, toes, hands, probably in my back" (03/09/2018)   Ataxia    Basal cell carcinoma    left temporal area-no residual   Colon polyps 10-12-11   past hx.   Coronary artery disease    Exertional angina (HCC) 06/26/2014   Cath with DES to the OM, Bioflow protocol    GERD (gastroesophageal reflux disease)    H/O hiatal hernia    no problems   High cholesterol    History of echocardiogram    Echo 7/19:  Mild LVH, mild focal basal septal hypertrophy, EF 60-65, no RWMA, Gr 1 DD, trivial AI, MAC   Hypertension    Ischemic chest pain (HCC) 08/07/2014   Occasional tremors    Bilateral hands   Peripheral neuropathy    Pneumonia 1940's X 2; ~ 2017   "infant; walking pneumonia" (03/09/2018)   Pseudogout    "it moves around"   Sleep apnea    no cpap used   Type II diabetes mellitus (HCC) dx'd 1982   nsulin started ~ 2000   Vertigo    Past Surgical History:  Procedure Laterality Date   BACK SURGERY     BASAL CELL CARCINOMA EXCISION Left ~ 2012   face   CARDIAC CATHETERIZATION  02/2005   Dr. Santiago Cuff; negative.   CARDIAC CATHETERIZATION N/A 05/13/2016   Procedure: Left Heart Cath and Coronary Angiography;  Surgeon: Hugh Madura, MD;  Location: [facility];  Service: Cardiovascular;  Laterality: N/A;   CARPAL TUNNEL RELEASE Left 10/2009   CARPAL TUNNEL RELEASE Right ~ 2013   CATARACT  EXTRACTION W/ INTRAOCULAR LENS IMPLANT Left 2000's   CATARACT EXTRACTION W/ INTRAOCULAR LENS IMPLANT Right 2015   COLONOSCOPY W/ POLYPECTOMY     CORONARY ANGIOPLASTY WITH STENT PLACEMENT  06/26/2014   "1"   CORONARY ANGIOPLASTY WITH STENT PLACEMENT  06/26/2014   pLAD 50%, d LAD 60%, oD1 80%,  mD1  70%, CFX 50%, OM 2 70%,  RCA 80%, PDA 95% (<34mm), OM1 99%-0% with Bio flow stent        CORONARY ANGIOPLASTY WITH STENT PLACEMENT  03/09/2018   CORONARY STENT INTERVENTION N/A 03/09/2018   Procedure: CORONARY STENT INTERVENTION;  Surgeon: Odie Benne, MD;  Location: [facility];  Service: Cardiovascular;  Laterality: N/A;   ELBOW SURGERY Bilateral ~ 2014   "for blockages; Dr. Aloha Arnold"   ENDARTERECTOMY Left 04/23/2015   Procedure: ENDARTERECTOMY CAROTID;  Surgeon: Arvil Lauber, MD;  Location: [facility];  Service: Vascular;  Laterality: Left;   EYE SURGERY Left    "related go diabetes; laser"   KNEE ARTHROSCOPY Left 05/2011   LEFT HEART CATH AND CORONARY ANGIOGRAPHY N/A 03/09/2018  Procedure: LEFT HEART CATH AND CORONARY ANGIOGRAPHY;  Surgeon: Odie Benne, MD;  Location: [facility];  Service: Cardiovascular;  Laterality: N/A;   LEFT HEART CATHETERIZATION WITH CORONARY ANGIOGRAM N/A 06/26/2014   Procedure: LEFT HEART CATHETERIZATION WITH CORONARY ANGIOGRAM;  Surgeon: Arlander Bellman, MD;  Location: [facility] CATH LAB;  Service: Cardiovascular;  Laterality: N/A;   LEFT HEART CATHETERIZATION WITH CORONARY ANGIOGRAM N/A 08/07/2014   Procedure: LEFT HEART CATHETERIZATION WITH CORONARY ANGIOGRAM;  Surgeon: Arlander Bellman, MD;  Location: [facility] CATH LAB;  Service: Cardiovascular;  Laterality: N/A;   LUMBAR LAMINECTOMY/DECOMPRESSION MICRODISCECTOMY Left 11/02/2017   Procedure: MICRODISCECTOMY LUMBAR THREE- LUMBAR FOUR, LEFT;  Surgeon: Audie Bleacher, MD;  Location: [facility] OR;  Service: Neurosurgery;  Laterality: Left;   NASAL SEPTUM SURGERY  1979   PATCH ANGIOPLASTY Left 04/23/2015    Procedure: PATCH ANGIOPLASTY USING 1CM X 6CM XENOSURE BIOLOGIC PATCH;  Surgeon: Arvil Lauber, MD;  Location: [facility] OR;  Service: Vascular;  Laterality: Left;   POSTERIOR LUMBAR FUSION  1964   SHOULDER OPEN ROTATOR CUFF REPAIR  10/14/2011   Procedure: ROTATOR CUFF REPAIR SHOULDER OPEN;  Surgeon: Loel Ring, MD;  Location: [facility];  Service: Orthopedics;  Laterality: Right;   SUBACROMIAL DECOMPRESSION  10/14/2011   Procedure: SUBACROMIAL DECOMPRESSION;  Surgeon: Loel Ring, MD;  Location: [facility];  Service: Orthopedics;  Laterality: Right;   TRIGGER FINGER RELEASE Right ~ 2013-2014   "3rd & 4th digits"   Patient Active Problem List   Diagnosis Date Noted   Coronary artery disease involving native coronary artery of native heart with angina pectoris (HCC) 03/07/2018   HNP (herniated nucleus pulposus), lumbar 11/02/2017   Carotid artery disease without cerebral infarction (HCC) 04/09/2015   Chest pain of uncertain etiology 06/21/2014   Diabetic neuropathy (HCC) 06/21/2014   Essential hypertension 06/21/2014   Hypercholesterolemia 06/21/2014   Rotator cuff tear 10/15/2011   IDDM (insulin  dependent diabetes mellitus) 10/16/2008    PCP: Suan Elm, MD   REFERRING PROVIDER: Suan Elm, MD   REFERRING DIAG: G89.29 other chronic pain, M54.40 Lumbago with sciatica, unspecified, M25.562 (ICD-10-CM) - Pain in left knee M25.561 (ICD-10-CM) - Pain in right knee  THERAPY DIAG:  Chronic pain of both knees  Other low back pain  Muscle weakness (generalized)  Difficulty in walking, not elsewhere classified  Rationale for Evaluation and Treatment: Rehabilitation  ONSET DATE: PT order 05/11/2022 / Surgery March 2022   SUBJECTIVE:   SUBJECTIVE STATEMENT: Pt denies any adverse effects after prior Rx.  Pt took Tylenol  before Rx.  Pt states his R knee has been bothering him.  Pt reports a 40% improvement in tolerance  with standing.  Pt reports he just opened his pool up and plans to  perform exercises in the pool.  He has been performing his HEP.          PERTINENT HISTORY: -3 back surgeries:  L3-5 laminotomy and pt thinks he has some screws in lumbar March 2022, Lumbar laminectomy/decompression/microdisctomy in 2019, Lumbar surgery in 1964   -OA, Bilat knee pain R > L, CAD, Peripheral neuropathy, Vertigo, Pseudogout, DM type II and diabetic retinopathy,    -Pt has Insulin  monitor and tremors in bilat Ue's R > L   -PSHx:  R shoulder surgery in 2013 and was unable to repair rotator cuff, Coronary angioplasty with stent placement in 2015 and 2019, and L knee arthroscopy in 2012  PAIN:   Lumbar and L post hip:  1-2/10 pain currently, 3/10  worst  Knee pain: 1/10 R, 0/10 L   PRECAUTIONS: Other: multiple back surgeries, peripheral neuropathy, R shoulder RCR   WEIGHT BEARING RESTRICTIONS: No  FALLS:  Has patient fallen in last 6 months? No  LIVING ENVIRONMENT: Lives with: lives with their spouse Lives in: 1 story home Stairs: 3-4 steps with rail Has following equipment at home: Single point cane, Environmental consultant - 2 wheeled, and Crutches  OCCUPATION: Pt works from home.   PLOF: Independent; Pt began using SPC in early 2019.  Pt was able to perform daily mobility and daily activities with greater ease and less difficulty   PATIENT GOALS: to improve strength in legs and get rid of his crutch   OBJECTIVE:  Note: Objective measures were completed at Evaluation unless otherwise noted.  DIAGNOSTIC FINDINGS:  L knee x ray in 11/23: IMPRESSION: 1. Severe patellofemoral and mild-to-moderate medial and lateral compartment osteoarthritis. 2. Small joint effusion.  R knee x ray in 11/23: IMPRESSION: 1. Severe patellofemoral and mild to moderate medial and lateral compartment osteoarthritis. 2. Small joint effusion.  Lumbar: FINDINGS: Segmentation:  Standard.   Alignment:  Trace anterolisthesis L3 on L4.   Vertebrae: No fracture, evidence of discitis, or bone  lesion. There is some degenerative endplate signal change at L3-4.   Conus medullaris and cauda equina: Conus extends to the T12 level. Conus and cauda equina appear normal.   Paraspinal and other soft tissues: Negative.   Disc levels:   T11-12 and T12-L1 are imaged in the sagittal plane only and negative.   L1-2: Minimal disc bulge without stenosis.   L2-3: Shallow disc bulge and mild ligamentum flavum thickening. Mild central canal and left foraminal narrowing appear unchanged. The right foramen is open.   L3-4: Status post left laminotomy. There has been progression of disease at this level. A new and very large central left paracentral disc protrusion extends into the left foramen. There is severe compression of the thecal sac and narrowing in the lateral recesses, worse on the left. Moderate bilateral foraminal narrowing is worse on the left.   L4-5: Status post left laminotomy as seen on the prior exam. There is a shallow broad-based disc bulge. The narrowing in the subarticular recesses is present which could impact either descending L5 root. Mild bilateral foraminal narrowing. No change.   L5-S1: A right subarticular recess protrusion impinging on the right S1 root appears unchanged. Foramina are open.   IMPRESSION: Since the prior MRI, the patient has developed a large central and left paracentral disc protrusion at L3-4 causing severe compression of the thecal sac and narrowing in the lateral recesses, worse on the left. Moderate bilateral foraminal narrowing at this level is worse on the left. Prior left laminotomy noted.   No change in a broad-based disc bulge at L4-5 causing narrowing in the subarticular recesses which could impact either descending L5 root. Left laminotomy defect is seen at this level.   No change in a right subarticular recess protrusion at L5-S1. The disc appears to impinge on the right S1 root.   TODAY'S TREATMENT:  Ther - Ex: Nustep L4 x  5 mins just LE's LAQ with 5# 3x10 bilat Seated HS curls with GTB 3 x 10 reps bilat Seated HS stretch 2x30 sec bilat Cable rows in standing 10# 2x10 with with min assist for one occasion of LOB, SBA otherwise Cable shoulder extension 10# 2x10 with min assist for one occasion of LOB, SBA otherwise  Hip abduction strength:  (HHD) R: 35.0, L:  44.9  PT updated HEP and gave pt a HEP handout.  PT educated pt in correct form and appropriate frequency.     FOTO:  47/41/38.  Goal of 58.   PATIENT EDUCATION:  Education details:  Goal progress, HEP, exercise form, rationale of interventions, and POC/discharge planning.  PT answered pt's questions. Person educated: Patient Education method: Explanation, verbal cues, demonstration, handout Education comprehension: verbalized understanding, returned demonstration, verbal cues required  HOME EXERCISE PROGRAM: Pt already has a HEP from prior PT.  Access Code: [email or web address]   Updated HEP: - Seated Long Arc Quad with Ankle Weight  - 1 x daily - 2-3 x weekly - 3 sets - 10 reps - Seated Knee Flexion with Anchored Resistance  - 1 x daily - 2-3 x weekly - 3 sets - 10 reps  ASSESSMENT:  CLINICAL IMPRESSION: Pt's R knee continues to bother him.  Pt is using the lofstrand crutch due to R knee pain.  He continues to have weakness in his back with standing and limited ambulation distance.  Pt has increased compliance with HEP and has been performing his HEP.  PT reviewed HEP and updated HEP.  PT gave pt a HEP handout and pt demonstrates good understanding.  Pt did have some balance issues with standing cable rows and extension.  PT provided min assist for one occasion of LOB and SBA otherwise.  PT instructed pt to perform seated rows with theraband at home.  Pt demonstrates improved hip abd strength bilat.  Pt's FOTO score has  worsened demonstrating decreased self perceived disability.  Pt has made good progress toward goals.  He has met all STG's and LTG's #3,4 and partially met LTG #2.  PT educated pt concerning progress, HEP, and POC and pt is agreeable with discharge.   OBJECTIVE IMPAIRMENTS: Abnormal gait, decreased activity tolerance, decreased balance, decreased endurance, decreased mobility, difficulty walking, decreased ROM, decreased strength, hypomobility, impaired flexibility, and pain.   ACTIVITY LIMITATIONS: standing, squatting, stairs, transfers, and locomotion level  PARTICIPATION LIMITATIONS: meal prep, cleaning, shopping, and community activity  PERSONAL FACTORS: Time since onset of injury/illness/exacerbation and 3+ comorbidities:  multiple back surgeries, OA, Peripheral neuropathy, Pseudogout, DM type II  are also affecting patient's functional outcome.   REHAB POTENTIAL: Good   CLINICAL DECISION MAKING: Evolving/moderate complexity   EVALUATION COMPLEXITY: Moderate   GOALS:   SHORT TERM GOALS: Target date: 07/14/2023  Pt will report improved compliance with HEP for improved strength, pain, mobility, and function.  Baseline: Goal status: GOAL MET  1/29  2.  Pt will report at least a 40% improvement in performance of and tolerance with functional mobility.  Baseline:  Goal status:  Goal MET  3/19   3.  Pt will increase ambulation distance by at least 100 ft for improved tolerance with community ambulation.  Baseline:  Goal status: GOAL MET  1/29    LONG TERM GOALS: Target date:  12/14/2023   Pt will be able to turn with gait with improved ease and good stability.  Baseline:  Goal status: NOT MET  3/19  2.  Pt will report at least a 50% improvement in tolerance with standing.  Baseline:  Goal status:  80% MET  4/29  3.  Pt will demo at least an 8-10# increase in bilat hip flex and abd strength, 5-10# increase in knee flexion, and 5/5 in knee extension bilat strength for  improved performance of and tolerance with functional mobility.  Baseline:  Goal status: GOAL MET   4/29  4.  Pt will be able to ambulate > 600 ft on 6 MWT for improved community ambulation.   Baseline:  Goal status:  GOAL MET   3/19  5.  Pt will ambulate at least 670 ft on the 6 MWT for improved ambulation distance.   Goal status:  NOT MET--not tested today    PLAN:   PLANNED INTERVENTIONS: Therapeutic exercises, Therapeutic activity, Neuromuscular re-education, Balance training, Gait training, Patient/Family education, Self Care, Joint mobilization, Stair training, Aquatic Therapy, Dry Needling, Electrical stimulation, Cryotherapy, Moist heat, Taping, Manual therapy, and Re-evaluation.   PLAN FOR NEXT SESSION:  Pt to be discharged from skilled PT due to making good progress toward goals.  He will cont with HEP.  Pt is appreciative of skilled PT and is agreeable with discharge.   PHYSICAL THERAPY DISCHARGE SUMMARY  Visits from Start of Care: 13  Current functional level related to goals / functional outcomes: See above   Remaining deficits: See above   Education / Equipment: See above    Trina Fujita III PT, DPT 12/07/23 8:57 PM

## 2023-12-07 ENCOUNTER — Encounter (HOSPITAL_BASED_OUTPATIENT_CLINIC_OR_DEPARTMENT_OTHER): Admitting: Physical Therapy

## 2023-12-07 ENCOUNTER — Encounter (HOSPITAL_BASED_OUTPATIENT_CLINIC_OR_DEPARTMENT_OTHER): Payer: Self-pay | Admitting: Physical Therapy

## 2023-12-07 DIAGNOSIS — H401123 Primary open-angle glaucoma, left eye, severe stage: Secondary | ICD-10-CM | POA: Diagnosis not present

## 2023-12-10 DIAGNOSIS — E1129 Type 2 diabetes mellitus with other diabetic kidney complication: Secondary | ICD-10-CM | POA: Diagnosis not present

## 2023-12-19 DIAGNOSIS — E1129 Type 2 diabetes mellitus with other diabetic kidney complication: Secondary | ICD-10-CM | POA: Diagnosis not present

## 2023-12-19 DIAGNOSIS — Z125 Encounter for screening for malignant neoplasm of prostate: Secondary | ICD-10-CM | POA: Diagnosis not present

## 2023-12-19 DIAGNOSIS — E782 Mixed hyperlipidemia: Secondary | ICD-10-CM | POA: Diagnosis not present

## 2023-12-26 DIAGNOSIS — R82998 Other abnormal findings in urine: Secondary | ICD-10-CM | POA: Diagnosis not present

## 2023-12-26 DIAGNOSIS — Z0000 Encounter for general adult medical examination without abnormal findings: Secondary | ICD-10-CM | POA: Diagnosis not present

## 2023-12-26 DIAGNOSIS — E1129 Type 2 diabetes mellitus with other diabetic kidney complication: Secondary | ICD-10-CM | POA: Diagnosis not present

## 2023-12-26 DIAGNOSIS — Z1331 Encounter for screening for depression: Secondary | ICD-10-CM | POA: Diagnosis not present

## 2023-12-26 DIAGNOSIS — Z1339 Encounter for screening examination for other mental health and behavioral disorders: Secondary | ICD-10-CM | POA: Diagnosis not present

## 2023-12-28 DIAGNOSIS — H401123 Primary open-angle glaucoma, left eye, severe stage: Secondary | ICD-10-CM | POA: Diagnosis not present

## 2024-01-18 DIAGNOSIS — E1129 Type 2 diabetes mellitus with other diabetic kidney complication: Secondary | ICD-10-CM | POA: Diagnosis not present

## 2024-02-27 DIAGNOSIS — E1142 Type 2 diabetes mellitus with diabetic polyneuropathy: Secondary | ICD-10-CM | POA: Diagnosis not present

## 2024-02-27 DIAGNOSIS — I10 Essential (primary) hypertension: Secondary | ICD-10-CM | POA: Diagnosis not present

## 2024-02-27 DIAGNOSIS — I779 Disorder of arteries and arterioles, unspecified: Secondary | ICD-10-CM | POA: Diagnosis not present

## 2024-02-27 DIAGNOSIS — I2510 Atherosclerotic heart disease of native coronary artery without angina pectoris: Secondary | ICD-10-CM | POA: Diagnosis not present

## 2024-03-09 DIAGNOSIS — E1122 Type 2 diabetes mellitus with diabetic chronic kidney disease: Secondary | ICD-10-CM | POA: Diagnosis not present

## 2024-03-09 DIAGNOSIS — E1140 Type 2 diabetes mellitus with diabetic neuropathy, unspecified: Secondary | ICD-10-CM | POA: Diagnosis not present

## 2024-03-09 DIAGNOSIS — E11319 Type 2 diabetes mellitus with unspecified diabetic retinopathy without macular edema: Secondary | ICD-10-CM | POA: Diagnosis not present

## 2024-03-14 ENCOUNTER — Ambulatory Visit (HOSPITAL_BASED_OUTPATIENT_CLINIC_OR_DEPARTMENT_OTHER): Payer: Self-pay | Admitting: Orthopaedic Surgery

## 2024-03-14 ENCOUNTER — Ambulatory Visit (HOSPITAL_BASED_OUTPATIENT_CLINIC_OR_DEPARTMENT_OTHER): Admitting: Orthopaedic Surgery

## 2024-03-14 ENCOUNTER — Ambulatory Visit (HOSPITAL_BASED_OUTPATIENT_CLINIC_OR_DEPARTMENT_OTHER)

## 2024-03-14 DIAGNOSIS — M170 Bilateral primary osteoarthritis of knee: Secondary | ICD-10-CM

## 2024-03-14 DIAGNOSIS — M25561 Pain in right knee: Secondary | ICD-10-CM | POA: Diagnosis not present

## 2024-03-14 DIAGNOSIS — M25562 Pain in left knee: Secondary | ICD-10-CM

## 2024-03-14 DIAGNOSIS — M25461 Effusion, right knee: Secondary | ICD-10-CM | POA: Diagnosis not present

## 2024-03-14 MED ORDER — LIDOCAINE HCL 1 % IJ SOLN
4.0000 mL | INTRAMUSCULAR | Status: AC | PRN
Start: 2024-03-14 — End: 2024-03-14
  Administered 2024-03-14: 4 mL

## 2024-03-14 MED ORDER — TRIAMCINOLONE ACETONIDE 40 MG/ML IJ SUSP
80.0000 mg | INTRAMUSCULAR | Status: AC | PRN
Start: 2024-03-14 — End: 2024-03-14
  Administered 2024-03-14: 80 mg via INTRA_ARTICULAR

## 2024-03-14 NOTE — Progress Notes (Signed)
 Chief Complaint: Right knee pain     History of Present Illness:   03/14/2024: Today seeking bilateral knee injections.  He is still having persistent symptoms which have worn off  Seth Young is a 83 y.o. male presents today with right worse than left knee pain which has been going on for several years.  He states that he did have an injection into the knees several years prior which did give him relief.  He has not had anything since then.  He has recently had a series of several back surgeries and as result is hoping to avoid any type of surgical intervention for the knees.  He is complaining of crepitus as well as pain going up and down stairs or with most activities of daily living.  He does enjoy traveling in a spare time although this has been limited as result of his knee pain.    Surgical History:   None  PMH/PSH/Family History/Social History/Meds/Allergies:    Past Medical History:  Diagnosis Date  . Arthritis    knees, toes, hands, probably in my back (03/09/2018)  . Ataxia   . Basal cell carcinoma    left temporal area-no residual  . Colon polyps 10-12-11   past hx.  . Coronary artery disease   . Exertional angina (HCC) 06/26/2014   Cath with DES to the OM, Bioflow protocol   . GERD (gastroesophageal reflux disease)   . H/O hiatal hernia    no problems  . High cholesterol   . History of echocardiogram    Echo 7/19:  Mild LVH, mild focal basal septal hypertrophy, EF 60-65, no RWMA, Gr 1 DD, trivial AI, MAC  . Hypertension   . Ischemic chest pain (HCC) 08/07/2014  . Occasional tremors    Bilateral hands  . Peripheral neuropathy   . Pneumonia 1940's X 2; ~ 2017   infant; walking pneumonia (03/09/2018)  . Pseudogout    it moves around  . Sleep apnea    no cpap used  . Type II diabetes mellitus (HCC) dx'd 1982   nsulin started ~ 2000  . Vertigo    Past Surgical History:  Procedure Laterality Date  . BACK SURGERY    .  BASAL CELL CARCINOMA EXCISION Left ~ 2012   face  . CARDIAC CATHETERIZATION  02/2005   Dr. Dominick; negative.  SABRA CARDIAC CATHETERIZATION N/A 05/13/2016   Procedure: Left Heart Cath and Coronary Angiography;  Surgeon: Oneil JAYSON Parchment, MD;  Location: [facility];  Service: Cardiovascular;  Laterality: N/A;  . CARPAL TUNNEL RELEASE Left 10/2009  . CARPAL TUNNEL RELEASE Right ~ 2013  . CATARACT EXTRACTION W/ INTRAOCULAR LENS IMPLANT Left 2000's  . CATARACT EXTRACTION W/ INTRAOCULAR LENS IMPLANT Right 2015  . COLONOSCOPY W/ POLYPECTOMY    . CORONARY ANGIOPLASTY WITH STENT PLACEMENT  06/26/2014   1  . CORONARY ANGIOPLASTY WITH STENT PLACEMENT  06/26/2014   pLAD 50%, d LAD 60%, oD1 80%,  mD1  70%, CFX 50%, OM 2 70%,  RCA 80%, PDA 95% (<11mm), OM1 99%-0% with Bio flow stent       . CORONARY ANGIOPLASTY WITH STENT PLACEMENT  03/09/2018  . CORONARY STENT INTERVENTION N/A 03/09/2018   Procedure: CORONARY STENT INTERVENTION;  Surgeon: Verlin Lonni BIRCH, MD;  Location: [facility];  Service: Cardiovascular;  Laterality: N/A;  . ELBOW SURGERY Bilateral ~ 2014   for blockages; Dr. Camella  . ENDARTERECTOMY Left 04/23/2015   Procedure: ENDARTERECTOMY CAROTID;  Surgeon: Redell LITTIE Door, MD;  Location: [facility] OR;  Service: Vascular;  Laterality: Left;  . EYE SURGERY Left    related go diabetes; laser  . KNEE ARTHROSCOPY Left 05/2011  . LEFT HEART CATH AND CORONARY ANGIOGRAPHY N/A 03/09/2018   Procedure: LEFT HEART CATH AND CORONARY ANGIOGRAPHY;  Surgeon: Verlin Lonni BIRCH, MD;  Location: [facility];  Service: Cardiovascular;  Laterality: N/A;  . LEFT HEART CATHETERIZATION WITH CORONARY ANGIOGRAM N/A 06/26/2014   Procedure: LEFT HEART CATHETERIZATION WITH CORONARY ANGIOGRAM;  Surgeon: Ozell BIRCH Fell, MD;  Location: [facility] C[facility] LAB;  Service: Cardiovascular;  Laterality: N/A;  . LEFT HEART CATHETERIZATION WITH CORONARY ANGIOGRAM N/A 08/07/2014   Procedure: LEFT HEART CATHETERIZATION  WITH CORONARY ANGIOGRAM;  Surgeon: Ozell BIRCH Fell, MD;  Location: [facility] CATH LAB;  Service: Cardiovascular;  Laterality: N/A;  . LUMBAR LAMINECTOMY/DECOMPRESSION MICRODISCECTOMY Left 11/02/2017   Procedure: MICRODISCECTOMY LUMBAR THREE- LUMBAR FOUR, LEFT;  Surgeon: Gillie Duncans, MD;  Location: [facility] OR;  Service: Neurosurgery;  Laterality: Left;  . NASAL SEPTUM SURGERY  1979  . PATCH ANGIOPLASTY Left 04/23/2015   Procedure: PATCH ANGIOPLASTY USING 1CM X 6CM XENOSURE BIOLOGIC PATCH;  Surgeon: Redell LITTIE Door, MD;  Location: [facility] OR;  Service: Vascular;  Laterality: Left;  . POSTERIOR LUMBAR FUSION  1964  . SHOULDER OPEN ROTATOR CUFF REPAIR  10/14/2011   Procedure: ROTATOR CUFF REPAIR SHOULDER OPEN;  Surgeon: Reyes JAYSON Billing, MD;  Location: [facility] ORS;  Service: Orthopedics;  Laterality: Right;  . SUBACROMIAL DECOMPRESSION  10/14/2011   Procedure: SUBACROMIAL DECOMPRESSION;  Surgeon: Reyes JAYSON Billing, MD;  Location: [facility] ORS;  Service: Orthopedics;  Laterality: Right;  . TRIGGER FINGER RELEASE Right ~ 2013-2014   3rd & 4th digits   Social History   Socioeconomic History  . Marital status: Married    Spouse name: Not on file  . Number of children: 5  . Years of education: College  . Highest education level: Not on file  Occupational History  . Not on file  Tobacco Use  . Smoking status: Former    Current packs/day: 0.00    Average packs/day: 0.5 packs/day for 20.0 years (10.0 ttl pk-yrs)    Types: Cigarettes    Start date: 10/11/1968    Quit date: 10/11/1988    Years since quitting: 35.4  . Smokeless tobacco: Never  Vaping Use  . Vaping status: Never Used  Substance and Sexual Activity  . Alcohol  use: No    Alcohol /week: 0.0 standard drinks of alcohol     Comment: Quit drinking in 1990  . Drug use: No  . Sexual activity: Not Currently  Other Topics Concern  . Not on file  Social History Narrative   Drinks about 2 cups of coffee a day, occasional diet pepsi.    Social Drivers of Manufacturing engineer Strain: Not on file  Food Insecurity: Low Risk  (11/28/2023)   Received from [facility]   Hunger Vital Sign   . Within the past 12 months, you worried that your food would run out before you got money to buy more: Never true   . Within the past 12 months, the food you bought just didn't last and you didn't have money to get more. : Never true  Transportation Needs: No Transportation Needs (11/28/2023)   Received from [facility]   Transportation   . In the past 12 months, has lack of reliable transportation kept you from medical appointments, meetings, work or from getting things needed for daily living? : No  Physical Activity: Not on file  Stress: Not on file  Social Connections: Not on file   Family History  Problem Relation Age of Onset  . Diabetes Mother   . Varicose Veins Mother   . Heart attack Father 48       massive heart attack  . Cancer Sister   . Diabetes Sister   . Hypertension Sister   . Varicose Veins Sister   . Diabetes Brother   . Hypertension Brother    Allergies  Allergen Reactions  . Dulaglutide     Other reaction(s): localized site reaction, dyspepsia  . Insulin  Degludec     Other reaction(s): Itching and swelling in hands  . Gabapentin Other (See Comments)    Ataxia  . Morphine And Codeine Itching       . Naproxen Other (See Comments)    Neurological reaction. Tremors, hallucinates    Current Outpatient Medications  Medication Sig Dispense Refill  . acetaminophen  (TYLENOL ) 325 MG tablet Take 650 mg by mouth every 6 (six) hours as needed for moderate pain or headache.     . aspirin  81 MG tablet Take 1 tablet (81 mg total) by mouth daily.    . atorvastatin  (LIPITOR) 20 MG tablet Take 20 mg by mouth daily.    SABRA augmented betamethasone dipropionate (DIPROLENE-AF) 0.05 % cream as needed for itching.    . chlorhexidine  (PERIDEX ) 0.12 % solution SWISH WITH 1-2 OUNCE FOR 30 SECONDS THEN SPIT TWICE DAILY as needed for flare up  0   . chlorpheniramine-HYDROcodone  (TUSSIONEX) 10-8 MG/5ML SUER as needed for cough.    . clopidogrel  (PLAVIX ) 75 MG tablet Take 1 tablet (75 mg total) by mouth daily. 90 tablet 3  . colchicine  0.6 MG tablet Take 0.6 mg by mouth daily as needed (gout).     . Cyanocobalamin  (B-12) 5000 MCG CAPS Take 5,000 mcg by mouth daily.    . dorzolamide -timolol  (COSOPT ) 22.3-6.8 MG/ML ophthalmic solution Place 1 drop into both eyes 2 (two) times daily.    . empagliflozin (JARDIANCE) 25 MG TABS tablet Take 25 mg by mouth daily.    . HUMALOG  KWIKPEN 100 UNIT/ML KwikPen INJECT 12 UNITS WITH BREAKFAST AND LUNCH, 24 UNITS WITH SUPPER. ADD CORRECTION ISF 20, TARGET 100. MAX DOSE 100 UNITS PER DAY for 30 days    . Insulin  Pen Needle 31G X 8 MM MISC     . isosorbide  mononitrate (IMDUR ) 60 MG 24 hr tablet Take 1 tablet by mouth twice daily 180 tablet 3  . LANTUS  SOLOSTAR 100 UNIT/ML Solostar Pen SMARTSIG:40 Unit(s) SUB-Q Twice Daily    . lidocaine  (LIDODERM ) 5 % Place 1 patch onto the skin daily. Remove & Discard patch within 12 hours or as directed by MD 30 patch 0  . lisinopril  (ZESTRIL ) 5 MG tablet Take 1 tablet (5 mg total) by mouth daily. 90 tablet 2  . LUMIGAN 0.01 % SOLN Place 1 drop into the left eye at bedtime.    . LYRICA  100 MG capsule Take 50 mg by mouth 2 (two) times daily as needed (1 in the AM and 2 in the PM). For neuropathic pain  2  . Melatonin 5 MG TABS Take 5 mg by mouth at bedtime.    . metFORMIN  (GLUCOPHAGE ) 1000 MG tablet Take 1,000 mg  by mouth 2 (two) times daily with a meal.    . metoprolol  tartrate (LOPRESSOR ) 100 MG tablet Take 1 tablet (100 mg total) by mouth 2 (two) times daily. 180 tablet 3  . Multiple Vitamin (MULTI VITAMIN) TABS daily.    . nitroGLYCERIN  (NITROSTAT ) 0.4 MG SL tablet Place 1 tablet (0.4 mg total) under the tongue every 5 (five) minutes as needed for chest pain. 75 tablet 2  . Omega-3 Fatty Acids (FISH OIL PO) Take 520 mg by mouth daily.    . pantoprazole  (PROTONIX ) 40 MG  tablet Take 1 tablet (40 mg total) by mouth 2 (two) times daily. 180 tablet 3  . Probiotic Product (PROBIOTIC PO) Take 1 tablet by mouth daily.    SABRA pyridoxine (B-6) 100 MG tablet Take 100 mg by mouth daily.    . triamcinolone  cream (KENALOG ) 0.1 % Apply 1 application topically daily as needed (lichen planus). With cetaphil     Current Facility-Administered Medications  Medication Dose Route Frequency Provider Last Rate Last Admin  . Hyaluronan (ORTHOVISC) intra-articular injection 30 mg  30 mg Intra-articular Once       . Hyaluronan (ORTHOVISC) intra-articular injection 30 mg  30 mg Intra-articular Once        No results found.  Review of Systems:   A ROS was performed including pertinent positives and negatives as documented in the HPI.  Physical Exam :   Constitutional: NAD and appears stated age Neurological: Alert and oriented Psych: Appropriate affect and cooperative There were no vitals taken for this visit.   Comprehensive Musculoskeletal Exam:    Bilateral crepitus tricompartmental of both knees.  Range of motion is from 0 to 100 degrees with crepitus.  Tricompartmental pain.  Small effusion bilaterally  Imaging:   Xray (4 views right knee, 4 views left knee): There is evidence of chondrocalcinosis with moderate osteoarthritis of bilateral knees    I personally reviewed and interpreted the radiographs.   Assessment:   83 y.o. male seeking bilateral knee injections today.  This was provided after verbal consent was obtained  -Bilateral ultrasound-guided knee injections performed today after verbal consent obtained    Procedure Note  Patient: Seth Young             Date of Birth: 1941/02/16           MRN: [id number]             Visit Date: 03/14/2024  Procedures: Visit Diagnoses:  1. Primary osteoarthritis of both knees      Large Joint Inj: R knee on 03/14/2024 3:39 PM Indications: pain Details: 22 G 1.5 in needle, ultrasound-guided anterior  approach  Arthrogram: No  Medications: 4 mL lidocaine  1 %; 80 mg triamcinolone  acetonide 40 MG/ML Outcome: tolerated well, no immediate complications Procedure, treatment alternatives, risks and benefits explained, specific risks discussed. Consent was given by the patient. Immediately prior to procedure a time out was called to verify the correct patient, procedure, equipment, support staff and site/side marked as required. Patient was prepped and draped in the usual sterile fashion.    Large Joint Inj: L knee on 03/14/2024 3:40 PM Indications: pain Details: 22 G 1.5 in needle, ultrasound-guided anterior approach  Arthrogram: No  Medications: 4 mL lidocaine  1 %; 80 mg triamcinolone  acetonide 40 MG/ML Outcome: tolerated well, no immediate complications Procedure, treatment alternatives, risks and benefits explained, specific risks discussed. Consent was given by the patient. Immediately prior to procedure a time out was called to verify the  correct patient, procedure, equipment, support staff and site/side marked as required. Patient was prepped and draped in the usual sterile fashion.          I personally saw and evaluated the patient, and participated in the management and treatment plan.  Elspeth Parker, MD Attending Physician, Orthopedic Surgery  This document was dictated using Dragon voice recognition software. A reasonable attempt at proof reading has been made to minimize errors.

## 2024-04-04 DIAGNOSIS — E1129 Type 2 diabetes mellitus with other diabetic kidney complication: Secondary | ICD-10-CM | POA: Diagnosis not present

## 2024-04-30 DIAGNOSIS — H401123 Primary open-angle glaucoma, left eye, severe stage: Secondary | ICD-10-CM | POA: Diagnosis not present

## 2024-05-01 ENCOUNTER — Other Ambulatory Visit (HOSPITAL_BASED_OUTPATIENT_CLINIC_OR_DEPARTMENT_OTHER): Payer: Self-pay

## 2024-05-01 MED ORDER — FLUZONE HIGH-DOSE 0.5 ML IM SUSY
0.5000 mL | PREFILLED_SYRINGE | Freq: Once | INTRAMUSCULAR | 0 refills | Status: AC
Start: 2024-05-01 — End: 2024-05-02
  Filled 2024-05-01: qty 0.5, 1d supply, fill #0

## 2024-05-01 MED ORDER — COMIRNATY 30 MCG/0.3ML IM SUSY
0.3000 mL | PREFILLED_SYRINGE | Freq: Once | INTRAMUSCULAR | 0 refills | Status: AC
  Filled 2024-05-01: qty 0.3, 1d supply, fill #0

## 2024-06-07 DIAGNOSIS — E1129 Type 2 diabetes mellitus with other diabetic kidney complication: Secondary | ICD-10-CM | POA: Diagnosis not present

## 2024-06-10 ENCOUNTER — Other Ambulatory Visit: Payer: Self-pay | Admitting: Cardiology

## 2024-06-10 DIAGNOSIS — R079 Chest pain, unspecified: Secondary | ICD-10-CM

## 2024-06-11 ENCOUNTER — Encounter: Payer: Self-pay | Admitting: Radiology

## 2024-06-11 NOTE — Progress Notes (Addendum)
 Cardiology Office Note   Date:  06/13/2024  ID:  Seth Young 1940-11-08, MRN [id number] PCP: Seth Ade, MD  Seth Young Cardiologist:  Seth Parchment, MD     [facility] Carotid artery disease S/p left carotid endarterectomy 2016 Coronary artery disease S/p DES to mLAD 2019 Hypertension Hyperlipidemia Type 2 diabetes  History as outlined above.  He has maintained consistent follow-up.  Last cardiac catheterization 03/2018 revealed diffuse triple-vessel CAD, RCA small caliber nondominant vessel with diffuse severe stenosis in small PDA and RV marginal branch, too small for PCI.  Circumflex has moderate proximal and mid stenosis, obtuse marginal stent is patent without restenosis LAD is large caliber vessel with proximal LAD diffuse moderate disease but no focally obstructive lesions, mid LAD successfully treated with PTCA/DES x 1, small caliber first diagonal with diffuse severe disease not large enough for PCI.  He was placed on aspirin  and Plavix .  Last cardiology clinic visit was 04/28/2023 with Dr. Parchment.  He remained on GDMT for CAD including aspirin , atorvastatin , Imdur , Plavix , and metoprolol .  He reported difficulty managing glucose levels and was working with a home health nurse for better management.  BP was well-controlled at that time.  Carotid duplex 04/29/2023 with mild right carotid stenosis, patent left endarterectomy, stable.   History of Present Illness Discussed the use of AI scribe software for clinical note transcription with the patient, who gave verbal consent to proceed.  History of Present Illness Seth Young is a very pleasant 83 year old male with coronary artery disease who presents for evaluation of limitations in physical activity and shortness of breath. He experiences shortness of breath during physical activities, particularly while walking and climbing stairs. He describes occasional chest discomfort, occurring about three times in  the past six months, which is relieved by nitroglycerin . He denies chest pain that occurs each time he exerts himself and is not having any episodes of presyncope, syncope, palpitations, orthopnea, PND, or edema. He feels well on his medication regimen including metoprolol , isosorbide , aspirin , Plavix , and lisinopril . We reviewed findings from previous catheterizations in 2017 and 2019 in detail. He has a family history of heart disease, with his father having died from a heart attack. He engages in physical activity with his grandson at least 2 days per week, including stationary biking, walking with a walker, weights and stretches. He occasionally notes right foot swelling and has chronic knee and back pain.    ROS: See HPI  Studies Reviewed EKG Interpretation Date/Time:  Wednesday June 13 2024 14:03:42 EST Ventricular Rate:  67 PR Interval:  244 QRS Duration:  90 QT Interval:  394 QTC Calculation: 416 R Axis:   -8  Text Interpretation: Sinus rhythm with sinus arrhythmia with 1st degree A-V block Low voltage QRS Cannot rule out Inferior infarct , age undetermined Cannot rule out Anterior infarct , age undetermined When compared with ECG of 28-Apr-2023 11:00, Minimal criteria for Anterior infarct are now Present No significant change was found Confirmed by Seth Young [phone]) on 06/13/2024 2:40:06 PM     No results found for: LIPOA  Risk Assessment/Calculations           Physical Exam VS:  BP 120/68   Pulse 66   Ht 5' 11.5 (1.816 m)   Wt 249 lb (112.9 kg)   SpO2 97%   BMI 34.24 kg/m    Wt Readings from Last 3 Encounters:  06/13/24 249 lb (112.9 kg)  04/28/23 246 lb (111.6 kg)  04/02/22  246 lb (111.6 kg)    GEN: Well nourished, well developed in no acute distress NECK: No JVD; No carotid bruits CARDIAC: RRR, no murmurs, rubs, gallops RESPIRATORY:  Clear to auscultation without rales, wheezing or rhonchi  ABDOMEN: Soft, non-tender, non-distended EXTREMITIES:  No  edema; No deformity   Assessment & Plan Coronary artery disease  DOE Chest pain He experiences dyspnea on exertion, particularly with climbing stairs. He walks around his home with a walker but has not been traveling far distances due to limitations including knee and back pain. Uses stationary bike and does walking, stretching and weighted exercises with his grandson at least 2 days per week. At least 3 episodes of chest pain relieved by nitroglycerin  over the past 3 months. No orthopnea or PND. Occasionally the toes on his right foot swell. EKG today indicates a possible anterior infarct with subtle changes from last EKG 04/2023. Cardiac cath in 2019 showed a stent in the mid LAD, moderate circumflex disease, and right coronary artery disease too small for stenting.  We discussed possible up-titration of GDMT, however BP is soft. Due to occasional chest pain and DOE, we will get ischemia evaluation given time since last cath and possible worsening stenosis. - Get cardiac PET CT for evaluation of ischemia - Continue metoprolol , isosorbide , aspirin , amlodipine , Plavix , and lisinopril   First degree atrioventricular (AV) block   EKG shows first degree AV block at 67 bpm. Has been present since at least 2019. No lightheadedness, presyncope or syncope. Advised that metoprolol  may lower HR but since he is asymptomatic we will continue to monitor for now.  - Notify us  if symptoms develop  - Continue metoprolol    Hyperlipidemia LDL goal < 55 Diabetes Lipid panel completed 12/19/23 with total cholesterol 111, HDL 35, triglycerides 103, and LDL 55. Lipids are at goal. A1C 6.5% on 12/19/23. He generally eats small meals or fasts until dinner. We discussed heart healthy diet avoiding saturated fat, processed food, sugar and other simple carbohydrates and the importance of avoiding prolonged fasting for glucose stability. Recommend regular walking around at home to avoid prolonged sitting.  - Management of  diabetes per PCP - Continue atorvastatin  - Continue to be physically active as often as possible throughout the day along with moderate intensity exercise for goal of 150 minutes each week - Heart healthy diet   Lower extremity osteoarthritis   Right foot swelling  Peripheral neuropathy He reports limitations in activity, particularly long distance travel due to knee and back pain. He uses compression socks during workouts. He engages in exercise at least 2 days per week with his grandson. Uses Lyrica  and Tylenol  for pain associated with peripheral neuropathy. Notes toes swelling occasionally on the right foot. No orthopnea or PND associated.  - Recommend continued compression and medication for management - Maintain consistent physical activity to avoid prolonged periods of sitting - Maintain consistent follow-up and management of diabetes      Informed Consent   Shared Decision Making/Informed Consent The risks [chest pain, shortness of breath, cardiac arrhythmias, dizziness, blood pressure fluctuations, myocardial infarction, stroke/transient ischemic attack, nausea, vomiting, allergic reaction, radiation exposure, metallic taste sensation and life-threatening complications (estimated to be 1 in 10,000)], benefits (risk stratification, diagnosing coronary artery disease, treatment guidance) and alternatives of a cardiac PET stress test were discussed in detail with Mr. Reppond and he agrees to proceed.     Disposition: 3 months with me  Signed, Rosaline Bane, NP-C  Patient seen and examined. Reviewed all prior studies including  prior cardiac cath and recent stress test findings. Agree that cardiac cath indicated with potential need for PCI. Discussed fully with patient   Peter Jordan MD, FACC 07/13/2024 7:10 AM

## 2024-06-13 ENCOUNTER — Ambulatory Visit (HOSPITAL_BASED_OUTPATIENT_CLINIC_OR_DEPARTMENT_OTHER): Admitting: Nurse Practitioner

## 2024-06-13 ENCOUNTER — Encounter (HOSPITAL_BASED_OUTPATIENT_CLINIC_OR_DEPARTMENT_OTHER): Payer: Self-pay | Admitting: Nurse Practitioner

## 2024-06-13 VITALS — BP 120/68 | HR 66 | Ht 71.5 in | Wt 249.0 lb

## 2024-06-13 DIAGNOSIS — E1159 Type 2 diabetes mellitus with other circulatory complications: Secondary | ICD-10-CM

## 2024-06-13 DIAGNOSIS — I2510 Atherosclerotic heart disease of native coronary artery without angina pectoris: Secondary | ICD-10-CM | POA: Diagnosis not present

## 2024-06-13 DIAGNOSIS — R0609 Other forms of dyspnea: Secondary | ICD-10-CM | POA: Diagnosis not present

## 2024-06-13 DIAGNOSIS — M7989 Other specified soft tissue disorders: Secondary | ICD-10-CM

## 2024-06-13 DIAGNOSIS — I25118 Atherosclerotic heart disease of native coronary artery with other forms of angina pectoris: Secondary | ICD-10-CM

## 2024-06-13 DIAGNOSIS — M159 Polyosteoarthritis, unspecified: Secondary | ICD-10-CM

## 2024-06-13 DIAGNOSIS — Z794 Long term (current) use of insulin: Secondary | ICD-10-CM

## 2024-06-13 DIAGNOSIS — E785 Hyperlipidemia, unspecified: Secondary | ICD-10-CM | POA: Diagnosis not present

## 2024-06-13 DIAGNOSIS — I440 Atrioventricular block, first degree: Secondary | ICD-10-CM

## 2024-06-13 DIAGNOSIS — R079 Chest pain, unspecified: Secondary | ICD-10-CM

## 2024-06-13 NOTE — Patient Instructions (Signed)
 Medication Instructions:   Your physician recommends that you continue on your current medications as directed. Please refer to the Current Medication list given to you today.   *If you need a refill on your cardiac medications before your next appointment, please call your pharmacy*  Lab Work:  None ordered.  If you have labs (blood work) drawn today and your tests are completely normal, you will receive your results only by: MyChart Message (if you have MyChart) OR A paper copy in the mail If you have any lab test that is abnormal or we need to change your treatment, we will call you to review the results.  Testing/Procedures:     Please report to Radiology at the [facility] Main Entrance 30 minutes early for your test.  [address] [place], [place] [address]    How to Prepare for Your Cardiac PET/CT Stress Test:  Nothing to eat or drink, except water, 3 hours prior to arrival time.  NO caffeine/decaffeinated products, or chocolate 12 hours prior to arrival. (Please note decaffeinated beverages (teas/coffees) still contain caffeine).  If you have caffeine within 12 hours prior, the test will need to be rescheduled.  Medication instructions: Do not take erectile dysfunction medications for 72 hours prior to test (sildenafil, tadalafil) Do not take nitrates (isosorbide  mononitrate, Ranexa ) the day before or day of test   Diabetic Preparation: If able to eat breakfast prior to 3 hour fasting, you may take all medications, including your insulin . Do not worry if you miss your breakfast dose of insulin  - start at your next meal. If you do not eat prior to 3 hour fast-Hold all diabetes (oral and insulin ) medications. Patients who wear a continuous glucose monitor MUST remove the device prior to scanning.  You may take your remaining medications with water.  NO perfume, cologne or lotion on chest or abdomen area.  Total time is 1 to 2 hours; you may want to  bring reading material for the waiting time.   In preparation for your appointment, medication and supplies will be purchased.  Appointment availability is limited, so if you need to cancel or reschedule, please call the Radiology Department Scheduler at [phone] 24 hours in advance to avoid a cancellation fee of $100.00  What to Expect When you Arrive:  Once you arrive and check in for your appointment, you will be taken to a preparation room within the Radiology Department.  A technologist or Nurse will obtain your medical history, verify that you are correctly prepped for the exam, and explain the procedure.  Afterwards, an IV will be started in your arm and electrodes will be placed on your skin for EKG monitoring during the stress portion of the exam. Then you will be escorted to the PET/CT scanner.  There, staff will get you positioned on the scanner and obtain a blood pressure and EKG.  During the exam, you will continue to be connected to the EKG and blood pressure machines.  A small, safe amount of a radioactive tracer will be injected in your IV to obtain a series of pictures of your heart along with an injection of a stress agent.    After your Exam:  It is recommended that you eat a meal and drink a caffeinated beverage to counter act any effects of the stress agent.  Drink plenty of fluids for the remainder of the day and urinate frequently for the first couple of hours after the exam.  Your doctor will  inform you of your test results within 7-10 business days.  For more information and frequently asked questions, please visit our website: [email or web address]  For questions about your test or how to prepare for your test, please call: Cardiac Imaging Nurse Navigators Office: [phone]   Follow-Up: At [facility], you and your health needs are our priority.  As part of our continuing mission to provide you with exceptional heart care, our providers are  all part of one team.  This team includes your primary Cardiologist (physician) and Advanced Practice Providers or APPs (Physician Assistants and Nurse Practitioners) who all work together to provide you with the care you need, when you need it.  Your next appointment:   3 month(s)  Provider:   Rosaline Bane, NP    We recommend signing up for the patient portal called MyChart.  Sign up information is provided on this After Visit Summary.  MyChart is used to connect with patients for Virtual Visits (Telemedicine).  Patients are able to view lab/test results, encounter notes, upcoming appointments, etc.  Non-urgent messages can be sent to your provider as well.   To learn more about what you can do with MyChart, go to [email or web address].

## 2024-06-22 ENCOUNTER — Other Ambulatory Visit: Payer: Self-pay | Admitting: Cardiology

## 2024-06-22 DIAGNOSIS — R079 Chest pain, unspecified: Secondary | ICD-10-CM

## 2024-06-26 ENCOUNTER — Encounter (HOSPITAL_COMMUNITY): Payer: Self-pay

## 2024-06-27 ENCOUNTER — Other Ambulatory Visit (HOSPITAL_COMMUNITY): Payer: Self-pay | Admitting: Nurse Practitioner

## 2024-06-27 ENCOUNTER — Encounter (HOSPITAL_COMMUNITY)
Admission: RE | Admit: 2024-06-27 | Discharge: 2024-06-27 | Disposition: A | Discharge status: Home / Self Care | Source: Ambulatory Visit | Attending: Nurse Practitioner | Admitting: Nurse Practitioner

## 2024-06-27 DIAGNOSIS — R0609 Other forms of dyspnea: Secondary | ICD-10-CM | POA: Insufficient documentation

## 2024-06-27 DIAGNOSIS — I25118 Atherosclerotic heart disease of native coronary artery with other forms of angina pectoris: Secondary | ICD-10-CM | POA: Insufficient documentation

## 2024-06-27 LAB — NM PET CT CARDIAC PERFUSION MULTI W/ABSOLUTE BLOODFLOW
Nuc Rest EF: 54 %
Nuc Stress EF: 51 %
Rest Nuclear Isotope Dose: 26.1 mCi
ST Depression (mm): 0 mm
Stress Nuclear Isotope Dose: 26.1 mCi
TID: 1.12

## 2024-06-27 MED ORDER — RUBIDIUM RB82 GENERATOR (RUBYFILL)
25.4600 | PACK | Freq: Once | INTRAVENOUS | Status: AC
  Administered 2024-06-27: 25.46 via INTRAVENOUS

## 2024-06-27 MED ORDER — RUBIDIUM RB82 GENERATOR (RUBYFILL)
26.0600 | PACK | Freq: Once | INTRAVENOUS | Status: AC
  Administered 2024-06-27: 26.06 via INTRAVENOUS

## 2024-06-27 MED ORDER — REGADENOSON 0.4 MG/5ML IV SOLN
INTRAVENOUS | Status: AC
  Filled 2024-06-27: qty 5

## 2024-06-27 MED ORDER — REGADENOSON 0.4 MG/5ML IV SOLN
0.4000 mg | Freq: Once | INTRAVENOUS | Status: AC
  Administered 2024-06-27: 0.4 mg via INTRAVENOUS

## 2024-06-28 ENCOUNTER — Ambulatory Visit (HOSPITAL_BASED_OUTPATIENT_CLINIC_OR_DEPARTMENT_OTHER): Payer: Self-pay

## 2024-06-28 DIAGNOSIS — I2510 Atherosclerotic heart disease of native coronary artery without angina pectoris: Secondary | ICD-10-CM

## 2024-06-28 DIAGNOSIS — I779 Disorder of arteries and arterioles, unspecified: Secondary | ICD-10-CM

## 2024-06-28 DIAGNOSIS — I25118 Atherosclerotic heart disease of native coronary artery with other forms of angina pectoris: Secondary | ICD-10-CM

## 2024-06-28 DIAGNOSIS — Z01812 Encounter for preprocedural laboratory examination: Secondary | ICD-10-CM

## 2024-06-28 NOTE — Telephone Encounter (Signed)
 Seen by Rosaline Bane, NP 06/13/2024.  He has a history of CAD s/p DES/M LAD in 2019.  Small caliber first diagonal with diffuse severe disease was not large enough for PCI.  He had exertional dyspnea as well as 3 episodes of chest pain resolved on nitroglycerin .  Cardiac PET with reversible apical to mid inferior stress perfusion defect consistent with ischemia. This was a medium defect with mild reduction in apical to basal anterior and apex locations (LAD territory). Additionally drop in EF from 54% ? 51% with stress.   Diabetic agents include: Insulin  - jardiance.  Reviewed results via phone. Reports shortness of breath but no chest pain. He has taken nitroglycerin  3x over the last 6 months. Dicussed dyspnea can be anginal equivalent.  He verbalized understanding, thanked me for call, agreeable for LHC.   Prefers LHC (code [id number]) 07/11/24 with Dr. Verlin.  Will route to nursing team for assistance with scheduling: Hold Jardiance 3 days prior Hold Lisinopril , Metformin , all insulin  morning of procedure Take HALF of Lantus  Solostar (insulin  glargine) dose the night before. He may take his usual dose of Humalog  Kwikpen with dinner the night before. No insulin  (short nor acting) the morning of procedure.  Update BMET/CBC this week or early next week for pre-procedure labs Take Plavix  and Aspirin  morning of LHC  Informed Consent   Shared Decision Making/Informed Consent The risks [stroke (1 in 1000), death (1 in 1000), kidney failure [usually temporary] (1 in 500), bleeding (1 in 200), allergic reaction [possibly serious] (1 in 200)], benefits (diagnostic support and management of coronary artery disease) and alternatives of a cardiac catheterization were discussed in detail with Seth Young and he is willing to proceed.     Seth Sciuto S Carmesha Morocco, NP

## 2024-06-28 NOTE — Telephone Encounter (Signed)
 Pt aware that LHC is scheduled for Wed 12/3 at 0830 (arrive at 0630) with Dr. Jordan to do.  Unfortunately, Dr. Verlin is full that day, but pt was agreeable to have Dr. Jordan perform his cath instead.   He will come for pre-procedure labs on next Monday 11/24 at [facility] lab. BMET and CBC W DIFF placed and released.   Pt aware I will send him the heart cath instructions to his mychart to refer to when preparing for his procedure.  Did go over instructions as well with him on the phone.   Scheduled him for post cath follow-up appt with Rosaline Bane, NP on Monday 12/15 at 1:55 pm [facility] location.  He is aware to arrive 15 mins prior to this appt.   Pt verbalized understanding and agrees with this plan.     Will send pre-cert a message to pre-cert cath for 12/3 with Dr. Jordan at 0830, as well as Reche Finder, NP a message to place hospital orders for then.

## 2024-06-28 NOTE — Telephone Encounter (Signed)
     Cardiac/Peripheral Catheterization   You are scheduled for a Cardiac Catheterization on Wednesday, December 3 with Dr. Peter Jordan.  1. Please arrive at the [facility] (Main Entrance A) at [facility]: [address] [place], [place] [address] at 6:30 AM (This time is 2 hour(s) before your procedure to ensure your preparation).   Free valet parking service is available. You will check in at ADMITTING. The support person will be asked to wait in the waiting room.  It is OK to have someone drop you off and come back when you are ready to be discharged.        Special note: Every effort is made to have your procedure done on time. Please understand that emergencies sometimes delay scheduled procedures.  2. Diet: Nothing to eat after midnight.  3. Hydration:You need to be well hydrated before your procedure. On December 3, you may drink approved liquids (see below) until 2 hours before the procedure, with 16 oz of water as your last intake.   List of approved liquids water, clear juice, clear tea, black coffee, fruit juices, non-citric and without pulp, carbonated beverages, Gatorade, Kool -Aid, plain Jello-O and plain ice popsicles.  4. Labs: You will need to have blood drawn on Monday, November 24 at [facility]:  [address] [place], [place] [address] (lab is in the Primary Care office located on the 3rd floor).  Hours: 8:00 am - 4:30 pm (avoid 12:00 pm - 1:00 pm)  Phone: [phone].  Tell staff you are there for blood work and they will direct you to the lab.  You do not need an appointment. You do not need to be fasting.  5. Medication instructions in preparation for your procedure:   Contrast Allergy: No   Stop taking, JARDIANCE 3 DAYS PRIOR TO CATH HOLD ON  Sunday, November 30,  HOLD LISINOPRIL , METFORMIN , AND ALL INSULINS THE MORNING OF YOUR CATH ON 07/11/24  Take only 20 units of insulin  the night before your procedure.  Do not take any insulin  on the day of the procedure.  THIS WOULD BE YOUR LANTUS  SOLOSTAR (INSULIN  GLARGINE) --YOU MAY TAKE YOUR USUAL DOSE OF HUMALOG  KWIKPEN WITH DINNER NIGHT BEFORE CATH.  Do not take Diabetes Med Glucophage  (Metformin ) on the day of the procedure and HOLD 48 HOURS AFTER THE PROCEDURE.  On the morning of your procedure, take Aspirin  81 mg and Plavix /Clopidogrel  and any morning medicines NOT listed above.  You may use sips of water.  6. Plan to go home the same day, you will only stay overnight if medically necessary. 7. You MUST have a responsible adult to drive you home. 8. An adult MUST be with you the first 24 hours after you arrive home. 9. Bring a current list of your medications, and the last time and date medication taken. 10. Bring ID and current insurance cards. 11.Please wear clothes that are easy to get on and off and wear slip-on shoes.  Thank you for allowing us  to care for you!   -- [facility]

## 2024-06-29 ENCOUNTER — Telehealth: Payer: Self-pay

## 2024-06-29 ENCOUNTER — Telehealth (HOSPITAL_BASED_OUTPATIENT_CLINIC_OR_DEPARTMENT_OTHER): Payer: Self-pay | Admitting: Orthopaedic Surgery

## 2024-06-29 ENCOUNTER — Ambulatory Visit (HOSPITAL_BASED_OUTPATIENT_CLINIC_OR_DEPARTMENT_OTHER): Admitting: Student

## 2024-06-29 DIAGNOSIS — M25561 Pain in right knee: Secondary | ICD-10-CM

## 2024-06-29 DIAGNOSIS — M25562 Pain in left knee: Secondary | ICD-10-CM | POA: Diagnosis not present

## 2024-06-29 DIAGNOSIS — M170 Bilateral primary osteoarthritis of knee: Secondary | ICD-10-CM | POA: Diagnosis not present

## 2024-06-29 MED ORDER — LIDOCAINE HCL 1 % IJ SOLN
4.0000 mL | INTRAMUSCULAR | Status: AC | PRN
  Administered 2024-06-29: 4 mL

## 2024-06-29 MED ORDER — TRIAMCINOLONE ACETONIDE 40 MG/ML IJ SUSP
2.0000 mL | INTRAMUSCULAR | Status: AC | PRN
  Administered 2024-06-29: 2 mL via INTRA_ARTICULAR

## 2024-06-29 NOTE — Progress Notes (Signed)
 Chief Complaint: Bilateral knee pain     History of Present Illness:    Seth Young is a 83 y.o. male with known bilateral knee osteoarthritis presenting today with worsening right more greater than left knee pain.  Patient reports that over the last few days, his right knee has become increasingly painful and swollen without any known injury or cause.  This has made ambulation and weightbearing difficult.  He is set to undergo a heart catheterization on 12/3.  Patient does also note prior history of pseudogout.  He denies any redness, fever, or chills.   Surgical History:   None  PMH/PSH/Family History/Social History/Meds/Allergies:    Past Medical History:  Diagnosis Date   Arthritis    knees, toes, hands, probably in my back (03/09/2018)   Ataxia    Basal cell carcinoma    left temporal area-no residual   Colon polyps 10-12-11   past hx.   Coronary artery disease    Exertional angina 06/26/2014   Cath with DES to the OM, Bioflow protocol    GERD (gastroesophageal reflux disease)    H/O hiatal hernia    no problems   High cholesterol    History of echocardiogram    Echo 7/19:  Mild LVH, mild focal basal septal hypertrophy, EF 60-65, no RWMA, Gr 1 DD, trivial AI, MAC   Hypertension    Ischemic chest pain 08/07/2014   Occasional tremors    Bilateral hands   Peripheral neuropathy    Pneumonia 1940's X 2; ~ 2017   infant; walking pneumonia (03/09/2018)   Pseudogout    it moves around   Sleep apnea    no cpap used   Type II diabetes mellitus (HCC) dx'd 1982   nsulin started ~ 2000   Vertigo    Past Surgical History:  Procedure Laterality Date   BACK SURGERY     BASAL CELL CARCINOMA EXCISION Left ~ 2012   face   CARDIAC CATHETERIZATION  02/2005   Dr. Dominick; negative.   CARDIAC CATHETERIZATION N/A 05/13/2016   Procedure: Left Heart Cath and Coronary Angiography;  Surgeon: Oneil JAYSON Parchment, MD;  Location: [facility] INVASIVE CV LAB;   Service: Cardiovascular;  Laterality: N/A;   CARPAL TUNNEL RELEASE Left 10/2009   CARPAL TUNNEL RELEASE Right ~ 2013   CATARACT EXTRACTION W/ INTRAOCULAR LENS IMPLANT Left 2000's   CATARACT EXTRACTION W/ INTRAOCULAR LENS IMPLANT Right 2015   COLONOSCOPY W/ POLYPECTOMY     CORONARY ANGIOPLASTY WITH STENT PLACEMENT  06/26/2014   1   CORONARY ANGIOPLASTY WITH STENT PLACEMENT  06/26/2014   pLAD 50%, d LAD 60%, oD1 80%,  mD1  70%, CFX 50%, OM 2 70%,  RCA 80%, PDA 95% (<60mm), OM1 99%-0% with Bio flow stent        CORONARY ANGIOPLASTY WITH STENT PLACEMENT  03/09/2018   CORONARY STENT INTERVENTION N/A 03/09/2018   Procedure: CORONARY STENT INTERVENTION;  Surgeon: Verlin Lonni BIRCH, MD;  Location: [facility];  Service: Cardiovascular;  Laterality: N/A;   ELBOW SURGERY Bilateral ~ 2014   for blockages; Dr. Camella   ENDARTERECTOMY Left 04/23/2015   Procedure: ENDARTERECTOMY CAROTID;  Surgeon: Redell LITTIE Door, MD;  Location: [facility] OR;  Service: Vascular;  Laterality: Left;   EYE SURGERY Left    related go diabetes; laser  KNEE ARTHROSCOPY Left 05/2011   LEFT HEART CATH AND CORONARY ANGIOGRAPHY N/A 03/09/2018   Procedure: LEFT HEART CATH AND CORONARY ANGIOGRAPHY;  Surgeon: Verlin Lonni BIRCH, MD;  Location: [facility];  Service: Cardiovascular;  Laterality: N/A;   LEFT HEART CATHETERIZATION WITH CORONARY ANGIOGRAM N/A 06/26/2014   Procedure: LEFT HEART CATHETERIZATION WITH CORONARY ANGIOGRAM;  Surgeon: Ozell BIRCH Fell, MD;  Location: [facility] CATH LAB;  Service: Cardiovascular;  Laterality: N/A;   LEFT HEART CATHETERIZATION WITH CORONARY ANGIOGRAM N/A 08/07/2014   Procedure: LEFT HEART CATHETERIZATION WITH CORONARY ANGIOGRAM;  Surgeon: Ozell BIRCH Fell, MD;  Location: [facility] CATH LAB;  Service: Cardiovascular;  Laterality: N/A;   LUMBAR LAMINECTOMY/DECOMPRESSION MICRODISCECTOMY Left 11/02/2017   Procedure: MICRODISCECTOMY LUMBAR THREE- LUMBAR FOUR, LEFT;  Surgeon: Gillie Duncans, MD;   Location: [facility] OR;  Service: Neurosurgery;  Laterality: Left;   NASAL SEPTUM SURGERY  1979   PATCH ANGIOPLASTY Left 04/23/2015   Procedure: PATCH ANGIOPLASTY USING 1CM X 6CM XENOSURE BIOLOGIC PATCH;  Surgeon: Redell LITTIE Door, MD;  Location: [facility] OR;  Service: Vascular;  Laterality: Left;   POSTERIOR LUMBAR FUSION  1964   SHOULDER OPEN ROTATOR CUFF REPAIR  10/14/2011   Procedure: ROTATOR CUFF REPAIR SHOULDER OPEN;  Surgeon: Reyes JAYSON Billing, MD;  Location: [facility] ORS;  Service: Orthopedics;  Laterality: Right;   SUBACROMIAL DECOMPRESSION  10/14/2011   Procedure: SUBACROMIAL DECOMPRESSION;  Surgeon: Reyes JAYSON Billing, MD;  Location: [facility] ORS;  Service: Orthopedics;  Laterality: Right;   TRIGGER FINGER RELEASE Right ~ 2013-2014   3rd & 4th digits   Social History   Socioeconomic History   Marital status: Married    Spouse name: Not on file   Number of children: 5   Years of education: College   Highest education level: Not on file  Occupational History   Not on file  Tobacco Use   Smoking status: Former    Current packs/day: 0.00    Average packs/day: 0.5 packs/day for 20.0 years (10.0 ttl pk-yrs)    Types: Cigarettes    Start date: 10/11/1968    Quit date: 10/11/1988    Years since quitting: 35.7   Smokeless tobacco: Never  Vaping Use   Vaping status: Never Used  Substance and Sexual Activity   Alcohol  use: No    Alcohol /week: 0.0 standard drinks of alcohol     Comment: Quit drinking in 1990   Drug use: No   Sexual activity: Not Currently  Other Topics Concern   Not on file  Social History Narrative   Drinks about 2 cups of coffee a day, occasional diet pepsi.    Social Drivers of Corporate Investment Banker Strain: Not on file  Food Insecurity: Low Risk  (11/28/2023)   Received from [facility]   Hunger Vital Sign    Within the past 12 months, you worried that your food would run out before you got money to buy more: Never true    Within the past 12 months, the food you bought just didn't  last and you didn't have money to get more. : Never true  Transportation Needs: No Transportation Needs (11/28/2023)   Received from [facility]    In the past 12 months, has lack of reliable transportation kept you from medical appointments, meetings, work or from getting things needed for daily living? : No  Physical Activity: Not on file  Stress: Not on file  Social Connections: Not on file   Family History  Problem Relation  Age of Onset   Diabetes Mother    Varicose Veins Mother    Heart attack Father 50       massive heart attack   Cancer Sister    Diabetes Sister    Hypertension Sister    Varicose Veins Sister    Diabetes Brother    Hypertension Brother    Allergies  Allergen Reactions   Dulaglutide     Other reaction(s): localized site reaction, dyspepsia   Insulin  Degludec     Other reaction(s): Itching and swelling in hands   Gabapentin Other (See Comments)    Ataxia   Morphine And Codeine Itching        Naproxen Other (See Comments)    Neurological reaction. Tremors, hallucinates    Current Outpatient Medications  Medication Sig Dispense Refill   acetaminophen  (TYLENOL ) 325 MG tablet Take 650 mg by mouth every 6 (six) hours as needed for moderate pain or headache.      amLODipine  (NORVASC ) 5 MG tablet Take 5 mg by mouth.     aspirin  81 MG tablet Take 1 tablet (81 mg total) by mouth daily.     atorvastatin  (LIPITOR) 20 MG tablet Take 20 mg by mouth daily.     augmented betamethasone dipropionate (DIPROLENE-AF) 0.05 % cream as needed for itching.     chlorhexidine  (PERIDEX ) 0.12 % solution SWISH WITH 1-2 OUNCE FOR 30 SECONDS THEN SPIT TWICE DAILY as needed for flare up  0   chlorpheniramine-HYDROcodone  (TUSSIONEX) 10-8 MG/5ML SUER as needed for cough.     clopidogrel  (PLAVIX ) 75 MG tablet Take 1 tablet by mouth once daily 90 tablet 1   colchicine  0.6 MG tablet Take 0.6 mg by mouth daily as needed (gout).      Cyanocobalamin  (B-12) 5000 MCG  CAPS Take 5,000 mcg by mouth daily.     dorzolamide -timolol  (COSOPT ) 22.3-6.8 MG/ML ophthalmic solution Place 1 drop into both eyes 2 (two) times daily.     empagliflozin (JARDIANCE) 25 MG TABS tablet Take 25 mg by mouth daily.     HUMALOG  KWIKPEN 100 UNIT/ML KwikPen INJECT 12 UNITS WITH BREAKFAST AND LUNCH, 24 UNITS WITH SUPPER. ADD CORRECTION ISF 20, TARGET 100. MAX DOSE 100 UNITS PER DAY for 30 days     HYDROcodone -acetaminophen  (NORCO) 7.5-325 MG tablet 1 tablet Oral every 6 hours as needed for pain; Duration: 30 days     Insulin  Pen Needle 31G X 8 MM MISC      isosorbide  mononitrate (IMDUR ) 60 MG 24 hr tablet Take 1 tablet by mouth twice daily 180 tablet 1   LANTUS  SOLOSTAR 100 UNIT/ML Solostar Pen SMARTSIG:40 Unit(s) SUB-Q Twice Daily     lidocaine  (LIDODERM ) 5 % Place 1 patch onto the skin daily. Remove & Discard patch within 12 hours or as directed by MD 30 patch 0   lisinopril  (ZESTRIL ) 5 MG tablet Take 1 tablet by mouth once daily 90 tablet 1   LUMIGAN 0.01 % SOLN Place 1 drop into the left eye at bedtime.     LYRICA  100 MG capsule Take 50 mg by mouth 2 (two) times daily as needed (1 in the AM and 2 in the PM). For neuropathic pain  2   Melatonin 5 MG TABS Take 5 mg by mouth at bedtime.     metFORMIN  (GLUCOPHAGE ) 1000 MG tablet Take 1,000 mg by mouth 2 (two) times daily with a meal.     metoprolol  tartrate (LOPRESSOR ) 100 MG tablet Take 1 tablet by mouth twice daily  180 tablet 1   Multiple Vitamin (MULTI VITAMIN) TABS daily.     nitroGLYCERIN  (NITROSTAT ) 0.4 MG SL tablet Place 1 tablet (0.4 mg total) under the tongue every 5 (five) minutes as needed for chest pain. 75 tablet 2   Omega-3 Fatty Acids (FISH OIL PO) Take 520 mg by mouth daily.     pantoprazole  (PROTONIX ) 40 MG tablet Take 1 tablet by mouth twice daily 180 tablet 1   Probiotic Product (PROBIOTIC PO) Take 1 tablet by mouth daily.     pyridoxine (B-6) 100 MG tablet Take 100 mg by mouth daily.     triamcinolone  cream (KENALOG )  0.1 % Apply 1 application topically daily as needed (lichen planus). With cetaphil     Current Facility-Administered Medications  Medication Dose Route Frequency Provider Last Rate Last Admin   Hyaluronan (ORTHOVISC) intra-articular injection 30 mg  30 mg Intra-articular Once        Hyaluronan (ORTHOVISC) intra-articular injection 30 mg  30 mg Intra-articular Once        No results found.  Review of Systems:   A ROS was performed including pertinent positives and negatives as documented in the HPI.  Physical Exam :   Constitutional: NAD and appears stated age Neurological: Alert and oriented Psych: Appropriate affect and cooperative There were no vitals taken for this visit.   Comprehensive Musculoskeletal Exam:    Exam of the right knee demonstrates presence of a moderate effusion without overlying erythema or warmth.  Tenderness along bilateral joint lines.  Active range of motion from 0 to 90 degrees.  Stable collaterals with varus and valgus stress.  No significant effusion of the left knee which has tenderness along bilateral joint lines.  Left knee range of motion from 0 to 110 degrees.  Imaging:     Assessment:   83 y.o. male with history of bilateral knee osteoarthritis and chondrocalcinosis presenting with right worse than left knee pain.  On exam today his right knee does have a significant effusion which began over the past few days.  I have recommended aspiration and injection for this which patient will like to proceed.  65 mL of blood-tinged fluid was aspirated successfully following injection.  I do have suspicion that this is more likely related to a pseudogout flare.  His left knee is bothersome as well which is typically the knee that causes him more problems.  Injection was also performed in the left knee today.  Will plan to have her follow back up as needed.    -Bilateral knee injection with right knee aspiration performed today    Procedure Note  Patient:  Seth Young             Date of Birth: 21-Aug-1940           MRN: [id number]             Visit Date: 06/29/2024  Procedures: Visit Diagnoses:  1. Primary osteoarthritis of both knees      Large Joint Inj: R knee on 06/29/2024 12:52 PM Indications: pain and joint swelling Details: 18 G 1.5 in needle, superolateral approach Medications: 4 mL lidocaine  1 %; 2 mL triamcinolone  acetonide 40 MG/ML Aspirate: 65 mL blood-tinged Outcome: tolerated well, no immediate complications Procedure, treatment alternatives, risks and benefits explained, specific risks discussed. Consent was given by the patient. Immediately prior to procedure a time out was called to verify the correct patient, procedure, equipment, support staff and site/side marked as required. Patient was prepped and  draped in the usual sterile fashion.    Large Joint Inj: L knee on 06/29/2024 12:54 PM Indications: pain Details: 22 G 1.5 in needle, anterolateral approach Medications: 4 mL lidocaine  1 %; 2 mL triamcinolone  acetonide 40 MG/ML Outcome: tolerated well, no immediate complications Procedure, treatment alternatives, risks and benefits explained, specific risks discussed. Consent was given by the patient. Immediately prior to procedure a time out was called to verify the correct patient, procedure, equipment, support staff and site/side marked as required. Patient was prepped and draped in the usual sterile fashion.       I personally saw and evaluated the patient, and participated in the management and treatment plan.  Leonce Reveal, PA-C Orthopedics  This document was dictated using Conservation officer, historic buildings. A reasonable attempt at proof reading has been made to minimize errors.

## 2024-06-29 NOTE — Telephone Encounter (Signed)
 Patient states he has fluid in his knee and can hardly stand is ok for him to Leonce if is ok with patient

## 2024-06-29 NOTE — Telephone Encounter (Signed)
 Spoke to patient advised his cardiac cath has been rescheduled at [facility] hospital to 12/5 arrive at 5:30 am.

## 2024-06-30 ENCOUNTER — Other Ambulatory Visit (HOSPITAL_BASED_OUTPATIENT_CLINIC_OR_DEPARTMENT_OTHER): Payer: Self-pay | Admitting: Family

## 2024-07-02 ENCOUNTER — Telehealth: Payer: Self-pay | Admitting: Cardiology

## 2024-07-02 NOTE — Telephone Encounter (Signed)
 Spoke to patient advised his cardiac cath has been rescheduled at [facility] hospital to 12/5 arrive at 5:30 am.      Advised pt of the above information and that all other instructions would be the same as previously advised.     1. Please arrive at the [facility] (Main Entrance A) at [facility]: [address] [place], [place] [address] at 5:30 AM (This time is 2 hour(s) before your procedure to ensure your preparation).    Free valet parking service is available. You will check in at ADMITTING. The support person will be asked to wait in the waiting room.  It is OK to have someone drop you off and come back when you are ready to be discharged.         Special note: Every effort is made to have your procedure done on time. Please understand that emergencies sometimes delay scheduled procedures.   2. Diet: Nothing to eat after midnight.   3. Hydration:You need to be well hydrated before your procedure. On December 3, you may drink approved liquids (see below) until 2 hours before the procedure, with 16 oz of water as your last intake.    List of approved liquids water, clear juice, clear tea, black coffee, fruit juices, non-citric and without pulp, carbonated beverages, Gatorade, Kool -Aid, plain Jello-O and plain ice popsicles.   4. Labs: You will need to have blood drawn on Monday, November 24 at [facility]:  [address] [place], [place] [address] (lab is in the Primary Care office located on the 3rd floor).  Hours: 8:00 am - 4:30 pm (avoid 12:00 pm - 1:00 pm)  Phone: [phone].  Tell staff you are there for blood work and they will direct you to the lab.  You do not need an appointment. You do not need to be fasting.   5. Medication instructions in preparation for your procedure:    Contrast Allergy: No     Stop taking, JARDIANCE 3 DAYS PRIOR TO CATH HOLD    HOLD LISINOPRIL , METFORMIN , AND ALL INSULINS THE MORNING OF YOUR CATH ON  07/13/24   Take only 20 units of insulin  the night before your procedure. Do not take any insulin  on the day of the procedure.  THIS WOULD BE YOUR LANTUS  SOLOSTAR (INSULIN  GLARGINE) --YOU MAY TAKE YOUR USUAL DOSE OF HUMALOG  KWIKPEN WITH DINNER NIGHT BEFORE CATH.   Do not take Diabetes Med Glucophage  (Metformin ) on the day of the procedure and HOLD 48 HOURS AFTER THE PROCEDURE.   On the morning of your procedure, take Aspirin  81 mg and Plavix /Clopidogrel  and any morning medicines NOT listed above.  You may use sips of water.   6. Plan to go home the same day, you will only stay overnight if medically necessary. 7. You MUST have a responsible adult to drive you home. 8. An adult MUST be with you the first 24 hours after you arrive home. 9. Bring a current list of your medications, and the last time and date medication taken. 10. Bring ID and current insurance cards. 11.Please wear clothes that are easy to get on and off and wear slip-on shoes.   Thank you for allowing us  to care for you!   -- [facility]

## 2024-07-02 NOTE — Telephone Encounter (Signed)
 Pt called in asking if someone can send him mychart instructions for cath since it was r/s to 12/5.

## 2024-07-09 ENCOUNTER — Telehealth: Payer: Self-pay | Admitting: *Deleted

## 2024-07-09 DIAGNOSIS — I25118 Atherosclerotic heart disease of native coronary artery with other forms of angina pectoris: Secondary | ICD-10-CM | POA: Diagnosis not present

## 2024-07-09 DIAGNOSIS — I2510 Atherosclerotic heart disease of native coronary artery without angina pectoris: Secondary | ICD-10-CM | POA: Diagnosis not present

## 2024-07-09 DIAGNOSIS — Z01812 Encounter for preprocedural laboratory examination: Secondary | ICD-10-CM | POA: Diagnosis not present

## 2024-07-09 DIAGNOSIS — I779 Disorder of arteries and arterioles, unspecified: Secondary | ICD-10-CM | POA: Diagnosis not present

## 2024-07-09 LAB — CBC WITH DIFFERENTIAL/PLATELET
Basophils Absolute: 0.1 x10E3/uL (ref 0.0–0.2)
Basos: 1 %
EOS (ABSOLUTE): 0.3 x10E3/uL (ref 0.0–0.4)
Eos: 2 %
Hematocrit: 55.8 % — ABNORMAL HIGH (ref 37.5–51.0)
Hemoglobin: 17.6 g/dL (ref 13.0–17.7)
Immature Grans (Abs): 0 x10E3/uL (ref 0.0–0.1)
Immature Granulocytes: 0 %
Lymphocytes Absolute: 2.1 x10E3/uL (ref 0.7–3.1)
Lymphs: 19 %
MCH: 27 pg (ref 26.6–33.0)
MCHC: 31.5 g/dL (ref 31.5–35.7)
MCV: 86 fL (ref 79–97)
Monocytes Absolute: 1.2 x10E3/uL — ABNORMAL HIGH (ref 0.1–0.9)
Monocytes: 11 %
Neutrophils Absolute: 7.6 x10E3/uL — ABNORMAL HIGH (ref 1.4–7.0)
Neutrophils: 67 %
Platelets: 208 x10E3/uL (ref 150–450)
RBC: 6.53 x10E6/uL — ABNORMAL HIGH (ref 4.14–5.80)
RDW: 17.1 % — ABNORMAL HIGH (ref 11.6–15.4)
WBC: 11.3 x10E3/uL — ABNORMAL HIGH (ref 3.4–10.8)

## 2024-07-09 LAB — BASIC METABOLIC PANEL WITH GFR
BUN/Creatinine Ratio: 14 (ref 10–24)
BUN: 19 mg/dL (ref 8–27)
CO2: 22 mmol/L (ref 20–29)
Calcium: 10.1 mg/dL (ref 8.6–10.2)
Chloride: 96 mmol/L (ref 96–106)
Creatinine, Ser: 1.33 mg/dL — ABNORMAL HIGH (ref 0.76–1.27)
Glucose: 91 mg/dL (ref 70–99)
Potassium: 4.9 mmol/L (ref 3.5–5.2)
Sodium: 136 mmol/L (ref 134–144)
eGFR: 53 mL/min/1.73 — ABNORMAL LOW (ref >59)

## 2024-07-09 NOTE — Telephone Encounter (Addendum)
 Cardiac Catheterization scheduled at [facility] for: Friday July 13, 2024 7:30 AM Arrival time [facility] Main Entrance A at: 5:30 AM  Diet: -Nothing to eat after midnight.  Hydration: -May drink clear liquids until 2 hours before the procedure.  Approved liquids: Water, clear tea, black coffee, fruit juices-non-citric and without pulp,Gatorade, plain Jello/popsicles.   -Please drink 16 oz of water 2 hours before procedure.  Medication instructions: -Hold:  Metformin -day of procedure and 48 hours after  Insulin /Jardiance-AM of procedure -Other usual morning medications can be taken including aspirin  81 mg and Plavix  75 mg  Plan to go home the same day, you will only stay overnight if medically necessary.  You must have responsible adult to drive you home.  Someone must be with you the first 24 hours after you arrive home.  Left message for patient to call back- pre-procedure BMP/CBC 07/09/24.  BMP/CBC done 07/09/24

## 2024-07-10 ENCOUNTER — Other Ambulatory Visit (HOSPITAL_COMMUNITY)

## 2024-07-13 ENCOUNTER — Ambulatory Visit (HOSPITAL_COMMUNITY)
Admission: RE | Admit: 2024-07-13 | Discharge: 2024-07-13 | Disposition: A | Attending: Cardiology | Admitting: Cardiology

## 2024-07-13 ENCOUNTER — Other Ambulatory Visit: Payer: Self-pay

## 2024-07-13 ENCOUNTER — Encounter (HOSPITAL_COMMUNITY): Admission: RE | Disposition: A | Payer: Self-pay | Source: Home / Self Care | Attending: Cardiology

## 2024-07-13 DIAGNOSIS — Z8249 Family history of ischemic heart disease and other diseases of the circulatory system: Secondary | ICD-10-CM | POA: Diagnosis not present

## 2024-07-13 DIAGNOSIS — I10 Essential (primary) hypertension: Secondary | ICD-10-CM | POA: Diagnosis not present

## 2024-07-13 DIAGNOSIS — R0609 Other forms of dyspnea: Secondary | ICD-10-CM | POA: Diagnosis not present

## 2024-07-13 DIAGNOSIS — I2510 Atherosclerotic heart disease of native coronary artery without angina pectoris: Secondary | ICD-10-CM | POA: Diagnosis not present

## 2024-07-13 DIAGNOSIS — I6523 Occlusion and stenosis of bilateral carotid arteries: Secondary | ICD-10-CM | POA: Diagnosis not present

## 2024-07-13 DIAGNOSIS — M1990 Unspecified osteoarthritis, unspecified site: Secondary | ICD-10-CM | POA: Diagnosis not present

## 2024-07-13 DIAGNOSIS — Z7902 Long term (current) use of antithrombotics/antiplatelets: Secondary | ICD-10-CM | POA: Diagnosis not present

## 2024-07-13 DIAGNOSIS — Z955 Presence of coronary angioplasty implant and graft: Secondary | ICD-10-CM | POA: Diagnosis not present

## 2024-07-13 DIAGNOSIS — Z79899 Other long term (current) drug therapy: Secondary | ICD-10-CM | POA: Diagnosis not present

## 2024-07-13 DIAGNOSIS — E1142 Type 2 diabetes mellitus with diabetic polyneuropathy: Secondary | ICD-10-CM | POA: Diagnosis not present

## 2024-07-13 DIAGNOSIS — Z7982 Long term (current) use of aspirin: Secondary | ICD-10-CM | POA: Diagnosis not present

## 2024-07-13 DIAGNOSIS — E785 Hyperlipidemia, unspecified: Secondary | ICD-10-CM | POA: Diagnosis not present

## 2024-07-13 HISTORY — PX: LEFT HEART CATH AND CORONARY ANGIOGRAPHY: CATH118249

## 2024-07-13 LAB — GLUCOSE, CAPILLARY: Glucose-Capillary: 162 mg/dL — ABNORMAL HIGH (ref 70–99)

## 2024-07-13 SURGERY — LEFT HEART CATH AND CORONARY ANGIOGRAPHY
Anesthesia: LOCAL

## 2024-07-13 MED ORDER — FREE WATER: 250.0000 mL | Freq: Once | Status: DC

## 2024-07-13 MED ORDER — ONDANSETRON HCL 4 MG/2ML IJ SOLN: 4.0000 mg | Freq: Four times a day (QID) | INTRAMUSCULAR | Status: DC | PRN

## 2024-07-13 MED ORDER — FREE WATER: 500.0000 mL | Freq: Once | Status: DC

## 2024-07-13 MED ORDER — SODIUM CHLORIDE 0.9% FLUSH: 3.0000 mL | Freq: Two times a day (BID) | INTRAVENOUS | Status: DC

## 2024-07-13 MED ORDER — LIDOCAINE HCL (PF) 1 % IJ SOLN
INTRAMUSCULAR | Status: DC | PRN
  Administered 2024-07-13: 2 mL

## 2024-07-13 MED ORDER — LIDOCAINE HCL (PF) 1 % IJ SOLN
INTRAMUSCULAR | Status: AC
  Filled 2024-07-13: qty 30

## 2024-07-13 MED ORDER — VERAPAMIL HCL 2.5 MG/ML IV SOLN
INTRAVENOUS | Status: AC
  Filled 2024-07-13: qty 2

## 2024-07-13 MED ORDER — HYDRALAZINE HCL 20 MG/ML IJ SOLN: 10.0000 mg | INTRAMUSCULAR | Status: DC | PRN

## 2024-07-13 MED ORDER — FENTANYL CITRATE (PF) 100 MCG/2ML IJ SOLN
INTRAMUSCULAR | Status: AC
  Filled 2024-07-13: qty 2

## 2024-07-13 MED ORDER — ASPIRIN 81 MG PO CHEW: 81.0000 mg | CHEWABLE_TABLET | ORAL | Status: DC

## 2024-07-13 MED ORDER — FUROSEMIDE 40 MG PO TABS: 40.0000 mg | ORAL_TABLET | Freq: Every day | ORAL | 11 refills | Status: AC

## 2024-07-13 MED ORDER — VERAPAMIL HCL 2.5 MG/ML IV SOLN
INTRAVENOUS | Status: DC | PRN
  Administered 2024-07-13: 10 mL via INTRA_ARTERIAL

## 2024-07-13 MED ORDER — FENTANYL CITRATE (PF) 100 MCG/2ML IJ SOLN
INTRAMUSCULAR | Status: DC | PRN
  Administered 2024-07-13: 25 ug via INTRAVENOUS

## 2024-07-13 MED ORDER — ACETAMINOPHEN 325 MG PO TABS: 650.0000 mg | ORAL_TABLET | ORAL | Status: DC | PRN

## 2024-07-13 MED ORDER — SODIUM CHLORIDE 0.9% FLUSH: 3.0000 mL | INTRAVENOUS | Status: DC | PRN

## 2024-07-13 MED ORDER — IOHEXOL 350 MG/ML SOLN
INTRAVENOUS | Status: DC | PRN
  Administered 2024-07-13: 40 mL

## 2024-07-13 MED ORDER — MIDAZOLAM HCL (PF) 2 MG/2ML IJ SOLN
INTRAMUSCULAR | Status: DC | PRN
  Administered 2024-07-13: 1 mg via INTRAVENOUS

## 2024-07-13 MED ORDER — HEPARIN SODIUM (PORCINE) 1000 UNIT/ML IJ SOLN
INTRAMUSCULAR | Status: AC
  Filled 2024-07-13: qty 10

## 2024-07-13 MED ORDER — FUROSEMIDE 10 MG/ML IJ SOLN
INTRAMUSCULAR | Status: DC | PRN
  Administered 2024-07-13: 20 mg via INTRAVENOUS

## 2024-07-13 MED ORDER — FUROSEMIDE 10 MG/ML IJ SOLN
INTRAMUSCULAR | Status: AC
  Filled 2024-07-13: qty 4

## 2024-07-13 MED ORDER — METFORMIN HCL 1000 MG PO TABS: 1000.0000 mg | ORAL_TABLET | Freq: Two times a day (BID) | ORAL | Status: AC

## 2024-07-13 MED ORDER — HEPARIN (PORCINE) IN NACL 1000-0.9 UT/500ML-% IV SOLN
INTRAVENOUS | Status: DC | PRN
  Administered 2024-07-13: 1000 mL via SURGICAL_CAVITY

## 2024-07-13 MED ORDER — SODIUM CHLORIDE 0.9 % IV SOLN: 250.0000 mL | INTRAVENOUS | Status: DC | PRN

## 2024-07-13 MED ORDER — MIDAZOLAM HCL 2 MG/2ML IJ SOLN
INTRAMUSCULAR | Status: AC
  Filled 2024-07-13: qty 2

## 2024-07-13 MED ORDER — HEPARIN SODIUM (PORCINE) 1000 UNIT/ML IJ SOLN
INTRAMUSCULAR | Status: DC | PRN
  Administered 2024-07-13: 5000 [IU] via INTRAVENOUS

## 2024-07-13 SURGICAL SUPPLY — 9 items
CATH INFINITI 5FR MULTPACK ANG (CATHETERS) IMPLANT
DEVICE RAD COMP TR BAND LRG (VASCULAR PRODUCTS) IMPLANT
ELECT DEFIB PAD ADLT CADENCE (PAD) IMPLANT
GLIDESHEATH SLEND SS 6F .021 (SHEATH) IMPLANT
GUIDEWIRE INQWIRE 1.5J.035X260 (WIRE) IMPLANT
KIT SYRINGE INJ CVI SPIKEX1 (MISCELLANEOUS) IMPLANT
PACK CARDIAC CATHETERIZATION (CUSTOM PROCEDURE TRAY) ×1 IMPLANT
SET ATX-X65L (MISCELLANEOUS) IMPLANT
SHEATH PROBE COVER 6X72 (BAG) IMPLANT

## 2024-07-13 NOTE — Progress Notes (Signed)
 Discharge instructions reviewed with patient and spouse at bedside. Denies questions concerns. PT was able to void without difficulty. Incision site remains clean dry and intact. No s/s of complications. PT escorted from the unit via wheel chair to personal vehicle.

## 2024-07-13 NOTE — H&P (Signed)
 Expand All Collapse All   Cardiology Office Note    Date:  06/13/2024  ID:  Seth Young, DOB August 24, 1940, MRN [id number] PCP: Seth Ade, MD  [facility] Providers Cardiologist:  Seth Parchment, MD      [facility] Carotid artery disease S/p left carotid endarterectomy 2016 Coronary artery disease S/p DES to mLAD 2019 Hypertension Hyperlipidemia Type 2 diabetes   History as outlined above.  He has maintained consistent follow-up.  Last cardiac catheterization 03/2018 revealed diffuse triple-vessel CAD, RCA small caliber nondominant vessel with diffuse severe stenosis in small PDA and RV marginal branch, too small for PCI.  Circumflex has moderate proximal and mid stenosis, obtuse marginal stent is patent without restenosis LAD is large caliber vessel with proximal LAD diffuse moderate disease but no focally obstructive lesions, mid LAD successfully treated with PTCA/DES x 1, small caliber first diagonal with diffuse severe disease not large enough for PCI.  He was placed on aspirin  and Plavix .   Last cardiology clinic visit was 04/28/2023 with Dr. Parchment.  He remained on GDMT for CAD including aspirin , atorvastatin , Imdur , Plavix , and metoprolol .  He reported difficulty managing glucose levels and was working with a home health nurse for better management.  BP was well-controlled at that time.   Carotid duplex 04/29/2023 with mild right carotid stenosis, patent left endarterectomy, stable.    History of Present Illness Discussed the use of AI scribe software for clinical note transcription with the patient, who gave verbal consent to proceed.   History of Present Illness Seth Young is a very pleasant 83 year old male with coronary artery disease who presents for evaluation of limitations in physical activity and shortness of breath. He experiences shortness of breath during physical activities, particularly while walking and climbing stairs. He describes occasional chest  discomfort, occurring about three times in the past six months, which is relieved by nitroglycerin . He denies chest pain that occurs each time he exerts himself and is not having any episodes of presyncope, syncope, palpitations, orthopnea, PND, or edema. He feels well on his medication regimen including metoprolol , isosorbide , aspirin , Plavix , and lisinopril . We reviewed findings from previous catheterizations in 2017 and 2019 in detail. He has a family history of heart disease, with his father having died from a heart attack. He engages in physical activity with his grandson at least 2 days per week, including stationary biking, walking with a walker, weights and stretches. He occasionally notes right foot swelling and has chronic knee and back pain.      ROS: See HPI   Studies Reviewed EKG Interpretation Date/Time:                  Wednesday June 13 2024 14:03:42 EST Ventricular Rate:         67 PR Interval:                 244 QRS Duration:             90 QT Interval:                 394 QTC Calculation:416 R Axis:                         -8   Text Interpretation:Sinus rhythm with sinus arrhythmia with 1st degree A-V block Low voltage QRS Cannot rule out Inferior infarct , age undetermined Cannot rule out Anterior infarct , age undetermined When compared with ECG of 28-Apr-2023  11:00, Minimal criteria for Anterior infarct are now Present No significant change was found Confirmed by Percy Browning [phone]) on 06/13/2024 2:40:06 PM       Last Labs  No results found for: LIPOA     Risk Assessment/Calculations           Physical Exam VS:  BP 120/68   Pulse 66   Ht 5' 11.5 (1.816 m)   Wt 249 lb (112.9 kg)   SpO2 97%   BMI 34.24 kg/m       Wt Readings from Last 3 Encounters:  06/13/24 249 lb (112.9 kg)  04/28/23 246 lb (111.6 kg)  04/02/22 246 lb (111.6 kg)    GEN: Well nourished, well developed in no acute distress NECK: No JVD; No carotid bruits CARDIAC: RRR, no  murmurs, rubs, gallops RESPIRATORY:  Clear to auscultation without rales, wheezing or rhonchi  ABDOMEN: Soft, non-tender, non-distended EXTREMITIES:  No edema; No deformity    Assessment & Plan Coronary artery disease  DOE Chest pain He experiences dyspnea on exertion, particularly with climbing stairs. He walks around his home with a walker but has not been traveling far distances due to limitations including knee and back pain. Uses stationary bike and does walking, stretching and weighted exercises with his grandson at least 2 days per week. At least 3 episodes of chest pain relieved by nitroglycerin  over the past 3 months. No orthopnea or PND. Occasionally the toes on his right foot swell. EKG today indicates a possible anterior infarct with subtle changes from last EKG 04/2023. Cardiac cath in 2019 showed a stent in the mid LAD, moderate circumflex disease, and right coronary artery disease too small for stenting.  We discussed possible up-titration of GDMT, however BP is soft. Due to occasional chest pain and DOE, we will get ischemia evaluation given time since last cath and possible worsening stenosis. - Get cardiac PET CT for evaluation of ischemia - Continue metoprolol , isosorbide , aspirin , amlodipine , Plavix , and lisinopril    First degree atrioventricular (AV) block   EKG shows first degree AV block at 67 bpm. Has been present since at least 2019. No lightheadedness, presyncope or syncope. Advised that metoprolol  may lower HR but since he is asymptomatic we will continue to monitor for now.  - Notify us  if symptoms develop  - Continue metoprolol      Hyperlipidemia LDL goal < 55 Diabetes Lipid panel completed 12/19/23 with total cholesterol 111, HDL 35, triglycerides 103, and LDL 55. Lipids are at goal. A1C 6.5% on 12/19/23. He generally eats small meals or fasts until dinner. We discussed heart healthy diet avoiding saturated fat, processed food, sugar and other simple carbohydrates and  the importance of avoiding prolonged fasting for glucose stability. Recommend regular walking around at home to avoid prolonged sitting.  - Management of diabetes per PCP - Continue atorvastatin  - Continue to be physically active as often as possible throughout the day along with moderate intensity exercise for goal of 150 minutes each week - Heart healthy diet    Lower extremity osteoarthritis   Right foot swelling  Peripheral neuropathy He reports limitations in activity, particularly long distance travel due to knee and back pain. He uses compression socks during workouts. He engages in exercise at least 2 days per week with his grandson. Uses Lyrica  and Tylenol  for pain associated with peripheral neuropathy. Notes toes swelling occasionally on the right foot. No orthopnea or PND associated.  - Recommend continued compression and medication for management - Maintain consistent physical  activity to avoid prolonged periods of sitting - Maintain consistent follow-up and management of diabetes        Informed Consent Shared Decision Making/Informed Consent The risks [chest pain, shortness of breath, cardiac arrhythmias, dizziness, blood pressure fluctuations, myocardial infarction, stroke/transient ischemic attack, nausea, vomiting, allergic reaction, radiation exposure, metallic taste sensation and life-threatening complications (estimated to be 1 in 10,000)], benefits (risk stratification, diagnosing coronary artery disease, treatment guidance) and alternatives of a cardiac PET stress test were discussed in detail with Seth Young and he agrees to proceed.      Disposition: 3 months with me   Signed, Rosaline Bane, NP-C   Patient seen and examined. Reviewed all prior studies including prior cardiac cath and recent stress test findings. Agree that cardiac cath indicated with potential need for PCI. Discussed fully with patient     Karilynn Carranza MD, FACC 07/13/2024 7:10 AM

## 2024-07-15 ENCOUNTER — Encounter (HOSPITAL_COMMUNITY): Payer: Self-pay | Admitting: Cardiology

## 2024-07-16 NOTE — Progress Notes (Unsigned)
 Cardiology Office Note   Date:  07/23/2024  ID:  Seth Young, Seth Young [birth year], MRN [id number] PCP: Shepard Ade, MD  [facility] Providers Cardiologist:  Oneil Parchment, MD     [facility] Carotid artery disease S/p left carotid endarterectomy 2016 Coronary artery disease S/p DES to mLAD 2019 Hypertension Hyperlipidemia Type 2 diabetes  History as outlined above. Has maintained consistent follow-up. Cardiac catheterization 03/2018 revealed diffuse triple-vessel CAD, RCA small caliber nondominant vessel with diffuse severe stenosis in small PDA and RV marginal branch, too small for PCI.  Circumflex has moderate proximal and mid stenosis, obtuse marginal stent is patent without restenosis LAD is large caliber vessel with proximal LAD diffuse moderate disease but no focally obstructive lesions, mid LAD successfully treated with PTCA/DES x 1, small caliber first diagonal with diffuse severe disease not large enough for PCI.  He was placed on aspirin  and Plavix .  Seen in clinic 04/28/2023 by Dr. Parchment.  He remained on GDMT for CAD including aspirin , atorvastatin , Imdur , Plavix , and metoprolol .  He reported difficulty managing glucose levels and was working with a home health nurse for better management.  BP was well-controlled at that time.  Carotid duplex 04/29/2023 with mild right carotid stenosis, patent left endarterectomy, stable.   Seen by me on 06/13/24 for evaluation of limitations in physical activity and shortness of breath. DOE during physical activities, particularly while walking and climbing stairs. Occasional chest discomfort, occurring about three times in the past six months, which is relieved by nitroglycerin . No chest pain that occurs each time he exerts himself and no presyncope, syncope, palpitations, orthopnea, PND. We reviewed findings from previous catheterizations in 2017 and 2019 in detail. Family history of heart disease, with his father having died from a heart attack.  Engaged in physical activity with his grandson at least 2 days per week, including stationary biking, walking with a walker, weights and stretches. He occasionally notes right foot swelling and has chronic knee and back pain.   Cardiac PET CT was concerning for new or worsening ischemia, so he underwent repeat LHC on 07/13/24 which revealed severe small vessel disease in first diagonal and nondominant RCA, patent stents in mLAD and dCx, no significant change from previous study.  He had elevated LVEDP and was given Lasix  IV as well as advised to start Lasix  40 mg daily.  History of Present Illness Discussed the use of AI scribe software for clinical note transcription with the patient, who gave verbal consent to proceed.  History of Present Illness Seth Young is a very pleasant 83 year old male with coronary artery disease who presents    Seth Young is an 83 year old male who presents for follow-up regarding diuretic therapy and associated symptoms.  He reports prior shortness of breath and fluid retention that have improved with daily Lasix , with good urine output. His wife has also noticed improvement in his breathing.  He notes intermittent right foot swelling and bruising of the toes that is improving, with unclear cause.  He takes Lasix  daily along with Jardiance, metoprolol , lisinopril , and isosorbide .  He has elevated creatinine that is being monitored in the setting of diuretic use.  He denies chest pain, blood pressure problems, dizziness, abdominal tightness, or discomfort when reclining. He notes a spot on his foot but does not associate it with cardiac symptoms.   ROS: See HPI  Studies Reviewed EKG Interpretation Date/Time:  Monday July 23 2024 13:42:13 EST Ventricular Rate:  75 PR Interval:  246 QRS Duration:  96 QT Interval:  366 QTC Calculation: 408 R Axis:   0  Text Interpretation: Sinus rhythm with sinus arrhythmia with 1st degree A-V block Low voltage  QRS Inferior infarct (cited on or before 13-Jun-2024) When compared with ECG of 13-Jun-2024 14:03, Minimal criteria for Anterior infarct are no longer Present Confirmed by Percy Browning [phone]) on 07/23/2024 1:46:40 PM     No results found for: LIPOA  Risk Assessment/Calculations           Physical Exam VS:  BP 126/70 (BP Location: Right Arm, Patient Position: Sitting, Cuff Size: Normal)   Pulse 75   Ht 5' 11.5 (1.816 m)   Wt 239 lb (108.4 kg)   SpO2 99%   BMI 32.87 kg/m    Wt Readings from Last 3 Encounters:  07/23/24 239 lb (108.4 kg)  07/13/24 249 lb (112.9 kg)  06/13/24 249 lb (112.9 kg)    GEN: Well nourished, well developed in no acute distress NECK: No JVD; No carotid bruits CARDIAC: RRR, no murmurs, rubs, gallops RESPIRATORY:  Clear to auscultation without rales, wheezing or rhonchi  ABDOMEN: Soft, non-tender, non-distended EXTREMITIES:  No edema; No deformity   Assessment & Plan Coronary artery disease  DOE Chest pain Cardiac PET CT due to worsening DOE, chest pain. CT 06/27/24 revealed concern for reversible apical to mid inferior perfusion defect consistent with ischemia. LHC 07/13/24 revealed severe small vessel disease involving the first diagonal and nondominant RCA, patent stents in the mid LAD and distal LCx, no significant change from previous study, high LVEDP at 30 mmHg.  He was given IV Lasix  and advised to start Lasix  40 mg daily. Able to walk at home with a walker but long distances limited 2/2 knee and back pain. Uses stationary bike and does walking, stretching and weighted exercises with his grandson at least 2 days per week. At least 3 episodes of chest pain relieved by nitroglycerin  over the past 3 months. No orthopnea or PND. Occasionally the toes on his right foot swell. EKG today indicates a possible anterior infarct with subtle changes from last EKG 04/2023. Cardiac cath in 2019 showed a stent in the mid LAD, moderate circumflex disease, and right  coronary artery disease too small for stenting.  We discussed possible up-titration of GDMT, however BP is soft. Due to occasional chest pain and DOE, we will get ischemia evaluation given time since last cath and possible worsening stenosis. - Get cardiac PET CT for evaluation of ischemia - Continue metoprolol , isosorbide , aspirin , amlodipine , Plavix , and lisinopril   Heart failure Elevated LVEDP of 30 mmHg on cath 07/13/24 Cardiac PET CT reveals rest EF 54%, stress EF 51%.   First degree atrioventricular (AV) block   EKG shows first degree AV block at 67 bpm. Has been present since at least 2019. No lightheadedness, presyncope or syncope. Advised that metoprolol  may lower HR but since he is asymptomatic we will continue to monitor for now.  - Notify us  if symptoms develop  - Continue metoprolol    Hyperlipidemia LDL goal < 55 Diabetes Lipid panel completed 12/19/23 with total cholesterol 111, HDL 35, triglycerides 103, and LDL 55. Lipids are at goal. A1C 6.5% on 12/19/23. He generally eats small meals or fasts until dinner. We discussed heart healthy diet avoiding saturated fat, processed food, sugar and other simple carbohydrates and the importance of avoiding prolonged fasting for glucose stability. Recommend regular walking around at home to avoid prolonged sitting.  - Management of diabetes per PCP -  Continue atorvastatin  - Continue to be physically active as often as possible throughout the day along with moderate intensity exercise for goal of 150 minutes each week - Heart healthy diet   Lower extremity osteoarthritis   Right foot swelling  Peripheral neuropathy He reports limitations in activity, particularly long distance travel due to knee and back pain. He uses compression socks during workouts. He engages in exercise at least 2 days per week with his grandson. Uses Lyrica  and Tylenol  for pain associated with peripheral neuropathy. Notes toes swelling occasionally on the right foot.  No orthopnea or PND associated.  - Recommend continued compression and medication for management - Maintain consistent physical activity to avoid prolonged periods of sitting - Maintain consistent follow-up and management of diabetes   Coronary artery disease with stable angina   He has no current chest pain. Previous catheterization showed normal heart function. EKG showed concerning findings but he is asymptomatic. Continue Jardiance, metoprolol , lisinopril , and isosorbide .  Fluid overload   Lasix  is effective in managing symptoms. Current creatinine level is 1.3 and requires monitoring. Kidney function was checked today. Continue Lasix  daily unless kidney function worsens. Monitor for increased shortness of breath or swelling. Advised to skip Lasix  if going out all day, but monitor for symptoms.  First degree atrioventricular block   He has no current symptoms. Previous EKG showed concerning findings but no current issues. Continue monitoring.  Hyperlipidemia   LDL was 55 in May and is well controlled. Schedule cholesterol testing at next follow-up with primary care physician.  Tinnitus   He has been experiencing ringing in his ears for about a month. Possible causes include medication side effects, age, vertigo, or hearing loss. Lasix  and aspirin  are potential contributors. Consider referral to ear, nose, and throat specialist if symptoms persist. Monitor for changes in symptoms.          Disposition: ***  Signed, Rosaline Bane, NP-C

## 2024-07-23 ENCOUNTER — Ambulatory Visit (INDEPENDENT_AMBULATORY_CARE_PROVIDER_SITE_OTHER): Admitting: Nurse Practitioner

## 2024-07-23 ENCOUNTER — Encounter (HOSPITAL_BASED_OUTPATIENT_CLINIC_OR_DEPARTMENT_OTHER): Payer: Self-pay | Admitting: Nurse Practitioner

## 2024-07-23 VITALS — BP 126/70 | HR 75 | Ht 71.5 in | Wt 239.0 lb

## 2024-07-23 DIAGNOSIS — I25118 Atherosclerotic heart disease of native coronary artery with other forms of angina pectoris: Secondary | ICD-10-CM

## 2024-07-23 DIAGNOSIS — I5032 Chronic diastolic (congestive) heart failure: Secondary | ICD-10-CM

## 2024-07-23 DIAGNOSIS — I440 Atrioventricular block, first degree: Secondary | ICD-10-CM | POA: Diagnosis not present

## 2024-07-23 DIAGNOSIS — E785 Hyperlipidemia, unspecified: Secondary | ICD-10-CM

## 2024-07-23 DIAGNOSIS — Z5181 Encounter for therapeutic drug level monitoring: Secondary | ICD-10-CM | POA: Diagnosis not present

## 2024-07-23 DIAGNOSIS — H9313 Tinnitus, bilateral: Secondary | ICD-10-CM

## 2024-07-23 NOTE — Patient Instructions (Addendum)
 Medication Instructions:  Your physician recommends that you continue on your current medications as directed. Please refer to the Current Medication list given to you today.  *If you need a refill on your cardiac medications before your next appointment, please call your pharmacy*  Lab Work: TODAY - BMET If you have labs (blood work) drawn today and your tests are completely normal, you will receive your results only by: MyChart Message (if you have MyChart) OR A paper copy in the mail If you have any lab test that is abnormal or we need to change your treatment, we will call you to review the results.  Testing/Procedures: None Ordered  Follow-Up: 10/25/2024 at 11:20 am with Rosaline RAMAN NP   We recommend signing up for the patient portal called MyChart.  Sign up information is provided on this After Visit Summary.  MyChart is used to connect with patients for Virtual Visits (Telemedicine).  Patients are able to view lab/test results, encounter notes, upcoming appointments, etc.  Non-urgent messages can be sent to your provider as well.   To learn more about what you can do with MyChart, go to [email or web address].   Other Instructions Adopting a Healthy Lifestyle.   Weight: Know what a healthy weight is for you (roughly BMI <25) and aim to maintain this. You can calculate your body mass index on your smart phone. Unfortunately, this is not the most accurate measure of healthy weight, but it is the simplest measurement to use. A more accurate measurement involves body scanning which measures lean muscle, fat tissue and bony density. We do not have this equipment at [facility].    Diet: Aim for 7+ servings of fruits and vegetables daily Limit animal fats in diet for cholesterol and heart health - choose grass fed whenever available Avoid highly processed foods (fast food burgers, tacos, fried chicken, pizza, hot dogs, french fries)  Saturated fat comes in the form of butter, lard,  coconut oil, margarine, partially hydrogenated oils, dairy products, and fat in meat. These increase your risk of cardiovascular disease.  Use healthy plant oils, such as olive, canola, soy, corn, sunflower and peanut.  Whole foods such as fruits, vegetables and whole grains have fiber  Men need > 38 grams of fiber per day Women need > 25 grams of fiber per day  Load up on vegetables and fruits - one-half of your plate: Aim for color and variety, and remember that potatoes dont count. Go for whole grains - one-quarter of your plate: Whole wheat, barley, wheat berries, quinoa, oats, brown rice, and foods made with them. If you want pasta, go with whole wheat pasta. Protein power - one-quarter of your plate: Fish, chicken, beans, and nuts are all healthy, versatile protein sources. Limit red meat. You need carbohydrates for energy! The type of carbohydrate is more important than the amount. Choose carbohydrates such as vegetables, fruits, whole grains, beans, and nuts in the place of white rice, white pasta, potatoes (baked or fried), macaroni and cheese, cakes, cookies, and donuts.  If youre thirsty, drink water . Coffee and tea are good in moderation, but skip sugary drinks and limit milk and dairy products to one or two daily servings. Keep sugar intake at 6 teaspoons or 24 grams or LESS       Exercise: Aim for 150 min of moderate intensity exercise weekly for heart health, and weights twice weekly for bone health Stay active - any steps are better than no steps! Aim for 7-9 hours of sleep daily  Sleep: This provides your body with the reset and relaxation that it needs!  Aim to get 7-8 hours of sleep each night. Limit caffeine, screen time, and other distractions prior to bedtime.  Keep your bedroom cool and dark and do not wear heavy clothing to bed or use heavy bed covers - layer if needed.

## 2024-07-24 ENCOUNTER — Ambulatory Visit (HOSPITAL_BASED_OUTPATIENT_CLINIC_OR_DEPARTMENT_OTHER): Payer: Self-pay | Admitting: Nurse Practitioner

## 2024-07-24 ENCOUNTER — Encounter (HOSPITAL_BASED_OUTPATIENT_CLINIC_OR_DEPARTMENT_OTHER): Payer: Self-pay | Admitting: Nurse Practitioner

## 2024-07-24 LAB — BASIC METABOLIC PANEL WITH GFR
BUN/Creatinine Ratio: 11 (ref 10–24)
BUN: 16 mg/dL (ref 8–27)
CO2: 23 mmol/L (ref 20–29)
Calcium: 10.8 mg/dL — ABNORMAL HIGH (ref 8.6–10.2)
Chloride: 94 mmol/L — ABNORMAL LOW (ref 96–106)
Creatinine, Ser: 1.51 mg/dL — ABNORMAL HIGH (ref 0.76–1.27)
Glucose: 84 mg/dL (ref 70–99)
Potassium: 4.8 mmol/L (ref 3.5–5.2)
Sodium: 137 mmol/L (ref 134–144)
eGFR: 46 mL/min/1.73 — ABNORMAL LOW (ref >59)

## 2024-09-17 ENCOUNTER — Ambulatory Visit (HOSPITAL_BASED_OUTPATIENT_CLINIC_OR_DEPARTMENT_OTHER): Admitting: Nurse Practitioner

## 2024-10-25 ENCOUNTER — Ambulatory Visit (HOSPITAL_BASED_OUTPATIENT_CLINIC_OR_DEPARTMENT_OTHER): Admitting: Nurse Practitioner
